# Patient Record
Sex: Female | Born: 1945 | Race: White | Hispanic: No | Smoking: Former smoker
Health system: Southern US, Community
[De-identification: ages and names within clinical notes are randomized; demographics above are authoritative.]

## PROBLEM LIST (undated history)

## (undated) DIAGNOSIS — C50919 Malignant neoplasm of unspecified site of unspecified female breast: Secondary | ICD-10-CM

## (undated) DIAGNOSIS — C3490 Malignant neoplasm of unspecified part of unspecified bronchus or lung: Secondary | ICD-10-CM

## (undated) DIAGNOSIS — C719 Malignant neoplasm of brain, unspecified: Secondary | ICD-10-CM

## (undated) DIAGNOSIS — F419 Anxiety disorder, unspecified: Secondary | ICD-10-CM

## (undated) HISTORY — PX: TONSILLECTOMY: SUR1361

## (undated) HISTORY — PX: EYE SURGERY: SHX253

## (undated) HISTORY — PX: COSMETIC SURGERY: SHX468

## (undated) HISTORY — PX: BREAST SURGERY: SHX581

## (undated) HISTORY — DX: Malignant neoplasm of unspecified site of unspecified female breast: C50.919

---

## 1978-04-25 HISTORY — PX: OTHER SURGICAL HISTORY: SHX169

## 1996-04-25 HISTORY — PX: DILATION AND CURETTAGE OF UTERUS: SHX78

## 1997-04-25 HISTORY — PX: FACELIFT W/BLEPHAROPLASTY: SHX1568

## 1999-12-13 ENCOUNTER — Other Ambulatory Visit: Admission: RE | Admit: 1999-12-13 | Discharge: 1999-12-13 | Payer: Self-pay | Admitting: Obstetrics and Gynecology

## 2000-11-16 ENCOUNTER — Encounter: Payer: Self-pay | Admitting: Obstetrics and Gynecology

## 2000-11-16 ENCOUNTER — Encounter: Admission: RE | Admit: 2000-11-16 | Discharge: 2000-11-16 | Payer: Self-pay | Admitting: Obstetrics and Gynecology

## 2000-12-12 ENCOUNTER — Other Ambulatory Visit: Admission: RE | Admit: 2000-12-12 | Discharge: 2000-12-12 | Payer: Self-pay | Admitting: Obstetrics and Gynecology

## 2001-12-25 ENCOUNTER — Other Ambulatory Visit: Admission: RE | Admit: 2001-12-25 | Discharge: 2001-12-25 | Payer: Self-pay | Admitting: Obstetrics and Gynecology

## 2002-04-09 ENCOUNTER — Encounter: Payer: Self-pay | Admitting: Obstetrics and Gynecology

## 2002-04-09 ENCOUNTER — Encounter: Admission: RE | Admit: 2002-04-09 | Discharge: 2002-04-09 | Payer: Self-pay | Admitting: Obstetrics and Gynecology

## 2003-01-08 ENCOUNTER — Other Ambulatory Visit: Admission: RE | Admit: 2003-01-08 | Discharge: 2003-01-08 | Payer: Self-pay | Admitting: Obstetrics and Gynecology

## 2004-03-23 ENCOUNTER — Other Ambulatory Visit: Admission: RE | Admit: 2004-03-23 | Discharge: 2004-03-23 | Payer: Self-pay | Admitting: Obstetrics and Gynecology

## 2005-04-25 HISTORY — PX: OTHER SURGICAL HISTORY: SHX169

## 2005-04-28 ENCOUNTER — Other Ambulatory Visit: Admission: RE | Admit: 2005-04-28 | Discharge: 2005-04-28 | Payer: Self-pay | Admitting: *Deleted

## 2005-06-08 ENCOUNTER — Encounter: Admission: RE | Admit: 2005-06-08 | Discharge: 2005-06-08 | Payer: Self-pay | Admitting: Obstetrics and Gynecology

## 2006-06-07 ENCOUNTER — Other Ambulatory Visit: Admission: RE | Admit: 2006-06-07 | Discharge: 2006-06-07 | Payer: Self-pay | Admitting: Obstetrics & Gynecology

## 2007-06-11 ENCOUNTER — Other Ambulatory Visit: Admission: RE | Admit: 2007-06-11 | Discharge: 2007-06-11 | Payer: Self-pay | Admitting: Obstetrics & Gynecology

## 2008-06-09 ENCOUNTER — Other Ambulatory Visit: Admission: RE | Admit: 2008-06-09 | Discharge: 2008-06-09 | Payer: Self-pay | Admitting: Obstetrics & Gynecology

## 2008-06-17 ENCOUNTER — Encounter: Admission: RE | Admit: 2008-06-17 | Discharge: 2008-06-17 | Payer: Self-pay | Admitting: Obstetrics & Gynecology

## 2010-08-26 ENCOUNTER — Other Ambulatory Visit: Payer: Self-pay | Admitting: Obstetrics & Gynecology

## 2010-08-26 DIAGNOSIS — Z780 Asymptomatic menopausal state: Secondary | ICD-10-CM

## 2010-08-26 DIAGNOSIS — Z1231 Encounter for screening mammogram for malignant neoplasm of breast: Secondary | ICD-10-CM

## 2010-09-10 ENCOUNTER — Ambulatory Visit
Admission: RE | Admit: 2010-09-10 | Discharge: 2010-09-10 | Disposition: A | Discharge status: Home / Self Care | Payer: 59 | Source: Ambulatory Visit | Attending: Obstetrics & Gynecology | Admitting: Obstetrics & Gynecology

## 2010-09-10 DIAGNOSIS — Z1231 Encounter for screening mammogram for malignant neoplasm of breast: Secondary | ICD-10-CM

## 2010-09-10 DIAGNOSIS — Z780 Asymptomatic menopausal state: Secondary | ICD-10-CM

## 2010-09-15 ENCOUNTER — Other Ambulatory Visit: Payer: Self-pay | Admitting: Obstetrics & Gynecology

## 2010-09-15 DIAGNOSIS — R928 Other abnormal and inconclusive findings on diagnostic imaging of breast: Secondary | ICD-10-CM

## 2010-09-21 ENCOUNTER — Other Ambulatory Visit: Payer: Self-pay | Admitting: Obstetrics & Gynecology

## 2010-09-21 ENCOUNTER — Ambulatory Visit
Admission: RE | Admit: 2010-09-21 | Discharge: 2010-09-21 | Disposition: A | Discharge status: Home / Self Care | Payer: 59 | Source: Ambulatory Visit | Attending: Obstetrics & Gynecology | Admitting: Obstetrics & Gynecology

## 2010-09-21 DIAGNOSIS — R928 Other abnormal and inconclusive findings on diagnostic imaging of breast: Secondary | ICD-10-CM

## 2010-09-24 ENCOUNTER — Ambulatory Visit
Admission: RE | Admit: 2010-09-24 | Discharge: 2010-09-24 | Disposition: A | Discharge status: Home / Self Care | Payer: 59 | Source: Ambulatory Visit | Attending: Obstetrics & Gynecology | Admitting: Obstetrics & Gynecology

## 2010-09-24 ENCOUNTER — Other Ambulatory Visit: Payer: Self-pay | Admitting: Diagnostic Radiology

## 2010-09-24 ENCOUNTER — Other Ambulatory Visit: Payer: Self-pay | Admitting: Obstetrics & Gynecology

## 2010-09-24 DIAGNOSIS — R928 Other abnormal and inconclusive findings on diagnostic imaging of breast: Secondary | ICD-10-CM

## 2010-10-13 ENCOUNTER — Encounter (INDEPENDENT_AMBULATORY_CARE_PROVIDER_SITE_OTHER): Payer: Self-pay | Admitting: General Surgery

## 2011-03-23 ENCOUNTER — Other Ambulatory Visit: Payer: Self-pay | Admitting: Obstetrics & Gynecology

## 2011-03-23 DIAGNOSIS — N63 Unspecified lump in unspecified breast: Secondary | ICD-10-CM

## 2011-04-06 ENCOUNTER — Other Ambulatory Visit: Payer: Self-pay | Admitting: Obstetrics & Gynecology

## 2011-04-06 ENCOUNTER — Ambulatory Visit
Admission: RE | Admit: 2011-04-06 | Discharge: 2011-04-06 | Disposition: A | Discharge status: Home / Self Care | Payer: 59 | Source: Ambulatory Visit | Attending: Obstetrics & Gynecology | Admitting: Obstetrics & Gynecology

## 2011-04-06 DIAGNOSIS — N63 Unspecified lump in unspecified breast: Secondary | ICD-10-CM

## 2011-05-03 ENCOUNTER — Ambulatory Visit
Admission: RE | Admit: 2011-05-03 | Discharge: 2011-05-03 | Disposition: A | Discharge status: Home / Self Care | Payer: 59 | Source: Ambulatory Visit | Attending: Obstetrics & Gynecology | Admitting: Obstetrics & Gynecology

## 2011-05-03 ENCOUNTER — Other Ambulatory Visit: Payer: Self-pay | Admitting: Obstetrics & Gynecology

## 2011-05-03 DIAGNOSIS — N63 Unspecified lump in unspecified breast: Secondary | ICD-10-CM

## 2011-05-04 ENCOUNTER — Other Ambulatory Visit: Payer: Self-pay | Admitting: Obstetrics & Gynecology

## 2011-05-04 DIAGNOSIS — C50912 Malignant neoplasm of unspecified site of left female breast: Secondary | ICD-10-CM

## 2011-05-04 DIAGNOSIS — N6489 Other specified disorders of breast: Secondary | ICD-10-CM

## 2011-05-05 ENCOUNTER — Other Ambulatory Visit: Payer: Self-pay | Admitting: *Deleted

## 2011-05-05 ENCOUNTER — Telehealth: Payer: Self-pay | Admitting: *Deleted

## 2011-05-05 DIAGNOSIS — C50412 Malignant neoplasm of upper-outer quadrant of left female breast: Secondary | ICD-10-CM | POA: Insufficient documentation

## 2011-05-05 DIAGNOSIS — C50419 Malignant neoplasm of upper-outer quadrant of unspecified female breast: Secondary | ICD-10-CM

## 2011-05-05 NOTE — Telephone Encounter (Signed)
Confirmed [facility] for 05/11/11 at 1215 .  Instructions and contact information given.  

## 2011-05-06 ENCOUNTER — Encounter (INDEPENDENT_AMBULATORY_CARE_PROVIDER_SITE_OTHER): Payer: Self-pay | Admitting: General Surgery

## 2011-05-09 ENCOUNTER — Other Ambulatory Visit: Payer: 59

## 2011-05-11 ENCOUNTER — Encounter: Payer: Self-pay | Admitting: Oncology

## 2011-05-11 ENCOUNTER — Other Ambulatory Visit (HOSPITAL_BASED_OUTPATIENT_CLINIC_OR_DEPARTMENT_OTHER): Payer: 59 | Admitting: Lab

## 2011-05-11 ENCOUNTER — Encounter (INDEPENDENT_AMBULATORY_CARE_PROVIDER_SITE_OTHER): Payer: Self-pay | Admitting: General Surgery

## 2011-05-11 ENCOUNTER — Ambulatory Visit: Payer: 59

## 2011-05-11 ENCOUNTER — Ambulatory Visit (HOSPITAL_BASED_OUTPATIENT_CLINIC_OR_DEPARTMENT_OTHER): Payer: 59 | Admitting: General Surgery

## 2011-05-11 ENCOUNTER — Ambulatory Visit
Admission: RE | Admit: 2011-05-11 | Discharge: 2011-05-11 | Disposition: A | Discharge status: Home / Self Care | Payer: 59 | Source: Ambulatory Visit | Attending: Radiation Oncology | Admitting: Radiation Oncology

## 2011-05-11 ENCOUNTER — Ambulatory Visit (HOSPITAL_BASED_OUTPATIENT_CLINIC_OR_DEPARTMENT_OTHER): Payer: 59 | Admitting: Oncology

## 2011-05-11 ENCOUNTER — Ambulatory Visit: Payer: 59 | Attending: General Surgery | Admitting: Physical Therapy

## 2011-05-11 VITALS — BP 160/93 | HR 76 | Temp 98.2°F | Ht 69.0 in | Wt 176.6 lb

## 2011-05-11 DIAGNOSIS — C50419 Malignant neoplasm of upper-outer quadrant of unspecified female breast: Secondary | ICD-10-CM

## 2011-05-11 DIAGNOSIS — Z170 Estrogen receptor positive status [ER+]: Secondary | ICD-10-CM

## 2011-05-11 DIAGNOSIS — IMO0001 Reserved for inherently not codable concepts without codable children: Secondary | ICD-10-CM | POA: Insufficient documentation

## 2011-05-11 DIAGNOSIS — C50919 Malignant neoplasm of unspecified site of unspecified female breast: Secondary | ICD-10-CM | POA: Insufficient documentation

## 2011-05-11 LAB — COMPREHENSIVE METABOLIC PANEL
Albumin: 3.8 g/dL (ref 3.5–5.2)
BUN: 13 mg/dL (ref 6–23)
Calcium: 9.6 mg/dL (ref 8.4–10.5)
Chloride: 106 mEq/L (ref 96–112)
Glucose, Bld: 104 mg/dL — ABNORMAL HIGH (ref 70–99)
Potassium: 4.2 mEq/L (ref 3.5–5.3)
Sodium: 141 mEq/L (ref 135–145)
Total Protein: 7.5 g/dL (ref 6.0–8.3)

## 2011-05-11 LAB — CBC WITH DIFFERENTIAL/PLATELET
Basophils Absolute: 0 10*3/uL (ref 0.0–0.1)
Eosinophils Absolute: 0 10*3/uL (ref 0.0–0.5)
HGB: 13 g/dL (ref 11.6–15.9)
MONO#: 0.3 10*3/uL (ref 0.1–0.9)
NEUT#: 2.8 10*3/uL (ref 1.5–6.5)
RBC: 3.99 10*6/uL (ref 3.70–5.45)
RDW: 13.5 % (ref 11.2–14.5)
WBC: 4.7 10*3/uL (ref 3.9–10.3)
lymph#: 1.5 10*3/uL (ref 0.9–3.3)

## 2011-05-11 LAB — CANCER ANTIGEN 27.29: CA 27.29: 21 U/mL (ref 0–39)

## 2011-05-11 MED ORDER — ANASTROZOLE 1 MG PO TABS: 1.0000 mg | ORAL_TABLET | Freq: Every day | ORAL | Status: DC

## 2011-05-11 NOTE — Progress Notes (Signed)
Patient ID: KENYON ESHLEMAN, female   DOB: August 08, 1945, 66 y.o.   MRN: [id number]  Chief Complaint  Patient presents with  . Other    Breast Cancer    HPI Nikcole R Dress is a 66 y.o. female.  Referred by Dr. Anselmo Pickler HPI This is a 66 year old female who presented about 6 months ago with an abnormal mammogram. 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 She was referred for surgical excision at that point but she declined. She did however follow-up for her 6 month follow-up mammogram. She still had a upper outer quadrant left breast firm mass. Ultrasound showed a 2.4 x 2.1 x 1.2 cm mass. She underwent an additional biopsy at this time which showed an invasive ductal carcinoma. This is progesterone and estrogen receptor positive at 100%, proliferation marker is 8%, and HER-2/neu is not amplified. She comes in today to multidisciplinary clinic to discuss her options for treatment of her breast cancer. She has no specific complaints referable to her breast today.  History reviewed. No pertinent past medical history.  Past Surgical History  Procedure Date  . Eye surgery 1999    blepheroplasty   . Tonsillectomy   . Maxillofacial surgery   . Facial cosmetic surgery     Family History  Problem Relation Age of Onset  . Cancer Mother   . Colon cancer Mother   . Cancer Father   . Colon cancer Father     Social History History  Substance Use Topics  . Smoking status: Former Smoker -- 2.0 packs/day    Quit date: 04/26/2011  . Smokeless tobacco: Never Used  . Alcohol Use: 1.2 oz/week    2 Cans of beer per week    Allergies  Allergen Reactions  . Codeine Nausea And Vomiting  . Penicillins Rash  . Sulfa Antibiotics Rash    Current Outpatient Prescriptions  Medication Sig Dispense Refill  . anastrozole (ARIMIDEX) 1 MG tablet Take 1 tablet (1 mg total) by mouth daily.  30 tablet  12  . medroxyPROGESTERone (PROVERA) 5 MG tablet Take 5 mg  by mouth. Twice monthly         Review of Systems Review of Systems  Constitutional: Negative for fever, chills and unexpected weight change.  HENT: Negative for hearing loss, congestion, sore throat, trouble swallowing and voice change.   Eyes: Negative for visual disturbance.  Respiratory: Negative for cough and wheezing.   Cardiovascular: Negative for chest pain, palpitations and leg swelling.  Gastrointestinal: Negative for nausea, vomiting, abdominal pain, diarrhea, constipation, blood in stool, abdominal distention and anal bleeding.  Genitourinary: Negative for hematuria, vaginal bleeding and difficulty urinating.  Musculoskeletal: Negative for arthralgias.  Skin: Negative for rash and wound.  Neurological: Negative for seizures, syncope and headaches.  Hematological: Negative for adenopathy. Does not bruise/bleed easily.  Psychiatric/Behavioral: Negative for confusion.    Wt Readings from Last 3 Encounters:  05/11/11 176 lb 9.6 oz (80.105 kg)   Temp Readings from Last 3 Encounters:  05/11/11 98.2 F (36.8 C) Oral   BP Readings from Last 3 Encounters:  05/11/11 160/93   Pulse Readings from Last 3 Encounters:  05/11/11 76    Physical Exam Physical Exam  Constitutional: She appears well-developed and well-nourished.  Neck: Neck supple.  Cardiovascular: Normal rate, regular rhythm and normal heart sounds.   Pulmonary/Chest: Effort normal and breath sounds normal. She has no wheezes. She has no rales. Right breast exhibits no inverted nipple, no mass, no  nipple discharge, no skin change and no tenderness. Left breast exhibits no inverted nipple, no mass, no nipple discharge, no skin change (hematoma from biopsy) and no tenderness. Breasts are symmetrical.    Abdominal: Soft. There is no hepatomegaly.  Lymphadenopathy:    She has no cervical adenopathy.    Data Reviewed DIGITAL DIAGNOSTIC LEFT BREAST MAMMOGRAM WITH CAD AND LEFT BREAST  ULTRASOUND:  Comparison:  09/24/2010, 09/21/2010, 09/10/2010, 06/17/2008, and  06/08/2005.  Findings: There is an area of distortion located within the upper-  outer quadrant of the left breast which appears more prominent than  on the prior studies. There is an adjacent clip from the previous  left breast ultrasound guided core biopsy.  Mammographic images were processed with CAD.  On physical exam, there is a palpable, firm mass located within the  upper-outer quadrant of the left breast 4 cm from the nipple which  by physical examination measures approximately 2.5-3 cm in size.  There is no palpable left axillary adenopathy.  Ultrasound is performed, showing an irregular hypoechoic mass with  shadowing located within the left breast at the 2 o'clock position  4 cm from the nipple which measures 2.4 x 2.1 by 1.2 cm in size.  The findings were discussed with the patient. I do recommend  surgical excision of the area. However, the patient declines  surgical excision but will consent to repeat ultrasound guided core  biopsy of the area. If this is malignant, she will consent to  excision/ treatment.  IMPRESSION:  There is worsening distortion within the upper-outer quadrant of  the left breast worrisome for possible invasive mammary carcinoma  with discordant pathology noted on the previous ultrasound guided  core biopsy. Surgical excision was recommended. However, the  patient has declined surgical excision. As discussed above,  recommend repeat left breast core biopsy.  BI-RADS CATEGORY 4: Suspicious abnormality - biopsy should be  considered.   Assessment   Clinical stage II left breast cancer    Plan     We discussed the staging and pathophysiology of breast cancer. We discussed all of the different options for treatment for breast cancer including surgery, chemotherapy, radiation therapy, Herceptin, and antiestrogen therapy.  I think this area may be a little too large for lumpectomy right now or  right on the borderline.  If we could downsize this with neoadjuvant endocrine therapy then she may be undergo a lumpectomy with better cosmetic result. We did discuss surgical options today as listed below.  We discussed a sentinel lymph node biopsy as she does not appear to having lymph node involvement right now. We discussed the performance of that with injection of radioactive tracer and blue dye. We discussed that she would have an incision underneath her axillary hairline. We discussed that there is a bout a 10-20% chance of having a positive node with a sentinel lymph node biopsy and we will await the permanent pathology to make any other first further decisions in terms of her treatment. One of these options might be to return to the operating room to perform an axillary lymph node dissection. We discussed about a 1-2% risk lifetime of chronic shoulder pain as well as lymphedema associated with a sentinel lymph node biopsy.  We discussed the options for treatment of the breast cancer which included lumpectomy versus a mastectomy. We discussed the performance of the lumpectomy with a wire placement. We discussed a 10-20% chance of a positive margin requiring reexcision in the operating room. We also discussed  that she may need radiation therapy or antiestrogen therapy or both if she undergoes lumpectomy. We discussed the mastectomy and the postoperative care for that as well. We discussed that there is no difference in her survival whether she undergoes lumpectomy with radiation therapy or antiestrogen therapy versus a mastectomy. There is a slight difference in the local recurrence rate being 3-5% with lumpectomy and about 1% with a mastectomy. I will plan on seeing back in about four months.  Will plan on repeating in six months.       Treyvonne Tata 05/11/2011, 4:06 PM

## 2011-05-11 NOTE — Patient Instructions (Signed)

## 2011-05-11 NOTE — Progress Notes (Signed)
HPI:  Kelly Stuart is a very pleasant 66 year old female who is seen out of the courtesy of Dr. Eulis Foster through the [facility].  Last spring, Kelly Stuart was noted have an irregular mass in the 2 o'clock position of the left breast.  It was recommended the patient proceed with a biopsy of this area.  On September 24, 2010, the patient underwent needle core biopsy of the left breast which revealed pseudoangiomatous stromal hyperplasia (PASH).  The patient returned in December 2012 for repeat mammogram which revealed worsening distortion within the upper-outer quadrant of the left breast worrisome for malignancy.  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  There were associated calcifications identified within the biopsy specimen.  Overall findings were compatible with a grade 1 breast cancer.  The tumor was estrogen receptor positive at 100% and progesterone receptor positive at 100%. Proliferation marker was low at 8%.  There was no HER-2/neu amplification noted.  With the above findings, it was recommended that the patient proceed with MRI of the chest/breast area which the patient had initially declined. The patient is now seen as part of the [facility].  PAST MEDICAL HISTORY:  The patient has an allergy to penicillin which causes hives and to sulfa medications which also causes hives.  The patient is also intolerant of codeine with nausea and actinomycin which causes hives.  The patient has been on hormone replacement therapy up until recently, but she has decided to discontinue this medication in light of her diagnosis.  Prior surgeries include a face lift and maxillofacial surgery.  The patient has no chronic medical problems.  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  The patient has a prior history of tobacco use smoking approximately 2 packs per day.  The patient has quit.  The patient drinks 2-3 beers daily.  FAMILY HISTORY:  Significant for significant for mother with history of colon cancer at age 4 and father with history of colon cancer at age 93.  REVIEW OF SYSTEMS:  Prior to biopsy, the patient denied any pain in the breast area, nipple discharge, or bleeding.  The patient denies any new bony pain, headaches, dizziness, or blurred vision.  PHYSICAL EXAMINATION:  General:  A very pleasant 66 year old female in no acute distress.  Vital Signs:  Temperature 98.2, pulse 76, respirations 20, blood pressure is 160/93 and I have recommended the patient follow up with her primary care physician concerning this elevated blood pressure.  Patient's weight is 176 pounds.  Examination of the neck and supraclavicular region reveals no evidence of adenopathy.  The axillary areas are free of adenopathy.  Examination of the lungs reveals them to be clear.  Heart:  Regular rhythm and rate. Abdomen:  Soft and nontender with normal bowel sounds.  Examination of right breast reveals no mass or nipple discharge.  Examination of the left breast reveals a mass in the upper-outer quadrant which is estimated to be approximately 3 x 3.5 cm in size.  This is located approximately 2 cm from the areolar border.  There is no obvious skin involvement or chest wall fixation.  There are no other masses palpable within the left breast, nipple discharge, or bleeding.  IMPRESSION/PLAN:  Invasive ductal carcinoma of the left breast.  Today, I talked with Kelly Stuart concerning options  for treatment including breast conserving therapy and mastectomy.  Given the size of the patient's lesion, it would be recommended the patient proceed with neoadjuvant treatment.  This may potentially improve the chances for a better outcome with partial mastectomy with tumor  shrinkage.  The patient has met with Dr. Welton Flakes who does feel the patient is a candidate for neoadjuvant hormonal therapy.  At this time, Kelly Stuart also does wish to proceed with the MRI as part of her workup.  At this point, the patient does seem to be receptive in proceeding with breast conserving therapy and as above will proceed with neoadjuvant treatment.  Final details concerning the patient's management surgically and in terms of radiation therapy will depend on her response to neoadjuvant treatment.    ______________________________ Billie Lade, Ph.D., M.D. JDK/MEDQ  D:  05/11/2011  T:  05/11/2011  Job:  [id number]

## 2011-05-16 ENCOUNTER — Other Ambulatory Visit: Payer: Self-pay | Admitting: Oncology

## 2011-05-16 DIAGNOSIS — C50919 Malignant neoplasm of unspecified site of unspecified female breast: Secondary | ICD-10-CM

## 2011-05-17 ENCOUNTER — Telehealth: Payer: Self-pay | Admitting: Oncology

## 2011-05-17 ENCOUNTER — Telehealth: Payer: Self-pay | Admitting: *Deleted

## 2011-05-17 NOTE — Telephone Encounter (Signed)
Spoke to pt concerning [facility] appt from 05/11/11.  Confirmed bilateral breast MRI for 05/21/11 and f/u appt with Dr. Welton Flakes for 06/08/11.  Pt relate no questions pertaining to dx or treatment care plan.  Pt did enquire about doing Botox and micro-dermabrasion while taking  Arimidex.  Informed pt that I would speak with Dr. Welton Flakes and inform her of her response.  Encourage pt to call with needs.  Received verbal understanding.  Contact information given.

## 2011-05-17 NOTE — Telephone Encounter (Signed)
S/w the pt and she is aware of her mri breast appt at [facility] on 05/21/2011@3 :00pm

## 2011-05-18 ENCOUNTER — Encounter: Payer: Self-pay | Admitting: *Deleted

## 2011-05-18 NOTE — Progress Notes (Signed)
Mailed after appt letter to pt. 

## 2011-05-18 NOTE — Progress Notes (Signed)
Kelly Stuart [id number] [birth year] 66 y.o. 05/18/2011 11:16 PM  CC Dr. Eulis Foster Dr. Antony Blackbird Dr. Emelia Loron  REASON FOR CONSULTATION:  66 year old female with new diagnosis invasive ductal carcinoma of the left breast. The tumor was grade 1 estrogen receptor +100% progesterone receptor +100% proliferation marker 8% HER-2/neu negative. Patient was seen in the Multidisciplinary Breast Clinic for discussion of her treatment options.   STAGE:  Cancer of upper-outer quadrant of female breast   Primary site: Breast (Left)   Staging method: AJCC 7th Edition   Clinical free text: T2 N0 M0   Clinical: Stage IIA   Summary: Stage IIA  REFERRING PHYSICIAN: Dr. Emelia Loron  HISTORY OF PRESENT ILLNESS:  Kelly Stuart is a 66 y.o. female without significant past medical history who began having screening mammograms in her 107s. Back in June 2012 the patient was found to have an irregular mass in the 2:00 position of the left breast was recommended the patient proceed with a biopsy of this area. Therefore she underwent a needle core biopsy of the left breast that revealed pseudo-angiomata stromal hyperplasia( adenosis PASH). The patient was recommended complete resection but she declined. But as followup in December 2012 patient had a mammogram performed that revealed worsening distortion in the upper outer quadrant of the left breast worrisome for a malignancy. 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 There were associated calcifications in the biopsy specimen. Pathology revealed a grade 1 invasive ductal carcinoma ER positive PR positive HER-2/neu negative with a proliferation marker low at 8%. As part of further workup patient was scheduled to have MRI of the breasts performed on Monday, January 11 but patient declined. She states that she wanted to wait to see what would happen in the  multidisciplinary breast clinic before she made any decisions regarding a breast MRI. Patient is now seen in the multidisciplinary clinic for discussion of her treatment options. She is without any complaints.   Past Medical History: History reviewed. No pertinent past medical history.  Past Surgical History: Past Surgical History  Procedure Date  . Eye surgery 1999    blepheroplasty   . Tonsillectomy   . Maxillofacial surgery   . Facial cosmetic surgery     Family History: Family History  Problem Relation Age of Onset  . Cancer Mother   . Colon cancer Mother   . Cancer Father   . Colon cancer Father     Social History patient is divorced she is a Corporate investment banker and a Teacher, adult education. She has one 16 year old adopted son. History  Substance Use Topics  . Smoking status: Former Smoker -- 2.0 packs/day    Quit date: 04/26/2011  . Smokeless tobacco: Never Used  . Alcohol Use: 1.2 oz/week    2 Cans of beer per week    Allergies: Allergies  Allergen Reactions  . Codeine Nausea And Vomiting  . Penicillins Rash  . Sulfa Antibiotics Rash    Current Medications: Current Outpatient Prescriptions  Medication Sig Dispense Refill  . anastrozole (ARIMIDEX) 1 MG tablet Take 1 tablet (1 mg total) by mouth daily.  30 tablet  12  . medroxyPROGESTERone (PROVERA) 5 MG tablet Take 5 mg by mouth. Twice monthly         OB/GYN History: Menarche at age 30 patient underwent menopause in her mid 16s. She has been on hormone replacement therapy with a combination for at least 20 years. Patient  has never had any children she is G0 P0.  Fertility Discussion: N/A Prior History of Cancer: Patient has no previous history of cancers  Health Maintenance:  Colonoscopy colonoscopy was performed in May 2012 Bone Density bone density 2012 Last PAP smear 2000  ECOG PERFORMANCE STATUS: 0 - Asymptomatic  Genetic Counseling/testing: Patient has no significant family history of breast or ovarian.  Patient's father died at the age of 40 mother died at the age of 22 patient has 3 sisters 58 and 52. Mom had colon cancer at 51 dad colon cancer at 61. Genetic testing and counseling is not recommended at this time, unless her family history changes later  REVIEW OF SYSTEMS:  Constitutional: negative Ears, nose, mouth, throat, and face: negative Respiratory: negative Cardiovascular: negative Gastrointestinal: negative Genitourinary:negative Integument/breast: positive for breast lump, breast tenderness and Area of ecchymosis Hematologic/lymphatic: negative Musculoskeletal:negative Neurological: negative Behavioral/Psych: negative  PHYSICAL EXAMINATION: Blood pressure 160/93, pulse 76, temperature 98.2 F (36.8 C), temperature source Oral, height 5\' 9"  (1.753 m), weight 176 lb 9.6 oz (80.105 kg).  ZOX:WRUEA, healthy, no distress, well nourished, well developed and anxious SKIN: skin color, texture, turgor are normal HEAD: No masses, lesions, tenderness or abnormalities EYES: PERRLA, EOMI, Conjunctiva are pink and non-injected, sclera clear EARS: External ears normal OROPHARYNX:no exudate, no erythema, lips, buccal mucosa, and tongue normal and dentition normal  NECK: supple, no adenopathy, no bruits, no JVD, thyroid normal size, non-tender, without nodularity LYMPH:  no palpable lymphadenopathy, no hepatosplenomegaly BREAST: Bilateral breasts are examined. Right breast reveals no masses or nipple discharge. Left breast does reveal a palpable mass with an areas of ecchymosis there LUNGS: clear to auscultation and percussion HEART: regular rate & rhythm, no murmurs, no gallops, S1 normal and S2 normal ABDOMEN:abdomen soft, non-tender, normal bowel sounds and no masses or organomegaly BACK: Back symmetric, no curvature., No CVA tenderness EXTREMITIES:no edema, no clubbing, no cyanosis  NEURO: alert & oriented x 3 with fluent speech, no focal motor/sensory deficits, gait normal,  reflexes normal and symmetric  STUDIES/RESULTS: [place] Core Biopsy  May 07, 2011  **ADDENDUM** CREATED: 05/07/2011 16:22:31  The pathology demonstrated invasive ductal carcinoma and DCIS felt to be grade 1.  This is felt to be concordant with the imaging findings.  I discussed the findings with the patient by telephone and answered her questions.  The patient states that her biopsy site is clean and dry without hematoma formation.    Although initially  during her pre biopsy consultation,  the patient stated that she would be willing to undergo therapy if a cancer diagnosis was made, she is now reticent to undergo standard treatment.  I initially arranged a consultation with Dr. Derrell Lolling who had seen her previously. She declined this. I also mentioned that we have a multidisciplinary clinic and that she could be seen at that clinic ( stressing that she should make an informed decision prior to declining standard treatment). She agreed to consultation at the multidisciplinary clinic. She has declined preoperative breast MRI. She has been scheduled for the [facility] on 05/11/2011.  Post biopsy wound care instructions were reviewed with the patient.  She was encouraged to call the Breast Center for additional questions or concerns.  Addended by:  Rolla Plate, M.D. on 05-07-11 16:22:31.  **END ADDENDUM** SIGNED BY: Rolla Plate, M.D.    May 07, 2011  *RADIOLOGY REPORT*  Clinical Data:  Repeat biopsy of distortion/mass within the upper- outer quadrant of the left breast located at the 2 o'clock position.  ULTRASOUND GUIDED  CORE BIOPSY OF THE left BREAST  I met with the patient and we discussed the procedure of ultrasound- guided biopsy, including benefits and alternatives.  We discussed the high likelihood of a successful procedure. We discussed the risks of the procedure, including infection, bleeding, tissue injury, clip migration, and inadequate sampling.  Informed written consent was given.  Using sterile technique 2%  lidocaine, ultrasound guidance and a 14 gauge automated biopsy device, biopsy was performed of the mass with distortion located within the left breast at the 2 o'clock position.  At the conclusion of the procedure a ribbon shaped tissue marker clip was deployed into the biopsy cavity.  Follow up 2 view mammogram was performed and dictated separately.  IMPRESSION: Ultrasound guided biopsy of the left breast mass located at the 2 o'clock position as discussed above.  No apparent complications.  Original Report Authenticated By: Rolla Plate, M.D.   Mm Digital Diagnostic Unilat L  05/03/2011  *RADIOLOGY REPORT*  Clinical Data:  Post left breast ultrasound guided core biopsy.  DIGITAL DIAGNOSTIC LEFT BREAST MAMMOGRAM  Comparison:  Prior studies  Findings:  Films are performed following ultrasound guided biopsy of the mass with distortion located at the 2 o'clock position within the left breast.  The ribbon shaped clip is located centrally within the area of the mass / distortion and is located approximately 8 mm posterior and superior to the clip placed on the previous ultrasound guided core biopsy dated 09/24/2010.  IMPRESSION: Appropriate positioning of clip following left breast ultrasound guided core biopsy.  Original Report Authenticated By: Rolla Plate, M.D.     LABS:    Chemistry      Component Value Date/Time   NA 141 05/11/2011 1249   K 4.2 05/11/2011 1249   CL 106 05/11/2011 1249   CO2 27 05/11/2011 1249   BUN 13 05/11/2011 1249   CREATININE 0.78 05/11/2011 1249      Component Value Date/Time   CALCIUM 9.6 05/11/2011 1249   ALKPHOS 48 05/11/2011 1249   AST 17 05/11/2011 1249   ALT 13 05/11/2011 1249   BILITOT 0.5 05/11/2011 1249      Lab Results  Component Value Date   WBC 4.7 05/11/2011   HGB 13.0 05/11/2011   HCT 38.6 05/11/2011   MCV 96.9 05/11/2011   PLT 241 05/11/2011    ASSESSMENT    66 year old female with new diagnosis of low-grade T2 NX MX invasive ductal carcinoma of  the left breast measuring 2.8 cm by ultrasound. The final path pathology revealed ER positive PR positive HER-2/neu negative invasive ductal carcinoma with Ki 67 8%. The patient is seen in oncology for consideration of treatment options. Of note patient is still on hormone replacement therapy on a daily basis.  PLAN:    Patient was seen in the multidisciplinary clinic today with Dr. Emelia Loron and Dr. Phillis Knack. Recommendation was made regarding treatment options. Patient certainly is recommended MRI of the breasts were fat further evaluation and extent of the extent of disease as well as evaluation of the contralateral breast. Patient is now agreeable to this and she will have her MRI done and scheduled patient was recommended neoadjuvant antiestrogen therapy as she is very much interested in breast conservation. I have recommended Arimidex 1 mg. She will receive at least 6 months of Arimidex and that we would repeat an MRI to evaluate for response. Risks and benefits of Arimidex were discussed with the patient which include but are not limited to hot flashes accelerated  osteoporosis, joint aches and pains and liver abnormalities. She was given a prescription for Arimidex and she will have this filled and started as soon as possible. In the meantime I will plan on seeing her back in about 8-12 weeks time.      Thank you so much for allowing me to participate in the care of Korrin R Dittman. I will continue to follow up the patient with you and assist in her care.  All questions were answered. The patient knows to call the clinic with any problems, questions or concerns. We can certainly see the patient much sooner if necessary.  I spent 40 minutes counseling the patient face to face. The total time spent in the appointment was 60 minutes.  Drue Second, MD Medical/Oncology [facility] [phone] (beeper) [phone] (Office)  05/18/2011, 11:16 PM 05/18/2011, 11:16  PM

## 2011-05-21 ENCOUNTER — Inpatient Hospital Stay (HOSPITAL_COMMUNITY): Admission: RE | Admit: 2011-05-21 | Payer: 59 | Source: Ambulatory Visit

## 2011-05-23 ENCOUNTER — Ambulatory Visit (HOSPITAL_COMMUNITY)
Admission: RE | Admit: 2011-05-23 | Discharge: 2011-05-23 | Disposition: A | Discharge status: Home / Self Care | Payer: 59 | Source: Ambulatory Visit | Attending: Oncology | Admitting: Oncology

## 2011-05-23 DIAGNOSIS — C50919 Malignant neoplasm of unspecified site of unspecified female breast: Secondary | ICD-10-CM

## 2011-05-23 DIAGNOSIS — C50419 Malignant neoplasm of upper-outer quadrant of unspecified female breast: Secondary | ICD-10-CM | POA: Insufficient documentation

## 2011-05-23 MED ORDER — GADOBENATE DIMEGLUMINE 529 MG/ML IV SOLN
20.0000 mL | Freq: Once | INTRAVENOUS | Status: AC | PRN
  Administered 2011-05-23: 16 mL via INTRAVENOUS

## 2011-05-24 ENCOUNTER — Ambulatory Visit
Admission: RE | Admit: 2011-05-24 | Discharge: 2011-05-24 | Disposition: A | Discharge status: Home / Self Care | Payer: 59 | Source: Ambulatory Visit | Attending: Obstetrics & Gynecology | Admitting: Obstetrics & Gynecology

## 2011-05-24 DIAGNOSIS — N6489 Other specified disorders of breast: Secondary | ICD-10-CM

## 2011-05-30 ENCOUNTER — Telehealth: Payer: Self-pay | Admitting: *Deleted

## 2011-05-30 NOTE — Telephone Encounter (Signed)
Pt called states " I've been going to [facility] and I have already paid for facial rejuvinations/botox. Is it okay to conitnue to have these things done while taking Arimidex. They told me to check with Dr. Welton Flakes prior to having anything else done."  Pt currently taking Arimidex 1mg  daily.  Next   F/u with Lab/MD 06/08/11 Informed pt I will inform MD regarding her concerns.

## 2011-05-30 NOTE — Telephone Encounter (Signed)
Notified pt, Ok to continue Facials/Botox- per MD, MRI from 01/29 does not show anything more than we already know. She will discuss this more at next F/U visit with Pt. Pt verbalized understanding and thanked me for the call.

## 2011-06-06 ENCOUNTER — Other Ambulatory Visit: Payer: Self-pay | Admitting: *Deleted

## 2011-06-06 DIAGNOSIS — C50419 Malignant neoplasm of upper-outer quadrant of unspecified female breast: Secondary | ICD-10-CM

## 2011-06-06 MED ORDER — ANASTROZOLE 1 MG PO TABS: 1.0000 mg | ORAL_TABLET | Freq: Every day | ORAL | Status: AC

## 2011-06-07 ENCOUNTER — Other Ambulatory Visit (HOSPITAL_BASED_OUTPATIENT_CLINIC_OR_DEPARTMENT_OTHER): Payer: 59 | Admitting: Lab

## 2011-06-07 DIAGNOSIS — C50419 Malignant neoplasm of upper-outer quadrant of unspecified female breast: Secondary | ICD-10-CM

## 2011-06-07 LAB — COMPREHENSIVE METABOLIC PANEL
Albumin: 4.3 g/dL (ref 3.5–5.2)
BUN: 14 mg/dL (ref 6–23)
CO2: 26 mEq/L (ref 19–32)
Calcium: 9.1 mg/dL (ref 8.4–10.5)
Chloride: 103 mEq/L (ref 96–112)
Glucose, Bld: 103 mg/dL — ABNORMAL HIGH (ref 70–99)
Potassium: 4.4 mEq/L (ref 3.5–5.3)
Total Protein: 7.4 g/dL (ref 6.0–8.3)

## 2011-06-07 LAB — CBC WITH DIFFERENTIAL/PLATELET
Basophils Absolute: 0 10*3/uL (ref 0.0–0.1)
Eosinophils Absolute: 0 10*3/uL (ref 0.0–0.5)
HGB: 13.5 g/dL (ref 11.6–15.9)
MCV: 96.7 fL (ref 79.5–101.0)
MONO#: 0.5 10*3/uL (ref 0.1–0.9)
MONO%: 7.6 % (ref 0.0–14.0)
NEUT#: 4.2 10*3/uL (ref 1.5–6.5)
RDW: 13.3 % (ref 11.2–14.5)
WBC: 6.4 10*3/uL (ref 3.9–10.3)
lymph#: 1.6 10*3/uL (ref 0.9–3.3)

## 2011-06-08 ENCOUNTER — Encounter: Payer: Self-pay | Admitting: Oncology

## 2011-06-08 ENCOUNTER — Ambulatory Visit (HOSPITAL_BASED_OUTPATIENT_CLINIC_OR_DEPARTMENT_OTHER): Payer: 59 | Admitting: Oncology

## 2011-06-08 ENCOUNTER — Other Ambulatory Visit: Payer: 59 | Admitting: Lab

## 2011-06-08 VITALS — BP 138/85 | HR 90 | Temp 98.7°F | Ht 69.0 in | Wt 179.3 lb

## 2011-06-08 DIAGNOSIS — C50419 Malignant neoplasm of upper-outer quadrant of unspecified female breast: Secondary | ICD-10-CM

## 2011-06-08 NOTE — Progress Notes (Signed)
OFFICE PROGRESS NOTE  CC Dr. Eulis Foster  Dr. Antony Blackbird  Dr. Emelia Loron  DIAGNOSIS:  66 year old female with new diagnosis invasive ductal carcinoma of the left breast. The tumor was grade 1 estrogen receptor +100% progesterone receptor +100% proliferation marker 8% HER-2/neu negative  PRIOR THERAPY:  1. S/P core needle biopsy with the pathology showing a grade 1 ER+ IDC (T2N0) of the lest breast  2. Begun on neoadjuvant arimidex 1 mg daily since 05/18/11  CURRENT THERAPY: arimidex 1 mg daily since May 18, 2011  INTERVAL HISTORY: Kelly Stuart 66 y.o. female returns for follow up visit. Overall she is doing well. Patient began Arimidex 1 mg daily starting in January 2013. She states that she is tolerating it well except for hot flashes. She is off of all of her hormone replacement therapy including estrogen and Provera. She denies any nausea vomiting fevers chills night sweats headaches no shortness of breath no chest pains or palpitations. She denies having any myalgias or arthralgias. She continues to work as a Teacher, adult education. Patient does tell me that she is planning on going to a complimentary medicine practitioner in the [place] area. She is gong to have acupuncture performed as well as have some herbal medications. Especially for her which will not contain any hormones. Remainder of the 10 point review of systems is negative.  MEDICAL HISTORY:No past medical history on file.  ALLERGIES:  is allergic to codeine; penicillins; and sulfa antibiotics.  MEDICATIONS:  Current Outpatient Prescriptions  Medication Sig Dispense Refill  . anastrozole (ARIMIDEX) 1 MG tablet Take 1 tablet (1 mg total) by mouth daily.  90 tablet  3  . medroxyPROGESTERone (PROVERA) 5 MG tablet Take 5 mg by mouth. Twice monthly         SURGICAL HISTORY:  Past Surgical History  Procedure Date  . Eye surgery 1999    blepheroplasty   . Tonsillectomy   . Maxillofacial surgery   .  Facial cosmetic surgery     REVIEW OF SYSTEMS:  Pertinent items are noted in HPI.   PHYSICAL EXAMINATION: General appearance: alert, cooperative and appears stated age Lymph nodes: Cervical, supraclavicular, and axillary nodes normal. Resp: clear to auscultation bilaterally and normal percussion bilaterally Cardio: regular rate and rhythm, S1, S2 normal, no murmur, click, rub or gallop GI: soft, non-tender; bowel sounds normal; no masses,  no organomegaly Extremities: extremities normal, atraumatic, no cyanosis or edema Neurologic: Alert and oriented X 3, normal strength and tone. Normal symmetric reflexes. Normal coordination and gait Bilateral breast examination is performed: Right breast no masses nipple discharge or palpable masses or skin changes or nipple inversion. Left breast does reveal still a palpable mass but it does seems to be softer. ECOG PERFORMANCE STATUS: 0 - Asymptomatic  Blood pressure 138/85, pulse 90, temperature 98.7 F (37.1 C), temperature source Oral, height 5\' 9"  (1.753 m), weight 179 lb 4.8 oz (81.33 kg).  LABORATORY DATA: Lab Results  Component Value Date   WBC 6.4 06/07/2011   HGB 13.5 06/07/2011   HCT 39.4 06/07/2011   MCV 96.7 06/07/2011   PLT 245 06/07/2011      Chemistry      Component Value Date/Time   NA 140 06/07/2011 1024   K 4.4 06/07/2011 1024   CL 103 06/07/2011 1024   CO2 26 06/07/2011 1024   BUN 14 06/07/2011 1024   CREATININE 0.78 06/07/2011 1024      Component Value Date/Time   CALCIUM 9.1 06/07/2011 1024  ALKPHOS 49 06/07/2011 1024   AST 20 06/07/2011 1024   ALT 17 06/07/2011 1024   BILITOT 0.7 06/07/2011 1024       RADIOGRAPHIC STUDIES:  Mr Breast Bilateral W Wo Contrast  05/24/2011  *RADIOLOGY REPORT*  Clinical Data: Recently diagnosed left breast invasive ductal carcinoma and ductal carcinoma in situ.  The patient has been receiving neoadjuvant anti-estrogen therapy.  BILATERAL BREAST MRI WITH AND WITHOUT CONTRAST  Technique:  Multiplanar, multisequence MR images of both breasts were obtained prior to and following the intravenous administration of 16ml of MultiHance.  Three dimensional images were evaluated at the independent DynaCad workstation.  Comparison:  Recent and previous mammogram, ultrasound and biopsy examinations.  Findings: Minimal background parenchymal enhancement in both breasts.  1.5 x 1.4 x 1.1 cm irregular enhancing mass in the posterior aspect of the upper outer quadrant of the left breast. This has a mixture of enhancement kinetics, including rapid washin/washout.  There are two associated biopsy marker artifacts. No additional masses or areas of enhancement suspicious for malignancy in either breast.  No abnormal appearing lymph nodes.  IMPRESSION: 1.5 x 1.4 x 1.1 cm biopsy-proven invasive ductal carcinoma and ductal carcinoma in situ in the upper outer quadrant of the left breast.  Otherwise, unremarkable examination.  THREE-DIMENSIONAL MR IMAGE RENDERING ON INDEPENDENT WORKSTATION:  Three-dimensional MR images were rendered by post-processing of the original MR data on an independent workstation.  The three- dimensional MR images were interpreted, and findings were reported in the accompanying complete MRI report for this study.  BI-RADS CATEGORY 6:  Known biopsy-proven malignancy - appropriate action should be taken.  Recommendation:  Treatment plan.  Original Report Authenticated By: Darrol Angel, M.D.   Mm Digital Diagnostic Unilat R  05/24/2011  *RADIOLOGY REPORT*  Clinical Data:  Recently diagnosed left breast carcinoma.  Pre MRI diagnostic right breast mammogram.  DIGITAL DIAGNOSTIC RIGHT BREAST MAMMOGRAM WITH CAD  Comparison:  09/10/2010, 06/17/2008, 06/08/2005.  Findings:  There is a heterogeneously dense right breast parenchymal pattern.  There is scattered stable benign-appearing punctate calcifications noted centrally within the right breast. There is no mass, distortion, or worrisome calcification  within the right breast. Mammographic images were processed with CAD.  IMPRESSION: Stable right breast parenchymal pattern.  No findings worrisome for developing malignancy within the right breast.  The patient has known left breast carcinoma.  BI-RADS CATEGORY 1:  Negative.  Original Report Authenticated By: Rolla Plate, M.D.    ASSESSMENT: 66 year old female with  #1 stage II A. Invasive ductal carcinoma that was grade 1 estrogen receptor +100% progesterone receptor +100% proliferation marker 8% and HER-2/neu negative diagnosed in January 2013.  #2 Patient had an MRI of the bilateral breasts performed on general 29 2013 that does revealed the tumor to measure about 1.5 cm.  #3 patient is on neoadjuvant antiestrogen therapy consisting of Arimidex 1 mg daily since 05/18/2011 overall she is doing well and there seems to be some response in the consistency of the tumor.   PLAN:   #1 patient will continue neoadjuvant antiestrogen therapy with Arimidex.  #2 she will be seen back in 3 months time for followup.  #3 we will plan on doing another radiographic study most likely a mammogram with an ultrasound in 6 months time from start of her antiestrogen therapy.   All questions were answered. The patient knows to call the clinic with any problems, questions or concerns. We can certainly see the patient much sooner if necessary.  I spent 25  minutes counseling the patient face to face. The total time spent in the appointment was 30 minutes.    Drue Second, MD Medical/Oncology [facility] [phone] (beeper) [phone] (Office)  06/08/2011, 4:03 PM

## 2011-06-09 ENCOUNTER — Other Ambulatory Visit: Payer: Self-pay | Admitting: Oncology

## 2011-06-09 DIAGNOSIS — C50419 Malignant neoplasm of upper-outer quadrant of unspecified female breast: Secondary | ICD-10-CM

## 2011-06-09 MED ORDER — ANASTROZOLE 1 MG PO TABS: 1.0000 mg | ORAL_TABLET | Freq: Every day | ORAL | Status: AC

## 2011-06-09 NOTE — Progress Notes (Signed)
Addended by: Victorino December on: 06/09/2011 06:45 AM   Modules accepted: Orders

## 2011-09-05 ENCOUNTER — Other Ambulatory Visit (HOSPITAL_BASED_OUTPATIENT_CLINIC_OR_DEPARTMENT_OTHER): Payer: 59 | Admitting: Lab

## 2011-09-05 ENCOUNTER — Telehealth: Payer: Self-pay | Admitting: Oncology

## 2011-09-05 ENCOUNTER — Encounter: Payer: Self-pay | Admitting: Family

## 2011-09-05 ENCOUNTER — Ambulatory Visit (HOSPITAL_BASED_OUTPATIENT_CLINIC_OR_DEPARTMENT_OTHER): Payer: 59 | Admitting: Family

## 2011-09-05 VITALS — BP 131/84 | HR 87 | Temp 98.4°F | Ht 69.0 in | Wt 171.7 lb

## 2011-09-05 DIAGNOSIS — C50919 Malignant neoplasm of unspecified site of unspecified female breast: Secondary | ICD-10-CM

## 2011-09-05 DIAGNOSIS — Z170 Estrogen receptor positive status [ER+]: Secondary | ICD-10-CM

## 2011-09-05 DIAGNOSIS — C50419 Malignant neoplasm of upper-outer quadrant of unspecified female breast: Secondary | ICD-10-CM

## 2011-09-05 LAB — COMPREHENSIVE METABOLIC PANEL
AST: 21 U/L (ref 0–37)
BUN: 17 mg/dL (ref 6–23)
CO2: 27 mEq/L (ref 19–32)
Calcium: 9.9 mg/dL (ref 8.4–10.5)
Creatinine, Ser: 0.83 mg/dL (ref 0.50–1.10)
Glucose, Bld: 118 mg/dL — ABNORMAL HIGH (ref 70–99)
Potassium: 4.8 mEq/L (ref 3.5–5.3)
Sodium: 138 mEq/L (ref 135–145)

## 2011-09-05 LAB — CBC WITH DIFFERENTIAL/PLATELET
Basophils Absolute: 0 10*3/uL (ref 0.0–0.1)
Eosinophils Absolute: 0.2 10*3/uL (ref 0.0–0.5)
HGB: 14.3 g/dL (ref 11.6–15.9)
NEUT#: 2.8 10*3/uL (ref 1.5–6.5)
RBC: 4.26 10*6/uL (ref 3.70–5.45)
RDW: 13.4 % (ref 11.2–14.5)
WBC: 5.6 10*3/uL (ref 3.9–10.3)
lymph#: 2.1 10*3/uL (ref 0.9–3.3)
nRBC: 0 % (ref 0–0)

## 2011-09-05 NOTE — Telephone Encounter (Signed)
gve the pt her June and aug 2013 appt calendar

## 2011-09-05 NOTE — Progress Notes (Signed)
  OFFICE PROGRESS NOTE  CC: Dr. Eulis Foster  Dr. Antony Blackbird  Dr. Emelia Loron  DIAGNOSIS:  Invasive ductal carcinoma of the left breast, grade 1, ER/PR+, HER-2/neu negative.  PRIOR THERAPY: 1. Core needle biopsy of the left breast., January 2013. 2. MRI of the bilateral breasts performed on general 29 2013 that does revealed the tumor to measure about 1.5 cm. 3. Neoadjuvant arimidex 1 mg daily since 05/18/11  CURRENT THERAPY: Arimidex 1 mg daily.  INTERVAL HISTORY: Overall, doing well, chief complaint since initiation of Arimidex is hot flashes. No longer taking estrogen or progesterone. Is under the care of a complementary care provider who has placed her on several herbs. I ask her to bring a list for our records and caution her against phytoestrogens, or anything which would prolong her bleeding time in surgery. Reports mild arthralgias/myalgias. Has vaginal dryness, uses Replens. No headache or blurred vision. No cough or shortness of breath. No abdominal pain or new bone pain. Bowel and bladder function are normal. Appetite is good, with adequate fluid intake. Remainder of the 10 point  review of systems is negative.She continues to work as a Teacher, adult education and practices "hot yoga".  Has acupuncture performed regularly. Reports decreased energy level. Not sure if it is attributable to anti-estrogen or the emotional turmoil of a breast cancer diagnosis and her son was recently incarcerated.   ALLERGIES:  Codeine; penicillins; and sulfa antibiotics.  MEDICATIONS:  Current Outpatient Prescriptions  Medication Sig Dispense Refill  . anastrozole (ARIMIDEX) 1 MG tablet        REVIEW OF SYSTEMS:  Pertinent items are noted in HPI.   PHYSICAL EXAMINATION:   ECOG PERFORMANCE STATUS: 0 - Asymptomatic  Blood pressure 131/84, pulse 87, temperature 98.4 F (36.9 C), temperature source Oral, height 5\' 9"  (1.753 m), weight 171 lb 11.2 oz (77.883 kg).  LABORATORY DATA: Lab  Results  Component Value Date   WBC 5.6 09/05/2011   HGB 14.3 09/05/2011   HCT 42.1 09/05/2011   MCV 98.8 09/05/2011   PLT 296 09/05/2011   ASSESSMENT: 66 year old female with 1. Stage IA left breast cancer, on neoadjuvant therapy with Arimidex 1 mg daily since 05/18/11 with good tolerance 2. Last bone density June 2012.  3. Hot flashes and vaginal dryness attributed to Arimidex.    PLAN:  1.  Continue neoadjuvant antiestrogen therapy with Arimidex. 2 . Return in 6 weeks to see me, no lab needed. Will need re-imaging of left breast prior to surgery.  3.  Return in 12 weeks to see Dr. Welton Flakes. Originally 6 months of neoadjuvant therapy was planned, completion will coincide with this visit with Dr. Welton Flakes and surgical plans will be made accordingly.  4.  Can use Vit E vaginally for dryness. Can also add Vit E 800 iu daily for hot flashes. She prefers to try natural remedy, does not want a prescription.   All questions were answered. The patient knows to call the clinic with any problems, questions or concerns. We can certainly see the patient much sooner if necessary.

## 2011-09-06 ENCOUNTER — Telehealth: Payer: Self-pay | Admitting: Oncology

## 2011-09-06 NOTE — Telephone Encounter (Signed)
I called the patient to follow up on a phone call she made yesterday to the breast clinic office coordinator.  She called yesterday to inquire why she did not receive a breast exam during her follow up appt.   The patient stated she knows we are busy and there are worse patients than she.   I explained that when she is here- she is who we are focused on and apologized.  I offered to have her come in to see Dr. Welton Flakes but the patient declined because she is seeing her GYN on Thursday-and she was who diagnosed her cancer to begin with .  I faxed a copy of Dr. Milta Deiters consult note as well as the last follow up note.   I asked scheduling to move her appt to Dr. Welton Flakes when she is scheduled to return in June.   Pt is satisfied and has my number should she have any other questions.

## 2011-10-04 ENCOUNTER — Telehealth: Payer: Self-pay | Admitting: Oncology

## 2011-10-04 ENCOUNTER — Encounter: Payer: Self-pay | Admitting: Oncology

## 2011-10-04 ENCOUNTER — Ambulatory Visit (HOSPITAL_BASED_OUTPATIENT_CLINIC_OR_DEPARTMENT_OTHER): Payer: 59 | Admitting: Oncology

## 2011-10-04 VITALS — BP 142/87 | HR 84 | Temp 98.7°F | Ht 69.0 in | Wt 172.9 lb

## 2011-10-04 DIAGNOSIS — C50419 Malignant neoplasm of upper-outer quadrant of unspecified female breast: Secondary | ICD-10-CM

## 2011-10-04 DIAGNOSIS — Z170 Estrogen receptor positive status [ER+]: Secondary | ICD-10-CM

## 2011-10-04 DIAGNOSIS — M79606 Pain in leg, unspecified: Secondary | ICD-10-CM

## 2011-10-04 DIAGNOSIS — M25559 Pain in unspecified hip: Secondary | ICD-10-CM

## 2011-10-04 MED ORDER — TRAMADOL HCL 50 MG PO TABS: 50.0000 mg | ORAL_TABLET | Freq: Four times a day (QID) | ORAL | Status: AC | PRN

## 2011-10-04 NOTE — Telephone Encounter (Signed)
lmonvm adviisng the pt of her md appt in aug along with the appt to see dr wakefield at [facility] and the mri breast appt at [facility]

## 2011-10-04 NOTE — Progress Notes (Signed)
OFFICE PROGRESS NOTE  CC Dr. Eulis Foster  Dr. Antony Blackbird  Dr. Emelia Loron  DIAGNOSIS:  66 year old female with new diagnosis invasive ductal carcinoma of the left breast. The tumor was grade 1 estrogen receptor +100% progesterone receptor +100% proliferation marker 8% HER-2/neu negative  PRIOR THERAPY:  1. S/P core needle biopsy with the pathology showing a grade 1 ER+ IDC (T2N0) of the lest breast  2. Begun on neoadjuvant arimidex 1 mg daily since 05/18/11  CURRENT THERAPY: arimidex 1 mg daily since May 18, 2011  INTERVAL HISTORY: Kelly Stuart 66 y.o. female returns for follow up visit. Overall she is doing well. Patient began Arimidex 1 mg daily starting in January 2013. Patient is now beginning to experience some aches and pains in her bilateral hips. She however has no pain in her shoulders in her small digits or any swelling in her hands or feet. She is denying having any nausea or vomiting no fevers no chills. She does have some hot flashes. She has no vaginal bleeding. There are no other skin changes. Remainder of the 10 point review of systems is negative. MEDICAL HISTORY: Past Medical History  Diagnosis Date  . Breast cancer     ALLERGIES:  is allergic to codeine; penicillins; and sulfa antibiotics.  MEDICATIONS:  Current Outpatient Prescriptions  Medication Sig Dispense Refill  . anastrozole (ARIMIDEX) 1 MG tablet       . traMADol (ULTRAM) 50 MG tablet Take 1 tablet (50 mg total) by mouth every 6 (six) hours as needed for pain.  30 tablet  2    SURGICAL HISTORY:  Past Surgical History  Procedure Date  . Eye surgery 1999    blepheroplasty   . Tonsillectomy   . Maxillofacial surgery   . Facial cosmetic surgery     REVIEW OF SYSTEMS:  Pertinent items are noted in HPI.   PHYSICAL EXAMINATION: General appearance: alert, cooperative and appears stated age Lymph nodes: Cervical, supraclavicular, and axillary nodes normal. Resp: clear to  auscultation bilaterally and normal percussion bilaterally Cardio: regular rate and rhythm, S1, S2 normal, no murmur, click, rub or gallop GI: soft, non-tender; bowel sounds normal; no masses,  no organomegaly Extremities: extremities normal, atraumatic, no cyanosis or edema Neurologic: Alert and oriented X 3, normal strength and tone. Normal symmetric reflexes. Normal coordination and gait Bilateral breast examination is performed: Right breast no masses nipple discharge or palpable masses or skin changes or nipple inversion. Left breast does reveal still a palpable mass but it does seems to be softer. ECOG PERFORMANCE STATUS: 0 - Asymptomatic  Blood pressure 142/87, pulse 84, temperature 98.7 F (37.1 C), temperature source Oral, height 5\' 9"  (1.753 m), weight 172 lb 14.4 oz (78.427 kg).  LABORATORY DATA: Lab Results  Component Value Date   WBC 5.6 09/05/2011   HGB 14.3 09/05/2011   HCT 42.1 09/05/2011   MCV 98.8 09/05/2011   PLT 296 09/05/2011      Chemistry      Component Value Date/Time   NA 138 09/05/2011 0903   K 4.8 09/05/2011 0903   CL 101 09/05/2011 0903   CO2 27 09/05/2011 0903   BUN 17 09/05/2011 0903   CREATININE 0.83 09/05/2011 0903      Component Value Date/Time   CALCIUM 9.9 09/05/2011 0903   ALKPHOS 52 09/05/2011 0903   AST 21 09/05/2011 0903   ALT 14 09/05/2011 0903   BILITOT 0.7 09/05/2011 0903       RADIOGRAPHIC STUDIES:  ASSESSMENT:  66 year old female with  #1 stage II A. Invasive ductal carcinoma that was grade 1 estrogen receptor +100% progesterone receptor +100% proliferation marker 8% and HER-2/neu negative diagnosed in January 2013.  #2 Patient had an MRI of the bilateral breasts performed on general 29 2013 that does revealed the tumor to measure about 1.5 cm.  #3 patient is on neoadjuvant antiestrogen therapy consisting of Arimidex 1 mg daily since 05/18/2011 overall she is doing well and there seems to be some response in the consistency of the  tumor.  #4 patient has been on Arimidex now for 6 months time. She is beginning to experience some side effects from it. On my examination her tumor size looks to be about the same as her last exam.   PLAN:  #1 we will continue adjuvant Arimidex for now. I have recommended that we proceed with some kind of a radiographic study and I do think that MRI of the breast may be most appropriate to assess  response to therapy at this point.  #2 I will also plan on sending her to Dr. Moise Boring in followup after she has had the MRI performed.the patient and I discussed lumpectomy and sentinel node biopsy. And she did indicate to me that she was not going to have a sentinel node performed because she is concerned about development of lymphedema. Certainly this is a concern and I would like to have her a opinion regarding this from Dr. Dwain Sarna as well. They didn't inform her that lumpectomy sentinel node biopsy is the standard of care for her type of malignancy.I also explained to her the rationale for doing sentinel node.  #3 I will plan on seeing the patient back in about 3 months time.   All questions were answered. The patient knows to call the clinic with any problems, questions or concerns. We can certainly see the patient much sooner if necessary.  I spent 25 minutes counseling the patient face to face. The total time spent in the appointment was 30 minutes.    Drue Second, MD Medical/Oncology [facility] [phone] (beeper) [phone] (Office)  10/04/2011, 9:57 AM

## 2011-10-04 NOTE — Patient Instructions (Signed)
1. MRI of breasts ordered.  2. Follow up with Dr. Dwain Sarna  3. Tramadol for pain  4. RTC in 2 months with me

## 2011-10-12 ENCOUNTER — Ambulatory Visit (HOSPITAL_COMMUNITY)
Admission: RE | Admit: 2011-10-12 | Discharge: 2011-10-12 | Disposition: A | Discharge status: Home / Self Care | Payer: 59 | Source: Ambulatory Visit | Attending: Oncology | Admitting: Oncology

## 2011-10-12 DIAGNOSIS — C50419 Malignant neoplasm of upper-outer quadrant of unspecified female breast: Secondary | ICD-10-CM | POA: Insufficient documentation

## 2011-10-12 MED ORDER — GADOBENATE DIMEGLUMINE 529 MG/ML IV SOLN
16.0000 mL | Freq: Once | INTRAVENOUS | Status: AC | PRN
  Administered 2011-10-12: 16 mL via INTRAVENOUS

## 2011-10-13 ENCOUNTER — Encounter: Payer: Self-pay | Admitting: *Deleted

## 2011-10-13 ENCOUNTER — Telehealth: Payer: Self-pay | Admitting: Oncology

## 2011-10-13 NOTE — Telephone Encounter (Signed)
Per pof to cancle the pt's June appt.

## 2011-10-17 ENCOUNTER — Ambulatory Visit: Payer: 59 | Admitting: Family

## 2011-11-01 ENCOUNTER — Ambulatory Visit (INDEPENDENT_AMBULATORY_CARE_PROVIDER_SITE_OTHER): Payer: 59 | Admitting: General Surgery

## 2011-11-01 ENCOUNTER — Encounter (INDEPENDENT_AMBULATORY_CARE_PROVIDER_SITE_OTHER): Payer: Self-pay | Admitting: General Surgery

## 2011-11-01 VITALS — BP 166/94 | HR 95 | Temp 97.5°F | Ht 70.5 in | Wt 171.4 lb

## 2011-11-01 DIAGNOSIS — C50419 Malignant neoplasm of upper-outer quadrant of unspecified female breast: Secondary | ICD-10-CM

## 2011-11-01 NOTE — Progress Notes (Signed)
Patient ID: Kelly Stuart, female   DOB: 10-Sep-1945, 66 y.o.   MRN: [id number]  Chief Complaint  Patient presents with  . Follow-up    reck br ca and discuss sx     HPI Kelly Stuart is a 66 y.o. female.   HPI 22 yof who I met in January with a left upper outer quadrant mass that was clinically about 3 cm and by u/s it was 2.4 cm in size.  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  She was conflicted at time of diagnosis about what to do so we began primary endocrine therapy to try and shrink this.  She has been on arimidex for 6 months.  She cannot feel this area anymore and she is otherwise doing well.  Her repeat mri shows a decrease in size with no other abnormalities.  She comes in today to discuss possible surgery.  Past Medical History  Diagnosis Date  . Breast cancer     Past Surgical History  Procedure Date  . Eye surgery 1999    blepheroplasty   . Tonsillectomy   . Maxillofacial surgery   . Facial cosmetic surgery     Family History  Problem Relation Age of Onset  . Cancer Mother   . Colon cancer Mother   . Cancer Father   . Colon cancer Father     Social History History  Substance Use Topics  . Smoking status: Former Smoker -- 2.0 packs/day    Quit date: 11/01/1991  . Smokeless tobacco: Never Used  . Alcohol Use: 1.2 oz/week    2 Cans of beer per week    Allergies  Allergen Reactions  . Codeine Nausea And Vomiting  . Penicillins Rash  . Sulfa Antibiotics Rash    Current Outpatient Prescriptions  Medication Sig Dispense Refill  . anastrozole (ARIMIDEX) 1 MG tablet         Review of Systems Review of Systems  Constitutional: Negative for fever, chills and unexpected weight change.  HENT: Negative for hearing loss, congestion, sore throat, trouble swallowing and voice change.   Eyes: Negative for visual disturbance.  Respiratory: Negative for cough and wheezing.   Cardiovascular: Negative for chest  pain, palpitations and leg swelling.  Gastrointestinal: Negative for nausea, vomiting, abdominal pain, diarrhea, constipation, blood in stool, abdominal distention and anal bleeding.  Genitourinary: Negative for hematuria, vaginal bleeding and difficulty urinating.  Musculoskeletal: Negative for arthralgias.  Skin: Negative for rash and wound.  Neurological: Negative for seizures, syncope and headaches.  Hematological: Negative for adenopathy. Does not bruise/bleed easily.  Psychiatric/Behavioral: Negative for confusion.    Blood pressure 166/94, pulse 95, temperature 97.5 F (36.4 C), temperature source Temporal, height 5' 10.5" (1.791 m), weight 171 lb 6.4 oz (77.747 kg), SpO2 98.00%.  Physical Exam Physical Exam  Vitals reviewed. Constitutional: She appears well-developed and well-nourished.  Cardiovascular: Normal rate, regular rhythm and normal heart sounds.   Pulmonary/Chest: Effort normal and breath sounds normal. She has no wheezes. She has no rales. Right breast exhibits no inverted nipple, no mass, no nipple discharge, no skin change and no tenderness. Left breast exhibits mass. Left breast exhibits no inverted nipple, no nipple discharge, no skin change and no tenderness. Breasts are symmetrical.    Lymphadenopathy:    She has no cervical adenopathy.    Data Reviewed Repeat mri reviewed with patient  Assessment    Left breast cancer on primary endocrine therapy    Plan  We discussed surgery for the breast.  I think she is a candidate for a lumpectomy now.This has shrunk enough and is in a position in the breast that this is amenable to lumpectomy.  We discussed at length lumpectomy vs mastectomy and risks and benefits.  We discussed wire placement, positive margins in 10-20% requiring further surgery.  She would like to undergo lumpectomy and I told her we could do this soon. She would like to possibly wait until after her sons sentencing hearing on September 5.  I  also told her I think we should do a sentinel node biopsy. This certainly would possibly effect her further treatments and would meet the guidelines for breast cancer care.  She is very hesitant to do this due to small possible risks.  We discussed this at length today and she will consider.  I will wait to hear back from her on what she would like to do.  I think would be ok to do her surgery either a few weeks before or after her sons hearing.         [name] 11/01/2011, 10:02 PM

## 2011-11-28 ENCOUNTER — Telehealth: Payer: Self-pay | Admitting: Oncology

## 2011-11-28 ENCOUNTER — Ambulatory Visit (HOSPITAL_BASED_OUTPATIENT_CLINIC_OR_DEPARTMENT_OTHER): Payer: 59 | Admitting: Oncology

## 2011-11-28 ENCOUNTER — Encounter: Payer: Self-pay | Admitting: Oncology

## 2011-11-28 VITALS — BP 153/80 | HR 80 | Temp 98.3°F | Resp 20 | Ht 70.5 in | Wt 175.4 lb

## 2011-11-28 DIAGNOSIS — C50419 Malignant neoplasm of upper-outer quadrant of unspecified female breast: Secondary | ICD-10-CM

## 2011-11-28 DIAGNOSIS — Z170 Estrogen receptor positive status [ER+]: Secondary | ICD-10-CM

## 2011-11-28 NOTE — Telephone Encounter (Signed)
gve the pt her oct 2013 appt calendar °

## 2011-11-28 NOTE — Progress Notes (Signed)
OFFICE PROGRESS NOTE  CC Dr. Eulis Foster  Dr. Antony Blackbird  Dr. Emelia Loron  DIAGNOSIS:  66 year old female with new diagnosis invasive ductal carcinoma of the left breast. The tumor was grade 1 estrogen receptor +100% progesterone receptor +100% proliferation marker 8% HER-2/neu negative  PRIOR THERAPY:  1. S/P core needle biopsy with the pathology showing a grade 1 ER+ IDC (T2N0) of the lest breast  2. Begun on neoadjuvant arimidex 1 mg daily since 05/18/11  CURRENT THERAPY: arimidex 1 mg daily since May 18, 2011  INTERVAL HISTORY: Kelly Stuart 66 y.o. female returns for follow up visit. Overall she is doing well she is tolerating Arimidex well she has no aches pains no nausea vomiting she does occasionally have hot flashes but they are not tolerable. She has no vaginal bleeding or discharge. Overall she seems to be in good spirits. She is very encouraged after having heard the MRI results. Remainder of the 10 point review of systems is negative.  MEDICAL HISTORY: Past Medical History  Diagnosis Date  . Breast cancer     ALLERGIES:  is allergic to codeine; penicillins; and sulfa antibiotics.  MEDICATIONS:  Current Outpatient Prescriptions  Medication Sig Dispense Refill  . anastrozole (ARIMIDEX) 1 MG tablet         SURGICAL HISTORY:  Past Surgical History  Procedure Date  . Eye surgery 1999    blepheroplasty   . Tonsillectomy   . Maxillofacial surgery   . Facial cosmetic surgery     REVIEW OF SYSTEMS:  Pertinent items are noted in HPI.   PHYSICAL EXAMINATION: General appearance: alert, cooperative and appears stated age Lymph nodes: Cervical, supraclavicular, and axillary nodes normal. Resp: clear to auscultation bilaterally and normal percussion bilaterally Cardio: regular rate and rhythm, S1, S2 normal, no murmur, click, rub or gallop GI: soft, non-tender; bowel sounds normal; no masses,  no organomegaly Extremities: extremities normal,  atraumatic, no cyanosis or edema Neurologic: Alert and oriented X 3, normal strength and tone. Normal symmetric reflexes. Normal coordination and gait Bilateral breast examination is performed: Right breast no masses nipple discharge or palpable masses or skin changes or nipple inversion. Left breast does reveal still a palpable mass but it does seems to be softer. ECOG PERFORMANCE STATUS: 0 - Asymptomatic  Blood pressure 153/80, pulse 80, temperature 98.3 F (36.8 C), resp. rate 20, height 5' 10.5" (1.791 m), weight 175 lb 6.4 oz (79.561 kg).  LABORATORY DATA: Lab Results  Component Value Date   WBC 5.6 09/05/2011   HGB 14.3 09/05/2011   HCT 42.1 09/05/2011   MCV 98.8 09/05/2011   PLT 296 09/05/2011      Chemistry      Component Value Date/Time   NA 138 09/05/2011 0903   K 4.8 09/05/2011 0903   CL 101 09/05/2011 0903   CO2 27 09/05/2011 0903   BUN 17 09/05/2011 0903   CREATININE 0.83 09/05/2011 0903      Component Value Date/Time   CALCIUM 9.9 09/05/2011 0903   ALKPHOS 52 09/05/2011 0903   AST 21 09/05/2011 0903   ALT 14 09/05/2011 0903   BILITOT 0.7 09/05/2011 0903       RADIOGRAPHIC STUDIES:  ASSESSMENT: 66 year old female with  #1 stage II A. Invasive ductal carcinoma that was grade 1 estrogen receptor +100% progesterone receptor +100% proliferation marker 8% and HER-2/neu negative diagnosed in January 2013.  #2 Patient had an MRI of the bilateral breasts performed on general 29 2013 that does revealed the tumor to  measure about 1.5 cm.Patient has been seen by Dr. Dwain Sarna. He has recommended lumpectomy. I discussed the role of sentinel node biopsy. 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 Patient also wants to postpone surgery until sometime in September.   PLAN:  #1My recommendation is to continue the Arimidex 1 mg daily until patient has her  surgery scheduled. One week prior to her surgery she may discontinue the Arimidex.  #2 I will plan on seeing her back after she has had a lumpectomy. She will remain off of Arimidex until she sees me again. She will also be me to be seen by radiation oncology and I will refer her.   All questions were answered. The patient knows to call the clinic with any problems, questions or concerns. We can certainly see the patient much sooner if necessary.  I spent 25 minutes counseling the patient face to face. The total time spent in the appointment was 30 minutes.    Drue Second, MD Medical/Oncology [facility] [phone] (beeper) [phone] (Office)  11/28/2011, 9:26 AM

## 2011-11-28 NOTE — Patient Instructions (Addendum)
Doing well. Continue Arimidex until you have surgery. Prior to surgery please stop.  I will see you in October 2013

## 2012-02-14 ENCOUNTER — Other Ambulatory Visit (INDEPENDENT_AMBULATORY_CARE_PROVIDER_SITE_OTHER): Payer: Self-pay | Admitting: General Surgery

## 2012-02-14 ENCOUNTER — Telehealth (INDEPENDENT_AMBULATORY_CARE_PROVIDER_SITE_OTHER): Payer: Self-pay | Admitting: General Surgery

## 2012-02-14 NOTE — Telephone Encounter (Signed)
LMOM asking pt to return my call. °

## 2012-02-14 NOTE — Telephone Encounter (Signed)
Pt called back and I asked her which procedure she would like to do.  She said she wanted to continue with the lumpectomy but she did NOT want to do the sentinel lymph node bx.  I suggested that she come in and speak with Dr. Dwain Sarna about this because his last office note suggested that she do he SLN bx and I want to make sure they are on the same page.  I set her up to come in on 10/28 at 10:40.

## 2012-02-14 NOTE — Telephone Encounter (Signed)
Message copied by Littie Deeds on Tue Feb 14, 2012 11:25 AM ------      Message from: [place], [place]      Created: Mon Feb 13, 2012  5:32 PM      Regarding: FW: orders       Penni Bombard      I will need to see Ms. Tate. I can schedule her for what I think will be lumpectomy if she wants to start that. Please call and ask.      MW      ----- Message -----         From: Earnestine Mealing         Sent: 02/13/2012   5:27 PM           To: Emelia Loron, MD, Docia Chuck, #      Subject: orders                                                   Ms. Prenatt has called today wanting to go ahead and schedule her br ca surgery. Will need orders  Thanks pat e

## 2012-02-20 ENCOUNTER — Ambulatory Visit (INDEPENDENT_AMBULATORY_CARE_PROVIDER_SITE_OTHER): Payer: 59 | Admitting: General Surgery

## 2012-02-20 ENCOUNTER — Telehealth: Payer: Self-pay | Admitting: *Deleted

## 2012-02-20 ENCOUNTER — Encounter (INDEPENDENT_AMBULATORY_CARE_PROVIDER_SITE_OTHER): Payer: Self-pay | Admitting: General Surgery

## 2012-02-20 VITALS — BP 117/63 | HR 69 | Temp 97.9°F | Resp 12 | Ht 70.0 in | Wt 178.8 lb

## 2012-02-20 DIAGNOSIS — C50419 Malignant neoplasm of upper-outer quadrant of unspecified female breast: Secondary | ICD-10-CM

## 2012-02-20 NOTE — Telephone Encounter (Signed)
Pt called to cancel appt d/t not having surgery at this time.  Pt will call when she decides when she is going to have surgery.

## 2012-02-20 NOTE — Progress Notes (Signed)
Patient ID: Kelly Stuart, female   DOB: 1945/08/26, 66 y.o.   MRN: [id number]  Chief Complaint  Patient presents with  . Other    HPI Kelly Stuart is a 66 y.o. female.   HPI 45 yof who I met in January with a left upper outer quadrant mass that was clinically about 3 cm and by u/s it was 2.4 cm in size. 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 She was conflicted at time of diagnosis about what to do so we began primary endocrine therapy to try and shrink this. She has been on arimidex. She cannot feel this area anymore and she is otherwise doing well. Her repeat mri done previously shows a decrease in size with no other abnormalities. She does not describe any changes since then and no new issues.  She is ready to talk about scheduling surgery.  Past Medical History  Diagnosis Date  . Breast cancer     Past Surgical History  Procedure Date  . Eye surgery 1999    blepheroplasty   . Tonsillectomy   . Maxillofacial surgery   . Facial cosmetic surgery     Family History  Problem Relation Age of Onset  . Cancer Mother   . Colon cancer Mother   . Cancer Father   . Colon cancer Father     Social History History  Substance Use Topics  . Smoking status: Former Smoker -- 2.0 packs/day    Quit date: 11/01/1991  . Smokeless tobacco: Never Used  . Alcohol Use: 1.2 oz/week    2 Cans of beer per week    Allergies  Allergen Reactions  . Codeine Nausea And Vomiting  . Penicillins Rash  . Sulfa Antibiotics Rash    Current Outpatient Prescriptions  Medication Sig Dispense Refill  . anastrozole (ARIMIDEX) 1 MG tablet         Review of Systems Review of Systems  Constitutional: Negative for fever, chills and unexpected weight change.  HENT: Negative for hearing loss, congestion, sore throat, trouble swallowing and voice change.   Eyes: Negative for visual disturbance.  Respiratory: Negative for cough and wheezing.     Cardiovascular: Negative for chest pain, palpitations and leg swelling.  Gastrointestinal: Negative for nausea, vomiting, abdominal pain, diarrhea, constipation, blood in stool, abdominal distention and anal bleeding.  Genitourinary: Negative for hematuria, vaginal bleeding and difficulty urinating.  Musculoskeletal: Negative for arthralgias.  Skin: Negative for rash and wound.  Neurological: Negative for seizures, syncope and headaches.  Hematological: Negative for adenopathy. Does not bruise/bleed easily.  Psychiatric/Behavioral: Negative for confusion.    Blood pressure 117/63, pulse 69, temperature 97.9 F (36.6 C), temperature source Temporal, resp. rate 12, height 5\' 10"  (1.778 m), weight 178 lb 12.8 oz (81.103 kg).  Physical Exam Physical Exam  Vitals reviewed. Constitutional: She appears well-developed and well-nourished.  Cardiovascular: Normal rate, regular rhythm and normal heart sounds.   Pulmonary/Chest: Effort normal and breath sounds normal. She has no wheezes. She has no rales. Right breast exhibits no inverted nipple, no mass, no nipple discharge, no skin change and no tenderness. Left breast exhibits no inverted nipple, no mass, no nipple discharge, no skin change and no tenderness. Breasts are symmetrical.    Lymphadenopathy:    She has no cervical adenopathy.    Data Reviewed BILATERAL BREAST MRI WITH AND WITHOUT CONTRAST  Technique: Multiplanar, multisequence MR images of both breasts  were obtained prior to and following the intravenous  administration  of 16ml of Multihance. Three dimensional images were evaluated at  the independent DynaCad workstation.  Comparison: MRI 05/23/2011, mammogram 05/24/2011 and earlier  Findings: Within the upper outer quadrant of the left breast, there  is an enhancing mass. The enhancement is washout type kinetics.  This currently measures 1.1 x 1.3 x 1.1 cm. Previously, this  measured 1.5 x 1.4 x 1.1 cm on pretreatment MRI  exam. Small signal  void artifact is identified, consistent with clip following  ultrasound-guided core biopsy. Background parenchymal enhancement  is minimal. Images of the right breast are unremarkable. No  suspicious internal mammary or axillary lymph nodes are identified.  IMPRESSION:  Slight decrease in size of known left breast malignancy.    Assessment    Left breast cancer    Plan    I think she is ready for surgery at this point. I think she is a candidate for lumpectomy. We discussed at length again a lumpectomy versus mastectomy with the risks and benefits associated with that. She very much would like to undergo lumpectomy. We discussed wire placement, need for radiation therapy, and positive margins and a 10-20% range that would require further surgery. I again recommended to her that we do a sentinel lymph node biopsy per at the guidelines. She again does not want to pursue a sentinel node biopsy for the concerns of the small risk of lymphedema after sentinel node biopsy due to her job. I discussed this with her at length and discussed with her that it would potentially change her adjuvant therapy recommendations. She understands all this and does not want to undergo a sentinel lymph node biopsy.       Kelly Stuart 02/20/2012, 11:06 AM

## 2012-02-22 ENCOUNTER — Other Ambulatory Visit (INDEPENDENT_AMBULATORY_CARE_PROVIDER_SITE_OTHER): Payer: Self-pay | Admitting: General Surgery

## 2012-02-22 ENCOUNTER — Telehealth (INDEPENDENT_AMBULATORY_CARE_PROVIDER_SITE_OTHER): Payer: Self-pay | Admitting: General Surgery

## 2012-02-22 ENCOUNTER — Ambulatory Visit: Payer: 59 | Admitting: Oncology

## 2012-02-22 ENCOUNTER — Other Ambulatory Visit: Payer: 59 | Admitting: Lab

## 2012-02-22 DIAGNOSIS — C50419 Malignant neoplasm of upper-outer quadrant of unspecified female breast: Secondary | ICD-10-CM

## 2012-02-22 NOTE — Telephone Encounter (Signed)
Returned pt's call and she wanted to ask me some questions about her surgery on 11/6.  She wanted to know if she would be able to return to work on that following Monday.  I explained that it is possible but that she would need to be able to have ROM and off the narcotics for her to drive.  I also warned her that she will be lethargic after the anesthesia and to not push, pull, or lift anything greater than 15 lbs.  I also informed her that I would want her to call me and Dr. Dwain Sarna on the Friday before so she can inform us of how she is feeling.  She said she would do this.  She also wanted clarification on what Dr. Dwain Sarna was talking about when he said she may have to go back to the OR.  I explained that if her pathology comes back showing the margins are positive then we would have to go back and take more of that section out.  She said she understood and was ready to progress with surgery.

## 2012-02-22 NOTE — Telephone Encounter (Signed)
Message copied by Littie Deeds on Wed Feb 22, 2012  4:37 PM ------      Message from: Isaias Sakai K      Created: Wed Feb 22, 2012  2:42 PM      Regarding: Dr Dwain Sarna      Contact: [phone]       Patient has question regarding sx.

## 2012-02-24 ENCOUNTER — Encounter (HOSPITAL_BASED_OUTPATIENT_CLINIC_OR_DEPARTMENT_OTHER): Payer: Self-pay | Admitting: *Deleted

## 2012-02-24 NOTE — Progress Notes (Signed)
To come in for labs-to stop her herbal meds preop

## 2012-02-28 ENCOUNTER — Encounter (HOSPITAL_BASED_OUTPATIENT_CLINIC_OR_DEPARTMENT_OTHER)
Admission: RE | Admit: 2012-02-28 | Discharge: 2012-02-28 | Disposition: A | Discharge status: Home / Self Care | Payer: 59 | Source: Ambulatory Visit | Attending: General Surgery | Admitting: General Surgery

## 2012-02-28 LAB — CBC WITH DIFFERENTIAL/PLATELET
Eosinophils Relative: 1 % (ref 0–5)
HCT: 41.4 % (ref 36.0–46.0)
Lymphocytes Relative: 29 % (ref 12–46)
Lymphs Abs: 2.1 10*3/uL (ref 0.7–4.0)
MCV: 99 fL (ref 78.0–100.0)
Monocytes Absolute: 0.6 10*3/uL (ref 0.1–1.0)
Monocytes Relative: 9 % (ref 3–12)
RBC: 4.18 MIL/uL (ref 3.87–5.11)
WBC: 7.2 10*3/uL (ref 4.0–10.5)

## 2012-02-28 LAB — BASIC METABOLIC PANEL
CO2: 28 mEq/L (ref 19–32)
Chloride: 100 mEq/L (ref 96–112)
Creatinine, Ser: 0.7 mg/dL (ref 0.50–1.10)
Sodium: 137 mEq/L (ref 135–145)

## 2012-02-29 ENCOUNTER — Ambulatory Visit (HOSPITAL_BASED_OUTPATIENT_CLINIC_OR_DEPARTMENT_OTHER)
Admission: RE | Admit: 2012-02-29 | Discharge: 2012-02-29 | Disposition: A | Discharge status: Home / Self Care | Payer: 59 | Source: Ambulatory Visit | Attending: General Surgery | Admitting: General Surgery

## 2012-02-29 ENCOUNTER — Encounter (HOSPITAL_BASED_OUTPATIENT_CLINIC_OR_DEPARTMENT_OTHER)
Admission: RE | Disposition: A | Discharge status: Home / Self Care | Payer: Self-pay | Source: Ambulatory Visit | Attending: General Surgery

## 2012-02-29 ENCOUNTER — Ambulatory Visit
Admission: RE | Admit: 2012-02-29 | Discharge: 2012-02-29 | Disposition: A | Discharge status: Home / Self Care | Payer: 59 | Source: Ambulatory Visit | Attending: General Surgery | Admitting: General Surgery

## 2012-02-29 ENCOUNTER — Encounter (HOSPITAL_BASED_OUTPATIENT_CLINIC_OR_DEPARTMENT_OTHER): Payer: Self-pay | Admitting: *Deleted

## 2012-02-29 ENCOUNTER — Encounter (HOSPITAL_BASED_OUTPATIENT_CLINIC_OR_DEPARTMENT_OTHER): Payer: Self-pay | Admitting: Anesthesiology

## 2012-02-29 ENCOUNTER — Ambulatory Visit (HOSPITAL_BASED_OUTPATIENT_CLINIC_OR_DEPARTMENT_OTHER): Payer: 59 | Admitting: Anesthesiology

## 2012-02-29 DIAGNOSIS — C50419 Malignant neoplasm of upper-outer quadrant of unspecified female breast: Secondary | ICD-10-CM

## 2012-02-29 DIAGNOSIS — D0590 Unspecified type of carcinoma in situ of unspecified breast: Secondary | ICD-10-CM

## 2012-02-29 DIAGNOSIS — Z01812 Encounter for preprocedural laboratory examination: Secondary | ICD-10-CM | POA: Insufficient documentation

## 2012-02-29 HISTORY — PX: BREAST LUMPECTOMY WITH NEEDLE LOCALIZATION: SHX5759

## 2012-02-29 SURGERY — BREAST LUMPECTOMY WITH NEEDLE LOCALIZATION
Anesthesia: General | Site: Breast | Laterality: Left | Wound class: Clean

## 2012-02-29 MED ORDER — MEPERIDINE HCL 25 MG/ML IJ SOLN: 6.2500 mg | INTRAMUSCULAR | Status: DC | PRN

## 2012-02-29 MED ORDER — OXYCODONE HCL 5 MG PO TABS: 5.0000 mg | ORAL_TABLET | Freq: Once | ORAL | Status: DC | PRN

## 2012-02-29 MED ORDER — KETOROLAC TROMETHAMINE 30 MG/ML IJ SOLN
INTRAMUSCULAR | Status: DC | PRN
  Administered 2012-02-29: 30 mg via INTRAVENOUS

## 2012-02-29 MED ORDER — DEXAMETHASONE SODIUM PHOSPHATE 4 MG/ML IJ SOLN
INTRAMUSCULAR | Status: DC | PRN
  Administered 2012-02-29: 10 mg via INTRAVENOUS

## 2012-02-29 MED ORDER — OXYCODONE HCL 5 MG/5ML PO SOLN
5.0000 mg | Freq: Once | ORAL | Status: DC | PRN
Start: 2012-02-29 — End: 2012-02-29

## 2012-02-29 MED ORDER — PROMETHAZINE HCL 25 MG/ML IJ SOLN: 6.2500 mg | INTRAMUSCULAR | Status: DC | PRN

## 2012-02-29 MED ORDER — HYDROMORPHONE HCL PF 1 MG/ML IJ SOLN: 0.2500 mg | INTRAMUSCULAR | Status: DC | PRN

## 2012-02-29 MED ORDER — VANCOMYCIN HCL IN DEXTROSE 1-5 GM/200ML-% IV SOLN: 1000.0000 mg | INTRAVENOUS | Status: DC

## 2012-02-29 MED ORDER — BUPIVACAINE HCL (PF) 0.25 % IJ SOLN
INTRAMUSCULAR | Status: DC | PRN
  Administered 2012-02-29: 20 mL

## 2012-02-29 MED ORDER — OXYCODONE-ACETAMINOPHEN 5-325 MG PO TABS: 1.0000 | ORAL_TABLET | ORAL | Status: DC | PRN

## 2012-02-29 MED ORDER — LACTATED RINGERS IV SOLN
INTRAVENOUS | Status: DC
  Administered 2012-02-29 (×3): via INTRAVENOUS

## 2012-02-29 MED ORDER — ONDANSETRON HCL 4 MG/2ML IJ SOLN
INTRAMUSCULAR | Status: DC | PRN
  Administered 2012-02-29: 4 mg via INTRAVENOUS

## 2012-02-29 MED ORDER — PROPOFOL 10 MG/ML IV BOLUS
INTRAVENOUS | Status: DC | PRN
  Administered 2012-02-29: 30 mg via INTRAVENOUS
  Administered 2012-02-29: 200 mg via INTRAVENOUS
  Administered 2012-02-29: 20 mg via INTRAVENOUS

## 2012-02-29 MED ORDER — LIDOCAINE HCL (CARDIAC) 20 MG/ML IV SOLN
INTRAVENOUS | Status: DC | PRN
  Administered 2012-02-29: 80 mg via INTRAVENOUS

## 2012-02-29 MED ORDER — KETOROLAC TROMETHAMINE 30 MG/ML IJ SOLN: 15.0000 mg | Freq: Once | INTRAMUSCULAR | Status: DC | PRN

## 2012-02-29 MED ORDER — ACETAMINOPHEN 10 MG/ML IV SOLN
1000.0000 mg | Freq: Once | INTRAVENOUS | Status: AC
  Administered 2012-02-29: 1000 mg via INTRAVENOUS

## 2012-02-29 SURGICAL SUPPLY — 53 items
APPLIER CLIP 9.375 MED OPEN (MISCELLANEOUS) ×2
BENZOIN TINCTURE PRP APPL 2/3 (GAUZE/BANDAGES/DRESSINGS) IMPLANT
BINDER BREAST LRG (GAUZE/BANDAGES/DRESSINGS) ×2 IMPLANT
BINDER BREAST MEDIUM (GAUZE/BANDAGES/DRESSINGS) IMPLANT
BINDER BREAST XLRG (GAUZE/BANDAGES/DRESSINGS) IMPLANT
BINDER BREAST XXLRG (GAUZE/BANDAGES/DRESSINGS) IMPLANT
BLADE SURG 15 STRL LF DISP TIS (BLADE) ×1 IMPLANT
BLADE SURG 15 STRL SS (BLADE) ×1
CANISTER SUCTION 1200CC (MISCELLANEOUS) IMPLANT
CHLORAPREP W/TINT 26ML (MISCELLANEOUS) ×2 IMPLANT
CLIP APPLIE 9.375 MED OPEN (MISCELLANEOUS) ×1 IMPLANT
CLOTH BEACON ORANGE TIMEOUT ST (SAFETY) ×2 IMPLANT
COVER MAYO STAND STRL (DRAPES) ×2 IMPLANT
COVER TABLE BACK 60X90 (DRAPES) ×2 IMPLANT
DECANTER SPIKE VIAL GLASS SM (MISCELLANEOUS) IMPLANT
DERMABOND ADVANCED (GAUZE/BANDAGES/DRESSINGS) ×1
DERMABOND ADVANCED .7 DNX12 (GAUZE/BANDAGES/DRESSINGS) ×1 IMPLANT
DEVICE DUBIN W/COMP PLATE 8390 (MISCELLANEOUS) ×2 IMPLANT
DRAPE PED LAPAROTOMY (DRAPES) ×2 IMPLANT
DRSG TEGADERM 4X4.75 (GAUZE/BANDAGES/DRESSINGS) IMPLANT
ELECT COATED BLADE 2.86 ST (ELECTRODE) ×2 IMPLANT
ELECT REM PT RETURN 9FT ADLT (ELECTROSURGICAL) ×2
ELECTRODE REM PT RTRN 9FT ADLT (ELECTROSURGICAL) ×1 IMPLANT
GAUZE SPONGE 4X4 12PLY STRL LF (GAUZE/BANDAGES/DRESSINGS) IMPLANT
GLOVE BIO SURGEON STRL SZ7 (GLOVE) ×6 IMPLANT
GLOVE BIOGEL PI IND STRL 7.0 (GLOVE) ×1 IMPLANT
GLOVE BIOGEL PI IND STRL 7.5 (GLOVE) ×1 IMPLANT
GLOVE BIOGEL PI INDICATOR 7.0 (GLOVE) ×1
GLOVE BIOGEL PI INDICATOR 7.5 (GLOVE) ×1
GOWN PREVENTION PLUS XLARGE (GOWN DISPOSABLE) ×4 IMPLANT
KIT MARKER MARGIN INK (KITS) ×2 IMPLANT
NEEDLE HYPO 25X1 1.5 SAFETY (NEEDLE) ×2 IMPLANT
NS IRRIG 1000ML POUR BTL (IV SOLUTION) IMPLANT
PACK BASIN DAY SURGERY FS (CUSTOM PROCEDURE TRAY) ×2 IMPLANT
PENCIL BUTTON HOLSTER BLD 10FT (ELECTRODE) ×2 IMPLANT
SLEEVE SCD COMPRESS KNEE MED (MISCELLANEOUS) ×2 IMPLANT
SPONGE LAP 4X18 X RAY DECT (DISPOSABLE) ×2 IMPLANT
STRIP CLOSURE SKIN 1/2X4 (GAUZE/BANDAGES/DRESSINGS) ×2 IMPLANT
SUT MNCRL AB 4-0 PS2 18 (SUTURE) ×2 IMPLANT
SUT MON AB 5-0 PS2 18 (SUTURE) IMPLANT
SUT SILK 2 0 SH (SUTURE) IMPLANT
SUT VIC AB 2-0 SH 27 (SUTURE) ×1
SUT VIC AB 2-0 SH 27XBRD (SUTURE) ×1 IMPLANT
SUT VIC AB 3-0 SH 27 (SUTURE) ×1
SUT VIC AB 3-0 SH 27X BRD (SUTURE) ×1 IMPLANT
SUT VIC AB 5-0 PS2 18 (SUTURE) IMPLANT
SUT VICRYL AB 3 0 TIES (SUTURE) IMPLANT
SYR CONTROL 10ML LL (SYRINGE) ×2 IMPLANT
TOWEL OR 17X24 6PK STRL BLUE (TOWEL DISPOSABLE) ×2 IMPLANT
TOWEL OR NON WOVEN STRL DISP B (DISPOSABLE) ×2 IMPLANT
TUBE CONNECTING 20X1/4 (TUBING) IMPLANT
WATER STERILE IRR 1000ML POUR (IV SOLUTION) IMPLANT
YANKAUER SUCT BULB TIP NO VENT (SUCTIONS) IMPLANT

## 2012-02-29 NOTE — H&P (View-Only) (Signed)
Patient ID: Kelly Stuart, female   DOB: 03/20/1946, 66 y.o.   MRN: [id number]  Chief Complaint  Patient presents with  . Other    HPI Kelly Stuart is a 66 y.o. female.   HPI 66 yof who I met in January with a left upper outer quadrant mass that was clinically about 3 cm and by u/s it was 2.4 cm in size. 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 She was conflicted at time of diagnosis about what to do so we began primary endocrine therapy to try and shrink this. She has been on arimidex. She cannot feel this area anymore and she is otherwise doing well. Her repeat mri done previously shows a decrease in size with no other abnormalities. She does not describe any changes since then and no new issues.  She is ready to talk about scheduling surgery.  Past Medical History  Diagnosis Date  . Breast cancer     Past Surgical History  Procedure Date  . Eye surgery 1999    blepheroplasty   . Tonsillectomy   . Maxillofacial surgery   . Facial cosmetic surgery     Family History  Problem Relation Age of Onset  . Cancer Mother   . Colon cancer Mother   . Cancer Father   . Colon cancer Father     Social History History  Substance Use Topics  . Smoking status: Former Smoker -- 2.0 packs/day    Quit date: 11/01/1991  . Smokeless tobacco: Never Used  . Alcohol Use: 1.2 oz/week    2 Cans of beer per week    Allergies  Allergen Reactions  . Codeine Nausea And Vomiting  . Penicillins Rash  . Sulfa Antibiotics Rash    Current Outpatient Prescriptions  Medication Sig Dispense Refill  . anastrozole (ARIMIDEX) 1 MG tablet         Review of Systems Review of Systems  Constitutional: Negative for fever, chills and unexpected weight change.  HENT: Negative for hearing loss, congestion, sore throat, trouble swallowing and voice change.   Eyes: Negative for visual disturbance.  Respiratory: Negative for cough and wheezing.     Cardiovascular: Negative for chest pain, palpitations and leg swelling.  Gastrointestinal: Negative for nausea, vomiting, abdominal pain, diarrhea, constipation, blood in stool, abdominal distention and anal bleeding.  Genitourinary: Negative for hematuria, vaginal bleeding and difficulty urinating.  Musculoskeletal: Negative for arthralgias.  Skin: Negative for rash and wound.  Neurological: Negative for seizures, syncope and headaches.  Hematological: Negative for adenopathy. Does not bruise/bleed easily.  Psychiatric/Behavioral: Negative for confusion.    Blood pressure 117/63, pulse 69, temperature 97.9 F (36.6 C), temperature source Temporal, resp. rate 12, height 5' 10" (1.778 m), weight 178 lb 12.8 oz (81.103 kg).  Physical Exam Physical Exam  Vitals reviewed. Constitutional: She appears well-developed and well-nourished.  Cardiovascular: Normal rate, regular rhythm and normal heart sounds.   Pulmonary/Chest: Effort normal and breath sounds normal. She has no wheezes. She has no rales. Right breast exhibits no inverted nipple, no mass, no nipple discharge, no skin change and no tenderness. Left breast exhibits no inverted nipple, no mass, no nipple discharge, no skin change and no tenderness. Breasts are symmetrical.    Lymphadenopathy:    She has no cervical adenopathy.    Data Reviewed BILATERAL BREAST MRI WITH AND WITHOUT CONTRAST  Technique: Multiplanar, multisequence MR images of both breasts  were obtained prior to and following the intravenous   administration  of 16ml of Multihance. Three dimensional images were evaluated at  the independent DynaCad workstation.  Comparison: MRI 05/23/2011, mammogram 05/24/2011 and earlier  Findings: Within the upper outer quadrant of the left breast, there  is an enhancing mass. The enhancement is washout type kinetics.  This currently measures 1.1 x 1.3 x 1.1 cm. Previously, this  measured 1.5 x 1.4 x 1.1 cm on pretreatment MRI  exam. Small signal  void artifact is identified, consistent with clip following  ultrasound-guided core biopsy. Background parenchymal enhancement  is minimal. Images of the right breast are unremarkable. No  suspicious internal mammary or axillary lymph nodes are identified.  IMPRESSION:  Slight decrease in size of known left breast malignancy.    Assessment    Left breast cancer    Plan    I think she is ready for surgery at this point. I think she is a candidate for lumpectomy. We discussed at length again a lumpectomy versus mastectomy with the risks and benefits associated with that. She very much would like to undergo lumpectomy. We discussed wire placement, need for radiation therapy, and positive margins and a 10-20% range that would require further surgery. I again recommended to her that we do a sentinel lymph node biopsy per at the guidelines. She again does not want to pursue a sentinel node biopsy for the concerns of the small risk of lymphedema after sentinel node biopsy due to her job. I discussed this with her at length and discussed with her that it would potentially change her adjuvant therapy recommendations. She understands all this and does not want to undergo a sentinel lymph node biopsy.       Jalee Saine 02/20/2012, 11:06 AM    

## 2012-02-29 NOTE — Interval H&P Note (Signed)
History and Physical Interval Note:  02/29/2012 2:44 PM  Kelly Stuart  has presented today for surgery, with the diagnosis of left breast cancer  The various methods of treatment have been discussed with the patient and family. After consideration of risks, benefits and other options for treatment, the patient has consented to  Left breast wire guided lumpectomy as a surgical intervention .  The patient's history has been reviewed, patient examined, no change in status, stable for surgery.  I have reviewed the patient's chart and labs.  Questions were answered to the patient's satisfaction.     Sherion Dooly

## 2012-02-29 NOTE — Op Note (Signed)
Preoperative diagnosis: Left breast cancer s/p primary endocrine therapy Postoperative diagnosis: same as above Procedure: Left breast wire guided lumpectomy Surgeon: Dr. Harden Mo  Anesthesia: general with LMA Specimen: left breast tissue marked with paint EBL: minimal Complications: none Disposition: recovery stable Sponge and needle count correct at completion  Indications:  This is a 66 year old female who presents after undergoing primary endocrine therapy for a left breast cancer. Initially she was seen in the multidisciplinary clinic and decided that she was not ready for surgery at that time and began on antiestrogen therapy. The mass has gotten somewhat smaller at this point but this was amenable to a lumpectomy from the beginning. This was patient choice for initial therapy. She is otherwise doing well. We discussed proceeding with a lumpectomy. I also discussed with her my recommendation for a sentinel lymph node biopsy but she has chosen not to undergo this is part of her therapy. We discussed the risks and benefits prior to beginning.  Procedure: After informed consent was obtained the patient was first taken of the breast center where she had a wire placed to the lesion. It was an 8 centimeter wire with the lesion right about 6 cm from the skin. These mammograms were available in the operating room. She had sequential compression devices placed. She was then placed under general anesthesia with an LMA. Her left breast was prepped and draped in the standard sterile surgical fashion. A surgical timeout was then performed.  I made a curvilinear incision over where I thought that the mass and the clip would be. I brought the wire infirm remotely which was about 5 cm from I incision. I then proceeded to excise the wire the surrounding breast tissue with cautery. This was then marked with paint and a specimen mammogram was taken.  This confirmed removal of clips and wire.  I did not take  the margin down to pectoralis muscle as I thought I would not need to.  This was confirmed by Dr. Manson Passey.  I then placed two clips deep and one clip in each cardinal position around the cavity.  I closed the deep breast tissue with 2-0 vicry.  I closed the dermis with 3-0 vicryl and the skin with 4-0 monocryl.  I then used dermabond and steri-strips.  A breast binder was placed.  She tolerated this well, was extubated in OR and transferred to recovery stable.

## 2012-02-29 NOTE — Anesthesia Procedure Notes (Signed)
Procedure Name: LMA Insertion Date/Time: 02/29/2012 3:25 PM Performed by: Gar Gibbon Pre-anesthesia Checklist: Patient identified, Emergency Drugs available, Suction available and Patient being monitored Patient Re-evaluated:Patient Re-evaluated prior to inductionOxygen Delivery Method: Circle System Utilized Preoxygenation: Pre-oxygenation with 100% oxygen Intubation Type: IV induction Ventilation: Mask ventilation without difficulty LMA: LMA inserted LMA Size: 3.0 Number of attempts: 1 Airway Equipment and Method: bite block Placement Confirmation: positive ETCO2 Tube secured with: Tape Dental Injury: Teeth and Oropharynx as per pre-operative assessment

## 2012-02-29 NOTE — Anesthesia Postprocedure Evaluation (Signed)
  Anesthesia Post-op Note  Patient: Kelly Stuart  Procedure(s) Performed: Procedure(s) (LRB) with comments: BREAST LUMPECTOMY WITH NEEDLE LOCALIZATION (Left)  Patient Location: PACU  Anesthesia Type:General  Level of Consciousness: awake  Airway and Oxygen Therapy: Patient Spontanous Breathing  Post-op Pain: mild  Post-op Assessment: Post-op Vital signs reviewed  Post-op Vital Signs: stable  Complications: No apparent anesthesia complications

## 2012-02-29 NOTE — Transfer of Care (Signed)
Immediate Anesthesia Transfer of Care Note  Patient: Kelly Stuart  Procedure(s) Performed: Procedure(s) (LRB) with comments: BREAST LUMPECTOMY WITH NEEDLE LOCALIZATION (Left)  Patient Location: PACU  Anesthesia Type:General  Level of Consciousness: awake, oriented and sedated  Airway & Oxygen Therapy: Patient Spontanous Breathing and Patient connected to face mask oxygen  Post-op Assessment: Report given to PACU RN and Post -op Vital signs reviewed and stable  Post vital signs: Reviewed and stable  Complications: No apparent anesthesia complications

## 2012-02-29 NOTE — Anesthesia Preprocedure Evaluation (Signed)
Anesthesia Evaluation  Patient identified by MRN, date of birth, ID band Patient awake    Reviewed: Allergy & Precautions, H&P , NPO status , Patient's Chart, lab work & pertinent test results  History of Anesthesia Complications Negative for: history of anesthetic complications  Airway Mallampati: I  Neck ROM: full    Dental No notable dental hx. (+) Teeth Intact, Caps and Dental Advidsory Given   Pulmonary neg pulmonary ROS,  breath sounds clear to auscultation  Pulmonary exam normal       Cardiovascular negative cardio ROS  IRhythm:regular Rate:Normal     Neuro/Psych negative neurological ROS  negative psych ROS   GI/Hepatic negative GI ROS, Neg liver ROS,   Endo/Other  negative endocrine ROS  Renal/GU negative Renal ROS  negative genitourinary   Musculoskeletal   Abdominal   Peds  Hematology negative hematology ROS (+)   Anesthesia Other Findings   Reproductive/Obstetrics negative OB ROS                           Anesthesia Physical Anesthesia Plan  ASA: I  Anesthesia Plan: General and General LMA   Post-op Pain Management:    Induction:   Airway Management Planned:   Additional Equipment:   Intra-op Plan:   Post-operative Plan:   Informed Consent: I have reviewed the patients History and Physical, chart, labs and discussed the procedure including the risks, benefits and alternatives for the proposed anesthesia with the patient or authorized representative who has indicated his/her understanding and acceptance.     Plan Discussed with: CRNA and Surgeon  Anesthesia Plan Comments:         Anesthesia Quick Evaluation

## 2012-03-01 ENCOUNTER — Telehealth (INDEPENDENT_AMBULATORY_CARE_PROVIDER_SITE_OTHER): Payer: Self-pay | Admitting: General Surgery

## 2012-03-01 ENCOUNTER — Encounter (HOSPITAL_BASED_OUTPATIENT_CLINIC_OR_DEPARTMENT_OTHER): Payer: Self-pay | Admitting: General Surgery

## 2012-03-01 NOTE — Telephone Encounter (Signed)
LMOM letting pt know that her first PO appt will be on 11/22 at 8:45.

## 2012-03-14 ENCOUNTER — Ambulatory Visit: Payer: 59

## 2012-03-14 ENCOUNTER — Ambulatory Visit: Payer: 59 | Admitting: Radiation Oncology

## 2012-03-15 ENCOUNTER — Ambulatory Visit
Admission: RE | Admit: 2012-03-15 | Discharge: 2012-03-15 | Disposition: A | Discharge status: Home / Self Care | Payer: 59 | Source: Ambulatory Visit | Attending: Radiation Oncology | Admitting: Radiation Oncology

## 2012-03-15 ENCOUNTER — Encounter: Payer: Self-pay | Admitting: Radiation Oncology

## 2012-03-15 VITALS — BP 152/78 | HR 87 | Temp 97.9°F | Wt 178.8 lb

## 2012-03-15 DIAGNOSIS — Z880 Allergy status to penicillin: Secondary | ICD-10-CM | POA: Insufficient documentation

## 2012-03-15 DIAGNOSIS — Z87891 Personal history of nicotine dependence: Secondary | ICD-10-CM | POA: Insufficient documentation

## 2012-03-15 DIAGNOSIS — C50419 Malignant neoplasm of upper-outer quadrant of unspecified female breast: Secondary | ICD-10-CM | POA: Insufficient documentation

## 2012-03-15 DIAGNOSIS — Z886 Allergy status to analgesic agent status: Secondary | ICD-10-CM | POA: Insufficient documentation

## 2012-03-15 DIAGNOSIS — Z882 Allergy status to sulfonamides status: Secondary | ICD-10-CM | POA: Insufficient documentation

## 2012-03-15 NOTE — Progress Notes (Signed)
Radiation Oncology         [phone] ________________________________  Reevaluation note  Name: Kelly Stuart MRN: [id number]  Date: 03/15/2012  DOB: [birth year]  WU:[id number], Marda Stalker, MD  Emelia Loron, MD , Drue Second, MD  REFERRING PHYSICIAN: Emelia Loron, MD  DIAGNOSIS: The encounter diagnosis was Cancer of upper-outer quadrant of female breast. Left Breast  HISTORY OF PRESENT ILLNESS::Kelly Stuart is a 66 y.o. female who is  seen out of the courtesy of Dr. Emelia Loron for further evaluation as it relates to her left breast cancer.  The patient was initially seen in the multidisciplinary breast clinic. It was recommended at that time the patient receive  neoadjuvant hormonal therapy. The patient is been on Arimidex.   on clinical exam she had a good response to her neoadjuvant treatment.  The patient was taken to the operating room on 02/29/2012 at which time she underwent a left breast wire guided lumpectomy.   She did not wish to have a sentinel node procedure as recommended.  The patient was found to have a residual 1.1 cm well-differentiated invasive ductal carcinoma remaining within the left breast. The surgical margins were clear with the closest margin being superior at 2 mm.  The tumor showed no HER-2/neu amplification. Patient is now seen today to coordinate her breast conservation therapy.  PREVIOUS RADIATION THERAPY: No  PAST MEDICAL HISTORY:  has a past medical history of Breast cancer.    PAST SURGICAL HISTORY: Past Surgical History  Procedure Date  . Tonsillectomy   . Maxillofacial surgery 1978    mouth-ealign teeth-  . Facial cosmetic surgery 03    facial  . Eye surgery 1999    blepheroplasty   . Colonoscopy   . Breast lumpectomy with needle localization 02/29/2012    Procedure: BREAST LUMPECTOMY WITH NEEDLE LOCALIZATION;  Surgeon: Emelia Loron, MD;  Location: [facility];  Service: General;  Laterality: Left;    FAMILY  HISTORY: family history includes Cancer in her father and mother and Colon cancer in her father and mother.  SOCIAL HISTORY:  reports that she quit smoking about 20 years ago. Her smoking use included Cigarettes. She smoked 2 packs per day. She has never used smokeless tobacco. She reports that she drinks about 1.2 ounces of alcohol per week. She reports that she does not use illicit drugs.  ALLERGIES: Codeine; Penicillins; and Sulfa antibiotics  MEDICATIONS:  No current outpatient prescriptions on file.    REVIEW OF SYSTEMS:  A 15 point review of systems is documented in the electronic medical record. This was obtained by the nursing staff. However, I reviewed this with the patient to discuss relevant findings and make appropriate changes.  She denies any discomfort within the breast area or drainage from her lumpectomy scar. she denies any swelling in her left arm or hand.   PHYSICAL EXAM:  weight is 178 lb 12.8 oz (81.103 kg). Her temperature is 97.9 F (36.6 C). Her blood pressure is 152/78 and her pulse is 87.  no palpable supraclavicular or axillary adenopathy. The lungs are clear to auscultation. The heart has a regular rhythm and rate.  Examination of the right breast reveals no mass or nipple discharge. Examination of the left breast reveals a small lumpectomy scar in the upper outer quadrant. There is no dominant mass appreciated  In the breast,  nipple discharge or bleeding.   LABORATORY DATA:  Lab Results  Component Value Date   WBC 7.2 02/28/2012   HGB 13.9  02/28/2012   HCT 41.4 02/28/2012   MCV 99.0 02/28/2012   PLT 299 02/28/2012   Lab Results  Component Value Date   NA 137 02/28/2012   K 4.3 02/28/2012   CL 100 02/28/2012   CO2 28 02/28/2012   Lab Results  Component Value Date   ALT 14 09/05/2011   AST 21 09/05/2011   ALKPHOS 52 09/05/2011   BILITOT 0.7 09/05/2011     RADIOGRAPHY: Mm Breast Surgical Specimen  02/29/2012  *RADIOLOGY REPORT*  Clinical Data:  Known left  breast cancer after for surgery after neoadjuvant therapy.  NEEDLE LOCALIZATION WITH MAMMOGRAPHIC GUIDANCE AND SPECIMEN RADIOGRAPH  Comparison:  Previous exams.  Patient presents for needle localization prior to breast surgery. I met with the patient and we discussed the procedure of needle localization including benefits and alternatives. We discussed the high likelihood of a successful procedure. We discussed the risks of the procedure, including infection, bleeding, tissue injury, and further surgery. Informed, written consent was given.  Using mammographic guidance, sterile technique, 2% lidocaine and a 7 cm modified Kopans needle, ribbon biopsy clip was localized using a cranial approach.  The films are marked for Dr. Dwain Sarna.  Specimen radiograph was performed at day surgery, and confirms wire, two biopsy clips including the ribbon clip present in the tissue sample.  The specimen is marked for pathology.  IMPRESSION: Needle localization left breast.  No apparent complications.   Original Report Authenticated By: Sherian Rein, M.D.    Mm Breast Wire Localization Left  02/29/2012  *RADIOLOGY REPORT*  Clinical Data:  Known left breast cancer after for surgery after neoadjuvant therapy.  NEEDLE LOCALIZATION WITH MAMMOGRAPHIC GUIDANCE AND SPECIMEN RADIOGRAPH  Comparison:  Previous exams.  Patient presents for needle localization prior to breast surgery. I met with the patient and we discussed the procedure of needle localization including benefits and alternatives. We discussed the high likelihood of a successful procedure. We discussed the risks of the procedure, including infection, bleeding, tissue injury, and further surgery. Informed, written consent was given.  Using mammographic guidance, sterile technique, 2% lidocaine and a 7 cm modified Kopans needle, ribbon biopsy clip was localized using a cranial approach.  The films are marked for Dr. Dwain Sarna.  Specimen radiograph was performed at day surgery,  and confirms wire, two biopsy clips including the ribbon clip present in the tissue sample.  The specimen is marked for pathology.  IMPRESSION: Needle localization left breast.  No apparent complications.   Original Report Authenticated By: Sherian Rein, M.D.       IMPRESSION: Clinical stage IIA invasive ductal carcinoma of the left breast. As above the patient underwent neoadjuvant treatment with Arimidex.  She did have a response to her hormonal therapy with tumor shrinkage. She was able to undergo left lumpectomy with clear margins. As above the patient did not wish to have evaluation of her lymph nodes with the sentinel node procedure. She would be a good candidate for breast conservation therapy with radiation therapy directed at the left breast.  I discussed consideration for formal axillary irradiation since she did not have assessment of the axilla surgically.  She does not wish to proceed with axillary irradiation as part of her overall management. In fact the patient is unsure at this point whether she will proceed with radiation therapy.   I did strongly encourage her to consider radiation therapy to reduce the chances for local recurrence. The patient will only consider 4 weeks of radiation therapy ( hypo-fractionated accelerated radiation therapy).  Given her breast contour and anticipated set up as well as age,  I do believe she could proceed with this treatment.  PLAN: The patient will be meeting with Dr. Welton Flakes and Dr. Dwain Sarna later this week. After meeting with her medical oncologist and surgeon she will make a determination as to whether she will proceed with radiation therapy as part of her overall management.     ------------------------------------------------   Billie Lade, PhD, MD

## 2012-03-15 NOTE — Progress Notes (Signed)
Please see the Nurse Progress Note in the MD Initial Consult Encounter for this patient. 

## 2012-03-15 NOTE — Progress Notes (Signed)
Patient here formal radiation consultation for left  breast cancer after initial assessment in the breast clinic.Doing very well, positive and up beat.No current distress verbalized.Divorced, has one adopted son, no pregnancies.Continues to have some hot flashes.

## 2012-03-16 ENCOUNTER — Encounter: Payer: Self-pay | Admitting: Oncology

## 2012-03-16 ENCOUNTER — Ambulatory Visit (INDEPENDENT_AMBULATORY_CARE_PROVIDER_SITE_OTHER): Payer: 59 | Admitting: General Surgery

## 2012-03-16 ENCOUNTER — Ambulatory Visit (HOSPITAL_BASED_OUTPATIENT_CLINIC_OR_DEPARTMENT_OTHER): Payer: 59 | Admitting: Oncology

## 2012-03-16 ENCOUNTER — Telehealth: Payer: Self-pay | Admitting: *Deleted

## 2012-03-16 ENCOUNTER — Encounter (INDEPENDENT_AMBULATORY_CARE_PROVIDER_SITE_OTHER): Payer: Self-pay | Admitting: General Surgery

## 2012-03-16 VITALS — BP 149/86 | HR 80 | Temp 98.2°F | Resp 20 | Ht 70.0 in | Wt 178.6 lb

## 2012-03-16 VITALS — BP 136/96 | HR 76 | Temp 97.5°F | Ht 70.0 in | Wt 177.0 lb

## 2012-03-16 DIAGNOSIS — Z09 Encounter for follow-up examination after completed treatment for conditions other than malignant neoplasm: Secondary | ICD-10-CM

## 2012-03-16 DIAGNOSIS — C50419 Malignant neoplasm of upper-outer quadrant of unspecified female breast: Secondary | ICD-10-CM

## 2012-03-16 DIAGNOSIS — Z170 Estrogen receptor positive status [ER+]: Secondary | ICD-10-CM

## 2012-03-16 NOTE — Progress Notes (Signed)
Subjective:     Patient ID: Kelly Stuart, female   DOB: Jun 02, 1945, 66 y.o.   MRN: [id number]  HPI This is a 66 year old female who has a left breast cancer. We began her on antiestrogen therapy initially she had a good response to this. I then took her to the operating room and a left breast wire-guided lumpectomy. She did not want to have a node biopsy at the same time. She did great for surgery returns today doing very well. Her pathology shows a 1.1 cm invasive ductal carcinoma with DCIS present in her margins are clear. This is HER-2/neu negative and hormone receptor positive. She has been seen by radiation oncology postoperatively and is now considering that and will see medical oncology later today.  Review of Systems     Objective:   Physical Exam Left breast incision healing well without infection    Assessment:     Status post left breast lumpectomy    Plan:     We discussed for a long time the need for adjuvant therapy. I told her that I recommended proceeding with radiation therapy to her breast as well as anti-estrogen therapy. She is considering these right now and will followup with both medical and radiation oncology. I told her both her local and systemic recurrence rates are much increased if she decides not to go undergo adjuvant therapy. I released to full activity today and I will plan on seeing her in 4 months unless she needs to be seen sooner.

## 2012-03-16 NOTE — Patient Instructions (Addendum)
Proceed with radiation therapy  Declined any further anti-estrogen therapy

## 2012-03-16 NOTE — Progress Notes (Signed)
OFFICE PROGRESS NOTE  CC Dr. Eulis Foster  Dr. Antony Blackbird  Dr. Emelia Loron  DIAGNOSIS:  66 year old female with new diagnosis invasive ductal carcinoma of the left breast. The tumor was grade 1 estrogen receptor +100% progesterone receptor +100% proliferation marker 8% HER-2/neu negative  PRIOR THERAPY:  1. S/P core needle biopsy with the pathology showing a grade 1 ER+ IDC (T2N0) of the lest breast  2. Begun on neoadjuvant arimidex 1 mg daily since 05/18/11 - 02/2012  3. S/p Lumpectomy on 02/29/12 without a sentinel node procedure (patient declined). The final pathology showed a 1.1 cm well differentiated invasive ductal carcinoma ER+ Her2 negative  4. She has seen Dr. Roselind Messier for consideration of post lumpectomy radiation  CURRENT THERAPY: observation  INTERVAL HISTORY: Kelly Stuart 66 y.o. female returns for follow up visit. Patient is doing well post. She has no real complaints, no fevers or chills, no pain, no swelling in her arms or breast. Remainder of the 10 point review of systems is negative.  MEDICAL HISTORY: Past Medical History  Diagnosis Date  . Breast cancer     ER+PR+ HER-2NEU negative    ALLERGIES:  is allergic to codeine; penicillins; and sulfa antibiotics.  MEDICATIONS:  No current outpatient prescriptions on file.    SURGICAL HISTORY:  Past Surgical History  Procedure Date  . Tonsillectomy   . Maxillofacial surgery 1978    mouth-ealign teeth-  . Facial cosmetic surgery 03    facial  . Eye surgery 1999    blepheroplasty   . Colonoscopy   . Breast lumpectomy with needle localization 02/29/2012    Procedure: BREAST LUMPECTOMY WITH NEEDLE LOCALIZATION;  Surgeon: Emelia Loron, MD;  Location: [facility];  Service: General;  Laterality: Left;  . Breast surgery     lumpectomy    REVIEW OF SYSTEMS:  Pertinent items are noted in HPI.   PHYSICAL EXAMINATION: General appearance: alert, cooperative and appears stated  age Lymph nodes: Cervical, supraclavicular, and axillary nodes normal. Resp: clear to auscultation bilaterally and normal percussion bilaterally Cardio: regular rate and rhythm, S1, S2 normal, no murmur, click, rub or gallop GI: soft, non-tender; bowel sounds normal; no masses,  no organomegaly Extremities: extremities normal, atraumatic, no cyanosis or edema Neurologic: Alert and oriented X 3, normal strength and tone. Normal symmetric reflexes. Normal coordination and gait Bilateral breast examination is performed: Right breast no masses nipple discharge or palpable masses or skin changes or nipple inversion.Left Breast: healed incisional scar no masses or tenderness.  ECOG PERFORMANCE STATUS: 0 - Asymptomatic  Blood pressure 149/86, pulse 80, temperature 98.2 F (36.8 C), temperature source Oral, resp. rate 20, height 5\' 10"  (1.778 m), weight 178 lb 9.6 oz (81.012 kg).  LABORATORY DATA: Lab Results  Component Value Date   WBC 7.2 02/28/2012   HGB 13.9 02/28/2012   HCT 41.4 02/28/2012   MCV 99.0 02/28/2012   PLT 299 02/28/2012      Chemistry      Component Value Date/Time   NA 137 02/28/2012 1030   K 4.3 02/28/2012 1030   CL 100 02/28/2012 1030   CO2 28 02/28/2012 1030   BUN 12 02/28/2012 1030   CREATININE 0.70 02/28/2012 1030      Component Value Date/Time   CALCIUM 9.7 02/28/2012 1030   ALKPHOS 52 09/05/2011 0903   AST 21 09/05/2011 0903   ALT 14 09/05/2011 0903   BILITOT 0.7 09/05/2011 0903    ADDITIONAL INFORMATION: CHROMOGENIC IN-SITU HYBRIDIZATION Interpretation HER-2/NEU BY CISH -  NO AMPLIFICATION OF HER-2 DETECTED. THE RATIO OF HER-2: CEP 17 SIGNALS WAS 1.21. Reference range: Ratio: HER2:CEP17 < 1.8 - gene amplification not observed Ratio: HER2:CEP 17 1.8-2.2 - equivocal result Ratio: HER2:CEP17 > 2.2 - gene amplification observed Pecola Leisure MD Pathologist, Electronic Signature ( Signed 03/06/2012) FINAL DIAGNOSIS Diagnosis Breast, lumpectomy, Left - INVASIVE DUCTAL  CARCINOMA, 1.1 CM. - DUCTAL CARCINOMA IN SITU. - MARGINS NOT INVOLVED. - CLOSEST MARGIN SUPERIOR AT 0.2 CM. Microscopic Comment BREAST, INVASIVE TUMOR, WITH LYMPH NODE SAMPLING Specimen, including laterality: Left breast. Procedure: Needle localized lumpectomy. Grade: I. 1 of 3 FINAL for Wheller, Nadene ([id number]) Microscopic Comment(continued) Tubule formation: 1. Nuclear pleomorphism: 2. Mitotic: 1. Tumor size (gross measurement): 1.1 cm. Margins: Free of tumor. Invasive, distance to closest margin: 0.2 cm from superior margin. In-situ, distance to closest margin: 0.8 cm from superior margin. If margin positive, focally or broadly: N/A. Lymphovascular invasion: No. Ductal carcinoma in situ: Present. Grade: Low grade. Extensive intraductal component: No. Lobular neoplasia: No. Tumor focality: Unifocal. Treatment effect: No. If present, treatment effect in breast tissue, lymph nodes or both: N/A. Extent of tumor: Skin: N/A. Nipple: N/A. Skeletal muscle: N/A. Lymph nodes: # examined: 0. Lymph nodes with metastasis: N/A. Isolated tumor cells (< 0.2 mm): N/A. Micrometastasis: (> 0.2 mm and < 2.0 mm): N/A. Macrometastasis: (> 2.0 mm): N/A. Extracapsular extension: N/A. Breast prognostic profile: Case #[id number]: Estrogen receptor: 100%, positive. Progesterone receptor: 100%, positive. HER-2/neu by CISH: No amplification, ratio 1.19. Will be repeated on current specimen. Ki-67: 8%. Non-neoplastic breast: Fibrocystic changes with calcifications. TNM: pT1c, pNX, pMX. (JDP:eps 03/02/12) Jimmy Picket MD Pathologist, Electronic Signature (Case signed 03/02/2012) Specimen Gross and Clinical Information Specimen(s) Obtained: Breast, lumpectomy, Left Specimen Clinical   RADIOGRAPHIC STUDIES:  ASSESSMENT: 66 year old female with  #1 stage II A. Invasive ductal carcinoma that was grade 1 estrogen receptor +100% progesterone receptor +100% proliferation marker 8% and  HER-2/neu negative diagnosed in January 2013.She is status post neoadjuvant arimidex for about 9 months.   #2 She has undergone lumpectomy without sentinel lymph node biopsy. Final pathology showed a 1.1 cm residual disease. She is doing well post op  3. Patient at this time is declining any further adjuvant anti=estrogen therapy. She feels she has done all she can and does not wish to go back on anything. She is also thinking about radiation therapy but has not definitely said wether or not she will take it.   PLAN:  #1 I recommended post RT followed by anti-estrogen therapy adjuvantly either an AI or even tamoxifen to minimize the side effects but patient will think about it.  All questions were answered. The patient knows to call the clinic with any problems, questions or concerns. We can certainly see the patient much sooner if necessary.  I spent 25 minutes counseling the patient face to face. The total time spent in the appointment was 30 minutes.    Drue Second, MD Medical/Oncology [facility] [phone] (beeper) [phone] (Office)  03/16/2012, 1:58 PM

## 2012-03-16 NOTE — Telephone Encounter (Signed)
Gave patient appointment for 08-2012 

## 2012-03-19 NOTE — Addendum Note (Signed)
Encounter addended by: Lowella Petties, RN on: 03/19/2012  8:47 AM<BR>     Documentation filed: Charges VN

## 2012-05-14 ENCOUNTER — Telehealth: Payer: Self-pay | Admitting: Radiation Oncology

## 2012-05-14 NOTE — Telephone Encounter (Signed)
Phoned patient as requested by Dr. Roselind Messier to inquire if she would still be interested in radiation treatment now that she has insurance coverage. No answer obtained. Left message requesting return call.

## 2012-05-16 ENCOUNTER — Telehealth: Payer: Self-pay | Admitting: Radiation Oncology

## 2012-05-16 NOTE — Telephone Encounter (Signed)
Patient returned this writer's call. Patient states,"I have thought long and hard about radiation and decided I do not want to have radiation therapy." Expressed understanding to the patient and encouraged her to contact our staff with future needs. Patient verbalized understanding. Routed this message to Dr. Roselind Messier.

## 2012-05-28 ENCOUNTER — Telehealth: Payer: Self-pay | Admitting: Radiation Oncology

## 2012-05-28 NOTE — Telephone Encounter (Signed)
Phoned patient at home number as listed in demographics. Left message encouraging patient to continue to follow up with her surgeon and medical oncologist per Dr. Trina Ao order. Encouraged patient to contact our staff with future needs.

## 2012-06-12 ENCOUNTER — Other Ambulatory Visit: Payer: Self-pay | Admitting: *Deleted

## 2012-06-12 ENCOUNTER — Ambulatory Visit (HOSPITAL_BASED_OUTPATIENT_CLINIC_OR_DEPARTMENT_OTHER): Payer: Medicare Other | Admitting: Oncology

## 2012-06-12 ENCOUNTER — Other Ambulatory Visit (HOSPITAL_BASED_OUTPATIENT_CLINIC_OR_DEPARTMENT_OTHER): Payer: Medicare Other | Admitting: Lab

## 2012-06-12 VITALS — BP 151/85 | HR 93 | Temp 98.1°F | Resp 20 | Ht 70.0 in | Wt 181.1 lb

## 2012-06-12 DIAGNOSIS — C50419 Malignant neoplasm of upper-outer quadrant of unspecified female breast: Secondary | ICD-10-CM

## 2012-06-12 DIAGNOSIS — Z170 Estrogen receptor positive status [ER+]: Secondary | ICD-10-CM | POA: Diagnosis not present

## 2012-06-12 DIAGNOSIS — C50412 Malignant neoplasm of upper-outer quadrant of left female breast: Secondary | ICD-10-CM

## 2012-06-12 LAB — CBC WITH DIFFERENTIAL/PLATELET
BASO%: 0.2 % (ref 0.0–2.0)
MCHC: 33.3 g/dL (ref 31.5–36.0)
MONO#: 0.4 10*3/uL (ref 0.1–0.9)
RBC: 3.99 10*6/uL (ref 3.70–5.45)
RDW: 13.3 % (ref 11.2–14.5)
WBC: 6.9 10*3/uL (ref 3.9–10.3)
lymph#: 2.8 10*3/uL (ref 0.9–3.3)

## 2012-06-12 LAB — COMPREHENSIVE METABOLIC PANEL (CC13)
ALT: 15 U/L (ref 0–55)
AST: 17 U/L (ref 5–34)
CO2: 26 mEq/L (ref 22–29)
Calcium: 9.8 mg/dL (ref 8.4–10.4)
Chloride: 106 mEq/L (ref 98–107)
Sodium: 142 mEq/L (ref 136–145)
Total Protein: 7.4 g/dL (ref 6.4–8.3)

## 2012-06-12 NOTE — Telephone Encounter (Signed)
Per MD, notified patient that prescription for Vagifem to be inserted vaginally once/month called into her pharmacy. Patient expressed thanks and verbalized understanding. No further questions at this time. Patient knows to call for any questions or concerns.

## 2012-06-12 NOTE — Patient Instructions (Addendum)
vagifem cream  I will see you back in 6 months

## 2012-06-12 NOTE — Telephone Encounter (Signed)
gv pt appt schedule for August.  °

## 2012-06-15 ENCOUNTER — Other Ambulatory Visit: Payer: 59 | Admitting: Lab

## 2012-06-15 ENCOUNTER — Ambulatory Visit: Payer: 59 | Admitting: Oncology

## 2012-06-24 NOTE — Progress Notes (Signed)
OFFICE PROGRESS NOTE  CC Dr. Eulis Stuart  Dr. Antony Stuart  Dr. Emelia Stuart  DIAGNOSIS:  67 year old female with new diagnosis invasive ductal carcinoma of the left breast. The tumor was grade 1 estrogen receptor +100% progesterone receptor +100% proliferation marker 8% HER-2/neu negative  PRIOR THERAPY:  1. S/P core needle biopsy with the pathology showing a grade 1 ER+ IDC (T2N0) of the lest breast  2. Begun on neoadjuvant arimidex 1 mg daily since 05/18/11 - 02/2012  3. S/p Lumpectomy on 02/29/12 without a sentinel node procedure (patient declined). The final pathology showed a 1.1 cm well differentiated invasive ductal carcinoma ER+ Her2 negative  4. She has seen Dr. Roselind Stuart for consideration of post lumpectomy radiation but patient declined.she has also declined any kind of antiestrogen therapy.  CURRENT THERAPY: observation  INTERVAL HISTORY: Kelly Stuart 67 y.o. female returns for follow up visit. Patient is doing well post. She has no real complaints, no fevers or chills, no pain, no swelling in her arms or breast. Remainder of the 10 point review of systems is negative.  MEDICAL HISTORY: Past Medical History  Diagnosis Date  . Breast cancer     ER+PR+ HER-2NEU negative    ALLERGIES:  is allergic to codeine; penicillins; and sulfa antibiotics.  MEDICATIONS:  No current outpatient prescriptions on file.   No current facility-administered medications for this visit.    SURGICAL HISTORY:  Past Surgical History  Procedure Laterality Date  . Tonsillectomy    . Maxillofacial surgery  1978    mouth-ealign teeth-  . Facial cosmetic surgery  03    facial  . Eye surgery  1999    blepheroplasty   . Colonoscopy    . Breast lumpectomy with needle localization  02/29/2012    Procedure: BREAST LUMPECTOMY WITH NEEDLE LOCALIZATION;  Surgeon: Kelly Loron, MD;  Location: [facility];  Service: General;  Laterality: Left;  . Breast surgery     lumpectomy    REVIEW OF SYSTEMS:  Pertinent items are noted in HPI.   PHYSICAL EXAMINATION: General appearance: alert, cooperative and appears stated age Lymph nodes: Cervical, supraclavicular, and axillary nodes normal. Resp: clear to auscultation bilaterally and normal percussion bilaterally Cardio: regular rate and rhythm, S1, S2 normal, no murmur, click, rub or gallop GI: soft, non-tender; bowel sounds normal; no masses,  no organomegaly Extremities: extremities normal, atraumatic, no cyanosis or edema Neurologic: Alert and oriented X 3, normal strength and tone. Normal symmetric reflexes. Normal coordination and gait Bilateral breast examination is performed: Right breast no masses nipple discharge or palpable masses or skin changes or nipple inversion.Left Breast: healed incisional scar no masses or tenderness.  ECOG PERFORMANCE STATUS: 0 - Asymptomatic  Blood pressure 151/85, pulse 93, temperature 98.1 F (36.7 C), temperature source Oral, resp. rate 20, height 5\' 10"  (1.778 m), weight 181 lb 1.6 oz (82.146 kg).  LABORATORY DATA: Lab Results  Component Value Date   WBC 6.9 06/12/2012   HGB 13.0 06/12/2012   HCT 39.1 06/12/2012   MCV 98.1 06/12/2012   PLT 246 06/12/2012      Chemistry      Component Value Date/Time   NA 142 06/12/2012 1411   NA 137 02/28/2012 1030   K 3.9 06/12/2012 1411   K 4.3 02/28/2012 1030   CL 106 06/12/2012 1411   CL 100 02/28/2012 1030   CO2 26 06/12/2012 1411   CO2 28 02/28/2012 1030   BUN 15.8 06/12/2012 1411   BUN 12 02/28/2012 1030  CREATININE 0.8 06/12/2012 1411   CREATININE 0.70 02/28/2012 1030      Component Value Date/Time   CALCIUM 9.8 06/12/2012 1411   CALCIUM 9.7 02/28/2012 1030   ALKPHOS 69 06/12/2012 1411   ALKPHOS 52 09/05/2011 0903   AST 17 06/12/2012 1411   AST 21 09/05/2011 0903   ALT 15 06/12/2012 1411   ALT 14 09/05/2011 0903   BILITOT 0.65 06/12/2012 1411   BILITOT 0.7 09/05/2011 0903    ADDITIONAL INFORMATION: CHROMOGENIC IN-SITU  HYBRIDIZATION Interpretation HER-2/NEU BY CISH - NO AMPLIFICATION OF HER-2 DETECTED. THE RATIO OF HER-2: CEP 17 SIGNALS WAS 1.21. Reference range: Ratio: HER2:CEP17 < 1.8 - gene amplification not observed Ratio: HER2:CEP 17 1.8-2.2 - equivocal result Ratio: HER2:CEP17 > 2.2 - gene amplification observed Kelly Leisure MD Pathologist, Electronic Signature ( Signed 03/06/2012) FINAL DIAGNOSIS Diagnosis Breast, lumpectomy, Left - INVASIVE DUCTAL CARCINOMA, 1.1 CM. - DUCTAL CARCINOMA IN SITU. - MARGINS NOT INVOLVED. - CLOSEST MARGIN SUPERIOR AT 0.2 CM. Microscopic Comment BREAST, INVASIVE TUMOR, WITH LYMPH NODE SAMPLING Specimen, including laterality: Left breast. Procedure: Needle localized lumpectomy. Grade: I. 1 of 3 FINAL for Kelly Stuart ([id number]) Microscopic Comment(continued) Tubule formation: 1. Nuclear pleomorphism: 2. Mitotic: 1. Tumor size (gross measurement): 1.1 cm. Margins: Free of tumor. Invasive, distance to closest margin: 0.2 cm from superior margin. In-situ, distance to closest margin: 0.8 cm from superior margin. If margin positive, focally or broadly: N/A. Lymphovascular invasion: No. Ductal carcinoma in situ: Present. Grade: Low grade. Extensive intraductal component: No. Lobular neoplasia: No. Tumor focality: Unifocal. Treatment effect: No. If present, treatment effect in breast tissue, lymph nodes or both: N/A. Extent of tumor: Skin: N/A. Nipple: N/A. Skeletal muscle: N/A. Lymph nodes: # examined: 0. Lymph nodes with metastasis: N/A. Isolated tumor cells (< 0.2 mm): N/A. Micrometastasis: (> 0.2 mm and < 2.0 mm): N/A. Macrometastasis: (> 2.0 mm): N/A. Extracapsular extension: N/A. Breast prognostic profile: Case #[id number]: Estrogen receptor: 100%, positive. Progesterone receptor: 100%, positive. HER-2/neu by CISH: No amplification, ratio 1.19. Will be repeated on current specimen. Ki-67: 8%. Non-neoplastic breast: Fibrocystic changes with  calcifications. TNM: pT1c, pNX, pMX. (Kelly Stuart:eps 03/02/12) Kelly Picket MD Pathologist, Electronic Signature (Case signed 03/02/2012) Specimen Gross and Clinical Information Specimen(s) Obtained: Breast, lumpectomy, Left Specimen Clinical   RADIOGRAPHIC STUDIES:  ASSESSMENT: 67 year old female with  #1 stage II A. Invasive ductal carcinoma that was grade 1 estrogen receptor +100% progesterone receptor +100% proliferation marker 8% and HER-2/neu negative diagnosed in January 2013.She is status post neoadjuvant arimidex for about 9 months.   #2 She has undergone lumpectomy without sentinel lymph node biopsy. Final pathology showed a 1.1 cm residual disease. She is doing well post op  3. Patient at this time is declining any further adjuvant anti=estrogen therapy. She feels she has done all she can and does not wish to go back on anything. She is also thinking about radiation therapy but has not definitely said wether or not she will take it.patient decided not to receive radiation therapy. So at this point she is not on anything.  #4 patient is very concerned about her quality of life in sexual health. She is asking me if she can be on some sort of a vaginal estrogen preparation. We discussed extensively the situation with her. She understands that if she uses the vaginal estrogen preparations she certainly would be taking the chance for absorption and possibly recurrence of disease. She understands her decision she is taking full responsibility of her actions.   PLAN:  #1  with great deal of her reluctance I recommended patient try Vagifem sparingly.  #2 I will see her back in 6 months time. She will need to continue doing self breast examinations as well as continuing mammograms.  All questions were answered. The patient knows to call the clinic with any problems, questions or concerns. We can certainly see the patient much sooner if necessary.  I spent 25 minutes counseling the patient  face to face. The total time spent in the appointment was 30 minutes.    Drue Second, MD Medical/Oncology [facility] [phone] (beeper) [phone] (Office)  06/24/2012, 5:20 PM

## 2012-07-19 ENCOUNTER — Ambulatory Visit (INDEPENDENT_AMBULATORY_CARE_PROVIDER_SITE_OTHER): Payer: PRIVATE HEALTH INSURANCE | Admitting: General Surgery

## 2012-07-19 ENCOUNTER — Encounter (INDEPENDENT_AMBULATORY_CARE_PROVIDER_SITE_OTHER): Payer: Self-pay | Admitting: General Surgery

## 2012-07-19 VITALS — BP 112/70 | HR 60 | Temp 97.6°F | Resp 16 | Ht 70.0 in | Wt 177.0 lb

## 2012-07-19 DIAGNOSIS — C50419 Malignant neoplasm of upper-outer quadrant of unspecified female breast: Secondary | ICD-10-CM

## 2012-07-19 DIAGNOSIS — C50412 Malignant neoplasm of upper-outer quadrant of left female breast: Secondary | ICD-10-CM

## 2012-07-19 NOTE — Patient Instructions (Signed)
The medication we discussed is tamoxifen.  Breast Self-Exam A self breast exam may help you find changes or problems while they are still small. Do a breast self-exam:  Every month.  One week after your period (menstrual period).  On the first day of each month if you do not have periods anymore. Look for any:  Change in breast color, size, or shape.  Dimples in your breast.  Changes in your nipples or skin.  Dry skin on your breasts or nipples.  Watery or bloody discharge from your nipples.  Feel for:  Lumps.  Thick, hard places.  Any other changes. HOME CARE There are 3 ways to do the breast self-exam: In front of a mirror.  Lift your arms over your head and turn side to side.  Put your hands on your hips and lean down, then turn from side to side.  Bend forward and turn from side to side. In the shower.  With soapy hands, check both breasts. Then check above and below your collarbone and your armpits.  Feel above and below your collarbone down to under your breast, and from the center of your chest to the outer edge of the armpit. Check for any lumps or hard spots.  Using the tips of your middle three fingers check your whole breast by pressing your hand over your breast in a circle or in an up and down motion. Lying down.  Lie flat on your bed.  Put a small pillow under the breast you are going to check. On that same side, put your hand behind your head.  With your other hand, use the 3 middle fingers to feel the breast.  Move your fingers in a circle around the breast. Press firmly over all parts of the breast to feel for any lumps. GET HELP RIGHT AWAY IF: You find any changes in your breasts so they can be checked. Document Released: 09/28/2007 Document Revised: 07/04/2011 Document Reviewed: 07/30/2008 [facility] Patient Information 2013 [place], [place].

## 2012-07-19 NOTE — Progress Notes (Signed)
Subjective:     Patient ID: Kelly Stuart, female   DOB: [birth year], 67 y.o.   MRN: [id number]  HPI This is a 67 year old female who was treated for a left breast cancer. Initially she wanted to consider options so we placed her on arimidex.  Eventually she has undergone a left sided lumpectomy (she declined a sentinel node biopsy) that showed a grade 1 1.1 cm IDC that is 100% er/pr positive, her 2 not amplified with Ki of 8%.  She has since declined radiation therapy and has declined to go back on antiestrogen therapy due to some bone/joint pain.  She has recently been seen by med onc and she has reaffirmed those decisions.  She is otherwise doing very well and has no complaints.    Review of Systems     Objective:   Physical Exam  Vitals reviewed. Constitutional: She appears well-developed and well-nourished.  Pulmonary/Chest: Right breast exhibits no inverted nipple, no mass, no nipple discharge, no skin change and no tenderness. Left breast exhibits no inverted nipple, no mass, no nipple discharge, no skin change and no tenderness.    Lymphadenopathy:    She has no cervical adenopathy.    She has no axillary adenopathy.       Right: No supraclavicular adenopathy present.       Left: No supraclavicular adenopathy present.       Assessment:     Left breast cancer s/p lumpectomy    Plan:     She has no clinical evidence of recurrence. I did send her for bilateral mm soon as she is due. I had long discussion about adjuvant therapy and why these are recommended.  She understands this well and does not want to do this now.  She is willing to consider at some point attempting antiestrogen therapy again but not now.  She will see me annually and I again encouraged her if she wanted to discuss adjuvant therapy again to please call me or Dr. Welton Flakes. She understands that her recurrence risk locally and systemically is significant.

## 2012-08-07 ENCOUNTER — Ambulatory Visit
Admission: RE | Admit: 2012-08-07 | Discharge: 2012-08-07 | Disposition: A | Discharge status: Home / Self Care | Payer: PRIVATE HEALTH INSURANCE | Source: Ambulatory Visit | Attending: General Surgery | Admitting: General Surgery

## 2012-08-07 DIAGNOSIS — H25049 Posterior subcapsular polar age-related cataract, unspecified eye: Secondary | ICD-10-CM | POA: Diagnosis not present

## 2012-08-07 DIAGNOSIS — H25019 Cortical age-related cataract, unspecified eye: Secondary | ICD-10-CM | POA: Diagnosis not present

## 2012-08-07 DIAGNOSIS — H2510 Age-related nuclear cataract, unspecified eye: Secondary | ICD-10-CM | POA: Diagnosis not present

## 2012-08-07 DIAGNOSIS — H18419 Arcus senilis, unspecified eye: Secondary | ICD-10-CM | POA: Diagnosis not present

## 2012-08-07 DIAGNOSIS — C50412 Malignant neoplasm of upper-outer quadrant of left female breast: Secondary | ICD-10-CM

## 2012-08-07 DIAGNOSIS — Z853 Personal history of malignant neoplasm of breast: Secondary | ICD-10-CM | POA: Diagnosis not present

## 2012-08-31 ENCOUNTER — Ambulatory Visit: Payer: Medicare Other

## 2012-08-31 ENCOUNTER — Ambulatory Visit (INDEPENDENT_AMBULATORY_CARE_PROVIDER_SITE_OTHER): Payer: Medicare Other | Admitting: Family Medicine

## 2012-08-31 VITALS — BP 122/72 | HR 90 | Temp 98.0°F | Resp 16 | Ht 70.0 in | Wt 174.0 lb

## 2012-08-31 DIAGNOSIS — M25529 Pain in unspecified elbow: Secondary | ICD-10-CM

## 2012-08-31 DIAGNOSIS — M25471 Effusion, right ankle: Secondary | ICD-10-CM

## 2012-08-31 DIAGNOSIS — M25571 Pain in right ankle and joints of right foot: Secondary | ICD-10-CM

## 2012-08-31 DIAGNOSIS — M25476 Effusion, unspecified foot: Secondary | ICD-10-CM

## 2012-08-31 DIAGNOSIS — M25473 Effusion, unspecified ankle: Secondary | ICD-10-CM | POA: Diagnosis not present

## 2012-08-31 NOTE — Patient Instructions (Signed)
RICE: Routine Care for Injuries The routine care of many injuries includes Rest, Ice, Compression, and Elevation (RICE). HOME CARE INSTRUCTIONS  Rest is needed to allow your body to heal. Routine activities can usually be resumed when comfortable. Injured tendons and bones can take up to 6 weeks to heal. Tendons are the cord-like structures that attach muscle to bone.  Ice following an injury helps keep the swelling down and reduces pain.  Put ice in a plastic bag.  Place a towel between your skin and the bag.  Leave the ice on for 15 to 20 minutes, 3 to 4 times a day. Do this while awake, for the first 24 to 48 hours. After that, continue as directed by your caregiver.  Compression helps keep swelling down. It also gives support and helps with discomfort. If an elastic bandage has been applied, it should be removed and reapplied every 3 to 4 hours. It should not be applied tightly, but firmly enough to keep swelling down. Watch fingers or toes for swelling, bluish discoloration, coldness, numbness, or excessive pain. If any of these problems occur, remove the bandage and reapply loosely. Contact your caregiver if these problems continue.  Elevation helps reduce swelling and decreases pain. With extremities, such as the arms, hands, legs, and feet, the injured area should be placed near or above the level of the heart, if possible. SEEK IMMEDIATE MEDICAL CARE IF:  You have persistent pain and swelling.  You develop redness, numbness, or unexpected weakness.  Your symptoms are getting worse rather than improving after several days. These symptoms may indicate that further evaluation or further X-rays are needed. Sometimes, X-rays may not show a small broken bone (fracture) until 1 week or 10 days later. Make a follow-up appointment with your caregiver. Ask when your X-ray results will be ready. Make sure you get your X-ray results. Document Released: 07/24/2000 Document Revised: 07/04/2011  Document Reviewed: 09/10/2010 [facility] Patient Information 2013 [place], [place].  See the handout on ankle sprain.  Ibuprofen over the counter as needed.  Recheck if not improving in next 2 weeks.

## 2012-08-31 NOTE — Progress Notes (Signed)
  Subjective:    Patient ID: Kelly Stuart, female    DOB: April 11, 1946, 67 y.o.   MRN: [id number]  HPI Kelly Stuart is a 67 y.o. female  During hot Yoga session yesterday at about 5 pm - during maneuver, R foot underneath. Lost balance, body rolled, and felt R ankle pop.  Able to wb, walk stairs, but swollen and sore. Feels same today.  No prior ankle injury. No hx of osteoporosis - bone density ok 2 years ago.   Tx: ice, elev'n.    Review of Systems  Constitutional: Negative for fever and chills.  Musculoskeletal: Positive for joint swelling and arthralgias. Negative for myalgias.  Skin: Negative for rash and wound.       Objective:   Physical Exam  Vitals reviewed. Constitutional: She appears well-developed and well-nourished. No distress.  Pulmonary/Chest: Effort normal.  Musculoskeletal:       Right ankle: She exhibits swelling. She exhibits normal range of motion, no ecchymosis, no deformity and normal pulse. Tenderness (mi ttp laterally over atf, with sts. slight laxity bilaterraly with drawer and talar tilt. ). Lateral malleolus and AITFL tenderness found. No CF ligament, no posterior TFL, no head of 5th metatarsal (also no navicular ttp. ) and no proximal fibula tenderness found.  Neurological: She is alert.  nvi distally to toes.   Skin: Skin is warm and dry. No rash noted.  Psychiatric: She has a normal mood and affect. Her behavior is normal.    [facility] reading (PRIMARY) by  Dr. Neva Seat: R ankle - ? Small lateral talar avulsion, sts.      Assessment & Plan:  Kelly Stuart is a 67 y.o. female Right ankle pain - Plan: DG Ankle Complete Right  Right ankle swelling - Plan: DG Ankle Complete Right R lateral ankle sprain with possible small lateral talar avulsion. theraband given. Ice/elevation today and tomorrow, sweedo brace as needed with activity, rtc precautions. HEP per handout on sports medicine database printed as pain subsides, rtc precautions.

## 2012-09-25 ENCOUNTER — Encounter: Payer: Self-pay | Admitting: *Deleted

## 2012-09-27 ENCOUNTER — Encounter: Payer: Self-pay | Admitting: Obstetrics & Gynecology

## 2012-09-27 ENCOUNTER — Ambulatory Visit (INDEPENDENT_AMBULATORY_CARE_PROVIDER_SITE_OTHER): Payer: Medicare Other | Admitting: Obstetrics & Gynecology

## 2012-09-27 ENCOUNTER — Telehealth: Payer: Self-pay | Admitting: Obstetrics & Gynecology

## 2012-09-27 VITALS — BP 146/80 | Ht 69.25 in | Wt 173.2 lb

## 2012-09-27 DIAGNOSIS — Z124 Encounter for screening for malignant neoplasm of cervix: Secondary | ICD-10-CM

## 2012-09-27 DIAGNOSIS — Z01419 Encounter for gynecological examination (general) (routine) without abnormal findings: Secondary | ICD-10-CM

## 2012-09-27 NOTE — Patient Instructions (Signed)

## 2012-09-27 NOTE — Progress Notes (Signed)
67 y.o. No obstetric history on file. DivorcedCaucasianF here for annual exam.  Did have lumpectomy in 11/13.  1.1cm.  Declined sentinel node and radiation.  Took Arimidex about a year.  Has stopped due to side effects.  She does not want any other therapy.  She is having some vaginal dryness.  Dr. Welton Flakes wrote Rx for Vagifem.  She agreed to pt using this twice monthly.  She does occasionally use Replens and vaginal Vitamin D.  May end up using coconut oil.  No vaginal bleeding.  Now doing full time massage therapy.  Took four days off for surgery.  Retired from Midwife.  Really happy with life changes.  Patient's last menstrual period was 08/23/2008.          Sexually active: yes  The current method of family planning is none.    Exercising: yes  gym Smoker:  no  Health Maintenance: Pap:  09/08/11 WNL History of abnormal Pap:  no MMG:  08/06/12 yearly diag Colonoscopy:  2012 repeat in 10 years BMD:   09/10/10 normal TDaP:  2000 Screening Labs: declined, Hb today: declined, Urine today: declined   reports that she quit smoking about 20 years ago. Her smoking use included Cigarettes. She smoked 2.00 packs per day. She has never used smokeless tobacco. She reports that  drinks alcohol. She reports that she does not use illicit drugs.  Past Medical History  Diagnosis Date  . Breast cancer     ER+PR+ HER-2NEU negative    Past Surgical History  Procedure Laterality Date  . Tonsillectomy    . Maxillofacial surgery  1978    mouth-ealign teeth-  . Facial cosmetic surgery  03    facial  . Eye surgery  1999    blepheroplasty   . Colonoscopy    . Breast lumpectomy with needle localization  02/29/2012    Procedure: BREAST LUMPECTOMY WITH NEEDLE LOCALIZATION;  Surgeon: Emelia Loron, MD;  Location: [facility];  Service: General;  Laterality: Left;  . Breast surgery      lumpectomy  . Cosmetic surgery      Current Outpatient Prescriptions  Medication Sig Dispense  Refill  . cholecalciferol (VITAMIN D) 1000 UNITS tablet Take 1,000 Units by mouth daily.      . Multiple Vitamin (MULTIVITAMIN) tablet Take 1 tablet by mouth daily.      Marland Kitchen BESIVANCE 0.6 % SUSP Prior to cataract surgery      . VAGIFEM 10 MCG TABS        No current facility-administered medications for this visit.    Family History  Problem Relation Age of Onset  . Cancer Mother   . Colon cancer Mother   . Cancer Father   . Colon cancer Father   . Rheumatic fever Sister   . Hepatitis Father   . Hiatal hernia Father     ROS:  Pertinent items are noted in HPI.  Otherwise, a comprehensive ROS was negative.  Exam:   BP 146/80  Ht 5' 9.25" (1.759 m)  Wt 173 lb 3.2 oz (78.563 kg)  BMI 25.39 kg/m2  LMP 08/23/2008  Weight change: -1lb   Height: 5' 9.25" (175.9 cm)  Ht Readings from Last 3 Encounters:  09/27/12 5' 9.25" (1.759 m)  08/31/12 5\' 10"  (1.778 m)  07/19/12 5\' 10"  (1.778 m)    General appearance: alert, cooperative and appears stated age Head: Normocephalic, without obvious abnormality, atraumatic Neck: no adenopathy, supple, symmetrical, trachea midline and thyroid normal to  inspection and palpation Lungs: clear to auscultation bilaterally Breasts: normal appearance, no masses or tenderness, well healed scar left upper outer quadrant Heart: regular rate and rhythm Abdomen: soft, non-tender; bowel sounds normal; no masses,  no organomegaly Extremities: extremities normal, atraumatic, no cyanosis or edema Skin: Skin color, texture, turgor normal. No rashes or lesions Lymph nodes: Cervical, supraclavicular, and axillary nodes normal. No abnormal inguinal nodes palpated Neurologic: Grossly normal   Pelvic: External genitalia:  no lesions              Urethra:  normal appearing urethra with no masses, tenderness or lesions              Bartholins and Skenes: normal                 Vagina: normal appearing vagina with normal color and discharge, no lesions               Cervix: no lesions              Pap taken: no Bimanual Exam:  Uterus:  normal size, contour, position, consistency, mobility, non-tender              Adnexa: normal adnexa and no mass, fullness, tenderness               Rectovaginal: Confirms               Anus:  normal sphincter tone, no lesions  A:  Well Woman with normal exam H/O 1.1cm invasive ductal Ca, patient declines additional therapy PMP, no HRT Vaginal atrophic changes  P:   Mammogram yearly.   pap smear today return annually or prn  An After Visit Summary was printed and given to the patient.

## 2012-09-27 NOTE — Telephone Encounter (Signed)
Patient sent over some forms for a prescription. She just talked to them and she needs you to explain why she needs to take this medication instead of what they suggested. She wants to know if you can put 31 tablets. She has to pay $90 every time she picks up this medication. It would be $90/ 31 tablets which will last her a full year instead of $90/8 tablet and then have to pick up refills and pay $90 each time.

## 2012-10-01 LAB — IPS PAP SMEAR ONLY

## 2012-10-01 NOTE — Telephone Encounter (Signed)
Fax  [phone] - vagifem is not preferred drug, estrodial is reccommended.  Patient says she cannot take estrodial due to her history. Patient needs a letter sent to her pharmacy stating she needs vagifem.

## 2012-10-02 ENCOUNTER — Encounter: Payer: Self-pay | Admitting: Oncology

## 2012-10-02 NOTE — Telephone Encounter (Signed)
Forms and chart in your office for [facility] to approve Vagifem medication. sue

## 2012-10-02 NOTE — Telephone Encounter (Signed)
Forms faxed to [employer] for Vagifem to fax # per patient.

## 2012-10-02 NOTE — Progress Notes (Signed)
[employer], [id number], approved vagifem from 08/09/12-08/09/13.

## 2012-10-05 ENCOUNTER — Telehealth: Payer: Self-pay | Admitting: *Deleted

## 2012-10-05 NOTE — Telephone Encounter (Signed)
[employer] prior approval for vagifem approved.

## 2012-10-05 NOTE — Telephone Encounter (Signed)
Please let pt know

## 2012-10-09 NOTE — Telephone Encounter (Signed)
Left a voice message telling pt. Prior Approval was approved to call her Pharmacy.

## 2012-10-12 ENCOUNTER — Other Ambulatory Visit: Payer: Self-pay | Admitting: *Deleted

## 2012-10-15 ENCOUNTER — Other Ambulatory Visit: Payer: Self-pay | Admitting: Medical Oncology

## 2012-10-15 DIAGNOSIS — IMO0002 Reserved for concepts with insufficient information to code with codable children: Secondary | ICD-10-CM | POA: Diagnosis not present

## 2012-10-15 DIAGNOSIS — H52209 Unspecified astigmatism, unspecified eye: Secondary | ICD-10-CM | POA: Diagnosis not present

## 2012-10-15 DIAGNOSIS — H2510 Age-related nuclear cataract, unspecified eye: Secondary | ICD-10-CM | POA: Diagnosis not present

## 2012-10-15 DIAGNOSIS — H269 Unspecified cataract: Secondary | ICD-10-CM | POA: Diagnosis not present

## 2012-10-15 NOTE — Telephone Encounter (Signed)
F/U call to [facility] fax for prescription refill request for pt's Rx of Vagifem, per pharm rep patient has refills currently on prescription and patient p.u. 8 in February. No action taken at this time.

## 2012-10-16 DIAGNOSIS — H2510 Age-related nuclear cataract, unspecified eye: Secondary | ICD-10-CM | POA: Diagnosis not present

## 2012-10-18 ENCOUNTER — Telehealth: Payer: Self-pay

## 2012-10-18 NOTE — Telephone Encounter (Signed)
Patient notified, will call back PRN.

## 2012-10-18 NOTE — Telephone Encounter (Signed)
Patient informed her Vagifem has been approved x1 year. She is c/o medication being $97.00 for 3 tablets and asking if we can write a new RX for using 2-3 x monthly instead of every other month? Also, asking for a PCP name that Dr Hyacinth Meeker recommends that would take medicare. Please advise.

## 2012-10-18 NOTE — Telephone Encounter (Signed)
I can write the RX as would typically be used, one vaginally twice weekly and see if they would fill a 3 month supply which would be #24.  I will order at [facility].  She needs to know it will be in the electronic record as that way but she needs to make sure when she goes for her oncology appt that they are aware she is only doing it as advised.  Sharlet Salina, MD.  I am not %100 sure she take Medicare but we can refer her.

## 2012-10-22 DIAGNOSIS — IMO0002 Reserved for concepts with insufficient information to code with codable children: Secondary | ICD-10-CM | POA: Diagnosis not present

## 2012-10-29 DIAGNOSIS — H269 Unspecified cataract: Secondary | ICD-10-CM | POA: Diagnosis not present

## 2012-10-29 DIAGNOSIS — IMO0002 Reserved for concepts with insufficient information to code with codable children: Secondary | ICD-10-CM | POA: Diagnosis not present

## 2012-10-29 DIAGNOSIS — H2510 Age-related nuclear cataract, unspecified eye: Secondary | ICD-10-CM | POA: Diagnosis not present

## 2012-10-29 DIAGNOSIS — H52209 Unspecified astigmatism, unspecified eye: Secondary | ICD-10-CM | POA: Diagnosis not present

## 2012-11-14 ENCOUNTER — Telehealth: Payer: Self-pay | Admitting: Obstetrics & Gynecology

## 2012-11-14 NOTE — Telephone Encounter (Signed)
Patient informed of PCP that Dr. Hyacinth Meeker had suggested as in phone note to patient on 06/262014. Dr. Eden Emms Baxley. Patient concern of Rx cost for Vagifem and states she is going to call [employer] to see what her co pay will be for 90 day supply as ordered for 2 x week. Patient will call back with information as to what she decides to use as to how much her co pay will be.

## 2012-11-14 NOTE — Telephone Encounter (Signed)
Patient forgot the name of PCP Dr. Hyacinth Meeker had suggested. Patient needs a new prescription for vagifem. [employer] will approve up to 30 days. [facility] at [address]

## 2012-11-16 ENCOUNTER — Other Ambulatory Visit: Payer: Self-pay | Admitting: Orthopedic Surgery

## 2012-11-16 ENCOUNTER — Other Ambulatory Visit: Payer: Self-pay | Admitting: Obstetrics & Gynecology

## 2012-11-16 MED ORDER — VAGIFEM 10 MCG VA TABS: ORAL_TABLET | VAGINAL | Status: DC

## 2012-11-16 MED ORDER — ESTRADIOL 10 MCG VA TABS: 1.0000 | ORAL_TABLET | VAGINAL | Status: DC

## 2012-11-16 NOTE — Telephone Encounter (Signed)
Pt had Aex 09-27-12. Dr. Welton Flakes wrote original Rx for Vagifem. Do you want to refill?

## 2012-11-16 NOTE — Telephone Encounter (Signed)
Pt would like to go ahead with the 3 month supply mail order and would like to pick up the rx on Monday.

## 2012-11-16 NOTE — Telephone Encounter (Signed)
Kelly Stuart will put RX at front for her to pick up.

## 2012-12-06 ENCOUNTER — Telehealth: Payer: Self-pay | Admitting: *Deleted

## 2012-12-06 NOTE — Telephone Encounter (Signed)
Lm informed the pt that kk will be out of the office on 12/13/12. gv appt d/t for 01/15/13@ 11:00am. i made the pt aware that i will mail a letter/avs...td

## 2012-12-13 ENCOUNTER — Ambulatory Visit: Payer: PRIVATE HEALTH INSURANCE | Admitting: Oncology

## 2013-01-15 ENCOUNTER — Encounter: Payer: Self-pay | Admitting: Oncology

## 2013-01-15 ENCOUNTER — Telehealth: Payer: Self-pay | Admitting: Oncology

## 2013-01-15 ENCOUNTER — Ambulatory Visit (HOSPITAL_BASED_OUTPATIENT_CLINIC_OR_DEPARTMENT_OTHER): Payer: Medicare Other | Admitting: Oncology

## 2013-01-15 DIAGNOSIS — Z170 Estrogen receptor positive status [ER+]: Secondary | ICD-10-CM | POA: Diagnosis not present

## 2013-01-15 DIAGNOSIS — C50412 Malignant neoplasm of upper-outer quadrant of left female breast: Secondary | ICD-10-CM

## 2013-01-15 DIAGNOSIS — C50419 Malignant neoplasm of upper-outer quadrant of unspecified female breast: Secondary | ICD-10-CM | POA: Diagnosis not present

## 2013-01-15 NOTE — Patient Instructions (Addendum)
Doing well  We will continue to see you back in 6 months 

## 2013-01-15 NOTE — Progress Notes (Signed)
OFFICE PROGRESS NOTE  CC Dr. Eulis Foster  Dr. Antony Blackbird  Dr. Emelia Loron  DIAGNOSIS:  67 year old female with new diagnosis invasive ductal carcinoma of the left breast. The tumor was grade 1 estrogen receptor +100% progesterone receptor +100% proliferation marker 8% HER-2/neu negative  PRIOR THERAPY:  1. S/P core needle biopsy with the pathology showing a grade 1 ER+ IDC (T2N0) of the lest breast  2. Begun on neoadjuvant arimidex 1 mg daily since 05/18/11 - 02/2012  3. S/p Lumpectomy on 02/29/12 without a sentinel node procedure (patient declined). The final pathology showed a 1.1 cm well differentiated invasive ductal carcinoma ER+ Her2 negative  4. She has seen Dr. Roselind Messier for consideration of post lumpectomy radiation but patient declined.she has also declined any kind of antiestrogen therapy.  CURRENT THERAPY: observation  INTERVAL HISTORY: Kelly Stuart 67 y.o. female returns for follow up visit. Patient is doing well post. She has no real complaints, no fevers or chills, no pain, no swelling in her arms or breast. Remainder of the 10 point review of systems is negative.  MEDICAL HISTORY: Past Medical History  Diagnosis Date  . Breast cancer     ER+PR+ HER-2NEU negative    ALLERGIES:  is allergic to codeine; tetracyclines & related; penicillins; and sulfa antibiotics.  MEDICATIONS:  Current Outpatient Prescriptions  Medication Sig Dispense Refill  . BESIVANCE 0.6 % SUSP Prior to cataract surgery      . cholecalciferol (VITAMIN D) 1000 UNITS tablet Take 1,000 Units by mouth daily.      . Estradiol 10 MCG TABS vaginal tablet Place 1 tablet (10 mcg total) vaginally 2 (two) times a week.  8 tablet  11  . Multiple Vitamin (MULTIVITAMIN) tablet Take 1 tablet by mouth daily.       No current facility-administered medications for this visit.    SURGICAL HISTORY:  Past Surgical History  Procedure Laterality Date  . Tonsillectomy    . Maxillofacial surgery  1980     mouth-ealign teeth-  . Facelift w/blepharoplasty  1999       . Minifacelift  2007  . Breast lumpectomy with needle localization  02/29/2012    Procedure: BREAST LUMPECTOMY WITH NEEDLE LOCALIZATION;  Surgeon: Emelia Loron, MD;  Location: [facility];  Service: General;  Laterality: Left;  . Dilation and curettage of uterus  1998    submucus myoma-resected    REVIEW OF SYSTEMS:  Pertinent items are noted in HPI.   PHYSICAL EXAMINATION: General appearance: alert, cooperative and appears stated age Lymph nodes: Cervical, supraclavicular, and axillary nodes normal. Resp: clear to auscultation bilaterally and normal percussion bilaterally Cardio: regular rate and rhythm, S1, S2 normal, no murmur, click, rub or gallop GI: soft, non-tender; bowel sounds normal; no masses,  no organomegaly Extremities: extremities normal, atraumatic, no cyanosis or edema Neurologic: Alert and oriented X 3, normal strength and tone. Normal symmetric reflexes. Normal coordination and gait Bilateral breast examination is performed: Right breast no masses nipple discharge or palpable masses or skin changes or nipple inversion.Left Breast: healed incisional scar no masses or tenderness.  ECOG PERFORMANCE STATUS: 0 - Asymptomatic  Last menstrual period 08/23/2008.  LABORATORY DATA: Lab Results  Component Value Date   WBC 6.9 06/12/2012   HGB 13.0 06/12/2012   HCT 39.1 06/12/2012   MCV 98.1 06/12/2012   PLT 246 06/12/2012      Chemistry      Component Value Date/Time   NA 142 06/12/2012 1411   NA 137 02/28/2012  1030   K 3.9 06/12/2012 1411   K 4.3 02/28/2012 1030   CL 106 06/12/2012 1411   CL 100 02/28/2012 1030   CO2 26 06/12/2012 1411   CO2 28 02/28/2012 1030   BUN 15.8 06/12/2012 1411   BUN 12 02/28/2012 1030   CREATININE 0.8 06/12/2012 1411   CREATININE 0.70 02/28/2012 1030      Component Value Date/Time   CALCIUM 9.8 06/12/2012 1411   CALCIUM 9.7 02/28/2012 1030   ALKPHOS 69 06/12/2012  1411   ALKPHOS 52 09/05/2011 0903   AST 17 06/12/2012 1411   AST 21 09/05/2011 0903   ALT 15 06/12/2012 1411   ALT 14 09/05/2011 0903   BILITOT 0.65 06/12/2012 1411   BILITOT 0.7 09/05/2011 0903    ADDITIONAL INFORMATION: CHROMOGENIC IN-SITU HYBRIDIZATION Interpretation HER-2/NEU BY CISH - NO AMPLIFICATION OF HER-2 DETECTED. THE RATIO OF HER-2: CEP 17 SIGNALS WAS 1.21. Reference range: Ratio: HER2:CEP17 < 1.8 - gene amplification not observed Ratio: HER2:CEP 17 1.8-2.2 - equivocal result Ratio: HER2:CEP17 > 2.2 - gene amplification observed Pecola Leisure MD Pathologist, Electronic Signature ( Signed 03/06/2012) FINAL DIAGNOSIS Diagnosis Breast, lumpectomy, Left - INVASIVE DUCTAL CARCINOMA, 1.1 CM. - DUCTAL CARCINOMA IN SITU. - MARGINS NOT INVOLVED. - CLOSEST MARGIN SUPERIOR AT 0.2 CM. Microscopic Comment BREAST, INVASIVE TUMOR, WITH LYMPH NODE SAMPLING Specimen, including laterality: Left breast. Procedure: Needle localized lumpectomy. Grade: I. 1 of 3 FINAL for Stuart, Kelly ([id number]) Microscopic Comment(continued) Tubule formation: 1. Nuclear pleomorphism: 2. Mitotic: 1. Tumor size (gross measurement): 1.1 cm. Margins: Free of tumor. Invasive, distance to closest margin: 0.2 cm from superior margin. In-situ, distance to closest margin: 0.8 cm from superior margin. If margin positive, focally or broadly: N/A. Lymphovascular invasion: No. Ductal carcinoma in situ: Present. Grade: Low grade. Extensive intraductal component: No. Lobular neoplasia: No. Tumor focality: Unifocal. Treatment effect: No. If present, treatment effect in breast tissue, lymph nodes or both: N/A. Extent of tumor: Skin: N/A. Nipple: N/A. Skeletal muscle: N/A. Lymph nodes: # examined: 0. Lymph nodes with metastasis: N/A. Isolated tumor cells (< 0.2 mm): N/A. Micrometastasis: (> 0.2 mm and < 2.0 mm): N/A. Macrometastasis: (> 2.0 mm): N/A. Extracapsular extension: N/A. Breast prognostic  profile: Case #[id number]: Estrogen receptor: 100%, positive. Progesterone receptor: 100%, positive. HER-2/neu by CISH: No amplification, ratio 1.19. Will be repeated on current specimen. Ki-67: 8%. Non-neoplastic breast: Fibrocystic changes with calcifications. TNM: pT1c, pNX, pMX. (JDP:eps 03/02/12) Jimmy Picket MD Pathologist, Electronic Signature (Case signed 03/02/2012) Specimen Gross and Clinical Information Specimen(s) Obtained: Breast, lumpectomy, Left Specimen Clinical   RADIOGRAPHIC STUDIES:  ASSESSMENT: 67 year old female with  #1 stage II A. Invasive ductal carcinoma that was grade 1 estrogen receptor +100% progesterone receptor +100% proliferation marker 8% and HER-2/neu negative diagnosed in January 2013.She is status post neoadjuvant arimidex for about 9 months.   #2 She has undergone lumpectomy without sentinel lymph node biopsy. Final pathology showed a 1.1 cm residual disease. She is doing well post op  3. Patient at this time is declining any further adjuvant anti=estrogen therapy. She feels she has done all she can and does not wish to go back on anything. She is also thinking about radiation therapy but has not definitely said wether or not she will take it.patient decided not to receive radiation therapy. So at this point she is not on anything.  #4 patient is very concerned about her quality of life in sexual health. She is asking me if she can be on some sort of a  vaginal estrogen preparation. We discussed extensively the situation with her. She understands that if she uses the vaginal estrogen preparations she certainly would be taking the chance for absorption and possibly recurrence of disease. She understands her decision she is taking full responsibility of her actions.   PLAN:  #1 overall patient is doing well she has no clinical evidence of recurrent disease. She is very comfortable with her decision is regarding her treatments.  #2 I will see her back in  6 months time for followup  All questions were answered. The patient knows to call the clinic with any problems, questions or concerns. We can certainly see the patient much sooner if necessary.  I spent 15 minutes counseling the patient face to face. The total time spent in the appointment was 20 minutes.    Drue Second, MD Medical/Oncology [facility] [phone] (beeper) [phone] (Office)  01/15/2013, 11:58 AM

## 2013-07-15 ENCOUNTER — Telehealth: Payer: Self-pay | Admitting: Obstetrics & Gynecology

## 2013-07-15 ENCOUNTER — Telehealth: Payer: Self-pay | Admitting: Adult Health

## 2013-07-15 NOTE — Telephone Encounter (Signed)
Left patient a message about questions she has about her insurance.

## 2013-07-15 NOTE — Telephone Encounter (Signed)
, °

## 2013-07-16 ENCOUNTER — Other Ambulatory Visit (HOSPITAL_BASED_OUTPATIENT_CLINIC_OR_DEPARTMENT_OTHER): Payer: Medicare Other

## 2013-07-16 ENCOUNTER — Ambulatory Visit: Payer: Medicare Other | Admitting: Oncology

## 2013-07-16 ENCOUNTER — Other Ambulatory Visit: Payer: Medicare Other

## 2013-07-16 ENCOUNTER — Encounter: Payer: Self-pay | Admitting: Adult Health

## 2013-07-16 ENCOUNTER — Ambulatory Visit (HOSPITAL_BASED_OUTPATIENT_CLINIC_OR_DEPARTMENT_OTHER): Payer: Medicare Other | Admitting: Adult Health

## 2013-07-16 VITALS — BP 148/79 | HR 83 | Temp 98.3°F | Resp 18 | Ht 69.25 in | Wt 177.8 lb

## 2013-07-16 DIAGNOSIS — C50419 Malignant neoplasm of upper-outer quadrant of unspecified female breast: Secondary | ICD-10-CM

## 2013-07-16 DIAGNOSIS — Z853 Personal history of malignant neoplasm of breast: Secondary | ICD-10-CM

## 2013-07-16 DIAGNOSIS — C50412 Malignant neoplasm of upper-outer quadrant of left female breast: Secondary | ICD-10-CM

## 2013-07-16 LAB — CBC WITH DIFFERENTIAL/PLATELET
BASO%: 0.6 % (ref 0.0–2.0)
BASOS ABS: 0 10*3/uL (ref 0.0–0.1)
EOS ABS: 0.1 10*3/uL (ref 0.0–0.5)
EOS%: 2.5 % (ref 0.0–7.0)
HCT: 38.6 % (ref 34.8–46.6)
HEMOGLOBIN: 12.8 g/dL (ref 11.6–15.9)
LYMPH%: 39.7 % (ref 14.0–49.7)
MCH: 32.9 pg (ref 25.1–34.0)
MCHC: 33.2 g/dL (ref 31.5–36.0)
MCV: 99.2 fL (ref 79.5–101.0)
MONO#: 0.5 10*3/uL (ref 0.1–0.9)
MONO%: 8.6 % (ref 0.0–14.0)
NEUT%: 48.6 % (ref 38.4–76.8)
NEUTROS ABS: 2.5 10*3/uL (ref 1.5–6.5)
PLATELETS: 233 10*3/uL (ref 145–400)
RBC: 3.89 10*6/uL (ref 3.70–5.45)
RDW: 13.3 % (ref 11.2–14.5)
WBC: 5.2 10*3/uL (ref 3.9–10.3)
lymph#: 2.1 10*3/uL (ref 0.9–3.3)

## 2013-07-16 LAB — COMPREHENSIVE METABOLIC PANEL (CC13)
ALBUMIN: 3.8 g/dL (ref 3.5–5.0)
ALK PHOS: 63 U/L (ref 40–150)
ALT: 17 U/L (ref 0–55)
AST: 16 U/L (ref 5–34)
Anion Gap: 10 mEq/L (ref 3–11)
BUN: 12.8 mg/dL (ref 7.0–26.0)
CO2: 24 mEq/L (ref 22–29)
Calcium: 9.4 mg/dL (ref 8.4–10.4)
Chloride: 107 mEq/L (ref 98–109)
Creatinine: 0.8 mg/dL (ref 0.6–1.1)
Glucose: 120 mg/dl (ref 70–140)
POTASSIUM: 4.5 meq/L (ref 3.5–5.1)
Sodium: 142 mEq/L (ref 136–145)
TOTAL PROTEIN: 7.5 g/dL (ref 6.4–8.3)
Total Bilirubin: 0.6 mg/dL (ref 0.20–1.20)

## 2013-07-16 NOTE — Progress Notes (Addendum)
Hematology and Oncology Follow Up Visit  Hansika Leaming [id number] 05/31/1945 68 y.o. 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     Prior Therapy:    1. S/P core needle biopsy with the pathology showing a grade 1 ER+ IDC (T2N0) of the lest breast   2. Begun on neoadjuvant arimidex 1 mg daily since 05/18/11 - 02/2012   3. S/p Lumpectomy on 02/29/12 without a sentinel node procedure (patient declined). The final pathology showed a 1.1 cm well differentiated invasive ductal carcinoma ER+ Her2 negative   4. She has seen Dr. Sondra Come for consideration of post lumpectomy radiation but patient declined.she has also declined any kind of antiestrogen therapy.   Current therapy: observation  Interim History:  Patient is a 68 year old female with left breast invasive ductal carcinoma who is here for follow up and evaluation.  She is doing well today.  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  She denies fevers, chills, night sweats, unintentional weight loss, or any further concerns.  We reviewed her health maintenance below.  A 10 point ROS is otherwise negative.   Medications:  Current Outpatient Prescriptions  Medication Sig Dispense Refill  . Ascorbic Acid (VITAMIN C) 100 MG tablet Take 200 mg by mouth daily.      . cholecalciferol (VITAMIN D) 1000 UNITS tablet Take 1,000 Units by mouth daily.      . Estradiol 10 MCG TABS vaginal tablet Place 1 tablet (10 mcg total) vaginally 2 (two) times a week.  8 tablet  11  . Multiple Vitamin (MULTIVITAMIN) tablet Take 1 tablet by mouth daily.      Marland Kitchen UNABLE TO FIND Take 1 capsule by mouth 2 (two) times daily. Pueraria Mirfica Supplement       No current facility-administered medications for this visit.     Allergies:  Allergies  Allergen Reactions  . Codeine Nausea And Vomiting  .  Tetracyclines & Related     Patient states Acromycin  . Penicillins Rash  . Sulfa Antibiotics Rash    Past Medical History, Surgical history, Social history, and Family History were reviewed and updated.  Review of Systems: A 10 point review of systems was conducted and is otherwise negative except for what is noted above.    Health Maintenance  Mammogram: 07/28/2012, due to be scheduled Colonoscopy: 2013 with 5 year f/u recommended Bone Density Scan: 09/10/2010, normal Pap Smear: 09/27/2012 Eye Exam: 2014 Vitamin D Level: PCP Dr. Sabra Heck follows Lipid Panel: PCP Dr. Sabra Heck follows, unsure when done last  Physical Exam: Blood pressure 148/79, pulse 83, temperature 98.3 F (36.8 C), temperature source Oral, resp. rate 18, height 5' 9.25" (1.759 m), weight 177 lb 12.8 oz (80.65 kg), last menstrual period 08/23/2008. GENERAL: Patient is a well appearing female in no acute distress HEENT:  Sclerae anicteric.  Oropharynx clear and moist. No ulcerations or evidence of oropharyngeal candidiasis. Neck is supple.  NODES:  No cervical, supraclavicular, or axillary lymphadenopathy palpated.  BREAST EXAM: left breast lumpectomy site without nodularity or mass, right breast no masses, nodules or lesions, no skin changes, benign breast exam.   LUNGS:  Clear to auscultation bilaterally.  No wheezes or rhonchi. HEART:  Regular rate and rhythm. No murmur appreciated. ABDOMEN:  Soft, nontender.  Positive, normoactive bowel sounds. No organomegaly palpated. MSK:  No focal spinal tenderness  to palpation. Full range of motion bilaterally in the upper extremities. EXTREMITIES:  No peripheral edema.   SKIN:  Clear with no obvious rashes or skin changes. No nail dyscrasia. NEURO:  Nonfocal. Well oriented.  Appropriate affect. ECOG: 0      Lab Results: Lab Results  Component Value Date   WBC 5.2 07/16/2013   HGB 12.8 07/16/2013   HCT 38.6 07/16/2013   MCV 99.2 07/16/2013   PLT 233 07/16/2013      Chemistry      Component Value Date/Time   NA 142 07/16/2013 0934   NA 137 02/28/2012 1030   K 4.5 07/16/2013 0934   K 4.3 02/28/2012 1030   CL 106 06/12/2012 1411   CL 100 02/28/2012 1030   CO2 24 07/16/2013 0934   CO2 28 02/28/2012 1030   BUN 12.8 07/16/2013 0934   BUN 12 02/28/2012 1030   CREATININE 0.8 07/16/2013 0934   CREATININE 0.70 02/28/2012 1030      Component Value Date/Time   CALCIUM 9.4 07/16/2013 0934   CALCIUM 9.7 02/28/2012 1030   ALKPHOS 63 07/16/2013 0934   ALKPHOS 52 09/05/2011 0903   AST 16 07/16/2013 0934   AST 21 09/05/2011 0903   ALT 17 07/16/2013 0934   ALT 14 09/05/2011 0903   BILITOT 0.60 07/16/2013 0934   BILITOT 0.7 09/05/2011 0903       Assessment and Plan:   Patient is a 68 year old female with  1.  H/o invasive ductal carcinoma of the left breast.  She has underwent neoadjuvant anti-estrogen therapy, followed by lumpectomy.  She declined adjuvant radiation and adjuvant anti-estrogen therapy.  She continues to decline anti-estrogen therapy today.    2.  I ordered her mammogram today.  She has no sign of recurrence.  Her CBC is stable, I reviewed it with her in detail.  Her CMP is pending.  I counseled her on survivorship including staying up to date with her health maintenance, healthy diet, exercise, and monthly breast exams.  She will return to the office in 6 months for labs and evaluation.  She knows to call us in the interim if she has any questions or concerns and we will see her sooner if needed.    I spent 25 minutes counseling the patient face to face.  The total time spent in the appointment was 30 minutes.  Minette Headland, [place] [phone]  3/25/201512:49 PM   ADDENDUM:    I personally saw this patient and performed a substantive portion of this encounter with the listed APP documented above.   68 year old female with invasive ductal carcinoma of the left breast. Initially patient received  neoadjuvant antiestrogen therapy followed by lumpectomy. Postoperatively patient did find any kind of further treatment including radiation and antiestrogen therapy. Overall she continues to do well. She is doing a lot of complimentary therapies including hot yoga. Patient is also very excited to try to get other people involved with massage therapy. 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  Patient will continue to see [place] every 6 months time. Eventually I would like to see her only once a year.   Marcy Panning, MD

## 2013-07-17 ENCOUNTER — Telehealth: Payer: Self-pay | Admitting: Oncology

## 2013-07-17 NOTE — Telephone Encounter (Signed)
Patient is calling starla back

## 2013-07-22 NOTE — Telephone Encounter (Signed)
LMTCB/[place] °

## 2013-07-23 NOTE — Telephone Encounter (Signed)
LMTCB/[place] °

## 2013-07-24 NOTE — Telephone Encounter (Signed)
Lmtcb/[place] Patient is inquiring about her Medicare coverage for an AEX/[place]

## 2013-07-29 NOTE — Telephone Encounter (Signed)
lmtcb/[place] °

## 2013-07-29 NOTE — Telephone Encounter (Signed)
Spoke with patient. She reports she wants to come and see Dr. Sabra Heck for minor bladder concerns. Patient only wants a consult with Dr. Sabra Heck at this time and states it is not urgent or anything like an infection. Patient scheduled for 08/13/13 with Dr. Sabra Heck. Okay to close encounter if no further action is necessary.

## 2013-08-13 ENCOUNTER — Ambulatory Visit (INDEPENDENT_AMBULATORY_CARE_PROVIDER_SITE_OTHER): Payer: Medicare Other | Admitting: Obstetrics & Gynecology

## 2013-08-13 ENCOUNTER — Encounter: Payer: Self-pay | Admitting: Obstetrics & Gynecology

## 2013-08-13 VITALS — BP 142/82 | HR 72 | Resp 16 | Wt 179.0 lb

## 2013-08-13 DIAGNOSIS — R3915 Urgency of urination: Secondary | ICD-10-CM | POA: Diagnosis not present

## 2013-08-13 LAB — POCT URINALYSIS DIPSTICK
Bilirubin, UA: NEGATIVE
Glucose, UA: NEGATIVE
KETONES UA: NEGATIVE
Leukocytes, UA: NEGATIVE
Nitrite, UA: NEGATIVE
PH UA: 5
PROTEIN UA: NEGATIVE
RBC UA: NEGATIVE
UROBILINOGEN UA: NEGATIVE

## 2013-08-13 MED ORDER — NITROFURANTOIN MONOHYD MACRO 100 MG PO CAPS: 100.0000 mg | ORAL_CAPSULE | Freq: Two times a day (BID) | ORAL | Status: DC

## 2013-08-13 NOTE — Progress Notes (Signed)
Subjective:     Patient ID: Kelly Stuart, female   DOB: [birth year], 68 y.o.   MRN: [id number]  HPI 68 yo adopted 1 DWF here with about a week of urinary pressure and increased urgency.  No blood in urine.  No fever.  No back pain.  Did hot yoga and hydrated really well and the symptoms began to improve.  Pt thought about cancelling appt as she is feeling much better.  No vaginal bleeding.  Pt wonders if she has any prolapse.    Review of Systems  Genitourinary: Positive for urgency (mild).  All other systems reviewed and are negative.      Objective:   Physical Exam  Constitutional: She appears well-developed and well-nourished.  Abdominal: Soft. Bowel sounds are normal.  Genitourinary: Vagina normal and uterus normal.  No prolapse. No suprapubic pain.  No flank pain.       Assessment:     Urinary frequency    Plan:     Macrobid 100mg  bid x 7 days.  #14/0RF Urine culture pending

## 2013-08-14 LAB — URINE CULTURE
Colony Count: NO GROWTH
Organism ID, Bacteria: NO GROWTH

## 2013-08-15 ENCOUNTER — Telehealth: Payer: Self-pay

## 2013-08-15 NOTE — Telephone Encounter (Signed)
Patient is calling kelly back

## 2013-08-15 NOTE — Telephone Encounter (Signed)
Mtcb//kn

## 2013-08-15 NOTE — Telephone Encounter (Signed)
Message copied by Robley Fries on Thu Aug 15, 2013  1:35 PM ------      Message from: Megan Salon      Created: Thu Aug 15, 2013 12:50 PM       Inform culture is negative.  Just wondering about her symptoms.  They were better when she was here but I just wanted to check on her. ------

## 2013-08-22 NOTE — Telephone Encounter (Signed)
Patient aware of results//kn

## 2013-08-26 ENCOUNTER — Encounter (INDEPENDENT_AMBULATORY_CARE_PROVIDER_SITE_OTHER): Payer: Self-pay

## 2013-08-26 ENCOUNTER — Ambulatory Visit
Admission: RE | Admit: 2013-08-26 | Discharge: 2013-08-26 | Disposition: A | Discharge status: Home / Self Care | Payer: Medicare Other | Source: Ambulatory Visit | Attending: Adult Health | Admitting: Adult Health

## 2013-08-26 DIAGNOSIS — R922 Inconclusive mammogram: Secondary | ICD-10-CM | POA: Diagnosis not present

## 2013-08-26 DIAGNOSIS — Z853 Personal history of malignant neoplasm of breast: Secondary | ICD-10-CM

## 2013-09-10 DIAGNOSIS — H43819 Vitreous degeneration, unspecified eye: Secondary | ICD-10-CM | POA: Diagnosis not present

## 2013-10-01 ENCOUNTER — Ambulatory Visit (INDEPENDENT_AMBULATORY_CARE_PROVIDER_SITE_OTHER): Payer: Medicare Other | Admitting: General Surgery

## 2013-10-01 ENCOUNTER — Encounter (INDEPENDENT_AMBULATORY_CARE_PROVIDER_SITE_OTHER): Payer: Self-pay | Admitting: General Surgery

## 2013-10-01 VITALS — BP 122/72 | HR 67 | Temp 98.0°F | Resp 18 | Ht 70.0 in | Wt 178.0 lb

## 2013-10-01 DIAGNOSIS — C50419 Malignant neoplasm of upper-outer quadrant of unspecified female breast: Secondary | ICD-10-CM

## 2013-10-01 NOTE — Progress Notes (Signed)
Subjective:     Patient ID: Kelly Stuart, female   DOB: 03-17-1946, 68 y.o.   MRN: [id number]  HPI This is a 68 year old female treated for left breast cancer. She was placed on aromatase inhibitor initially as she did not want to undergo surgery. Eventually she underwent a left-sided lumpectomy )she declined a sentinel lymph node biopsy) showed a grade 1-1.1 cm invasive ductal carcinoma that was 100% ER and PR positive with a proliferation index of 8%. She declined radiation therapy and it did not want to continue antiestrogen therapy due to some bone or joint pain. She has reaffirmed those decisions numerous times. She returns today doing well and she has absolutely no complaints. She is very active. 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 She had a mammogram in May of this year that was BI-RADS 2 and will followup in one year. She is doing her own exams.  Review of Systems  Constitutional: Negative for fever, chills and unexpected weight change.  HENT: Negative for congestion, hearing loss, sore throat, trouble swallowing and voice change.   Eyes: Negative for visual disturbance.  Respiratory: Negative for cough and wheezing.   Cardiovascular: Negative for chest pain, palpitations and leg swelling.  Gastrointestinal: Negative for nausea, vomiting, abdominal pain, diarrhea, constipation, blood in stool, abdominal distention and anal bleeding.  Genitourinary: Negative for hematuria, vaginal bleeding and difficulty urinating.  Musculoskeletal: Negative for arthralgias.  Skin: Negative for rash and wound.  Neurological: Negative for seizures, syncope and headaches.  Hematological: Negative for adenopathy. Does not bruise/bleed easily.  Psychiatric/Behavioral: Negative for confusion.   EXAM:  DIGITAL DIAGNOSTIC bilateral MAMMOGRAM WITH CAD  COMPARISON: Previous exams.  ACR Breast Density Category c: The breast tissue is heterogeneously  dense, which may obscure small masses.    FINDINGS:  Examination demonstrates stable post lumpectomy changes of the upper  outer quadrant of the left breast. Remainder of the exam is  unchanged.  Mammographic images were processed with CAD.  IMPRESSION:  Stable post lumpectomy changes of the left breast.  RECOMMENDATION:  Recommend continued annual bilateral diagnostic mammographic  evaluation.     Objective:   Physical Exam  Vitals reviewed. Constitutional: She appears well-developed and well-nourished.  Neck: Neck supple.  Pulmonary/Chest: Right breast exhibits no inverted nipple, no mass, no nipple discharge, no skin change and no tenderness. Left breast exhibits no inverted nipple, no mass, no nipple discharge, no skin change and no tenderness.    Lymphadenopathy:    She has no cervical adenopathy.    She has no axillary adenopathy.       Right: No supraclavicular adenopathy present.       Left: No supraclavicular adenopathy present.       Assessment:     Stage I left breast cancer     Plan:     She has no clinical evidence of recurrence. She is very well aware that she is not pursuing all the recommended therapy. She is going to continue her own exams. She will get her next mammogram next May  I will plan on following her up in one year and she will be seen at the cancer center in the interim.

## 2013-10-29 ENCOUNTER — Ambulatory Visit: Payer: PRIVATE HEALTH INSURANCE | Admitting: Obstetrics & Gynecology

## 2013-11-04 DIAGNOSIS — L820 Inflamed seborrheic keratosis: Secondary | ICD-10-CM | POA: Diagnosis not present

## 2013-12-06 ENCOUNTER — Ambulatory Visit (INDEPENDENT_AMBULATORY_CARE_PROVIDER_SITE_OTHER): Payer: Medicare Other | Admitting: Family Medicine

## 2013-12-06 VITALS — BP 128/80 | HR 84 | Temp 98.2°F | Resp 17 | Ht 69.0 in | Wt 181.0 lb

## 2013-12-06 DIAGNOSIS — R21 Rash and other nonspecific skin eruption: Secondary | ICD-10-CM | POA: Diagnosis not present

## 2013-12-06 NOTE — Progress Notes (Signed)
Chief Complaint:  Chief Complaint  Patient presents with  . Rash    on back of leg     HPI: Kelly Stuart is a 68 y.o. female who is here for  Rash behind her  right leg, initially had some blister and then burst and now flat. No bites that she knows. Denies any new, lotions, sopas, new meds, no itching, burning, no warmth, no pain She has put meomycin on it. She was taking a shower when she noticed it, she did not shave. IT is doing ok without anything except neosporin. She ahs a lot of drug allergies and does nto want to be on any meds if unnecessary  Past Medical History  Diagnosis Date  . Breast cancer     ER+PR+ HER-2NEU negative   Past Surgical History  Procedure Laterality Date  . Tonsillectomy    . Maxillofacial surgery  1980    mouth-ealign teeth-  . Facelift w/blepharoplasty  1999       . [place]  2007  . Breast lumpectomy with needle localization  02/29/2012    Procedure: BREAST LUMPECTOMY WITH NEEDLE LOCALIZATION;  Surgeon: Rolm Bookbinder, MD;  Location: [place] [facility];  Service: General;  Laterality: Left;  . Dilation and curettage of uterus  1998    submucus myoma-resected  . Cosmetic surgery    . Eye surgery    . Breast surgery     History   Social History  . Marital Status: Divorced    Spouse Name: N/A    Number of Children: 1  . Years of Education: N/A   Occupational History  .      Engineer, agricultural  .      Massage Therapist [place] History Main Topics  . Smoking status: Former Smoker -- 2.00 packs/day    Types: Cigarettes    Quit date: 11/01/1991  . Smokeless tobacco: Never Used  . Alcohol Use: 0.0 oz/week     Comment: 2-3 glasses wine daily  . Drug Use: No  . Sexual Activity: Yes   Other Topics Concern  . None   Social History Narrative  . None   Family History  Problem Relation Age of Onset  . Cancer Mother   . Colon cancer Mother   . Cancer Father   . Colon cancer Father   .  Rheumatic fever Sister   . Hepatitis Father   . Hiatal hernia Father    Allergies  Allergen Reactions  . Codeine Nausea And Vomiting  . Tetracyclines & Related     Patient states Acromycin  . Penicillins Rash  . Sulfa Antibiotics Rash   Prior to Admission medications   Medication Sig Start Date End Date Taking? Authorizing Provider  Ascorbic Acid (VITAMIN C) 100 MG tablet Take 200 mg by mouth daily.   Yes Historical Provider, MD  BIOTIN PO Take by mouth daily.   Yes Historical Provider, MD  cholecalciferol (VITAMIN D) 1000 UNITS tablet Take 1,000 Units by mouth daily.   Yes Historical Provider, MD  Estradiol 10 MCG TABS vaginal tablet Place 1 tablet vaginally. Using once monthly 11/16/12  Yes Lyman Speller, MD  Multiple Vitamin (MULTIVITAMIN) tablet Take 1 tablet by mouth daily.   Yes Historical Provider, MD  nitrofurantoin, macrocrystal-monohydrate, (MACROBID) 100 MG capsule Take 1 capsule (100 mg total) by mouth 2 (two) times daily. 08/13/13  Yes Lyman Speller, MD  UNABLE TO FIND Take 1 capsule by mouth  2 (two) times daily. Pueraria Mirfica Supplement   Yes Historical Provider, MD     ROS: The patient denies fevers, chills, night sweats, unintentional weight loss, chest pain, palpitations, wheezing, dyspnea on exertion, nausea, vomiting, abdominal pain, dysuria, hematuria, melena, numbness, weakness, or tingling.   All other systems have been reviewed and were otherwise negative with the exception of those mentioned in the HPI and as above.    PHYSICAL EXAM: Filed Vitals:   12/06/13 1739  BP: 128/80  Pulse: 84  Temp: 98.2 F (36.8 C)  Resp: 17   Filed Vitals:   12/06/13 1739  Height: 5\' 9"  (1.753 m)  Weight: 181 lb (82.101 kg)   Body mass index is 26.72 kg/(m^2).  General: Alert, no acute distress HEENT:  Normocephalic, atraumatic, oropharynx patent. EOMI, PERRLA Cardiovascular:  Regular rate and rhythm, no rubs murmurs or gallops.  No Carotid bruits, radial  pulse intact. No pedal edema.  Respiratory: Clear to auscultation bilaterally.  No wheezes, rales, or rhonchi.  No cyanosis, no use of accessory musculature GI: No organomegaly, abdomen is soft and non-tender, positive bowel sounds.  No masses. Skin: + falt bilsters have pooped, noninfectious appearing macular rash, no dc, dry Neurologic: Facial musculature symmetric. Psychiatric: Patient is appropriate throughout our interaction. Lymphatic: No cervical lymphadenopathy Musculoskeletal: Gait intact.   LABS: Results for orders placed in visit on 08/13/13  URINE CULTURE      Result Value Ref Range   Colony Count NO GROWTH     Organism ID, Bacteria NO GROWTH    POCT URINALYSIS DIPSTICK      Result Value Ref Range   Color, UA       Clarity, UA       Glucose, UA n     Bilirubin, UA n     Ketones, UA n     Spec Grav, UA       Blood, UA n     pH, UA 5.0     Protein, UA n     Urobilinogen, UA negative     Nitrite, UA n     Leukocytes, UA Negative       EKG/XRAY:   Primary read interpreted by Dr. Marin Comment at [facility].   ASSESSMENT/PLAN: Encounter Diagnosis  Name Primary?  . Rash and nonspecific skin eruption Yes   ? Contact dermatitis rash with blisters that now have popped, does not appera to be shingles since no pain and doe snot appear to be tick borne in etiology Will contnue with otc neosporin and also good wound care with soap and water and monitor IF she needs orl abx then I will rx it to her, she will just need to call me Wound precautions given  Fu prn  Gross sideeffects, risk and benefits, and alternatives of medications d/w patient. Patient is aware that all medications have potential sideeffects and we are unable to predict every sideeffect or drug-drug interaction that may occur.  Pastor Sgro, [place], DO 12/06/2013 6:15 PM

## 2013-12-10 ENCOUNTER — Ambulatory Visit
Admission: RE | Admit: 2013-12-10 | Discharge: 2013-12-10 | Disposition: A | Discharge status: Home / Self Care | Payer: Medicare Other | Source: Ambulatory Visit | Attending: Chiropractic Medicine | Admitting: Chiropractic Medicine

## 2013-12-10 ENCOUNTER — Other Ambulatory Visit: Payer: Self-pay | Admitting: Unknown Physician Specialty

## 2013-12-10 DIAGNOSIS — M47817 Spondylosis without myelopathy or radiculopathy, lumbosacral region: Secondary | ICD-10-CM | POA: Diagnosis not present

## 2013-12-10 DIAGNOSIS — M5432 Sciatica, left side: Secondary | ICD-10-CM

## 2013-12-10 DIAGNOSIS — M4310 Spondylolisthesis, site unspecified: Secondary | ICD-10-CM | POA: Diagnosis not present

## 2013-12-31 ENCOUNTER — Encounter: Payer: Self-pay | Admitting: Gynecology

## 2013-12-31 ENCOUNTER — Telehealth: Payer: Self-pay | Admitting: Gynecology

## 2013-12-31 NOTE — Telephone Encounter (Signed)
Called patient and left a message cancelling her appointment for 01/06/14 for AEX with Dr. Charlies Constable. Also sent a letter with same information and for patient to call back to reschedule.

## 2014-01-06 ENCOUNTER — Ambulatory Visit: Payer: PRIVATE HEALTH INSURANCE | Admitting: Gynecology

## 2014-01-16 ENCOUNTER — Telehealth: Payer: Self-pay | Admitting: Hematology and Oncology

## 2014-01-16 NOTE — Telephone Encounter (Signed)
pt called and r/s 9/25 lb/LC to 10/12. pt has new d/t.

## 2014-01-17 ENCOUNTER — Encounter: Payer: Medicare Other | Admitting: Adult Health

## 2014-01-17 ENCOUNTER — Other Ambulatory Visit: Payer: Medicare Other

## 2014-01-20 NOTE — Progress Notes (Signed)
This encounter was created in error - please disregard.

## 2014-02-03 ENCOUNTER — Other Ambulatory Visit (HOSPITAL_BASED_OUTPATIENT_CLINIC_OR_DEPARTMENT_OTHER): Payer: Medicare Other

## 2014-02-03 ENCOUNTER — Telehealth: Payer: Self-pay | Admitting: Adult Health

## 2014-02-03 ENCOUNTER — Ambulatory Visit (HOSPITAL_BASED_OUTPATIENT_CLINIC_OR_DEPARTMENT_OTHER): Payer: Medicare Other | Admitting: Adult Health

## 2014-02-03 ENCOUNTER — Encounter: Payer: Self-pay | Admitting: Adult Health

## 2014-02-03 VITALS — BP 143/74 | HR 75 | Temp 98.3°F | Resp 18 | Ht 69.0 in | Wt 178.9 lb

## 2014-02-03 DIAGNOSIS — Z853 Personal history of malignant neoplasm of breast: Secondary | ICD-10-CM

## 2014-02-03 DIAGNOSIS — C50412 Malignant neoplasm of upper-outer quadrant of left female breast: Secondary | ICD-10-CM

## 2014-02-03 LAB — CBC WITH DIFFERENTIAL/PLATELET
BASO%: 0.3 % (ref 0.0–2.0)
Basophils Absolute: 0 10*3/uL (ref 0.0–0.1)
EOS%: 1.4 % (ref 0.0–7.0)
Eosinophils Absolute: 0.1 10*3/uL (ref 0.0–0.5)
HEMATOCRIT: 38.4 % (ref 34.8–46.6)
HEMOGLOBIN: 12.6 g/dL (ref 11.6–15.9)
LYMPH#: 2.3 10*3/uL (ref 0.9–3.3)
LYMPH%: 38.4 % (ref 14.0–49.7)
MCH: 32.1 pg (ref 25.1–34.0)
MCHC: 32.9 g/dL (ref 31.5–36.0)
MCV: 97.8 fL (ref 79.5–101.0)
MONO#: 0.4 10*3/uL (ref 0.1–0.9)
MONO%: 6.1 % (ref 0.0–14.0)
NEUT#: 3.2 10*3/uL (ref 1.5–6.5)
NEUT%: 53.8 % (ref 38.4–76.8)
Platelets: 265 10*3/uL (ref 145–400)
RBC: 3.92 10*6/uL (ref 3.70–5.45)
RDW: 13 % (ref 11.2–14.5)
WBC: 5.9 10*3/uL (ref 3.9–10.3)

## 2014-02-03 LAB — COMPREHENSIVE METABOLIC PANEL (CC13)
ALK PHOS: 56 U/L (ref 40–150)
ALT: 17 U/L (ref 0–55)
AST: 17 U/L (ref 5–34)
Albumin: 3.8 g/dL (ref 3.5–5.0)
Anion Gap: 9 mEq/L (ref 3–11)
BILIRUBIN TOTAL: 0.59 mg/dL (ref 0.20–1.20)
BUN: 13.1 mg/dL (ref 7.0–26.0)
CO2: 25 mEq/L (ref 22–29)
CREATININE: 0.8 mg/dL (ref 0.6–1.1)
Calcium: 9.6 mg/dL (ref 8.4–10.4)
Chloride: 107 mEq/L (ref 98–109)
Glucose: 121 mg/dl (ref 70–140)
Potassium: 4.2 mEq/L (ref 3.5–5.1)
Sodium: 141 mEq/L (ref 136–145)
Total Protein: 7.4 g/dL (ref 6.4–8.3)

## 2014-02-03 NOTE — Telephone Encounter (Signed)
per pof to sch pt appt-pt has copy of sch

## 2014-02-03 NOTE — Progress Notes (Signed)
Hematology and Oncology Follow Up Visit  Kelly Stuart [id number] 09/22/1945 68 y.o. 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     Prior Therapy:    1. S/P core needle biopsy with the pathology showing a grade 1 ER+ IDC (T2N0) of the lest breast   2. Begun on neoadjuvant arimidex 1 mg daily since 05/18/11 - 02/2012   3. S/p Lumpectomy on 02/29/12 without a sentinel node procedure (patient declined). The final pathology showed a 1.1 cm well differentiated invasive ductal carcinoma ER+ Her2 negative   4. She has seen Dr. Sondra Come for consideration of post lumpectomy radiation but patient declined.  she has also declined any kind of antiestrogen therapy.   Current therapy: observation  Interim History:  Patient is a 68 year old female with left breast invasive ductal carcinoma who is here for follow up and evaluation.  She has declined anti-estrogen therapy.  She is doing well today, and denies fevers, chills, new pain, headaches, weakness, numbness, vision changes, bowel/bladder changes, or any further concerns.   Medications:  Current Outpatient Prescriptions  Medication Sig Dispense Refill  . Ascorbic Acid (VITAMIN C) 100 MG tablet Take 200 mg by mouth daily.      Marland Kitchen BIOTIN PO Take by mouth daily.      . cholecalciferol (VITAMIN D) 1000 UNITS tablet Take 1,000 Units by mouth daily.      . Estradiol 10 MCG TABS vaginal tablet Place 1 tablet vaginally. Using once monthly      . Multiple Vitamin (MULTIVITAMIN) tablet Take 1 tablet by mouth daily.      Marland Kitchen UNABLE TO FIND Take 1 capsule by mouth 2 (two) times daily. Pueraria Mirfica Supplement       No current facility-administered medications for this visit.     Allergies:  Allergies  Allergen Reactions  . Codeine Nausea And Vomiting  . Tetracyclines & Related     Patient states Acromycin   . Penicillins Rash  . Sulfa Antibiotics Rash    Past Medical History, Surgical history, Social history, and Family History were reviewed and updated.  Review of Systems: A 10 point review of systems was conducted and is otherwise negative except for what is noted above.    Health Maintenance  Mammogram: 08/26/2013 Colonoscopy: 2013 with 5 year f/u recommended Bone Density Scan: 09/10/2010, normal Pap Smear: 09/27/2012 Eye Exam: 2014 Vitamin D Level: PCP Dr. Sabra Heck follows Lipid Panel: PCP Dr. Sabra Heck follows, unsure when done last  Physical Exam: Blood pressure 143/74, pulse 75, temperature 98.3 F (36.8 C), temperature source Oral, resp. rate 18, height _0  (1.753 m), weight 178 lb 14.4 oz (81.149 kg), last menstrual period 08/23/2008, SpO2 98.00%. GENERAL: Patient is a well appearing female in no acute distress HEENT:  Sclerae anicteric.  Oropharynx clear and moist. No ulcerations or evidence of oropharyngeal candidiasis. Neck is supple.  NODES:  No cervical, supraclavicular, or axillary lymphadenopathy palpated.  BREAST EXAM: left breast lumpectomy site without nodularity or mass, right breast no masses, nodules or lesions, no skin changes, benign breast exam.   LUNGS:  Clear to auscultation bilaterally.  No wheezes or rhonchi. HEART:  Regular rate and rhythm. No murmur appreciated. ABDOMEN:  Soft, nontender.  Positive, normoactive bowel sounds. No organomegaly palpated. MSK:  No focal spinal tenderness to palpation. Full range of motion bilaterally in the upper extremities.  EXTREMITIES:  No peripheral edema.   SKIN:  Clear with no obvious rashes or skin changes. No nail dyscrasia. NEURO:  Nonfocal. Well oriented.  Appropriate affect. ECOG: 0      Lab Results: Lab Results  Component Value Date   WBC 5.2 07/16/2013   HGB 12.8 07/16/2013   HCT 38.6 07/16/2013   MCV 99.2 07/16/2013   PLT 233 07/16/2013     Chemistry      Component Value Date/Time   NA 142 07/16/2013 0934    NA 137 02/28/2012 1030   K 4.5 07/16/2013 0934   K 4.3 02/28/2012 1030   CL 106 06/12/2012 1411   CL 100 02/28/2012 1030   CO2 24 07/16/2013 0934   CO2 28 02/28/2012 1030   BUN 12.8 07/16/2013 0934   BUN 12 02/28/2012 1030   CREATININE 0.8 07/16/2013 0934   CREATININE 0.70 02/28/2012 1030      Component Value Date/Time   CALCIUM 9.4 07/16/2013 0934   CALCIUM 9.7 02/28/2012 1030   ALKPHOS 63 07/16/2013 0934   ALKPHOS 52 09/05/2011 0903   AST 16 07/16/2013 0934   AST 21 09/05/2011 0903   ALT 17 07/16/2013 0934   ALT 14 09/05/2011 0903   BILITOT 0.60 07/16/2013 0934   BILITOT 0.7 09/05/2011 0903       Assessment and Plan:   Patient is a 68 year old female with  1.  H/o invasive ductal carcinoma of the left breast.  She has underwent neoadjuvant anti-estrogen therapy, followed by lumpectomy.  She declined adjuvant radiation and adjuvant anti-estrogen therapy.  She continues to decline anti-estrogen therapy.  She has no sign of recurrence.  I recommended healthy diet, exercise, and monthly breast exams.  Her labs were pending at the time of completion.  She does take Estradiol vaginally every month.  I did counsel her on the risk regarding this, and the data about it.    She will return to the office in 6 months for follow up.  She knows to call us in the interim if she has any questions or concerns and we will see her sooner if needed.    I spent 15 minutes counseling the patient face to face.  The total time spent in the appointment was 30 minutes.  Minette Headland, [place] [phone]  10/12/20151:07 PM

## 2014-02-03 NOTE — Patient Instructions (Signed)
You are doing well.  You have no sign of recurrence.  I recommend healthy diet, exercise, and monthly breast exams.    Breast Self-Awareness Practicing breast self-awareness may pick up problems early, prevent significant medical complications, and possibly save your life. By practicing breast self-awareness, you can become familiar with how your breasts look and feel and if your breasts are changing. This allows you to notice changes early. It can also offer you some reassurance that your breast health is good. One way to learn what is normal for your breasts and whether your breasts are changing is to do a breast self-exam. If you find a lump or something that was not present in the past, it is best to contact your caregiver right away. Other findings that should be evaluated by your caregiver include nipple discharge, especially if it is bloody; skin changes or reddening; areas where the skin seems to be pulled in (retracted); or new lumps and bumps. Breast pain is seldom associated with cancer (malignancy), but should also be evaluated by a caregiver. HOW TO PERFORM A BREAST SELF-EXAM The best time to examine your breasts is 5-7 days after your menstrual period is over. During menstruation, the breasts are lumpier, and it may be more difficult to pick up changes. If you do not menstruate, have reached menopause, or had your uterus removed (hysterectomy), you should examine your breasts at regular intervals, such as monthly. If you are breastfeeding, examine your breasts after a feeding or after using a breast pump. Breast implants do not decrease the risk for lumps or tumors, so continue to perform breast self-exams as recommended. Talk to your caregiver about how to determine the difference between the implant and breast tissue. Also, talk about the amount of pressure you should use during the exam. Over time, you will become more familiar with the variations of your breasts and more comfortable with the  exam. A breast self-exam requires you to remove all your clothes above the waist. 1. Look at your breasts and nipples. Stand in front of a mirror in a room with good lighting. With your hands on your hips, push your hands firmly downward. Look for a difference in shape, contour, and size from one breast to the other (asymmetry). Asymmetry includes puckers, dips, or bumps. Also, look for skin changes, such as reddened or scaly areas on the breasts. Look for nipple changes, such as discharge, dimpling, repositioning, or redness. 2. Carefully feel your breasts. This is best done either in the shower or tub while using soapy water or when flat on your back. Place the arm (on the side of the breast you are examining) above your head. Use the pads (not the fingertips) of your three middle fingers on your opposite hand to feel your breasts. Start in the underarm area and use  inch (2 cm) overlapping circles to feel your breast. Use 3 different levels of pressure (light, medium, and firm pressure) at each circle before moving to the next circle. The light pressure is needed to feel the tissue closest to the skin. The medium pressure will help to feel breast tissue a little deeper, while the firm pressure is needed to feel the tissue close to the ribs. Continue the overlapping circles, moving downward over the breast until you feel your ribs below your breast. Then, move one finger-width towards the center of the body. Continue to use the  inch (2 cm) overlapping circles to feel your breast as you move slowly up toward  the collar bone (clavicle) near the base of the neck. Continue the up and down exam using all 3 pressures until you reach the middle of the chest. Do this with each breast, carefully feeling for lumps or changes. 3.  Keep a written record with breast changes or normal findings for each breast. By writing this information down, you do not need to depend only on memory for size, tenderness, or location.  Write down where you are in your menstrual cycle, if you are still menstruating. Breast tissue can have some lumps or thick tissue. However, see your caregiver if you find anything that concerns you.  SEEK MEDICAL CARE IF:  You see a change in shape, contour, or size of your breasts or nipples.   You see skin changes, such as reddened or scaly areas on the breasts or nipples.   You have an unusual discharge from your nipples.   You feel a new lump or unusually thick areas.  Document Released: 04/11/2005 Document Revised: 03/28/2012 Document Reviewed: 07/27/2011 [facility] Patient Information 2015 [place], [place]. This information is not intended to replace advice given to you by your health care provider. Make sure you discuss any questions you have with your health care provider.

## 2014-02-24 ENCOUNTER — Encounter: Payer: Self-pay | Admitting: Adult Health

## 2014-02-25 DIAGNOSIS — L259 Unspecified contact dermatitis, unspecified cause: Secondary | ICD-10-CM | POA: Diagnosis not present

## 2014-02-25 DIAGNOSIS — B029 Zoster without complications: Secondary | ICD-10-CM | POA: Diagnosis not present

## 2014-02-27 DIAGNOSIS — Z049 Encounter for examination and observation for unspecified reason: Secondary | ICD-10-CM | POA: Diagnosis not present

## 2014-02-27 DIAGNOSIS — H00019 Hordeolum externum unspecified eye, unspecified eyelid: Secondary | ICD-10-CM | POA: Diagnosis not present

## 2014-04-21 DIAGNOSIS — L820 Inflamed seborrheic keratosis: Secondary | ICD-10-CM | POA: Diagnosis not present

## 2014-06-02 DIAGNOSIS — M545 Low back pain: Secondary | ICD-10-CM | POA: Diagnosis not present

## 2014-06-10 DIAGNOSIS — M6281 Muscle weakness (generalized): Secondary | ICD-10-CM | POA: Diagnosis not present

## 2014-06-10 DIAGNOSIS — M545 Low back pain: Secondary | ICD-10-CM | POA: Diagnosis not present

## 2014-06-10 DIAGNOSIS — M79605 Pain in left leg: Secondary | ICD-10-CM | POA: Diagnosis not present

## 2014-06-10 DIAGNOSIS — M25552 Pain in left hip: Secondary | ICD-10-CM | POA: Diagnosis not present

## 2014-06-12 DIAGNOSIS — M25552 Pain in left hip: Secondary | ICD-10-CM | POA: Diagnosis not present

## 2014-06-12 DIAGNOSIS — M545 Low back pain: Secondary | ICD-10-CM | POA: Diagnosis not present

## 2014-06-12 DIAGNOSIS — M6281 Muscle weakness (generalized): Secondary | ICD-10-CM | POA: Diagnosis not present

## 2014-06-12 DIAGNOSIS — M79605 Pain in left leg: Secondary | ICD-10-CM | POA: Diagnosis not present

## 2014-06-17 DIAGNOSIS — M25552 Pain in left hip: Secondary | ICD-10-CM | POA: Diagnosis not present

## 2014-06-17 DIAGNOSIS — M6281 Muscle weakness (generalized): Secondary | ICD-10-CM | POA: Diagnosis not present

## 2014-06-17 DIAGNOSIS — M79605 Pain in left leg: Secondary | ICD-10-CM | POA: Diagnosis not present

## 2014-06-17 DIAGNOSIS — M545 Low back pain: Secondary | ICD-10-CM | POA: Diagnosis not present

## 2014-06-19 DIAGNOSIS — M545 Low back pain: Secondary | ICD-10-CM | POA: Diagnosis not present

## 2014-06-19 DIAGNOSIS — M6281 Muscle weakness (generalized): Secondary | ICD-10-CM | POA: Diagnosis not present

## 2014-06-19 DIAGNOSIS — M25552 Pain in left hip: Secondary | ICD-10-CM | POA: Diagnosis not present

## 2014-06-19 DIAGNOSIS — M79605 Pain in left leg: Secondary | ICD-10-CM | POA: Diagnosis not present

## 2014-06-24 DIAGNOSIS — M79605 Pain in left leg: Secondary | ICD-10-CM | POA: Diagnosis not present

## 2014-06-24 DIAGNOSIS — M6281 Muscle weakness (generalized): Secondary | ICD-10-CM | POA: Diagnosis not present

## 2014-06-24 DIAGNOSIS — M545 Low back pain: Secondary | ICD-10-CM | POA: Diagnosis not present

## 2014-06-24 DIAGNOSIS — M25552 Pain in left hip: Secondary | ICD-10-CM | POA: Diagnosis not present

## 2014-06-26 DIAGNOSIS — M79605 Pain in left leg: Secondary | ICD-10-CM | POA: Diagnosis not present

## 2014-06-26 DIAGNOSIS — M6281 Muscle weakness (generalized): Secondary | ICD-10-CM | POA: Diagnosis not present

## 2014-06-26 DIAGNOSIS — M545 Low back pain: Secondary | ICD-10-CM | POA: Diagnosis not present

## 2014-06-26 DIAGNOSIS — M25552 Pain in left hip: Secondary | ICD-10-CM | POA: Diagnosis not present

## 2014-06-30 DIAGNOSIS — M545 Low back pain: Secondary | ICD-10-CM | POA: Diagnosis not present

## 2014-07-07 DIAGNOSIS — M545 Low back pain: Secondary | ICD-10-CM | POA: Diagnosis not present

## 2014-07-07 DIAGNOSIS — M79605 Pain in left leg: Secondary | ICD-10-CM | POA: Diagnosis not present

## 2014-07-07 DIAGNOSIS — M6281 Muscle weakness (generalized): Secondary | ICD-10-CM | POA: Diagnosis not present

## 2014-07-07 DIAGNOSIS — M25552 Pain in left hip: Secondary | ICD-10-CM | POA: Diagnosis not present

## 2014-07-07 DIAGNOSIS — M542 Cervicalgia: Secondary | ICD-10-CM | POA: Diagnosis not present

## 2014-07-08 DIAGNOSIS — M79605 Pain in left leg: Secondary | ICD-10-CM | POA: Diagnosis not present

## 2014-07-08 DIAGNOSIS — M25552 Pain in left hip: Secondary | ICD-10-CM | POA: Diagnosis not present

## 2014-07-08 DIAGNOSIS — M6281 Muscle weakness (generalized): Secondary | ICD-10-CM | POA: Diagnosis not present

## 2014-07-08 DIAGNOSIS — M545 Low back pain: Secondary | ICD-10-CM | POA: Diagnosis not present

## 2014-07-08 DIAGNOSIS — M542 Cervicalgia: Secondary | ICD-10-CM | POA: Diagnosis not present

## 2014-08-11 ENCOUNTER — Telehealth: Payer: Self-pay | Admitting: Obstetrics & Gynecology

## 2014-08-11 ENCOUNTER — Ambulatory Visit (HOSPITAL_BASED_OUTPATIENT_CLINIC_OR_DEPARTMENT_OTHER): Payer: Medicare Other | Admitting: Hematology and Oncology

## 2014-08-11 ENCOUNTER — Ambulatory Visit: Payer: Medicare Other | Admitting: Adult Health

## 2014-08-11 ENCOUNTER — Telehealth: Payer: Self-pay | Admitting: Hematology and Oncology

## 2014-08-11 VITALS — BP 138/76 | HR 68 | Temp 97.9°F | Resp 18 | Ht 69.0 in | Wt 186.7 lb

## 2014-08-11 DIAGNOSIS — Z853 Personal history of malignant neoplasm of breast: Secondary | ICD-10-CM

## 2014-08-11 DIAGNOSIS — C50412 Malignant neoplasm of upper-outer quadrant of left female breast: Secondary | ICD-10-CM

## 2014-08-11 MED ORDER — ESTRADIOL 10 MCG VA TABS: 1.0000 | ORAL_TABLET | VAGINAL | Status: DC

## 2014-08-11 NOTE — Telephone Encounter (Deleted)
Yvone Neu from [facility] called stating patient's Estradiol and Vagifem require prior authorization. He is faxing the forms over.

## 2014-08-11 NOTE — Telephone Encounter (Signed)
Spoke with patient and she is aware of her follow up appointment

## 2014-08-11 NOTE — Assessment & Plan Note (Signed)
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 She declined radiation and adjuvant antiestrogen therapies.  Breast cancer surveillance: 1. Breast exam 08/11/2014 is normal except for postsurgical changes 2. Mammogram 08/26/2013 was normal  Patient uses vaginal estradiol.  Survivorship:Discussed the importance of physical exercise in decreasing the likelihood of breast cancer recurrence. Recommended 30 mins daily 6 days a week of either brisk walking or cycling or swimming. Encouraged patient to eat more fruits and vegetables and decrease red meat.   Return to clinic in 1 year for follow-up

## 2014-08-11 NOTE — Progress Notes (Signed)
Patient Care Team: Megan Salon, MD as PCP - General (Obstetrics and Gynecology)  DIAGNOSIS: Primary cancer of upper outer quadrant of left female breast   Staging form: Breast, AJCC 7th Edition     Clinical: Stage IIA (Free text: T2 N0 M0) - Unsigned       Staging comments: Clinically Staged in Breast Conference 05/11/11      Pathologic: No stage assigned - Unsigned   SUMMARY OF ONCOLOGIC HISTORY:   Primary cancer of upper outer quadrant of left female breast   05/03/2011 Initial Diagnosis Left breast invasive ductal carcinoma T2 N0 M0 stage II a clinical stage, grade 1 ER/PR positive HER-2 negative   05/18/2011 - 03/12/2012 Anti-estrogen oral therapy Neoadjuvant Arimidex 1 mg daily   02/29/2012 Surgery Left breast lumpectomy without sentinel lymph node biopsy (patient declined), 1.1 cm well-differentiated IDC ER/PR positive HER-2 negative T1 cN0 M0 stage IA    Radiation Therapy Declined radiation therapy, also declined antiestrogen therapy    CHIEF COMPLIANT: Follow-up of breast cancer  INTERVAL HISTORY: Kelly Stuart is a 69 year old lady with above-mentioned history left breast cancer with surgery and declined radiation or antiestrogen therapy. She did take neoadjuvant antiestrogen therapy for 9 months. She did not want to take antiestrogen therapy because of musculoskeletal aches and pains. She is doing very well without any problems or concerns. She does participate in yoga and exercises. She continues to have some arthritic symptoms in her hips and knees.  REVIEW OF SYSTEMS:   Constitutional: Denies fevers, chills or abnormal weight loss Eyes: Denies blurriness of vision Ears, nose, mouth, throat, and face: Denies mucositis or sore throat Respiratory: Denies cough, dyspnea or wheezes Cardiovascular: Denies palpitation, chest discomfort or lower extremity swelling Gastrointestinal:  Denies nausea, heartburn or change in bowel habits Skin: Denies abnormal skin rashes Lymphatics:  Denies new lymphadenopathy or easy bruising Neurological:Denies numbness, tingling or new weaknesses Behavioral/Psych: Mood is stable, no new changes  Breast:  denies any pain or lumps or nodules in either breasts All other systems were reviewed with the patient and are negative.  I have reviewed the past medical history, past surgical history, social history and family history with the patient and they are unchanged from previous note.  ALLERGIES:  is allergic to codeine; tetracyclines & related; penicillins; and sulfa antibiotics.  MEDICATIONS:  Current Outpatient Prescriptions  Medication Sig Dispense Refill  . Ascorbic Acid (VITAMIN C) 100 MG tablet Take 200 mg by mouth daily.    Marland Kitchen BIOTIN PO Take by mouth daily.    . cholecalciferol (VITAMIN D) 1000 UNITS tablet Take 1,000 Units by mouth daily.    . Estradiol 10 MCG TABS vaginal tablet Place 1 tablet (10 mcg total) vaginally as directed. Twice a month 6 tablet 1  . Multiple Vitamin (MULTIVITAMIN) tablet Take 1 tablet by mouth daily.    Marland Kitchen UNABLE TO FIND Take 1 capsule by mouth 2 (two) times daily. Pueraria Mirfica Supplement     No current facility-administered medications for this visit.    PHYSICAL EXAMINATION: ECOG PERFORMANCE STATUS: 1 - Symptomatic but completely ambulatory  Filed Vitals:   08/11/14 1016  BP: 138/76  Pulse: 68  Temp: 97.9 F (36.6 C)  Resp: 18   Filed Weights   08/11/14 1016  Weight: 186 lb 11.2 oz (84.687 kg)    GENERAL:alert, no distress and comfortable SKIN: skin color, texture, turgor are normal, no rashes or significant lesions EYES: normal, Conjunctiva are pink and non-injected, sclera clear OROPHARYNX:no exudate, no  erythema and lips, buccal mucosa, and tongue normal  NECK: supple, thyroid normal size, non-tender, without nodularity LYMPH:  no palpable lymphadenopathy in the cervical, axillary or inguinal LUNGS: clear to auscultation and percussion with normal breathing effort HEART:  regular rate & rhythm and no murmurs and no lower extremity edema ABDOMEN:abdomen soft, non-tender and normal bowel sounds Musculoskeletal:no cyanosis of digits and no clubbing  NEURO: alert & oriented x 3 with fluent speech, no focal motor/sensory deficits BREAST: No palpable masses or nodules in either right or left breasts. No palpable axillary supraclavicular or infraclavicular adenopathy no breast tenderness or nipple discharge. (exam performed in the presence of a chaperone)  LABORATORY DATA:  I have reviewed the data as listed   Chemistry      Component Value Date/Time   NA 141 02/03/2014 1259   NA 137 02/28/2012 1030   K 4.2 02/03/2014 1259   K 4.3 02/28/2012 1030   CL 106 06/12/2012 1411   CL 100 02/28/2012 1030   CO2 25 02/03/2014 1259   CO2 28 02/28/2012 1030   BUN 13.1 02/03/2014 1259   BUN 12 02/28/2012 1030   CREATININE 0.8 02/03/2014 1259   CREATININE 0.70 02/28/2012 1030      Component Value Date/Time   CALCIUM 9.6 02/03/2014 1259   CALCIUM 9.7 02/28/2012 1030   ALKPHOS 56 02/03/2014 1259   ALKPHOS 52 09/05/2011 0903   AST 17 02/03/2014 1259   AST 21 09/05/2011 0903   ALT 17 02/03/2014 1259   ALT 14 09/05/2011 0903   BILITOT 0.59 02/03/2014 1259   BILITOT 0.7 09/05/2011 0903       Lab Results  Component Value Date   WBC 5.9 02/03/2014   HGB 12.6 02/03/2014   HCT 38.4 02/03/2014   MCV 97.8 02/03/2014   PLT 265 02/03/2014   NEUTROABS 3.2 02/03/2014    ASSESSMENT & PLAN:  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 She declined radiation and adjuvant antiestrogen therapies.  Breast cancer surveillance: 1. Breast exam 08/11/2014 is normal except for postsurgical changes 2. Mammogram 08/26/2013 was normal  Patient uses vaginal estradiol.  Survivorship:Discussed the importance of physical  exercise in decreasing the likelihood of breast cancer recurrence. Recommended 30 mins daily 6 days a week of either brisk walking or cycling or swimming. Encouraged patient to eat more fruits and vegetables and decrease red meat.  Patient participates in [place] hot yoga  She also gets some herbal medication from Dr.Moo and she does not know what is contained in these.   Return to clinic in 1 year for follow-up   No orders of the defined types were placed in this encounter.   The patient has a good understanding of the overall plan. she agrees with it. She will call with any problems that may develop before her next visit here.   Rulon Eisenmenger, MD

## 2014-08-15 ENCOUNTER — Telehealth: Payer: Self-pay

## 2014-08-15 NOTE — Telephone Encounter (Signed)
Left message to call Kelly Stuart at [phone].  Need to discuss requests for PA of Estradiol po tablets and Vagifem tablets. No record per electronic record she is on Estradiol po nor would Dr.Miller recommend. Is she currently paying cash pay for Vagifem? Vagifem is being prescribed by Dr.Gudena.

## 2014-08-19 NOTE — Telephone Encounter (Signed)
Spoke with patient. Patient states that she is not on any oral Estradiol and have not been prescribed this by any other provider. States that Dr.Gudena did write rx for Estradiol vaginal tablets and she will have him fill out PA. Patient would also like to schedule aex at this time with Dr.Miller. Appointment scheduled for 5/19 at 8:30am. Patient is agreeable to date and time.  Routing to provider for final review. Patient agreeable to disposition. Will close encounter

## 2014-08-22 ENCOUNTER — Telehealth: Payer: Self-pay | Admitting: *Deleted

## 2014-08-22 NOTE — Telephone Encounter (Signed)
TC from patient regarding need for prior authorization for Estradiol 10 mcg vaginal tablets. Transferred call to Raquel in [place]

## 2014-08-25 ENCOUNTER — Encounter: Payer: Self-pay | Admitting: Hematology and Oncology

## 2014-08-25 NOTE — Progress Notes (Signed)
I spoke with patient and said prior auth form as faxed to terri [phone]. I called and left a message for terri to get that form to me to get prior auth for estradiol. She said she has [employer].

## 2014-08-27 ENCOUNTER — Telehealth: Payer: Self-pay

## 2014-08-27 NOTE — Telephone Encounter (Signed)
Please PA form in Raquel's inbox 08/27/14.

## 2014-08-27 NOTE — Telephone Encounter (Signed)
-----   Message from Mariam Dollar sent at 08/25/2014  4:28 PM EDT ----- Regarding: Prior Auth form Elvia Collum, Did you get prior auth form for this patient for Estradiol from [facility]? She said they faxed to [phone]. I left a you a voicemail also. Just get to me and I will get prior auth. Thanks, Raquel

## 2014-08-29 ENCOUNTER — Encounter: Payer: Self-pay | Admitting: Hematology and Oncology

## 2014-08-29 NOTE — Progress Notes (Signed)
I placed forms for [employer](estradiol)on desk of nurse for dr Lindi Adie.

## 2014-09-02 ENCOUNTER — Other Ambulatory Visit: Payer: Self-pay | Admitting: Hematology and Oncology

## 2014-09-02 ENCOUNTER — Encounter: Payer: Self-pay | Admitting: Hematology and Oncology

## 2014-09-02 DIAGNOSIS — Z853 Personal history of malignant neoplasm of breast: Secondary | ICD-10-CM

## 2014-09-02 NOTE — Progress Notes (Signed)
I faxed [employer] [phone] drug coverage form

## 2014-09-04 ENCOUNTER — Encounter: Payer: Self-pay | Admitting: Hematology and Oncology

## 2014-09-04 NOTE — Progress Notes (Signed)
I refaxed to [employer]. The pharmacy said a Caryl Pina here to them to withdraw? I sent back to them- see prev notes.

## 2014-09-05 ENCOUNTER — Encounter: Payer: Self-pay | Admitting: Hematology and Oncology

## 2014-09-05 NOTE — Progress Notes (Signed)
I placed form on desk of nurse for dr Lindi Adie. They need names of previous tried and failed prior to vagifem vaginal tab request.

## 2014-09-08 ENCOUNTER — Ambulatory Visit
Admission: RE | Admit: 2014-09-08 | Discharge: 2014-09-08 | Disposition: A | Discharge status: Home / Self Care | Payer: Medicare Other | Source: Ambulatory Visit | Attending: Hematology and Oncology | Admitting: Hematology and Oncology

## 2014-09-08 DIAGNOSIS — R922 Inconclusive mammogram: Secondary | ICD-10-CM | POA: Diagnosis not present

## 2014-09-08 DIAGNOSIS — Z853 Personal history of malignant neoplasm of breast: Secondary | ICD-10-CM

## 2014-09-08 NOTE — Progress Notes (Signed)
Formulary exception LMN Form completed and returned to managed care.

## 2014-09-09 ENCOUNTER — Encounter: Payer: Self-pay | Admitting: Hematology and Oncology

## 2014-09-09 NOTE — Progress Notes (Signed)
I sent staff message to dr. g and nurse to see what meds were tried and failed prior to scrip for vaginal tabs. Her insurance needs for approval

## 2014-09-10 ENCOUNTER — Telehealth: Payer: Self-pay

## 2014-09-10 NOTE — Telephone Encounter (Signed)
Returned [employer] for Info to [employer].

## 2014-09-11 ENCOUNTER — Telehealth: Payer: Self-pay | Admitting: *Deleted

## 2014-09-11 ENCOUNTER — Encounter: Payer: Self-pay | Admitting: Hematology and Oncology

## 2014-09-11 ENCOUNTER — Ambulatory Visit (INDEPENDENT_AMBULATORY_CARE_PROVIDER_SITE_OTHER): Payer: Medicare Other | Admitting: Obstetrics & Gynecology

## 2014-09-11 ENCOUNTER — Encounter: Payer: Self-pay | Admitting: Obstetrics & Gynecology

## 2014-09-11 VITALS — BP 134/82 | HR 60 | Resp 16 | Ht 69.25 in | Wt 186.8 lb

## 2014-09-11 DIAGNOSIS — R5381 Other malaise: Secondary | ICD-10-CM | POA: Diagnosis not present

## 2014-09-11 DIAGNOSIS — E559 Vitamin D deficiency, unspecified: Secondary | ICD-10-CM | POA: Diagnosis not present

## 2014-09-11 DIAGNOSIS — Z124 Encounter for screening for malignant neoplasm of cervix: Secondary | ICD-10-CM

## 2014-09-11 DIAGNOSIS — C50412 Malignant neoplasm of upper-outer quadrant of left female breast: Secondary | ICD-10-CM | POA: Diagnosis not present

## 2014-09-11 DIAGNOSIS — Z01419 Encounter for gynecological examination (general) (routine) without abnormal findings: Secondary | ICD-10-CM

## 2014-09-11 DIAGNOSIS — Z0000 Encounter for general adult medical examination without abnormal findings: Secondary | ICD-10-CM

## 2014-09-11 DIAGNOSIS — R5383 Other fatigue: Secondary | ICD-10-CM

## 2014-09-11 DIAGNOSIS — E785 Hyperlipidemia, unspecified: Secondary | ICD-10-CM

## 2014-09-11 LAB — POCT URINALYSIS DIPSTICK
Bilirubin, UA: NEGATIVE
Blood, UA: NEGATIVE
Glucose, UA: NEGATIVE
Ketones, UA: NEGATIVE
Leukocytes, UA: NEGATIVE
Nitrite, UA: NEGATIVE
Protein, UA: NEGATIVE
UROBILINOGEN UA: NEGATIVE
pH, UA: 5

## 2014-09-11 LAB — LIPID PANEL
CHOLESTEROL: 224 mg/dL — AB (ref 0–200)
HDL: 97 mg/dL (ref >=46)
LDL Cholesterol: 112 mg/dL — ABNORMAL HIGH (ref 0–99)
Total CHOL/HDL Ratio: 2.3 Ratio
Triglycerides: 75 mg/dL (ref <150)
VLDL: 15 mg/dL (ref 0–40)

## 2014-09-11 LAB — TSH: TSH: 3.054 u[IU]/mL (ref 0.350–4.500)

## 2014-09-11 MED ORDER — ESTRADIOL 10 MCG VA TABS: 1.0000 | ORAL_TABLET | VAGINAL | Status: DC

## 2014-09-11 NOTE — Progress Notes (Unsigned)
[employer] approved vagifem '10mg'$  tablets from 08/29/14-indefinitely

## 2014-09-11 NOTE — Progress Notes (Signed)
69 y.o. G0P0000 DivorcedCaucasianF here for annual exam.  Very busy.  Still doing massage therapy and doing hot yoga.  Denies VB.  Reports biggest issues for her is fatigue and just how tired she gets at times.  Doesn't feel any better on days she doesn't work.  Is also frustrated with weight she can't seem to lose.  Has at least 10 pounds she would like to get off.    Patient's last menstrual period was 08/23/2008.          Sexually active: Yes.    The current method of family planning is post menopausal status.    Exercising: Yes.    hot yoga Smoker:  Former smoker  Health Maintenance: Pap:  09/27/12 WNL History of abnormal Pap:  no MMG:  09/09/14-diag in one year-h/o breast cancer Colonoscopy:  2012--Repeat in 10 years Dr Wallis Mart, mother, father, and paternal aunt with colon cancer BMD:   09/10/10-normal TDaP:  Years ago Screening Labs: Oct 2015, Hb today: 12.8, Urine today: negative   reports that she quit smoking about 22 years ago. Her smoking use included Cigarettes. She smoked 2.00 packs per day. She has never used smokeless tobacco. She reports that she drinks about 3.0 oz of alcohol per week. She reports that she does not use illicit drugs.  Past Medical History  Diagnosis Date  . Breast cancer     ER+PR+ HER-2NEU negative    Past Surgical History  Procedure Laterality Date  . Tonsillectomy    . Maxillofacial surgery  1980    mouth-ealign teeth-  . Facelift w/blepharoplasty  1999       . [place]  2007  . Breast lumpectomy with needle localization  02/29/2012    Procedure: BREAST LUMPECTOMY WITH NEEDLE LOCALIZATION;  Surgeon: Rolm Bookbinder, MD;  Location: [place];  Service: General;  Laterality: Left;  . Dilation and curettage of uterus  1998    submucus myoma-resected  . Cosmetic surgery    . Eye surgery    . Breast surgery      Current Outpatient Prescriptions  Medication Sig Dispense Refill  . Ascorbic Acid (VITAMIN C) 100 MG tablet Take  200 mg by mouth daily.    Marland Kitchen BIOTIN PO Take by mouth daily.    . cholecalciferol (VITAMIN D) 1000 UNITS tablet Take 1,000 Units by mouth daily.    . Estradiol 10 MCG TABS vaginal tablet Place 1 tablet (10 mcg total) vaginally as directed. Twice a month 6 tablet 1  . Multiple Vitamin (MULTIVITAMIN) tablet Take 1 tablet by mouth daily.    Marland Kitchen UNABLE TO FIND Take 1 capsule by mouth 2 (two) times daily. Pueraria Mirfica Supplement     No current facility-administered medications for this visit.    Family History  Problem Relation Age of Onset  . Cancer Mother   . Colon cancer Mother   . Cancer Father   . Colon cancer Father   . Rheumatic fever Sister   . Hepatitis Father   . Hiatal hernia Father   . Colon cancer Paternal Aunt     ROS:  Pertinent items are noted in HPI.  Otherwise, a comprehensive ROS was negative.  Exam:   General appearance: alert, cooperative and appears stated age Head: Normocephalic, without obvious abnormality, atraumatic Neck: no adenopathy, supple, symmetrical, trachea midline and thyroid normal to inspection and palpation Lungs: clear to auscultation bilaterally Breasts: normal appearance, no masses or tenderness Heart: regular rate and rhythm Abdomen: soft, non-tender; bowel sounds  normal; no masses,  no organomegaly Extremities: extremities normal, atraumatic, no cyanosis or edema Skin: Skin color, texture, turgor normal. No rashes or lesions Lymph nodes: Cervical, supraclavicular, and axillary nodes normal. No abnormal inguinal nodes palpated Neurologic: Grossly normal   Pelvic: External genitalia:  no lesions              Urethra:  normal appearing urethra with no masses, tenderness or lesions              Bartholins and Skenes: normal                 Vagina: normal appearing vagina with normal color and discharge, no lesions              Cervix: no lesions              Pap taken: No. Bimanual Exam:  Uterus:  normal size, contour, position,  consistency, mobility, non-tender              Adnexa: normal adnexa and no mass, fullness, tenderness               Rectovaginal: Confirms               Anus:  normal sphincter tone, no lesions  Chaperone was present for exam.  A:  Well Woman with normal exam H/O 1.1cm invasive ductal Ca, patient declines additional therapy, diagnosed 3 1/2 years ago PMP, no HRT Vaginal atrophic changes on low dosage vaginal estrogen (which was okayed by Dr. Humphrey Rolls) H/o low VitD Malaise and fatigue  P: Mammogram yearly.  Pt doing 3D MMGs now. Pt will return for Pap since she is not quite at two years from last one TSH, Lipids, and Vit D today return annually or prn

## 2014-09-11 NOTE — Telephone Encounter (Signed)
TC from patient inquiring about update on prior authorization for Estradiol tabs.She is having a difficult time dealing with [facility]

## 2014-09-12 LAB — VITAMIN D 25 HYDROXY (VIT D DEFICIENCY, FRACTURES): Vit D, 25-Hydroxy: 59 ng/mL (ref 30–100)

## 2014-09-15 LAB — HEMOGLOBIN, FINGERSTICK: HEMOGLOBIN, FINGERSTICK: 12.8 g/dL (ref 12.0–16.0)

## 2014-10-01 ENCOUNTER — Telehealth: Payer: Self-pay | Admitting: *Deleted

## 2014-10-01 NOTE — Telephone Encounter (Signed)
"  Jeannie called from [facility] on behalf of patient to obtain approval for Estradiol Implant pellet Prescribed by Dr. Lindi Adie.  Estradiol 10 mcg place 1 tablet vaginally as directed was ordered by Dr. Hale Bogus.  Jeannie asked for Dr. Ammie Ferrier contact information.  Phone number provided.

## 2014-10-02 ENCOUNTER — Telehealth: Payer: Self-pay | Admitting: Obstetrics & Gynecology

## 2014-10-02 NOTE — Telephone Encounter (Signed)
Called patient and Message left to return call to [place] at [phone].   Called Beverlee Nims at [facility] and left message that our office can complete prior authorization as Dr. Sabra Heck is the physician who is writing the prescription.   There is a form form [employer] that is scanned stating that the Vagifem 10 mcg have been approved. Will wait for return call from patient to discuss. Rx was printed so unsure what pharmacy it is at, called [facility] on [address] and they do not have this prescription for patient.

## 2014-10-02 NOTE — Telephone Encounter (Signed)
Spoke to Kelly Stuart at the [facility] who stated she wanted to know if Dr. Sabra Heck will be handling the appeal for the pt's medication (Estradiol). She states the pt can not do the necessary trial and error of medication due to her cancer diagnose. She is agreeable to a callback from triage.

## 2014-10-03 ENCOUNTER — Telehealth: Payer: Self-pay

## 2014-10-03 NOTE — Telephone Encounter (Signed)
Returning call.

## 2014-10-03 NOTE — Telephone Encounter (Signed)
Spoke with patient. She states that Dr. Lindi Adie wrote the prescription for her and she is unsure why the cancer center called our office.  Advised patient that it appears Vagifem has been approved by her insurance. Patient has prescription for generic vagifem.  Advised patient if she wants to try to fill it, which will be first step, then to have pharmacy fax over any denials and we can help if necessary. Pt thankful for phone call and has appointment for pap with Dr. Sabra Heck on Monday. Routing to provider for final review. Patient agreeable to disposition. Will close encounter.

## 2014-10-03 NOTE — Telephone Encounter (Signed)
Msg rcvd from Soquel at [facility], [place] [phone].  Per Olivia Mackie, they will take care of pre-auth needed for estring.

## 2014-10-06 ENCOUNTER — Ambulatory Visit (INDEPENDENT_AMBULATORY_CARE_PROVIDER_SITE_OTHER): Payer: Medicare Other | Admitting: Obstetrics & Gynecology

## 2014-10-06 ENCOUNTER — Encounter: Payer: Self-pay | Admitting: Obstetrics & Gynecology

## 2014-10-06 VITALS — BP 132/82 | HR 64 | Resp 16 | Wt 186.8 lb

## 2014-10-06 DIAGNOSIS — Z124 Encounter for screening for malignant neoplasm of cervix: Secondary | ICD-10-CM | POA: Diagnosis not present

## 2014-10-06 NOTE — Progress Notes (Signed)
Patient ID: Kelly Stuart, female   DOB: [birth year], 69 y.o.   MRN: [id number]   Pt here for pap only.  Had AEX a little before Pap smear would be covered.  We did check with insurance regarding this.  Pt desired to return for pap.  No new issues.  Exam:  NAEFG, vaginal with atrophic tissue, normal appearing cervix.  Pap obtained.  Assessment:  Here only for Pap Plan:  Pap results will be sent to pt if normal and called if abnormal.  Pt aware and grateful we checked insurance and had her return.

## 2014-10-08 LAB — IPS PAP SMEAR ONLY

## 2014-11-06 DIAGNOSIS — H43813 Vitreous degeneration, bilateral: Secondary | ICD-10-CM | POA: Diagnosis not present

## 2014-11-07 DIAGNOSIS — C50919 Malignant neoplasm of unspecified site of unspecified female breast: Secondary | ICD-10-CM | POA: Diagnosis not present

## 2014-12-16 ENCOUNTER — Ambulatory Visit (INDEPENDENT_AMBULATORY_CARE_PROVIDER_SITE_OTHER): Payer: Medicare Other | Admitting: Physician Assistant

## 2014-12-16 VITALS — BP 126/84 | HR 83 | Temp 98.0°F | Resp 18 | Ht 69.25 in | Wt 184.0 lb

## 2014-12-16 DIAGNOSIS — R21 Rash and other nonspecific skin eruption: Secondary | ICD-10-CM | POA: Diagnosis not present

## 2014-12-16 MED ORDER — VALACYCLOVIR HCL 1 G PO TABS: 1000.0000 mg | ORAL_TABLET | Freq: Three times a day (TID) | ORAL | Status: DC

## 2014-12-16 NOTE — Progress Notes (Signed)
12/17/2014 at 8:32 AM  Kelly Stuart / DOB: [birth year] / MRN: [id number]  The patient has Primary cancer of upper outer quadrant of left female breast on her problem list.  SUBJECTIVE  Kelly Stuart is a 69 y.o. well appearing female presenting for the chief complaint of facial rash that started two days ago.  She first noticed tingling and hyperesthesia along the left T2 nerve distribution, and last night she began to notice a tender red spot on her left cheek.  She has had shingles before and is concerned that this may be a recurrence. She denies fever, chills and nausea.  She does not complain of any eye symtpoms.   She  has a past medical history of Breast cancer.    Medications reviewed and updated by myself where necessary, and exist elsewhere in the encounter.   Ms. Mathisen is allergic to codeine; tetracyclines & related; penicillins; and sulfa antibiotics. She  reports that she quit smoking about 23 years ago. Her smoking use included Cigarettes. She smoked 2.00 packs per day. She has never used smokeless tobacco. She reports that she drinks about 3.0 oz of alcohol per week. She reports that she does not use illicit drugs. She  reports that she currently engages in sexual activity and has had female partners. The patient  has past surgical history that includes Tonsillectomy; maxillofacial surgery (1980); Facelift w/blepharoplasty (1999); minifacelift (2007); Breast lumpectomy with needle localization (02/29/2012); Dilation and curettage of uterus (1998); Cosmetic surgery; Eye surgery; and Breast surgery.  Her family history includes Cancer in her father and mother; Colon cancer in her father, mother, and paternal aunt; Hepatitis in her father; Hiatal hernia in her father; Rheumatic fever in her sister.  Review of Systems  Constitutional: Negative for fever and chills.  Respiratory: Negative for shortness of breath.   Cardiovascular: Negative for chest pain.  Gastrointestinal: Negative for  nausea and abdominal pain.  Genitourinary: Negative.   Skin: Positive for itching and rash.  Neurological: Negative for dizziness and headaches.    OBJECTIVE  Her  height is 5' 9.25" (1.759 m) and weight is 184 lb (83.462 kg). Her oral temperature is 98 F (36.7 C). Her blood pressure is 126/84 and her pulse is 83. Her respiration is 18 and oxygen saturation is 96%.  The patient's body mass index is 26.97 kg/(m^2).  Physical Exam  Constitutional: She is oriented to person, place, and time. She appears well-developed and well-nourished. No distress.  HENT:  Head:    Right Ear: Tympanic membrane normal.  Left Ear: Tympanic membrane normal.  Nose: Nose normal.  Mouth/Throat: Uvula is midline, oropharynx is clear and moist and mucous membranes are normal.  Eyes: Pupils are equal, round, and reactive to light. Left eye exhibits no chemosis, no discharge, no exudate and no hordeolum. No foreign body present in the left eye. Left conjunctiva is not injected. Left conjunctiva has no hemorrhage. Left eye exhibits normal extraocular motion.  Cardiovascular: Normal rate and regular rhythm.   Respiratory: Effort normal and breath sounds normal. No respiratory distress.  GI: She exhibits no distension.  Musculoskeletal: Normal range of motion.  Neurological: She is alert and oriented to person, place, and time.  Skin: Skin is warm and dry. She is not diaphoretic.  Psychiatric: She has a normal mood and affect. Her behavior is normal. Judgment and thought content normal.    No results found for this or any previous visit (from the past 24 hour(s)).  ASSESSMENT & PLAN  Reginae was seen today for rash.  Diagnoses and all orders for this visit:  Rash and nonspecific skin eruption: The rash would be an atypical presentation of Zoster, however given the T2 distribution will cover.  She is to RTC in 48 hours if the rash worsens.   -     valACYclovir (VALTREX) 1000 MG tablet; Take 1 tablet (1,000  mg total) by mouth 3 (three) times daily.   The patient was advised to call or come back to clinic if she does not see an improvement in symptoms, or worsens with the above plan.   Philis Fendt, MHS, PA-C [facility] 12/17/2014 8:32 AM

## 2015-06-08 ENCOUNTER — Ambulatory Visit: Payer: Medicare Other | Admitting: Podiatry

## 2015-06-08 ENCOUNTER — Ambulatory Visit (INDEPENDENT_AMBULATORY_CARE_PROVIDER_SITE_OTHER): Payer: Medicare Other | Admitting: Podiatry

## 2015-06-08 ENCOUNTER — Ambulatory Visit (INDEPENDENT_AMBULATORY_CARE_PROVIDER_SITE_OTHER): Payer: Medicare Other

## 2015-06-08 ENCOUNTER — Encounter: Payer: Self-pay | Admitting: Podiatry

## 2015-06-08 VITALS — BP 146/87 | HR 80 | Resp 16 | Ht 70.0 in | Wt 177.0 lb

## 2015-06-08 DIAGNOSIS — M79671 Pain in right foot: Secondary | ICD-10-CM

## 2015-06-08 DIAGNOSIS — M79672 Pain in left foot: Secondary | ICD-10-CM

## 2015-06-08 DIAGNOSIS — M779 Enthesopathy, unspecified: Secondary | ICD-10-CM | POA: Diagnosis not present

## 2015-06-08 NOTE — Progress Notes (Signed)
   Subjective:    Patient ID: Kelly Stuart, female    DOB: Apr 03, 1946, 70 y.o.   MRN: [id number]  HPI Patient presents with bilateral foot pain. Pt stated, "has a bad sciatic nerve, causes foot to cramp around toes".  Patient also wants to get orthotics made today.   Review of Systems  Musculoskeletal: Positive for myalgias.  All other systems reviewed and are negative.      Objective:   Physical Exam        Assessment & Plan:

## 2015-06-09 NOTE — Progress Notes (Signed)
Subjective:     Patient ID: Kelly Stuart, female   DOB: 1945-08-25, 70 y.o.   MRN: [id number]  HPI patient states her feet can bother her quite a bit and she has a history of a bad sciatic nerve that she received treatment for   Review of Systems  All other systems reviewed and are negative.      Objective:   Physical Exam  Constitutional: She is oriented to person, place, and time.  Cardiovascular: Intact distal pulses.   Musculoskeletal: Normal range of motion.  Neurological: She is oriented to person, place, and time.  Skin: Skin is warm and dry.  Nursing note and vitals reviewed.  neurovascular status intact with patient found to have mild reduction muscle strength left and reduction of neurological function DTR reflexes. Patient is noted to have moderate depression of the arch but no muscle loss currently and is noted to have plantar foot pain bilateral and also history of leg pain and pain secondary to nerve entrapment     Assessment:      inflammatory changes with sciatica noted and pain in the plantar feet bilateral    Plan:      H&P and x-rays reviewed. At this time we discussed the relationship to the leg and we went ahead and scanned her for custom orthotics to provide for plantar support. I'm get a make repair for lace up shoes we may consider a second pair long-term for other types of shoes   X-ray report indicates that there is some spur formation moderate depression of the arch but no indications of stress fracture fracture

## 2015-06-30 ENCOUNTER — Ambulatory Visit: Payer: Medicare Other | Admitting: *Deleted

## 2015-06-30 DIAGNOSIS — M779 Enthesopathy, unspecified: Secondary | ICD-10-CM

## 2015-06-30 NOTE — Progress Notes (Signed)
Patient ID: Kelly Stuart, female   DOB: [birth year], 70 y.o.   MRN: [id number] Patient presents for orthotic pick up.  Verbal and written break in and wear instructions given.  Patient will follow up in 4 weeks if symptoms worsen or fail to improve.

## 2015-06-30 NOTE — Patient Instructions (Signed)

## 2015-07-30 ENCOUNTER — Telehealth: Payer: Self-pay

## 2015-07-30 NOTE — Telephone Encounter (Signed)
LMOVM - overbooked on 4/18. Pt to return call if we can reschedule her

## 2015-08-11 ENCOUNTER — Ambulatory Visit: Payer: Medicare Other | Admitting: Hematology and Oncology

## 2015-08-18 ENCOUNTER — Ambulatory Visit (HOSPITAL_BASED_OUTPATIENT_CLINIC_OR_DEPARTMENT_OTHER): Payer: Medicare Other | Admitting: Hematology and Oncology

## 2015-08-18 ENCOUNTER — Telehealth: Payer: Self-pay | Admitting: Hematology and Oncology

## 2015-08-18 ENCOUNTER — Encounter: Payer: Self-pay | Admitting: Hematology and Oncology

## 2015-08-18 VITALS — BP 147/76 | HR 64 | Temp 98.2°F | Resp 18 | Wt 185.9 lb

## 2015-08-18 DIAGNOSIS — C50412 Malignant neoplasm of upper-outer quadrant of left female breast: Secondary | ICD-10-CM | POA: Diagnosis not present

## 2015-08-18 MED ORDER — ESTRADIOL 10 MCG VA TABS: 1.0000 | ORAL_TABLET | VAGINAL | Status: DC

## 2015-08-18 NOTE — Telephone Encounter (Signed)
appt made and avs printed °

## 2015-08-18 NOTE — Assessment & Plan Note (Signed)
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 She declined radiation and adjuvant antiestrogen therapies.  Breast cancer surveillance: 1. Breast exam 08/18/2015 is normal except for postsurgical changes 2. Mammogram 09/08/2014 was normal. Breast density category B. Patient uses vaginal estradiol.  Survivorship:Discussed the importance of physical exercise in decreasing the likelihood of breast cancer recurrence. Recommended 30 mins daily 6 days a week of either brisk walking or cycling or swimming. Encouraged patient to eat more fruits and vegetables and decrease red meat.   Patient participates in [place] hot yoga  She also gets some herbal medication from Dr.Moo and she does not know what is contained in these.   Return to clinic in 1 year for follow-up

## 2015-08-18 NOTE — Progress Notes (Signed)
Patient Care Team: No Pcp Per Patient as PCP - General (General Practice)  DIAGNOSIS: Primary cancer of upper outer quadrant of left female breast ([place])   Staging form: Breast, AJCC 7th Edition     Clinical: Stage IIA (Free text: T2 N0 M0) - Unsigned       Staging comments: Clinically Staged in Breast Conference 05/11/11      Pathologic: No stage assigned - Unsigned   SUMMARY OF ONCOLOGIC HISTORY:   Primary cancer of upper outer quadrant of left female breast ([place])   05/03/2011 Initial Diagnosis Left breast invasive ductal carcinoma T2 N0 M0 stage II a clinical stage, grade 1 ER/PR positive HER-2 negative   05/18/2011 - 03/12/2012 Anti-estrogen oral therapy Neoadjuvant Arimidex 1 mg daily   02/29/2012 Surgery Left breast lumpectomy without sentinel lymph node biopsy (patient declined), 1.1 cm well-differentiated IDC ER/PR positive HER-2 negative T1 cN0 M0 stage IA    Radiation Therapy Declined radiation therapy, also declined antiestrogen therapy    CHIEF COMPLIANT: surveillance of left breast cancer  INTERVAL HISTORY: Kelly Stuart is a 69 year old with above-mentioned history left breast cancer treated with lumpectomy and declined adjuvant radiation or antiestrogen therapy. She is here for annual follow-up and reports no new problems or concerns. 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 Denies any lumps or nodules in the breasts.she continues just participate in yoga 3-4 times a week.  REVIEW OF SYSTEMS:   Constitutional: Denies fevers, chills or abnormal weight loss Eyes: Denies blurriness of vision Ears, nose, mouth, throat, and face: Denies mucositis or sore throat Respiratory: Denies cough, dyspnea or wheezes Cardiovascular: Denies palpitation, chest discomfort Gastrointestinal:  Denies nausea, heartburn or change in bowel habits Skin: Denies abnormal skin rashes Lymphatics: Denies new lymphadenopathy or easy bruising Neurological:Denies numbness,  tingling or new weaknesses Behavioral/Psych: Mood is stable, no new changes  Extremities: No lower extremity edema Breast:  denies any pain or lumps or nodules in either breasts All other systems were reviewed with the patient and are negative.  I have reviewed the past medical history, past surgical history, social history and family history with the patient and they are unchanged from previous note.  ALLERGIES:  is allergic to codeine; tetracyclines & related; penicillins; and sulfa antibiotics.  MEDICATIONS:  Current Outpatient Prescriptions  Medication Sig Dispense Refill  . Ascorbic Acid (VITAMIN C) 100 MG tablet Take 200 mg by mouth daily.    Marland Kitchen BIOTIN PO Take by mouth daily.    . cholecalciferol (VITAMIN D) 1000 UNITS tablet Take 1,000 Units by mouth daily.    . Estradiol 10 MCG TABS vaginal tablet Place 1 tablet (10 mcg total) vaginally as directed. Twice a month 6 tablet 4  . Multiple Vitamin (MULTIVITAMIN) tablet Take 1 tablet by mouth daily.    Marland Kitchen UNABLE TO FIND Take 1 capsule by mouth 2 (two) times daily. [place]uer[place] Mirf[place] Supplement    . valACYclovir (VALTREX) 1000 MG tablet Take 1 tablet (1,000 mg total) by mouth 3 (three) times daily. 21 tablet 0   No current facility-administered medications for this visit.    PHYSICAL EXAMINATION: ECOG PERFORMANCE STATUS: 0 - Asymptomatic  Filed Vitals:   08/18/15 0924  BP: 147/76  Pulse: 64  Temp: 98.2 F (36.8 C)  Resp: 18   Filed Weights   08/18/15 0924  Weight: 185 lb 14.4 oz (84.324 kg)    GENERAL:alert, no distress and comfortable SKIN: skin color, texture, turgor are normal, no rashes or  significant lesions EYES: normal, Conjunctiva are pink and non-injected, sclera clear OROPHARYNX:no exudate, no erythema and lips, buccal mucosa, and tongue normal  NECK: supple, thyroid normal size, non-tender, without nodularity LYMPH:  no palpable lymphadenopathy in the cervical, axillary or inguinal LUNGS: clear to auscultation  and percussion with normal breathing effort HEART: regular rate & rhythm and no murmurs and no lower extremity edema ABDOMEN:abdomen soft, non-tender and normal bowel sounds MUSCULOSKELETAL:no cyanosis of digits and no clubbing  NEURO: alert & oriented x 3 with fluent speech, no focal motor/sensory deficits EXTREMITIES: No lower extremity edema BREAST: No palpable masses or nodules in either right or left breasts. No palpable axillary supraclavicular or infraclavicular adenopathy no breast tenderness or nipple discharge. (exam performed in the presence of a chaperone)  LABORATORY DATA:  I have reviewed the data as listed   Chemistry      Component Value Date/Time   NA 141 02/03/2014 1259   NA 137 02/28/2012 1030   K 4.2 02/03/2014 1259   K 4.3 02/28/2012 1030   CL 106 06/12/2012 1411   CL 100 02/28/2012 1030   CO2 25 02/03/2014 1259   CO2 28 02/28/2012 1030   BUN 13.1 02/03/2014 1259   BUN 12 02/28/2012 1030   CREATININE 0.8 02/03/2014 1259   CREATININE 0.70 02/28/2012 1030      Component Value Date/Time   CALCIUM 9.6 02/03/2014 1259   CALCIUM 9.7 02/28/2012 1030   ALKPHOS 56 02/03/2014 1259   ALKPHOS 52 09/05/2011 0903   AST 17 02/03/2014 1259   AST 21 09/05/2011 0903   ALT 17 02/03/2014 1259   ALT 14 09/05/2011 0903   BILITOT 0.59 02/03/2014 1259   BILITOT 0.7 09/05/2011 0903       Lab Results  Component Value Date   WBC 5.9 02/03/2014   HGB 12.8 09/11/2014   HCT 38.4 02/03/2014   MCV 97.8 02/03/2014   PLT 265 02/03/2014   NEUTROABS 3.2 02/03/2014   ASSESSMENT & PLAN:  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 She declined radiation and adjuvant antiestrogen therapies.  Breast cancer surveillance: 1. Breast exam 08/18/2015 is normal except for postsurgical changes 2. Mammogram 09/08/2014 was normal.  Breast density category B. Patient uses vaginal estradiol.  Survivorship:Discussed the importance of physical exercise in decreasing the likelihood of breast cancer recurrence. Recommended 30 mins daily 6 days a week of either brisk walking or cycling or swimming. Encouraged patient to eat more fruits and vegetables and decrease red meat.   Patient participates in [place] hot yoga   Return to clinic in 1 year for follow-up   No orders of the defined types were placed in this encounter.   The patient has a good understanding of the overall plan. she agrees with it. she will call with any problems that may develop before the next visit here.   Rulon Eisenmenger, MD 08/18/2015

## 2015-09-24 ENCOUNTER — Ambulatory Visit
Admission: RE | Admit: 2015-09-24 | Discharge: 2015-09-24 | Disposition: A | Discharge status: Home / Self Care | Payer: Medicare Other | Source: Ambulatory Visit | Attending: Family Medicine | Admitting: Family Medicine

## 2015-09-24 ENCOUNTER — Other Ambulatory Visit: Payer: Self-pay | Admitting: Family Medicine

## 2015-09-24 DIAGNOSIS — E7800 Pure hypercholesterolemia, unspecified: Secondary | ICD-10-CM | POA: Diagnosis not present

## 2015-09-24 DIAGNOSIS — M25551 Pain in right hip: Secondary | ICD-10-CM | POA: Diagnosis not present

## 2015-09-24 DIAGNOSIS — M549 Dorsalgia, unspecified: Secondary | ICD-10-CM

## 2015-09-24 DIAGNOSIS — E559 Vitamin D deficiency, unspecified: Secondary | ICD-10-CM | POA: Diagnosis not present

## 2015-09-24 DIAGNOSIS — M25559 Pain in unspecified hip: Secondary | ICD-10-CM | POA: Diagnosis not present

## 2015-09-24 DIAGNOSIS — M25552 Pain in left hip: Secondary | ICD-10-CM | POA: Diagnosis not present

## 2015-09-24 DIAGNOSIS — C50919 Malignant neoplasm of unspecified site of unspecified female breast: Secondary | ICD-10-CM | POA: Diagnosis not present

## 2015-09-24 DIAGNOSIS — M47816 Spondylosis without myelopathy or radiculopathy, lumbar region: Secondary | ICD-10-CM | POA: Diagnosis not present

## 2015-09-24 DIAGNOSIS — K635 Polyp of colon: Secondary | ICD-10-CM | POA: Diagnosis not present

## 2015-09-24 DIAGNOSIS — R5383 Other fatigue: Secondary | ICD-10-CM | POA: Diagnosis not present

## 2015-09-24 DIAGNOSIS — I10 Essential (primary) hypertension: Secondary | ICD-10-CM | POA: Diagnosis not present

## 2015-09-24 DIAGNOSIS — Z0000 Encounter for general adult medical examination without abnormal findings: Secondary | ICD-10-CM | POA: Diagnosis not present

## 2015-12-04 DIAGNOSIS — C50919 Malignant neoplasm of unspecified site of unspecified female breast: Secondary | ICD-10-CM | POA: Diagnosis not present

## 2015-12-07 ENCOUNTER — Other Ambulatory Visit: Payer: Self-pay | Admitting: Hematology and Oncology

## 2015-12-07 DIAGNOSIS — E2839 Other primary ovarian failure: Secondary | ICD-10-CM | POA: Diagnosis not present

## 2015-12-07 DIAGNOSIS — Z853 Personal history of malignant neoplasm of breast: Secondary | ICD-10-CM

## 2015-12-07 DIAGNOSIS — R921 Mammographic calcification found on diagnostic imaging of breast: Secondary | ICD-10-CM | POA: Diagnosis not present

## 2015-12-08 DIAGNOSIS — H43813 Vitreous degeneration, bilateral: Secondary | ICD-10-CM | POA: Diagnosis not present

## 2015-12-15 ENCOUNTER — Ambulatory Visit
Admission: RE | Admit: 2015-12-15 | Discharge: 2015-12-15 | Disposition: A | Discharge status: Home / Self Care | Payer: Medicare Other | Source: Ambulatory Visit | Attending: Hematology and Oncology | Admitting: Hematology and Oncology

## 2015-12-15 DIAGNOSIS — Z853 Personal history of malignant neoplasm of breast: Secondary | ICD-10-CM

## 2015-12-22 ENCOUNTER — Other Ambulatory Visit: Payer: Self-pay | Admitting: Hematology and Oncology

## 2015-12-22 DIAGNOSIS — R921 Mammographic calcification found on diagnostic imaging of breast: Secondary | ICD-10-CM

## 2015-12-30 ENCOUNTER — Ambulatory Visit
Admission: RE | Admit: 2015-12-30 | Discharge: 2015-12-30 | Disposition: A | Discharge status: Home / Self Care | Payer: Medicare Other | Source: Ambulatory Visit | Attending: Hematology and Oncology | Admitting: Hematology and Oncology

## 2015-12-30 DIAGNOSIS — R921 Mammographic calcification found on diagnostic imaging of breast: Secondary | ICD-10-CM

## 2016-01-05 DIAGNOSIS — M5442 Lumbago with sciatica, left side: Secondary | ICD-10-CM | POA: Diagnosis not present

## 2016-01-05 DIAGNOSIS — M4316 Spondylolisthesis, lumbar region: Secondary | ICD-10-CM | POA: Diagnosis not present

## 2016-01-06 ENCOUNTER — Other Ambulatory Visit: Payer: Self-pay | Admitting: Orthopaedic Surgery

## 2016-01-06 DIAGNOSIS — M545 Low back pain: Secondary | ICD-10-CM

## 2016-01-07 ENCOUNTER — Other Ambulatory Visit: Payer: Self-pay | Admitting: General Surgery

## 2016-01-07 DIAGNOSIS — D0502 Lobular carcinoma in situ of left breast: Secondary | ICD-10-CM

## 2016-01-14 ENCOUNTER — Other Ambulatory Visit: Payer: Self-pay | Admitting: General Surgery

## 2016-01-14 DIAGNOSIS — D0502 Lobular carcinoma in situ of left breast: Secondary | ICD-10-CM

## 2016-01-19 ENCOUNTER — Ambulatory Visit
Admission: RE | Admit: 2016-01-19 | Discharge: 2016-01-19 | Disposition: A | Discharge status: Home / Self Care | Payer: Medicare Other | Source: Ambulatory Visit | Attending: Orthopaedic Surgery | Admitting: Orthopaedic Surgery

## 2016-01-19 DIAGNOSIS — M4806 Spinal stenosis, lumbar region: Secondary | ICD-10-CM | POA: Diagnosis not present

## 2016-01-19 DIAGNOSIS — M545 Low back pain: Secondary | ICD-10-CM

## 2016-01-25 ENCOUNTER — Ambulatory Visit (INDEPENDENT_AMBULATORY_CARE_PROVIDER_SITE_OTHER): Payer: Medicare Other | Admitting: Orthopaedic Surgery

## 2016-01-25 DIAGNOSIS — M5442 Lumbago with sciatica, left side: Secondary | ICD-10-CM

## 2016-01-25 DIAGNOSIS — M4316 Spondylolisthesis, lumbar region: Secondary | ICD-10-CM

## 2016-02-08 ENCOUNTER — Encounter (HOSPITAL_BASED_OUTPATIENT_CLINIC_OR_DEPARTMENT_OTHER)
Admission: RE | Admit: 2016-02-08 | Discharge: 2016-02-08 | Disposition: A | Discharge status: Home / Self Care | Payer: Medicare Other | Source: Ambulatory Visit | Attending: General Surgery | Admitting: General Surgery

## 2016-02-08 ENCOUNTER — Encounter (HOSPITAL_BASED_OUTPATIENT_CLINIC_OR_DEPARTMENT_OTHER): Payer: Self-pay | Admitting: *Deleted

## 2016-02-08 DIAGNOSIS — N6022 Fibroadenosis of left breast: Secondary | ICD-10-CM | POA: Diagnosis not present

## 2016-02-08 DIAGNOSIS — N6321 Unspecified lump in the left breast, upper outer quadrant: Secondary | ICD-10-CM | POA: Diagnosis present

## 2016-02-08 DIAGNOSIS — D0502 Lobular carcinoma in situ of left breast: Secondary | ICD-10-CM | POA: Diagnosis not present

## 2016-02-08 DIAGNOSIS — Z87891 Personal history of nicotine dependence: Secondary | ICD-10-CM | POA: Diagnosis not present

## 2016-02-08 DIAGNOSIS — Z853 Personal history of malignant neoplasm of breast: Secondary | ICD-10-CM | POA: Diagnosis not present

## 2016-02-08 LAB — CBC
HCT: 39.8 % (ref 36.0–46.0)
HEMOGLOBIN: 12.8 g/dL (ref 12.0–15.0)
MCH: 31 pg (ref 26.0–34.0)
MCHC: 32.2 g/dL (ref 30.0–36.0)
MCV: 96.4 fL (ref 78.0–100.0)
Platelets: 296 10*3/uL (ref 150–400)
RBC: 4.13 MIL/uL (ref 3.87–5.11)
RDW: 13.1 % (ref 11.5–15.5)
WBC: 7 10*3/uL (ref 4.0–10.5)

## 2016-02-08 LAB — BASIC METABOLIC PANEL
ANION GAP: 7 (ref 5–15)
BUN: 12 mg/dL (ref 6–20)
CALCIUM: 9.5 mg/dL (ref 8.9–10.3)
CO2: 28 mmol/L (ref 22–32)
Chloride: 106 mmol/L (ref 101–111)
Creatinine, Ser: 0.77 mg/dL (ref 0.44–1.00)
Glucose, Bld: 105 mg/dL — ABNORMAL HIGH (ref 65–99)
Potassium: 3.8 mmol/L (ref 3.5–5.1)
SODIUM: 141 mmol/L (ref 135–145)

## 2016-02-08 LAB — DIFFERENTIAL
BASOS ABS: 0 10*3/uL (ref 0.0–0.1)
Basophils Relative: 0 %
Eosinophils Absolute: 0.1 10*3/uL (ref 0.0–0.7)
Eosinophils Relative: 2 %
LYMPHS ABS: 2.4 10*3/uL (ref 0.7–4.0)
LYMPHS PCT: 34 %
Monocytes Absolute: 0.5 10*3/uL (ref 0.1–1.0)
Monocytes Relative: 7 %
NEUTROS ABS: 3.9 10*3/uL (ref 1.7–7.7)
NEUTROS PCT: 57 %

## 2016-02-09 ENCOUNTER — Ambulatory Visit
Admission: RE | Admit: 2016-02-09 | Discharge: 2016-02-09 | Disposition: A | Discharge status: Home / Self Care | Payer: Medicare Other | Source: Ambulatory Visit | Attending: General Surgery | Admitting: General Surgery

## 2016-02-09 DIAGNOSIS — D0502 Lobular carcinoma in situ of left breast: Secondary | ICD-10-CM

## 2016-02-11 ENCOUNTER — Ambulatory Visit (HOSPITAL_BASED_OUTPATIENT_CLINIC_OR_DEPARTMENT_OTHER): Payer: Medicare Other | Admitting: Certified Registered"

## 2016-02-11 ENCOUNTER — Encounter (HOSPITAL_BASED_OUTPATIENT_CLINIC_OR_DEPARTMENT_OTHER): Payer: Self-pay | Admitting: Certified Registered"

## 2016-02-11 ENCOUNTER — Encounter (HOSPITAL_BASED_OUTPATIENT_CLINIC_OR_DEPARTMENT_OTHER)
Admission: RE | Disposition: A | Discharge status: Home / Self Care | Payer: Self-pay | Source: Ambulatory Visit | Attending: General Surgery

## 2016-02-11 ENCOUNTER — Ambulatory Visit (HOSPITAL_BASED_OUTPATIENT_CLINIC_OR_DEPARTMENT_OTHER)
Admission: RE | Admit: 2016-02-11 | Discharge: 2016-02-11 | Disposition: A | Discharge status: Home / Self Care | Payer: Medicare Other | Source: Ambulatory Visit | Attending: General Surgery | Admitting: General Surgery

## 2016-02-11 ENCOUNTER — Ambulatory Visit
Admission: RE | Admit: 2016-02-11 | Discharge: 2016-02-11 | Disposition: A | Discharge status: Home / Self Care | Payer: Medicare Other | Source: Ambulatory Visit | Attending: General Surgery | Admitting: General Surgery

## 2016-02-11 DIAGNOSIS — N6022 Fibroadenosis of left breast: Secondary | ICD-10-CM | POA: Insufficient documentation

## 2016-02-11 DIAGNOSIS — Z853 Personal history of malignant neoplasm of breast: Secondary | ICD-10-CM | POA: Insufficient documentation

## 2016-02-11 DIAGNOSIS — Z87891 Personal history of nicotine dependence: Secondary | ICD-10-CM | POA: Insufficient documentation

## 2016-02-11 DIAGNOSIS — D0502 Lobular carcinoma in situ of left breast: Secondary | ICD-10-CM | POA: Insufficient documentation

## 2016-02-11 HISTORY — PX: RADIOACTIVE SEED GUIDED EXCISIONAL BREAST BIOPSY: SHX6490

## 2016-02-11 HISTORY — DX: Anxiety disorder, unspecified: F41.9

## 2016-02-11 SURGERY — RADIOACTIVE SEED GUIDED BREAST BIOPSY
Anesthesia: General | Site: Breast

## 2016-02-11 MED ORDER — ONDANSETRON HCL 4 MG/2ML IJ SOLN
INTRAMUSCULAR | Status: AC
  Filled 2016-02-11: qty 2

## 2016-02-11 MED ORDER — ONDANSETRON HCL 4 MG/2ML IJ SOLN
INTRAMUSCULAR | Status: DC | PRN
  Administered 2016-02-11: 4 mg via INTRAVENOUS

## 2016-02-11 MED ORDER — BUPIVACAINE HCL (PF) 0.25 % IJ SOLN
INTRAMUSCULAR | Status: AC
  Filled 2016-02-11: qty 30

## 2016-02-11 MED ORDER — LIDOCAINE 2% (20 MG/ML) 5 ML SYRINGE
INTRAMUSCULAR | Status: DC | PRN
  Administered 2016-02-11: 100 mg via INTRAVENOUS

## 2016-02-11 MED ORDER — KETOROLAC TROMETHAMINE 30 MG/ML IJ SOLN
INTRAMUSCULAR | Status: AC
  Filled 2016-02-11: qty 1

## 2016-02-11 MED ORDER — MIDAZOLAM HCL 2 MG/2ML IJ SOLN: 1.0000 mg | INTRAMUSCULAR | Status: DC | PRN

## 2016-02-11 MED ORDER — SCOPOLAMINE 1 MG/3DAYS TD PT72
1.0000 | MEDICATED_PATCH | Freq: Once | TRANSDERMAL | Status: DC | PRN
  Administered 2016-02-11: 1.5 mg via TRANSDERMAL

## 2016-02-11 MED ORDER — LIDOCAINE HCL (PF) 1 % IJ SOLN
INTRAMUSCULAR | Status: AC
  Filled 2016-02-11: qty 30

## 2016-02-11 MED ORDER — PROMETHAZINE HCL 25 MG/ML IJ SOLN: 6.2500 mg | INTRAMUSCULAR | Status: DC | PRN

## 2016-02-11 MED ORDER — LIDOCAINE 2% (20 MG/ML) 5 ML SYRINGE
INTRAMUSCULAR | Status: AC
  Filled 2016-02-11: qty 5

## 2016-02-11 MED ORDER — DEXAMETHASONE SODIUM PHOSPHATE 4 MG/ML IJ SOLN
INTRAMUSCULAR | Status: DC | PRN
  Administered 2016-02-11: 10 mg via INTRAVENOUS

## 2016-02-11 MED ORDER — ACETAMINOPHEN 500 MG PO TABS
1000.0000 mg | ORAL_TABLET | ORAL | Status: AC
  Administered 2016-02-11: 1000 mg via ORAL

## 2016-02-11 MED ORDER — GLYCOPYRROLATE 0.2 MG/ML IJ SOLN: 0.2000 mg | Freq: Once | INTRAMUSCULAR | Status: DC | PRN

## 2016-02-11 MED ORDER — SCOPOLAMINE 1 MG/3DAYS TD PT72
MEDICATED_PATCH | TRANSDERMAL | Status: AC
  Filled 2016-02-11: qty 1

## 2016-02-11 MED ORDER — SCOPOLAMINE 1 MG/3DAYS TD PT72: 1.0000 | MEDICATED_PATCH | TRANSDERMAL | Status: DC

## 2016-02-11 MED ORDER — GABAPENTIN 300 MG PO CAPS
ORAL_CAPSULE | ORAL | Status: AC
  Filled 2016-02-11: qty 1

## 2016-02-11 MED ORDER — LACTATED RINGERS IV SOLN
INTRAVENOUS | Status: DC
  Administered 2016-02-11: 08:00:00 via INTRAVENOUS

## 2016-02-11 MED ORDER — KETOROLAC TROMETHAMINE 30 MG/ML IJ SOLN
INTRAMUSCULAR | Status: DC | PRN
  Administered 2016-02-11: 30 mg via INTRAVENOUS

## 2016-02-11 MED ORDER — PROPOFOL 500 MG/50ML IV EMUL
INTRAVENOUS | Status: AC
  Filled 2016-02-11: qty 50

## 2016-02-11 MED ORDER — BUPIVACAINE HCL (PF) 0.25 % IJ SOLN
INTRAMUSCULAR | Status: DC | PRN
  Administered 2016-02-11: 19 mL

## 2016-02-11 MED ORDER — ATROPINE SULFATE 0.4 MG/ML IJ SOLN
INTRAMUSCULAR | Status: AC
  Filled 2016-02-11: qty 1

## 2016-02-11 MED ORDER — DEXAMETHASONE SODIUM PHOSPHATE 10 MG/ML IJ SOLN
INTRAMUSCULAR | Status: AC
  Filled 2016-02-11: qty 1

## 2016-02-11 MED ORDER — PROPOFOL 10 MG/ML IV BOLUS
INTRAVENOUS | Status: AC
  Filled 2016-02-11: qty 20

## 2016-02-11 MED ORDER — PROPOFOL 10 MG/ML IV BOLUS
INTRAVENOUS | Status: DC | PRN
  Administered 2016-02-11: 30 mg via INTRAVENOUS
  Administered 2016-02-11: 20 mg via INTRAVENOUS
  Administered 2016-02-11: 150 mg via INTRAVENOUS

## 2016-02-11 MED ORDER — FENTANYL CITRATE (PF) 100 MCG/2ML IJ SOLN: 50.0000 ug | INTRAMUSCULAR | Status: DC | PRN

## 2016-02-11 MED ORDER — GABAPENTIN 300 MG PO CAPS
300.0000 mg | ORAL_CAPSULE | ORAL | Status: AC
  Administered 2016-02-11: 300 mg via ORAL

## 2016-02-11 MED ORDER — ACETAMINOPHEN 500 MG PO TABS
ORAL_TABLET | ORAL | Status: AC
  Filled 2016-02-11: qty 2

## 2016-02-11 SURGICAL SUPPLY — 58 items
BINDER BREAST LRG (GAUZE/BANDAGES/DRESSINGS) ×3 IMPLANT
BINDER BREAST MEDIUM (GAUZE/BANDAGES/DRESSINGS) IMPLANT
BINDER BREAST XLRG (GAUZE/BANDAGES/DRESSINGS) IMPLANT
BINDER BREAST XXLRG (GAUZE/BANDAGES/DRESSINGS) IMPLANT
BLADE SURG 15 STRL LF DISP TIS (BLADE) ×1 IMPLANT
BLADE SURG 15 STRL SS (BLADE) ×2
CANISTER SUC SOCK COL 7IN (MISCELLANEOUS) IMPLANT
CANISTER SUCT 1200ML W/VALVE (MISCELLANEOUS) IMPLANT
CHLORAPREP W/TINT 26ML (MISCELLANEOUS) ×3 IMPLANT
CLIP TI WIDE RED SMALL 6 (CLIP) ×3 IMPLANT
CLOSURE WOUND 1/2 X4 (GAUZE/BANDAGES/DRESSINGS) ×1
COVER BACK TABLE 60X90IN (DRAPES) ×3 IMPLANT
COVER MAYO STAND STRL (DRAPES) ×3 IMPLANT
COVER PROBE W GEL 5X96 (DRAPES) ×3 IMPLANT
DECANTER SPIKE VIAL GLASS SM (MISCELLANEOUS) IMPLANT
DERMABOND ADVANCED (GAUZE/BANDAGES/DRESSINGS) ×2
DERMABOND ADVANCED .7 DNX12 (GAUZE/BANDAGES/DRESSINGS) ×1 IMPLANT
DEVICE DUBIN W/COMP PLATE 8390 (MISCELLANEOUS) ×3 IMPLANT
DRAPE LAPAROSCOPIC ABDOMINAL (DRAPES) ×3 IMPLANT
DRAPE UTILITY XL STRL (DRAPES) ×3 IMPLANT
DRSG TEGADERM 4X4.75 (GAUZE/BANDAGES/DRESSINGS) IMPLANT
ELECT COATED BLADE 2.86 ST (ELECTRODE) ×3 IMPLANT
ELECT REM PT RETURN 9FT ADLT (ELECTROSURGICAL) ×3
ELECTRODE REM PT RTRN 9FT ADLT (ELECTROSURGICAL) ×1 IMPLANT
GLOVE BIO SURGEON STRL SZ7 (GLOVE) ×6 IMPLANT
GLOVE BIOGEL PI IND STRL 7.0 (GLOVE) ×2 IMPLANT
GLOVE BIOGEL PI IND STRL 7.5 (GLOVE) ×1 IMPLANT
GLOVE BIOGEL PI INDICATOR 7.0 (GLOVE) ×4
GLOVE BIOGEL PI INDICATOR 7.5 (GLOVE) ×2
GLOVE ECLIPSE 7.0 STRL STRAW (GLOVE) ×3 IMPLANT
GOWN STRL REUS W/ TWL LRG LVL3 (GOWN DISPOSABLE) ×2 IMPLANT
GOWN STRL REUS W/TWL LRG LVL3 (GOWN DISPOSABLE) ×4
ILLUMINATOR WAVEGUIDE N/F (MISCELLANEOUS) IMPLANT
KIT MARKER MARGIN INK (KITS) ×3 IMPLANT
LIGHT WAVEGUIDE WIDE FLAT (MISCELLANEOUS) IMPLANT
NEEDLE HYPO 25X1 1.5 SAFETY (NEEDLE) ×3 IMPLANT
NS IRRIG 1000ML POUR BTL (IV SOLUTION) IMPLANT
PACK BASIN DAY SURGERY FS (CUSTOM PROCEDURE TRAY) ×3 IMPLANT
PENCIL BUTTON HOLSTER BLD 10FT (ELECTRODE) ×3 IMPLANT
SHEET MEDIUM DRAPE 40X70 STRL (DRAPES) IMPLANT
SLEEVE SCD COMPRESS KNEE MED (MISCELLANEOUS) ×3 IMPLANT
SPONGE GAUZE 4X4 12PLY STER LF (GAUZE/BANDAGES/DRESSINGS) IMPLANT
SPONGE LAP 4X18 X RAY DECT (DISPOSABLE) ×3 IMPLANT
STRIP CLOSURE SKIN 1/2X4 (GAUZE/BANDAGES/DRESSINGS) ×2 IMPLANT
SUT MNCRL AB 4-0 PS2 18 (SUTURE) IMPLANT
SUT MON AB 5-0 PS2 18 (SUTURE) ×3 IMPLANT
SUT SILK 2 0 SH (SUTURE) IMPLANT
SUT VIC AB 2-0 SH 27 (SUTURE) ×2
SUT VIC AB 2-0 SH 27XBRD (SUTURE) ×1 IMPLANT
SUT VIC AB 3-0 SH 27 (SUTURE) ×2
SUT VIC AB 3-0 SH 27X BRD (SUTURE) ×1 IMPLANT
SUT VIC AB 5-0 PS2 18 (SUTURE) IMPLANT
SYR CONTROL 10ML LL (SYRINGE) ×3 IMPLANT
TOWEL OR 17X24 6PK STRL BLUE (TOWEL DISPOSABLE) ×3 IMPLANT
TOWEL OR NON WOVEN STRL DISP B (DISPOSABLE) ×3 IMPLANT
TUBE CONNECTING 20'X1/4 (TUBING)
TUBE CONNECTING 20X1/4 (TUBING) IMPLANT
YANKAUER SUCT BULB TIP NO VENT (SUCTIONS) IMPLANT

## 2016-02-11 NOTE — Interval H&P Note (Signed)
History and Physical Interval Note:  02/11/2016 8:13 AM  Kelly Stuart  has presented today for surgery, with the diagnosis of LEFT BREAST LOBULAR CARCINOMA IN SITU  The various methods of treatment have been discussed with the patient and family. After consideration of risks, benefits and other options for treatment, the patient has consented to  Procedure(s): LEFT BREAST RADIOACTIVE SEED GUIDED EXCISIONAL BIOPSY (N/A) as a surgical intervention .  The patient's history has been reviewed, patient examined, no change in status, stable for surgery.  I have reviewed the patient's chart and labs.  Questions were answered to the patient's satisfaction.     Ilka Lovick

## 2016-02-11 NOTE — Anesthesia Procedure Notes (Signed)
Procedure Name: LMA Insertion Date/Time: 02/11/2016 8:28 AM Performed by: Baxter Flattery Pre-anesthesia Checklist: Patient identified, Emergency Drugs available, Suction available and Patient being monitored Patient Re-evaluated:Patient Re-evaluated prior to inductionOxygen Delivery Method: Circle system utilized Preoxygenation: Pre-oxygenation with 100% oxygen Intubation Type: IV induction Ventilation: Mask ventilation without difficulty LMA: LMA inserted LMA Size: 3.0 Number of attempts: 1 Airway Equipment and Method: Bite block Placement Confirmation: positive ETCO2 and breath sounds checked- equal and bilateral Tube secured with: Tape Dental Injury: Teeth and Oropharynx as per pre-operative assessment

## 2016-02-11 NOTE — Anesthesia Postprocedure Evaluation (Signed)
Anesthesia Post Note  Patient: Kelly Stuart  Procedure(s) Performed: Procedure(s) (LRB): LEFT BREAST RADIOACTIVE SEED GUIDED EXCISIONAL BIOPSY (N/A)  Patient location during evaluation: PACU Anesthesia Type: General Level of consciousness: sedated Pain management: pain level controlled Vital Signs Assessment: post-procedure vital signs reviewed and stable Respiratory status: spontaneous breathing and respiratory function stable Cardiovascular status: stable Anesthetic complications: no    Last Vitals:  Vitals:   02/11/16 0915 02/11/16 0930  BP: 132/73 139/83  Pulse: 65 65  Resp: 17 14  Temp:      Last Pain:  Vitals:   02/11/16 0930  TempSrc:   PainSc: [place]

## 2016-02-11 NOTE — H&P (Signed)
Kelly Stuart is an 70 y.o. female.   Chief Complaint: history breast cancer, left breast lesion HPI: 34 yof who i recently saw in follow up for her left breast cancer. she was treated for a T2Nx er/pr pos, her2 negative tumor in 2013. she underwent neoadjuvant endocrine therapy from 1/13 to 11/13 by her choice. she then underwent left breast lumpectomy for what ended up being a 1.1cm tumor. she declined antiestrogen and radiation therapy. she was doing well and was due for her mm soon after I saw her. she has density c breasts. there are postop changes in the left breast with a new group of calcifications noted near the lumpectomy site. this is 4 mm in length. the right breast is negative and the other calcs are benign and stable. biopsy was done with pathology showing lcis. she is here to discuss this. she has no complaints again today   Past Medical History:  Diagnosis Date  . Anxiety   . Breast cancer ([place])    ER+PR+ HER-2NEU negative    Past Surgical History:  Procedure Laterality Date  . BREAST LUMPECTOMY WITH NEEDLE LOCALIZATION  02/29/2012   Procedure: BREAST LUMPECTOMY WITH NEEDLE LOCALIZATION;  Surgeon: Rolm Bookbinder, MD;  Location: [facility];  Service: General;  Laterality: Left;  . BREAST SURGERY    . COSMETIC SURGERY    . DILATION AND CURETTAGE OF UTERUS  1998   submucus myoma-resected  . EYE SURGERY    . FACELIFT W/BLEPHAROPLASTY  1999      . maxillofacial surgery  1980   mouth-ealign teeth-  . minifacelift  2007  . TONSILLECTOMY      Family History  Problem Relation Age of Onset  . Cancer Mother   . Colon cancer Mother   . Cancer Father   . Colon cancer Father   . Hepatitis Father   . Hiatal hernia Father   . Rheumatic fever Sister   . Colon cancer Paternal Aunt    Social History:  reports that she quit smoking about 24 years ago. Her smoking use included Cigarettes. She smoked 2.00 packs per day. She has never used smokeless  tobacco. She reports that she drinks about 3.0 oz of alcohol per week . She reports that she does not use drugs.  Allergies:  Allergies  Allergen Reactions  . Codeine Nausea And Vomiting  . Tetracyclines & Related     Patient states Acromycin  . Penicillins Rash  . Sulfa Antibiotics Rash    Medications Prior to Admission  Medication Sig Dispense Refill  . Ascorbic Acid (VITAMIN C) 100 MG tablet Take 200 mg by mouth daily.    Marland Kitchen BIOTIN PO Take by mouth daily.    . cholecalciferol (VITAMIN D) 1000 UNITS tablet Take 1,000 Units by mouth daily.    . Multiple Vitamin (MULTIVITAMIN) tablet Take 1 tablet by mouth daily.    . vitamin B-12 (CYANOCOBALAMIN) 500 MCG tablet Take 500 mcg by mouth daily.      No results found for this or any previous visit (from the past 48 hour(s)). Mm Lt Radioactive Seed Loc Mammo Guide  Result Date: 02/09/2016 CLINICAL DATA:  70 year old with a prior malignant lumpectomy of the upper outer left breast, recent biopsy-proven lobular carcinoma in-situ in the outer breast adjacent to the lumpectomy site. Patient is scheduled for surgical excision on 02/11/2016. Radioactive seed localization prior to lumpectomy. EXAM: MAMMOGRAPHIC GUIDED RADIOACTIVE SEED LOCALIZATION OF THE LEFT BREAST COMPARISON:  Previous exam(s). FINDINGS: Patient presents  for radioactive seed localization prior to excisional biopsy of the LCIS in the left breast. I met with the patient and we discussed the procedure of seed localization including benefits and alternatives. We discussed the high likelihood of a successful procedure. We discussed the risks of the procedure including infection, bleeding, tissue injury and further surgery. We discussed the low dose of radioactivity involved in the procedure. Informed, written consent was given. The usual time-out protocol was performed immediately prior to the procedure. Using mammographic guidance, sterile technique with chlorhexidine as skin antisepsis, 1%  lidocaine as local anesthesia and an I-125 radioactive seed, the X shaped tissue marker clip and the calcifications in the outer left breast were localized using a superior approach. The follow-up mammogram images confirm the seed in the expected location and were marked for Dr. Donne Hazel. Follow-up survey of the patient confirms the presence of the radioactive seed. Order number of I-125 seed:  [id number]. Total activity: 0.245 mCi  Reference Date: 01/13/2016 The patient tolerated the procedure well and was released from the [facility]. She was given instructions regarding seed removal. IMPRESSION: Radioactive seed localization of the left breast. No apparent complications. 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 Electronically Signed   By: Evangeline Dakin M.D.   On: 02/09/2016 15:30    ROS Negative  Blood pressure (!) 153/88, pulse 75, temperature 97.8 F (36.6 C), temperature source Oral, resp. rate 18, height '5\' 10"'  (1.778 m), weight 83 kg (183 lb), last menstrual period 08/23/2008, SpO2 99 %. Physical Exam  cv rrr pulm clear bilaterally No breast masses or adenopathy  Assessment/Plan  Assessment & Plan Rolm Bookbinder MD; 01/07/2016 9:09 PM) BREAST NEOPLASM, TIS (LCIS), LEFT (D05.02) Story: Left breast seed guided excisional biopsy we discussed the biopsy results. I do have some concern there may be more than lcis at that site and am not surprised due to lack of radiotherapy and antiestrogens. will plan for seed guided excisional biopsy. we discussed procedure, risks, recovery. we discussed at minimum would discuss antiestrogen postop and if any cancer present will likely need more surgery   Elainna Eshleman, MD 02/11/2016, 8:12 AM

## 2016-02-11 NOTE — Discharge Instructions (Signed)
[facility] °Office Phone Number [phone] ° °POST OP INSTRUCTIONS ° °Always review your discharge instruction sheet given to you by the facility where your surgery was performed. ° °IF YOU HAVE DISABILITY OR FAMILY LEAVE FORMS, YOU MUST BRING THEM TO THE OFFICE FOR PROCESSING.  DO NOT GIVE THEM TO YOUR DOCTOR. ° °1. A prescription for pain medication may be given to you upon discharge.  Take your pain medication as prescribed, if needed.  If narcotic pain medicine is not needed, then you may take acetaminophen (Tylenol), naprosyn (Alleve) or ibuprofen (Advil) as needed. °2. Take your usually prescribed medications unless otherwise directed °3. If you need a refill on your pain medication, please contact your pharmacy.  They will contact our office to request authorization.  Prescriptions will not be filled after 5pm or on week-ends. °4. You should eat very light the first 24 hours after surgery, such as soup, crackers, pudding, etc.  Resume your normal diet the day after surgery. °5. Most patients will experience some swelling and bruising in the breast.  Ice packs and a good support bra will help.  Wear the breast binder provided or a sports bra for 72 hours day and night.  After that wear a sports bra during the day until you return to the office. Swelling and bruising can take several days to resolve.  °6. It is common to experience some constipation if taking pain medication after surgery.  Increasing fluid intake and taking a stool softener will usually help or prevent this problem from occurring.  A mild laxative (Milk of Magnesia or Miralax) should be taken according to package directions if there are no bowel movements after 48 hours. °7. Unless discharge instructions indicate otherwise, you may remove your bandages 48 hours after surgery and you may shower at that time.  You may have steri-strips (small skin tapes) in place directly over the incision.  These strips should be left on the  skin for 7-10 days and will come off on their own.  If your surgeon used skin glue on the incision, you may shower in 24 hours.  The glue will flake off over the next 2-3 weeks.  Any sutures or staples will be removed at the office during your follow-up visit. °8. ACTIVITIES:  You may resume regular daily activities (gradually increasing) beginning the next day.  Wearing a good support bra or sports bra minimizes pain and swelling.  You may have sexual intercourse when it is comfortable. °a. You may drive when you no longer are taking prescription pain medication, you can comfortably wear a seatbelt, and you can safely maneuver your car and apply brakes. °b. RETURN TO WORK:  ______________________________________________________________________________________ °9. You should see your doctor in the office for a follow-up appointment approximately two weeks after your surgery.  Your doctor’s nurse will typically make your follow-up appointment when she calls you with your pathology report.  Expect your pathology report 3-4 business days after your surgery.  You may call to check if you do not hear from us after three days. °10. OTHER INSTRUCTIONS: _______________________________________________________________________________________________ _____________________________________________________________________________________________________________________________________ °_____________________________________________________________________________________________________________________________________ °_____________________________________________________________________________________________________________________________________ ° °WHEN TO CALL DR WAKEFIELD: °1. Fever over 101.0 °2. Nausea and/or vomiting. °3. Extreme swelling or bruising. °4. Continued bleeding from incision. °5. Increased pain, redness, or drainage from the incision. ° °The clinic staff is available to answer your questions during regular  business hours.  Please don’t hesitate to call and ask to speak to one of the nurses for clinical concerns.  If   you have a medical emergency, go to the nearest emergency room or call 911.  A surgeon from [facility] is always on call at the hospital. ° °For further questions, please visit [email or web address] ° ° ° °Post Anesthesia Home Care Instructions ° °Activity: °Get plenty of rest for the remainder of the day. A responsible adult should stay with you for 24 hours following the procedure.  °For the next 24 hours, DO NOT: °-Drive a car °-Operate machinery °-Drink alcoholic beverages °-Take any medication unless instructed by your physician °-Make any legal decisions or sign important papers. ° °Meals: °Start with liquid foods such as gelatin or soup. Progress to regular foods as tolerated. Avoid greasy, spicy, heavy foods. If nausea and/or vomiting occur, drink only clear liquids until the nausea and/or vomiting subsides. Call your physician if vomiting continues. ° °Special Instructions/Symptoms: °Your throat may feel dry or sore from the anesthesia or the breathing tube placed in your throat during surgery. If this causes discomfort, gargle with warm salt water. The discomfort should disappear within 24 hours. ° °If you had a scopolamine patch placed behind your ear for the management of post- operative nausea and/or vomiting: ° °1. The medication in the patch is effective for 72 hours, after which it should be removed.  Wrap patch in a tissue and discard in the trash. Wash hands thoroughly with soap and water. °2. You may remove the patch earlier than 72 hours if you experience unpleasant side effects which may include dry mouth, dizziness or visual disturbances. °3. Avoid touching the patch. Wash your hands with soap and water after contact with the patch. °  ° °

## 2016-02-11 NOTE — Transfer of Care (Signed)
Immediate Anesthesia Transfer of Care Note  Patient: Kelly Stuart  Procedure(s) Performed: Procedure(s): LEFT BREAST RADIOACTIVE SEED GUIDED EXCISIONAL BIOPSY (N/A)  Patient Location: PACU  Anesthesia Type:General  Level of Consciousness: awake, alert , oriented and patient cooperative  Airway & Oxygen Therapy: Patient Spontanous Breathing and Patient connected to face mask oxygen  Post-op Assessment: Report given to RN, Post -op Vital signs reviewed and stable and Patient moving all extremities  Post vital signs: Reviewed and stable  Last Vitals:  Vitals:   02/11/16 0732  BP: (!) 153/88  Pulse: 75  Resp: 18  Temp: 36.6 C    Last Pain:  Vitals:   02/11/16 0732  TempSrc: Oral      Patients Stated Pain Goal: 0 (24/81/85 [id number])  Complications: No apparent anesthesia complications

## 2016-02-11 NOTE — Op Note (Signed)
Preoperative diagnoses: leftbreast mass with core biopsy showing lcis, prior left breast cancer treated with lumpectomy declined radiotherapy Postoperative diagnosis: Same as above Procedure:Leftbreast seed guided excisional biopsy Surgeon: Dr. Serita Grammes Anesthesia: Gen. Estimated blood loss: Minimal Complications: None Drains: None Specimens:Leftbreast tissue marked with paint Sponge and needle count correct at completion Disposition to recovery stable  Indications: This is a Kelly Stuart who has prior left breast cancer treated by lumpectomy alone per her choice. She has new lesions near lumpectomy site that underwent core biopsy and is lcis. We discussed excision with seed guidance.  Procedure: After informed consent was obtained she was then taken to the operating room. She was given cefazolin. Sequential compression devices were on her legs. She was placed under general anesthesia without complication. Her leftbreast was then prepped and draped in the standard sterile surgical fashion. A surgical timeout was then performed.  I located the radioactive seed with the neoprobe.I infiltrated marcaine in the area of the seed.  I made a periareolar incision.I then used the neoprobe to guide the excision of the seed and surrounding tissue. This was confirmed by the neoprobe. This was painted. This was then taken for mammogram which confirmed removal of the seed and the clip.  This was confirmed by radiology. This was then sent to pathology. Hemostasis was observed.I closed the breast tissue with a 2-0 Vicryl. The dermis was closed with 3-0 Vicryl and the skin with 5-0 Monocryl.Dermabond and steristrips were placed on the incision. She was transferred to recovery stable

## 2016-02-11 NOTE — Anesthesia Preprocedure Evaluation (Addendum)
Anesthesia Evaluation  Patient identified by MRN, date of birth, ID band Patient awake    Reviewed: Allergy & Precautions, H&P , NPO status , Patient's Chart, lab work & pertinent test results  History of Anesthesia Complications Negative for: history of anesthetic complications  Airway Mallampati: I   Neck ROM: full    Dental no notable dental hx. (+) Teeth Intact, Caps, Dental Advidsory Given   Pulmonary former smoker,    Pulmonary exam normal breath sounds clear to auscultation       Cardiovascular negative cardio ROS  I Rhythm:regular Rate:Normal     Neuro/Psych PSYCHIATRIC DISORDERS Anxiety negative neurological ROS     GI/Hepatic negative GI ROS, Neg liver ROS,   Endo/Other  negative endocrine ROS  Renal/GU negative Renal ROS  negative genitourinary   Musculoskeletal   Abdominal   Peds  Hematology negative hematology ROS (+)   Anesthesia Other Findings   Reproductive/Obstetrics negative OB ROS                             Anesthesia Physical  Anesthesia Plan  ASA: II  Anesthesia Plan: General and General LMA   Post-op Pain Management:    Induction: Intravenous  Airway Management Planned: LMA  Additional Equipment:   Intra-op Plan:   Post-operative Plan: Extubation in OR  Informed Consent: I have reviewed the patients History and Physical, chart, labs and discussed the procedure including the risks, benefits and alternatives for the proposed anesthesia with the patient or authorized representative who has indicated his/her understanding and acceptance.   Dental advisory given  Plan Discussed with: CRNA and Surgeon  Anesthesia Plan Comments: (Pt requests no narcotics during this procedure or in PACU.  Will use ketorolac for analgesic intraop.)      Anesthesia Quick Evaluation

## 2016-02-12 ENCOUNTER — Encounter (HOSPITAL_BASED_OUTPATIENT_CLINIC_OR_DEPARTMENT_OTHER): Payer: Self-pay | Admitting: General Surgery

## 2016-02-29 ENCOUNTER — Encounter (INDEPENDENT_AMBULATORY_CARE_PROVIDER_SITE_OTHER): Payer: Self-pay | Admitting: Physical Medicine and Rehabilitation

## 2016-02-29 ENCOUNTER — Ambulatory Visit (INDEPENDENT_AMBULATORY_CARE_PROVIDER_SITE_OTHER): Payer: Medicare Other | Admitting: Physical Medicine and Rehabilitation

## 2016-02-29 VITALS — BP 143/78 | HR 99 | Temp 97.7°F

## 2016-02-29 DIAGNOSIS — M5416 Radiculopathy, lumbar region: Secondary | ICD-10-CM

## 2016-02-29 DIAGNOSIS — M4316 Spondylolisthesis, lumbar region: Secondary | ICD-10-CM

## 2016-02-29 MED ORDER — LIDOCAINE HCL (PF) 1 % IJ SOLN
0.3300 mL | Freq: Once | INTRAMUSCULAR | Status: AC
  Administered 2016-02-29: 0.3 mL

## 2016-02-29 MED ORDER — METHYLPREDNISOLONE ACETATE 80 MG/ML IJ SUSP
80.0000 mg | Freq: Once | INTRAMUSCULAR | Status: AC
  Administered 2016-02-29: 80 mg

## 2016-02-29 NOTE — Patient Instructions (Signed)

## 2016-02-29 NOTE — Procedures (Signed)
Lumbosacral Transforaminal Epidural Steroid Injection - Infraneural Approach with Fluoroscopic Guidance  Patient: Kelly Stuart      Date of Birth: Mar 18, 1946 MRN: [id number] PCP: No PCP Per Patient      Visit Date: 02/29/2016   Universal Protocol:    Date/Time: 11/06/171:42 PM  Consent Given By: the patient  Position: PRONE   Additional Comments: Vital signs were monitored before and after the procedure. Patient was prepped and draped in the usual sterile fashion. The correct patient, procedure, and site was verified.   Injection Procedure Details:  Procedure Site One Meds Administered:  Meds ordered this encounter  Medications  . lidocaine (PF) (XYLOCAINE) 1 % injection 0.3 mL  . methylPREDNISolone acetate (DEPO-MEDROL) injection 80 mg      Laterality: Left  Location/Site:  L5-S1  Needle size: 22 G  Needle type: Spinal  Needle Placement: Transforaminal  Findings:  -Contrast Used: 1 mL iohexol 180 mg iodine/mL   -Comments: Excellent flow of contrast along the nerve and into the epidural space.  Procedure Details: After squaring off the end-plates of the desired vertebral level to get a true AP view, the C-arm was obliqued to the painful side so that the superior articulating process is positioned about 1/3 the length of the inferior endplate.  The needle was aimed toward the junction of the superior articular process and the transverse process of the inferior vertebrae. The needle's initial entry is in the lower third of the foramen through Kambin's triangle. The soft tissues overlying this target were infiltrated with 2-3 ml. of 1% Lidocaine without Epinephrine.  The spinal needle was then inserted and advanced toward the target using a "trajectory" view along the fluoroscope beam.  Under AP and lateral visualization, the needle was advanced so it did not puncture dura and did not traverse medially beyond the 6 o'clock position of the pedicle. Bi-planar projections  were used to confirm position. Aspiration was confirmed to be negative for CSF and/or blood. A 1-2 ml. volume of Isovue-250 was injected and flow of contrast was noted at each level. Radiographs were obtained for documentation purposes.   After attaining the desired flow of contrast documented above, a 0.5 to 1.0 ml test dose of 0.25% Marcaine was injected into each respective transforaminal space.  The patient was observed for 90 seconds post injection.  After no sensory deficits were reported, and normal lower extremity motor function was noted,   the above injectate was administered so that equal amounts of the injectate were placed at each foramen (level) into the transforaminal epidural space.   Additional Comments:  The patient tolerated the procedure well Dressing: Band-Aid    Post-procedure details: Patient was observed during the procedure. Post-procedure instructions were reviewed.  Patient left the clinic in stable condition.

## 2016-02-29 NOTE — Progress Notes (Signed)
Kelly Stuart - 70 y.o. female MRN [id number]  Date of birth: 02/06/1946  Office Visit Note: Visit Date: 02/29/2016 PCP: No PCP Per Patient Referred by: No ref. provider found  Subjective: Chief Complaint  Patient presents with  . Lower Back - Pain   HPI: HPI Back pain x 4 to 5 years. Gotten worse over time. Radiates down left leg to foot. Numbness in foot. Feels like left foot is not stable.  Takes no blood thinners and no dye allergy  Has driver with her today-Judy Byrd    ROS Otherwise per HPI.  Assessment & Plan: Visit Diagnoses:  1. Lumbar radiculopathy   2. Spondylolisthesis of lumbar region     Plan: Findings:  Chronic worsening low back and left radicular pain. Patient has listhesis of L4 on L5 radicular complaints. Depending on the relief she gets with L5 transforaminal injection I would consider an L4-5 facet joint block.    Meds & Orders:  Meds ordered this encounter  Medications  . lidocaine (PF) (XYLOCAINE) 1 % injection 0.3 mL  . methylPREDNISolone acetate (DEPO-MEDROL) injection 80 mg    Orders Placed This Encounter  Procedures  . Epidural Steroid injection    Follow-up: Return for Dr. Ninfa Linden in 2 to 3 weeks.   Procedures: No procedures performed  Lumbosacral Transforaminal Epidural Steroid Injection - Infraneural Approach with Fluoroscopic Guidance  Patient: SHARIKA MOSQUERA      Date of Birth: 07-02-1945 MRN: [id number] PCP: No PCP Per Patient      Visit Date: 02/29/2016   Universal Protocol:    Date/Time: 11/06/171:42 PM  Consent Given By: the patient  Position: PRONE   Additional Comments: Vital signs were monitored before and after the procedure. Patient was prepped and draped in the usual sterile fashion. The correct patient, procedure, and site was verified.   Injection Procedure Details:  Procedure Site One Meds Administered:  Meds ordered this encounter  Medications  . lidocaine (PF) (XYLOCAINE) 1 % injection 0.3 mL  .  methylPREDNISolone acetate (DEPO-MEDROL) injection 80 mg      Laterality: Left  Location/Site:  L5-S1  Needle size: 22 G  Needle type: Spinal  Needle Placement: Transforaminal  Findings:  -Contrast Used: 1 mL iohexol 180 mg iodine/mL   -Comments: Excellent flow of contrast along the nerve and into the epidural space.  Procedure Details: After squaring off the end-plates of the desired vertebral level to get a true AP view, the C-arm was obliqued to the painful side so that the superior articulating process is positioned about 1/3 the length of the inferior endplate.  The needle was aimed toward the junction of the superior articular process and the transverse process of the inferior vertebrae. The needle's initial entry is in the lower third of the foramen through Kambin's triangle. The soft tissues overlying this target were infiltrated with 2-3 ml. of 1% Lidocaine without Epinephrine.  The spinal needle was then inserted and advanced toward the target using a "trajectory" view along the fluoroscope beam.  Under AP and lateral visualization, the needle was advanced so it did not puncture dura and did not traverse medially beyond the 6 o'clock position of the pedicle. Bi-planar projections were used to confirm position. Aspiration was confirmed to be negative for CSF and/or blood. A 1-2 ml. volume of Isovue-250 was injected and flow of contrast was noted at each level. Radiographs were obtained for documentation purposes.   After attaining the desired flow of contrast documented above, a 0.5 to  1.0 ml test dose of 0.25% Marcaine was injected into each respective transforaminal space.  The patient was observed for 90 seconds post injection.  After no sensory deficits were reported, and normal lower extremity motor function was noted,   the above injectate was administered so that equal amounts of the injectate were placed at each foramen (level) into the transforaminal epidural  space.   Additional Comments:  The patient tolerated the procedure well Dressing: Band-Aid    Post-procedure details: Patient was observed during the procedure. Post-procedure instructions were reviewed.  Patient left the clinic in stable condition.   Clinical History: Lspine MRI 01/19/2016 IMPRESSION: 1. L4-5 advanced facet arthropathy with anterolisthesis. Moderate to advanced canal stenosis with bilateral L5 compression in the subarticular recesses. 2. Noncompressive degenerative changes described above.  She reports that she quit smoking about 24 years ago. Her smoking use included Cigarettes. She smoked 2.00 packs per day. She has never used smokeless tobacco. No results for input(s): HGBA1C, LABURIC in the last 8760 hours.  Objective:  VS:  HT:    WT:   BMI:     BP:(!) 143/78  HR:99bpm  TEMP:97.7 F (36.5 C)(Oral)  RESP:100 % Physical Exam  Ortho Exam Imaging: No results found.  Past Medical/Family/Surgical/Social History: Medications & Allergies reviewed per EMR Patient Active Problem List   Diagnosis Date Noted  . Primary cancer of upper outer quadrant of left female breast ([place]) 05/05/2011   Past Medical History:  Diagnosis Date  . Anxiety   . Breast cancer ([place])    ER+PR+ HER-2NEU negative   Family History  Problem Relation Age of Onset  . Cancer Mother   . Colon cancer Mother   . Cancer Father   . Colon cancer Father   . Hepatitis Father   . Hiatal hernia Father   . Rheumatic fever Sister   . Colon cancer Paternal Aunt    Past Surgical History:  Procedure Laterality Date  . BREAST LUMPECTOMY WITH NEEDLE LOCALIZATION  02/29/2012   Procedure: BREAST LUMPECTOMY WITH NEEDLE LOCALIZATION;  Surgeon: Rolm Bookbinder, MD;  Location: [facility];  Service: General;  Laterality: Left;  . BREAST SURGERY    . COSMETIC SURGERY    . DILATION AND CURETTAGE OF UTERUS  1998   submucus myoma-resected  . EYE SURGERY    . FACELIFT  W/BLEPHAROPLASTY  1999      . maxillofacial surgery  1980   mouth-ealign teeth-  . minifacelift  2007  . RADIOACTIVE SEED GUIDED EXCISIONAL BREAST BIOPSY N/A 02/11/2016   Procedure: LEFT BREAST RADIOACTIVE SEED GUIDED EXCISIONAL BIOPSY;  Surgeon: Rolm Bookbinder, MD;  Location: [facility];  Service: General;  Laterality: N/A;  . 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 Smoking status: Former Smoker    Packs/day: 2.00    Types: Cigarettes    Quit date: 11/01/1991  . Smokeless tobacco: Never Used  . Alcohol use 3.0 oz/week    5 Standard drinks or equivalent per week     Comment: drinks beer daily  . Drug use: No  . Sexual activity: Yes    Partners: Male

## 2016-02-29 NOTE — Progress Notes (Deleted)
   Kelly Stuart - 70 y.o. female MRN [id number]  Date of birth: 21-Feb-1946  Office Visit Note: Visit Date: 02/29/2016 PCP: No PCP Per Patient Referred by: No ref. provider found  Subjective: No chief complaint on file.  HPI: HPI  ROS Otherwise per HPI.  Assessment & Plan: Visit Diagnoses: No diagnosis found.  Plan: No additional findings.   Meds & Orders: No orders of the defined types were placed in this encounter.  No orders of the defined types were placed in this encounter.   Follow-up: No Follow-up on file.   Procedures: No procedures performed  No notes on file   Clinical History: No specialty comments available.  She reports that she quit smoking about 24 years ago. Her smoking use included Cigarettes. She smoked 2.00 packs per day. She has never used smokeless tobacco. No results for input(s): HGBA1C, LABURIC in the last 8760 hours.  Objective:  VS:  HT:    WT:   BMI:     BP:   HR: bpm  TEMP: ( )  RESP:  Physical Exam  Ortho Exam Imaging: No results found.  Past Medical/Family/Surgical/Social History: Medications & Allergies reviewed per EMR Patient Active Problem List   Diagnosis Date Noted  . Primary cancer of upper outer quadrant of left female breast ([place]) 05/05/2011   Past Medical History:  Diagnosis Date  . Anxiety   . Breast cancer ([place])    ER+PR+ HER-2NEU negative   Family History  Problem Relation Age of Onset  . Cancer Mother   . Colon cancer Mother   . Cancer Father   . Colon cancer Father   . Hepatitis Father   . Hiatal hernia Father   . Rheumatic fever Sister   . Colon cancer Paternal Aunt    Past Surgical History:  Procedure Laterality Date  . BREAST LUMPECTOMY WITH NEEDLE LOCALIZATION  02/29/2012   Procedure: BREAST LUMPECTOMY WITH NEEDLE LOCALIZATION;  Surgeon: Rolm Bookbinder, MD;  Location: [facility];  Service: General;  Laterality: Left;  . BREAST SURGERY    . COSMETIC SURGERY    . DILATION AND  CURETTAGE OF UTERUS  1998   submucus myoma-resected  . EYE SURGERY    . FACELIFT W/BLEPHAROPLASTY  1999      . maxillofacial surgery  1980   mouth-ealign teeth-  . minifacelift  2007  . RADIOACTIVE SEED GUIDED EXCISIONAL BREAST BIOPSY N/A 02/11/2016   Procedure: LEFT BREAST RADIOACTIVE SEED GUIDED EXCISIONAL BIOPSY;  Surgeon: Rolm Bookbinder, MD;  Location: [facility];  Service: General;  Laterality: N/A;  . 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 Smoking status: Former Smoker    Packs/day: 2.00    Types: Cigarettes    Quit date: 11/01/1991  . Smokeless tobacco: Never Used  . Alcohol use 3.0 oz/week    5 Standard drinks or equivalent per week     Comment: drinks beer daily  . Drug use: No  . Sexual activity: Yes    Partners: Male

## 2016-03-02 ENCOUNTER — Telehealth: Payer: Self-pay | Admitting: Hematology and Oncology

## 2016-03-02 NOTE — Telephone Encounter (Signed)
lvm to inform pt of 11/27 appt date/time per LOs

## 2016-03-21 ENCOUNTER — Ambulatory Visit: Payer: Medicare Other | Admitting: Hematology and Oncology

## 2016-03-27 NOTE — Assessment & Plan Note (Signed)
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 She declined radiation and adjuvant antiestrogen therapies.  Breast cancer surveillance: 1. Breast exam 12/4//2017 is normal except for postsurgical changes 2. Mammogram 12/15/15 revealed indeterminate calcs, excision was LCIS Breast density category B. Patient uses vaginal estradiol.   Patient participates in [place] [employer]   Return to clinic in 1 year for follow-up

## 2016-03-28 ENCOUNTER — Encounter: Payer: Self-pay | Admitting: Pharmacist

## 2016-03-28 ENCOUNTER — Encounter: Payer: Self-pay | Admitting: Hematology and Oncology

## 2016-03-28 ENCOUNTER — Ambulatory Visit (HOSPITAL_BASED_OUTPATIENT_CLINIC_OR_DEPARTMENT_OTHER): Payer: Medicare Other | Admitting: Hematology and Oncology

## 2016-03-28 DIAGNOSIS — C50412 Malignant neoplasm of upper-outer quadrant of left female breast: Secondary | ICD-10-CM | POA: Diagnosis not present

## 2016-03-28 MED ORDER — LETROZOLE 2.5 MG PO TABS: 2.5000 mg | ORAL_TABLET | Freq: Every day | ORAL | 0 refills | Status: DC

## 2016-03-28 NOTE — Progress Notes (Signed)
Patient Care Team: No Pcp Per Patient as PCP - General (General Practice)  DIAGNOSIS:  Encounter Diagnosis  Name Primary?  . 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      05/18/2011 - 03/12/2012 Anti-estrogen oral therapy    Neoadjuvant Arimidex 1 mg daily      02/29/2012 Surgery    Left breast lumpectomy without sentinel lymph node biopsy (patient declined), 1.1 cm well-differentiated IDC ER/PR positive HER-2 negative T1 cN0 M0 stage IA       Radiation Therapy    Declined radiation therapy, also declined antiestrogen therapy      02/11/2016 Surgery    Left Lumpectomy: LCIS in radial scar       CHIEF COMPLIANT: Follow-up after recent lumpectomy for LCIS in the left breast  INTERVAL HISTORY: Kelly Stuart is a 70 year old with above-mentioned history of left breast cancer treated with lumpectomy followed by observation. She declined further adjuvant radiation on antiestrogen therapy. Recent mammograms revealed abnormalities in the left breast and dissection revealed LCIS. She is here today to discuss the pros and cons of using further antiestrogen therapy.  REVIEW OF SYSTEMS:   Constitutional: Denies fevers, chills or abnormal weight loss Eyes: Denies blurriness of vision Ears, nose, mouth, throat, and face: Denies mucositis or sore throat Respiratory: Denies cough, dyspnea or wheezes Cardiovascular: Denies palpitation, chest discomfort Gastrointestinal:  Denies nausea, heartburn or change in bowel habits Skin: Denies abnormal skin rashes Lymphatics: Denies new lymphadenopathy or easy bruising Neurological:Denies numbness, tingling or new weaknesses Behavioral/Psych: Mood is stable, no new changes    Extremities: No lower extremity edema Breast: Recent left lumpectomy All other systems were reviewed with the patient and are negative.  I have reviewed the past medical history, past surgical history, social history and family history with the patient and they are unchanged from previous note.  ALLERGIES:  is allergic to codeine; tetracyclines & related; penicillins; and sulfa antibiotics.  MEDICATIONS:  Current Outpatient Prescriptions  Medication Sig Dispense Refill  . Ascorbic Acid (VITAMIN C) 100 MG tablet Take 200 mg by mouth daily.    Marland Kitchen BIOTIN PO Take by mouth daily.    . cholecalciferol (VITAMIN D) 1000 UNITS tablet Take 1,000 Units by mouth daily.    . Multiple Vitamin (MULTIVITAMIN) tablet Take 1 tablet by mouth daily.    . vitamin B-12 (CYANOCOBALAMIN) 500 MCG tablet Take 500 mcg by mouth daily.     No current facility-administered medications for this visit.     PHYSICAL EXAMINATION: ECOG PERFORMANCE STATUS: 1 - Symptomatic but completely ambulatory  Vitals:   03/28/16 1518  BP: (!) 141/70  Pulse: 76  Resp: 18  Temp: 98 F (36.7 C)   Filed Weights   03/28/16 1518  Weight: 184 lb 3.2 oz (83.6 kg)    GENERAL:alert, no distress and comfortable SKIN: skin color, texture, turgor are normal, no rashes or significant lesions EYES: normal, Conjunctiva are pink and non-injected, sclera clear OROPHARYNX:no exudate, no erythema and lips, buccal mucosa, and tongue normal  NECK: supple, thyroid normal size, non-tender, without nodularity LYMPH:  no palpable lymphadenopathy in the cervical, axillary or inguinal LUNGS: clear to auscultation and percussion with normal breathing effort HEART:  regular rate & rhythm and no murmurs and no lower extremity edema ABDOMEN:abdomen soft, non-tender and normal bowel sounds MUSCULOSKELETAL:no cyanosis of digits and no clubbing  NEURO: alert & oriented x 3 with fluent speech, no focal motor/sensory deficits EXTREMITIES: No lower  extremity edema  LABORATORY DATA:  I have reviewed the data as listed   Chemistry      Component Value Date/Time   NA 141 02/08/2016 1511   NA 141 02/03/2014 1259   K 3.8 02/08/2016 1511   K 4.2 02/03/2014 1259   CL 106 02/08/2016 1511   CL 106 06/12/2012 1411   CO2 28 02/08/2016 1511   CO2 25 02/03/2014 1259   BUN 12 02/08/2016 1511   BUN 13.1 02/03/2014 1259   CREATININE 0.77 02/08/2016 1511   CREATININE 0.8 02/03/2014 1259      Component Value Date/Time   CALCIUM 9.5 02/08/2016 1511   CALCIUM 9.6 02/03/2014 1259   ALKPHOS 56 02/03/2014 1259   AST 17 02/03/2014 1259   ALT 17 02/03/2014 1259   BILITOT 0.59 02/03/2014 1259       Lab Results  Component Value Date   WBC 7.0 02/08/2016   HGB 12.8 02/08/2016   HCT 39.8 02/08/2016   MCV 96.4 02/08/2016   PLT 296 02/08/2016   NEUTROABS 3.9 02/08/2016    ASSESSMENT & PLAN:  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 She declined radiation and adjuvant antiestrogen therapies. 02/11/2016: Left lumpectomy: LCIS  I discussed with her that LCIS is a risk factor for breast cancer. I recommended that she take antiestrogen therapy to decrease the risk of recurrence. Patient has been reluctant to take any antiestrogen treatments previously. I recommended that the patient take letrozole 2.5 mg daily. I gave her 30 days supply of samples. If she tolerates it well then she will call us back so we can give her 90 day supply.  Breast cancer surveillance: 1. Breast exam 12/4//2017 is normal except for postsurgical changes 2. Mammogram 12/15/15 revealed indeterminate calcs, excision was LCIS Breast density category B. Patient uses vaginal estradiol.  Patient participates in [place] hot yoga   Return to clinic in April 2018 for follow-up   No orders of the defined types  were placed in this encounter.  The patient has a good understanding of the overall plan. she agrees with it. she will call with any problems that may develop before the next visit here.   Rulon Eisenmenger, MD 03/28/16

## 2016-03-28 NOTE — Progress Notes (Signed)
Oral Chemotherapy Pharmacist Encounter  Provided Femara samples to patient. Medication dispensed: Femara (letrozole) 2.'5mg'$  tablets Quantity dispensed: 30 tablets Days supply: 30 Directions for use: Take one tablet (2.'5mg'$  total) by mouth daily Lot: [id number] Exp: 12/2017 Manufacturer: Novartis  Medication will be added to patient's medication list.  Johny Drilling, PharmD, BCPS, BCOP 03/28/2016  4:55 PM [facility] [phone]

## 2016-04-06 ENCOUNTER — Ambulatory Visit (INDEPENDENT_AMBULATORY_CARE_PROVIDER_SITE_OTHER): Payer: Medicare Other | Admitting: Orthopaedic Surgery

## 2016-04-14 ENCOUNTER — Ambulatory Visit (INDEPENDENT_AMBULATORY_CARE_PROVIDER_SITE_OTHER): Payer: Medicare Other | Admitting: Orthopaedic Surgery

## 2016-05-09 ENCOUNTER — Telehealth (INDEPENDENT_AMBULATORY_CARE_PROVIDER_SITE_OTHER): Payer: Self-pay

## 2016-05-09 ENCOUNTER — Other Ambulatory Visit (INDEPENDENT_AMBULATORY_CARE_PROVIDER_SITE_OTHER): Payer: Self-pay

## 2016-05-09 ENCOUNTER — Ambulatory Visit (INDEPENDENT_AMBULATORY_CARE_PROVIDER_SITE_OTHER): Payer: Medicare Other | Admitting: Orthopaedic Surgery

## 2016-05-09 ENCOUNTER — Encounter (INDEPENDENT_AMBULATORY_CARE_PROVIDER_SITE_OTHER): Payer: Self-pay

## 2016-05-09 DIAGNOSIS — G8929 Other chronic pain: Secondary | ICD-10-CM | POA: Diagnosis not present

## 2016-05-09 DIAGNOSIS — M5442 Lumbago with sciatica, left side: Secondary | ICD-10-CM | POA: Diagnosis not present

## 2016-05-09 MED ORDER — LIDOCAINE 5 % EX PTCH: 1.0000 | MEDICATED_PATCH | CUTANEOUS | 0 refills | Status: DC

## 2016-05-09 MED ORDER — TRAMADOL HCL 50 MG PO TABS: 50.0000 mg | ORAL_TABLET | Freq: Two times a day (BID) | ORAL | 0 refills | Status: DC | PRN

## 2016-05-09 NOTE — Progress Notes (Signed)
Office Visit Note   Patient: Kelly Stuart           Date of Birth: 04/13/1946           MRN: [id number] Visit Date: 05/09/2016              Requested by: No referring provider defined for this encounter. PCP: No PCP Per Patient   Assessment & Plan: Visit Diagnoses:  1. Chronic left-sided low back pain with left-sided sciatica     Plan: Knee medications were entered in her for this visit. Also she did not get a trochanteric injection. This was entered for wrong patient. She is somewhat were treating for low back pain with the known L4-L5 spondylolisthesis. We will set her up for a repeat injection by Dr. Ernestina Patches. He can then get her back to me after his injection.  Follow-Up Instructions: Return in about 4 weeks (around 06/06/2016).   Orders:  Orders Placed This Encounter  Procedures  . Large Joint Injection/Arthrocentesis   Meds ordered this encounter  Medications  . DISCONTD: lidocaine (LIDODERM) 5 %    Sig: Place 1 patch onto the skin daily. Remove & Discard patch within 12 hours or as directed by MD    Dispense:  30 patch    Refill:  0  . DISCONTD: traMADol (ULTRAM) 50 MG tablet    Sig: Take 1-2 tablets (50-100 mg total) by mouth every 12 (twelve) hours as needed.    Dispense:  60 tablet    Refill:  0      Procedures: No procedures performed   Clinical Data: No additional findings.   Subjective: No chief complaint on file. She is someone who I sent to Dr. Ernestina Patches for a transforaminal injection of the left side at L5-S1. She tolerated this injection well and says she is about 40% better. This injection was done on 02/29/2016. She would like to actually have another injection by Dr. Ernestina Patches. She reports some leg weakness on the left side and still some sciatic symptoms but overall she feels like she is doing better. She feels like if she can just work on her yoga and core strengthening should also continue to improve but she would like to have another  injection.  HPI  Review of Systems She denies any bowel or bladder function.  Objective: Vital Signs: LMP 08/23/2008   Physical Exam He is alert and oriented 3. Ortho Exam He has excellent range of motion of her lumbar spine flexion and extension. She has excellent strength in her bilateral lower extremities normal sensation today. Specialty Comments:  No specialty comments available.  Imaging: No results found.   PMFS History: Patient Active Problem List   Diagnosis Date Noted  . Primary cancer of upper outer quadrant of left female breast ([place]) 05/05/2011   Past Medical History:  Diagnosis Date  . Anxiety   . Breast cancer ([place])    ER+PR+ HER-2NEU negative    Family History  Problem Relation Age of Onset  . Cancer Mother   . Colon cancer Mother   . Cancer Father   . Colon cancer Father   . Hepatitis Father   . Hiatal hernia Father   . Rheumatic fever Sister   . Colon cancer Paternal Aunt     Past Surgical History:  Procedure Laterality Date  . BREAST LUMPECTOMY WITH NEEDLE LOCALIZATION  02/29/2012   Procedure: BREAST LUMPECTOMY WITH NEEDLE LOCALIZATION;  Surgeon: Rolm Bookbinder, MD;  Location: [facility];  Service: General;  Laterality: Left;  . BREAST SURGERY    . COSMETIC SURGERY    . DILATION AND CURETTAGE OF UTERUS  1998   submucus myoma-resected  . EYE SURGERY    . FACELIFT W/BLEPHAROPLASTY  1999      . maxillofacial surgery  1980   mouth-ealign teeth-  . minifacelift  2007  . RADIOACTIVE SEED GUIDED EXCISIONAL BREAST BIOPSY N/A 02/11/2016   Procedure: LEFT BREAST RADIOACTIVE SEED GUIDED EXCISIONAL BIOPSY;  Surgeon: Rolm Bookbinder, MD;  Location: [facility];  Service: General;  Laterality: N/A;  . 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 Smoking status: Former Smoker     Packs/day: 2.00    Types: Cigarettes    Quit date: 11/01/1991  . Smokeless tobacco: Never Used  . Alcohol use 3.0 oz/week    5 Standard drinks or equivalent per week     Comment: drinks beer daily  . Drug use: No  . Sexual activity: Yes    Partners: Male

## 2016-05-09 NOTE — Telephone Encounter (Signed)
ERROR: Tramadol today

## 2016-05-17 DIAGNOSIS — H6123 Impacted cerumen, bilateral: Secondary | ICD-10-CM | POA: Diagnosis not present

## 2016-05-17 DIAGNOSIS — F322 Major depressive disorder, single episode, severe without psychotic features: Secondary | ICD-10-CM | POA: Diagnosis not present

## 2016-05-18 ENCOUNTER — Telehealth (INDEPENDENT_AMBULATORY_CARE_PROVIDER_SITE_OTHER): Payer: Self-pay | Admitting: Orthopaedic Surgery

## 2016-05-18 NOTE — Telephone Encounter (Signed)
Kelly Stuart with [facility] called advised the Rx for Lidocaine patches need prior auth. The number to contact Kelly Stuart is [phone]   The fax# is [phone]

## 2016-05-19 NOTE — Telephone Encounter (Signed)
Faxed for PA

## 2016-06-23 ENCOUNTER — Encounter (INDEPENDENT_AMBULATORY_CARE_PROVIDER_SITE_OTHER): Payer: Medicare Other | Admitting: Physical Medicine and Rehabilitation

## 2016-07-04 ENCOUNTER — Ambulatory Visit (INDEPENDENT_AMBULATORY_CARE_PROVIDER_SITE_OTHER): Payer: Medicare Other | Admitting: Physical Medicine and Rehabilitation

## 2016-07-04 ENCOUNTER — Ambulatory Visit (INDEPENDENT_AMBULATORY_CARE_PROVIDER_SITE_OTHER): Payer: Self-pay

## 2016-07-04 ENCOUNTER — Encounter (INDEPENDENT_AMBULATORY_CARE_PROVIDER_SITE_OTHER): Payer: Self-pay | Admitting: Physical Medicine and Rehabilitation

## 2016-07-04 VITALS — BP 155/95 | HR 68

## 2016-07-04 DIAGNOSIS — M5416 Radiculopathy, lumbar region: Secondary | ICD-10-CM | POA: Diagnosis not present

## 2016-07-04 MED ORDER — LIDOCAINE HCL (PF) 1 % IJ SOLN
0.3300 mL | Freq: Once | INTRAMUSCULAR | Status: AC
  Administered 2016-07-04: 0.3 mL

## 2016-07-04 MED ORDER — METHYLPREDNISOLONE ACETATE 80 MG/ML IJ SUSP
80.0000 mg | Freq: Once | INTRAMUSCULAR | Status: AC
  Administered 2016-07-04: 80 mg

## 2016-07-04 NOTE — Patient Instructions (Signed)

## 2016-07-04 NOTE — Progress Notes (Signed)
Kelly Stuart - 71 y.o. female MRN [id number]  Date of birth: 07-19-1945  Office Visit Note: Visit Date: 07/04/2016 PCP: No PCP Per Patient Referred by: No ref. provider found  Subjective: Chief Complaint  Patient presents with  . 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 She reports that the last injection which was a left L5 transforaminal epidural steroid injection gave her complete relief for several weeks and ongoing relief. Still 75%  Better than before injection. Radiates down leg to ankle. Numbness and tingling in foot.     ROS Otherwise per HPI.  Assessment & Plan: Visit Diagnoses:  1. Lumbar radiculopathy     Plan: Findings:  Left L5 transforaminal epidural steroid injection. Patient has pretty significant facet arthropathy at L4-5 with some canal stenosis and particularly lateral recess stenosis.    Meds & Orders:  Meds ordered this encounter  Medications  . lidocaine (PF) (XYLOCAINE) 1 % injection 0.3 mL  . methylPREDNISolone acetate (DEPO-MEDROL) injection 80 mg    Orders Placed This Encounter  Procedures  . XR C-ARM NO REPORT  . Epidural Steroid injection    Follow-up: Return if symptoms worsen or fail to improve, 2 weeks.   Procedures: No procedures performed  Lumbosacral Transforaminal Epidural Steroid Injection - Infraneural Approach with Fluoroscopic Guidance  Patient: Kelly Stuart      Date of Birth: 04/20/1946 MRN: [id number] PCP: No PCP Per Patient      Visit Date: 07/04/2016   Universal Protocol:    Date/Time: 07/04/1808:37 AM  Consent Given By: the patient  Position: PRONE   Additional Comments: Vital signs were monitored before and after the procedure. Patient was prepped and draped in the usual sterile fashion. The correct patient, procedure, and site was verified.   Injection Procedure Details:  Procedure Site  One Meds Administered:  Meds ordered this encounter  Medications  . lidocaine (PF) (XYLOCAINE) 1 % injection 0.3 mL  . methylPREDNISolone acetate (DEPO-MEDROL) injection 80 mg      Laterality: Left  Location/Site:  L5-S1  Needle size: 22 G  Needle type: Spinal  Needle Placement: Transforaminal  Findings:  -Contrast Used: 1 mL iohexol 180 mg iodine/mL   -Comments: Excellent flow of contrast along the nerve and into the epidural space.  Procedure Details: After squaring off the end-plates of the desired vertebral level to get a true AP view, the C-arm was obliqued to the painful side so that the superior articulating process is positioned about 1/3 the length of the inferior endplate.  The needle was aimed toward the junction of the superior articular process and the transverse process of the inferior vertebrae. The needle's initial entry is in the lower third of the foramen through Kambin's triangle. The soft tissues overlying this target were infiltrated with 2-3 ml. of 1% Lidocaine without Epinephrine.  The spinal needle was then inserted and advanced toward the target using a "trajectory" view along the fluoroscope beam.  Under AP and lateral visualization, the needle was advanced so it did not puncture dura and did not traverse medially beyond the 6 o'clock position of the pedicle. Bi-planar projections were used to confirm position. Aspiration was confirmed to be negative for CSF and/or blood. A 1-2 ml. volume of Isovue-250 was injected and flow of contrast was noted at each level. Radiographs were obtained for documentation purposes.   After attaining  the desired flow of contrast documented above, a 0.5 to 1.0 ml test dose of 0.25% Marcaine was injected into each respective transforaminal space.  The patient was observed for 90 seconds post injection.  After no sensory deficits were reported, and normal lower extremity motor function was noted,   the above injectate was administered  so that equal amounts of the injectate were placed at each foramen (level) into the transforaminal epidural space.   Additional Comments:  The patient tolerated the procedure well No complications occurred Dressing: Band-Aid    Post-procedure details: Patient was observed during the procedure. Post-procedure instructions were reviewed.  Patient left the clinic in stable condition.   Clinical History: Lspine MRI 01/19/2016 IMPRESSION: 1. L4-5 advanced facet arthropathy with anterolisthesis. Moderate to advanced canal stenosis with bilateral L5 compression in the subarticular recesses. 2. Noncompressive degenerative changes described above.  She reports that she quit smoking about 24 years ago. Her smoking use included Cigarettes. She smoked 2.00 packs per day. She has never used smokeless tobacco. No results for input(s): HGBA1C, LABURIC in the last 8760 hours.  Objective:  VS:  HT:    WT:   BMI:     BP:(!) 155/95  HR:68bpm  TEMP: ( )  RESP:97 % Physical Exam  Ortho Exam Imaging: No results found.  Past Medical/Family/Surgical/Social History: Medications & Allergies reviewed per EMR Patient Active Problem List   Diagnosis Date Noted  . Primary cancer of upper outer quadrant of left female breast ([place]) 05/05/2011   Past Medical History:  Diagnosis Date  . Anxiety   . Breast cancer ([place])    ER+PR+ HER-2NEU negative   Family History  Problem Relation Age of Onset  . Cancer Mother   . Colon cancer Mother   . Cancer Father   . Colon cancer Father   . Hepatitis Father   . Hiatal hernia Father   . Rheumatic fever Sister   . Colon cancer Paternal Aunt    Past Surgical History:  Procedure Laterality Date  . BREAST LUMPECTOMY WITH NEEDLE LOCALIZATION  02/29/2012   Procedure: BREAST LUMPECTOMY WITH NEEDLE LOCALIZATION;  Surgeon: Rolm Bookbinder, MD;  Location: [facility];  Service: General;  Laterality: Left;  . BREAST SURGERY    . COSMETIC SURGERY     . DILATION AND CURETTAGE OF UTERUS  1998   submucus myoma-resected  . EYE SURGERY    . FACELIFT W/BLEPHAROPLASTY  1999      . maxillofacial surgery  1980   mouth-ealign teeth-  . minifacelift  2007  . RADIOACTIVE SEED GUIDED EXCISIONAL BREAST BIOPSY N/A 02/11/2016   Procedure: LEFT BREAST RADIOACTIVE SEED GUIDED EXCISIONAL BIOPSY;  Surgeon: Rolm Bookbinder, MD;  Location: [facility];  Service: General;  Laterality: N/A;  . 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 Smoking status: Former Smoker    Packs/day: 2.00    Types: Cigarettes    Quit date: 11/01/1991  . Smokeless tobacco: Never Used  . Alcohol use 3.0 oz/week    5 Standard drinks or equivalent per week     Comment: drinks beer daily  . Drug use: No  . Sexual activity: Yes    Partners: Male

## 2016-07-04 NOTE — Procedures (Signed)
Lumbosacral Transforaminal Epidural Steroid Injection - Infraneural Approach with Fluoroscopic Guidance  Patient: Kelly Stuart      Date of Birth: November 02, 1945 MRN: [id number] PCP: No PCP Per Patient      Visit Date: 07/04/2016   Universal Protocol:    Date/Time: 07/04/1808:37 AM  Consent Given By: the patient  Position: PRONE   Additional Comments: Vital signs were monitored before and after the procedure. Patient was prepped and draped in the usual sterile fashion. The correct patient, procedure, and site was verified.   Injection Procedure Details:  Procedure Site One Meds Administered:  Meds ordered this encounter  Medications  . lidocaine (PF) (XYLOCAINE) 1 % injection 0.3 mL  . methylPREDNISolone acetate (DEPO-MEDROL) injection 80 mg      Laterality: Left  Location/Site:  L5-S1  Needle size: 22 G  Needle type: Spinal  Needle Placement: Transforaminal  Findings:  -Contrast Used: 1 mL iohexol 180 mg iodine/mL   -Comments: Excellent flow of contrast along the nerve and into the epidural space.  Procedure Details: After squaring off the end-plates of the desired vertebral level to get a true AP view, the C-arm was obliqued to the painful side so that the superior articulating process is positioned about 1/3 the length of the inferior endplate.  The needle was aimed toward the junction of the superior articular process and the transverse process of the inferior vertebrae. The needle's initial entry is in the lower third of the foramen through Kambin's triangle. The soft tissues overlying this target were infiltrated with 2-3 ml. of 1% Lidocaine without Epinephrine.  The spinal needle was then inserted and advanced toward the target using a "trajectory" view along the fluoroscope beam.  Under AP and lateral visualization, the needle was advanced so it did not puncture dura and did not traverse medially beyond the 6 o'clock position of the pedicle. Bi-planar projections  were used to confirm position. Aspiration was confirmed to be negative for CSF and/or blood. A 1-2 ml. volume of Isovue-250 was injected and flow of contrast was noted at each level. Radiographs were obtained for documentation purposes.   After attaining the desired flow of contrast documented above, a 0.5 to 1.0 ml test dose of 0.25% Marcaine was injected into each respective transforaminal space.  The patient was observed for 90 seconds post injection.  After no sensory deficits were reported, and normal lower extremity motor function was noted,   the above injectate was administered so that equal amounts of the injectate were placed at each foramen (level) into the transforaminal epidural space.   Additional Comments:  The patient tolerated the procedure well No complications occurred Dressing: Band-Aid    Post-procedure details: Patient was observed during the procedure. Post-procedure instructions were reviewed.  Patient left the clinic in stable condition.

## 2016-08-16 NOTE — Assessment & Plan Note (Signed)
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 She declined radiation and adjuvant antiestrogen therapies. 02/11/2016: Left lumpectomy: LCIS  I discussed with her that LCIS is a risk factor for breast cancer. I recommended that she take antiestrogen therapy to decrease the risk of recurrence. Patient has been reluctant to take any antiestrogen treatments previously. I recommended that the patient take letrozole 2.5 mg daily. I gave her 30 days supply of samples. If she tolerates it well then she will call us back so we can give her 90 day supply.  Breast cancer surveillance: 1. Breast exam 12/4//2017 is normal except for postsurgical changes 2. Mammogram 12/15/15 revealed indeterminate calcs, excision was LCIS Breast density category B. Patient uses vaginal estradiol.  Patient participates in [place] hot yoga   Return to clinic in 1 year for follow-up

## 2016-08-17 ENCOUNTER — Ambulatory Visit (HOSPITAL_BASED_OUTPATIENT_CLINIC_OR_DEPARTMENT_OTHER): Payer: Medicare Other | Admitting: Hematology and Oncology

## 2016-08-17 ENCOUNTER — Encounter: Payer: Self-pay | Admitting: Hematology and Oncology

## 2016-08-17 DIAGNOSIS — C50412 Malignant neoplasm of upper-outer quadrant of left female breast: Secondary | ICD-10-CM

## 2016-08-17 NOTE — Progress Notes (Signed)
Patient Care Team: No Pcp Per Patient as PCP - General (General Practice)  DIAGNOSIS:  Encounter Diagnosis  Name Primary?  . 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      05/18/2011 - 03/12/2012 Anti-estrogen oral therapy    Neoadjuvant Arimidex 1 mg daily      02/29/2012 Surgery    Left breast lumpectomy without sentinel lymph node biopsy (patient declined), 1.1 cm well-differentiated IDC ER/PR positive HER-2 negative T1 cN0 M0 stage IA       Radiation Therapy    Declined radiation therapy, also declined antiestrogen therapy      02/11/2016 Surgery    Left Lumpectomy: LCIS in radial scar       CHIEF COMPLIANT: Patient has not started letrozole  INTERVAL HISTORY: Kelly Stuart is a 71 year old with above-mentioned history left breast cancer treated with lumpectomy in 2013 and declined antiestrogen therapy at that time. 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 She is waiting for her leg pains improved. She is getting cortisol injections which have helped her. She has joined the gym and has been exercising regularly.  REVIEW OF SYSTEMS:   Constitutional: Denies fevers, chills or abnormal weight loss Eyes: Denies blurriness of vision Ears, nose, mouth, throat, and face: Denies mucositis or sore throat Respiratory: Denies cough, dyspnea or wheezes Cardiovascular: Denies palpitation, chest discomfort Gastrointestinal:  Denies nausea, heartburn or change in bowel habits Skin: Denies abnormal skin rashes Lymphatics: Denies new lymphadenopathy or easy bruising Neurological:Denies numbness, tingling or new weaknesses Behavioral/Psych:  Mood is stable, no new changes  Extremities: No lower extremity edema Breast:  denies any pain or lumps or nodules in either breasts All other systems were reviewed with the patient and are negative.  I have reviewed the past medical history, past surgical history, social history and family history with the patient and they are unchanged from previous note.  ALLERGIES:  is allergic to codeine; tetracyclines & related; penicillins; and sulfa antibiotics.  MEDICATIONS:  Current Outpatient Prescriptions  Medication Sig Dispense Refill  . Ascorbic Acid (VITAMIN C) 100 MG tablet Take 200 mg by mouth daily.    Marland Kitchen BIOTIN PO Take by mouth daily.    . cholecalciferol (VITAMIN D) 1000 UNITS tablet Take 1,000 Units by mouth daily.    Marland Kitchen escitalopram (LEXAPRO) 10 MG tablet     . letrozole (FEMARA) 2.5 MG tablet Take 1 tablet (2.5 mg total) by mouth daily. 30 tablet 0  . Multiple Vitamin (MULTIVITAMIN) tablet Take 1 tablet by mouth daily.    . vitamin B-12 (CYANOCOBALAMIN) 500 MCG tablet Take 500 mcg by mouth daily.     No current facility-administered medications for this visit.     PHYSICAL EXAMINATION: ECOG PERFORMANCE STATUS: 1 - Symptomatic but completely ambulatory  Vitals:   08/17/16 0933  BP: (!) 147/78  Pulse: 66  Resp: 17  Temp: 98.2 F (36.8 C)   Filed Weights   08/17/16 0933  Weight: 185 lb 4.8 oz (84.1 kg)    GENERAL:alert, no distress and comfortable SKIN: skin color, texture, turgor are normal, no rashes or significant lesions EYES:  normal, Conjunctiva are pink and non-injected, sclera clear OROPHARYNX:no exudate, no erythema and lips, buccal mucosa, and tongue normal  NECK: supple, thyroid normal size, non-tender, without nodularity LYMPH:  no palpable lymphadenopathy in the cervical, axillary or inguinal LUNGS: clear to auscultation and percussion with normal breathing effort HEART: regular rate & rhythm and no murmurs and no lower extremity edema ABDOMEN:abdomen  soft, non-tender and normal bowel sounds MUSCULOSKELETAL:no cyanosis of digits and no clubbing  NEURO: alert & oriented x 3 with fluent speech, no focal motor/sensory deficits EXTREMITIES: No lower extremity edema   LABORATORY DATA:  I have reviewed the data as listed   Chemistry      Component Value Date/Time   NA 141 02/08/2016 1511   NA 141 02/03/2014 1259   K 3.8 02/08/2016 1511   K 4.2 02/03/2014 1259   CL 106 02/08/2016 1511   CL 106 06/12/2012 1411   CO2 28 02/08/2016 1511   CO2 25 02/03/2014 1259   BUN 12 02/08/2016 1511   BUN 13.1 02/03/2014 1259   CREATININE 0.77 02/08/2016 1511   CREATININE 0.8 02/03/2014 1259      Component Value Date/Time   CALCIUM 9.5 02/08/2016 1511   CALCIUM 9.6 02/03/2014 1259   ALKPHOS 56 02/03/2014 1259   AST 17 02/03/2014 1259   ALT 17 02/03/2014 1259   BILITOT 0.59 02/03/2014 1259       Lab Results  Component Value Date   WBC 7.0 02/08/2016   HGB 12.8 02/08/2016   HCT 39.8 02/08/2016   MCV 96.4 02/08/2016   PLT 296 02/08/2016   NEUTROABS 3.9 02/08/2016    ASSESSMENT & PLAN:  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 She declined radiation and adjuvant antiestrogen therapies. 02/11/2016: Left lumpectomy: LCIS  I discussed with her that LCIS is a risk factor for breast cancer. I recommended that she take antiestrogen therapy to decrease the risk of recurrence. Patient has been reluctant to take any antiestrogen treatments previously. I recommended that the patient take letrozole 2.5 mg daily. I gave her 30 days supply of samples. If she tolerates it well then she will call us back so we can give her 90 day supply. She hasn't started it yet and will think about it in the next 3 months or so.  Breast cancer surveillance: 1. Breast exam 12/4//2017 is normal except for  postsurgical changes 2. Mammogram 12/15/15 revealed indeterminate calcs, excision was LCIS Breast density category B. Patient uses vaginal estradiol.  Patient participates in [employer]   Return to clinic in 6 months  for follow-up   I spent 25 minutes talking to the patient of which more than half was spent in counseling and coordination of care.  No orders of the defined types were placed in this encounter.  The patient has a good understanding of the overall plan. she agrees with it. she will call with any problems that may develop before the next visit here.   Rulon Eisenmenger, MD 08/17/16

## 2016-09-21 DIAGNOSIS — J329 Chronic sinusitis, unspecified: Secondary | ICD-10-CM | POA: Diagnosis not present

## 2016-10-21 DIAGNOSIS — I10 Essential (primary) hypertension: Secondary | ICD-10-CM | POA: Diagnosis not present

## 2016-10-21 DIAGNOSIS — R05 Cough: Secondary | ICD-10-CM | POA: Diagnosis not present

## 2016-10-24 ENCOUNTER — Ambulatory Visit
Admission: RE | Admit: 2016-10-24 | Discharge: 2016-10-24 | Disposition: A | Discharge status: Home / Self Care | Payer: Medicare Other | Source: Ambulatory Visit | Attending: Family Medicine | Admitting: Family Medicine

## 2016-10-24 ENCOUNTER — Other Ambulatory Visit: Payer: Self-pay | Admitting: Family Medicine

## 2016-10-24 DIAGNOSIS — R059 Cough, unspecified: Secondary | ICD-10-CM

## 2016-10-24 DIAGNOSIS — R05 Cough: Secondary | ICD-10-CM

## 2016-11-02 ENCOUNTER — Ambulatory Visit
Admission: RE | Admit: 2016-11-02 | Discharge: 2016-11-02 | Disposition: A | Discharge status: Home / Self Care | Payer: Medicare Other | Source: Ambulatory Visit | Attending: Family Medicine | Admitting: Family Medicine

## 2016-11-02 ENCOUNTER — Other Ambulatory Visit: Payer: Self-pay | Admitting: Family Medicine

## 2016-11-02 ENCOUNTER — Other Ambulatory Visit: Payer: Medicare Other

## 2016-11-02 DIAGNOSIS — R911 Solitary pulmonary nodule: Secondary | ICD-10-CM

## 2016-11-02 MED ORDER — IOPAMIDOL (ISOVUE-300) INJECTION 61%
75.0000 mL | Freq: Once | INTRAVENOUS | Status: AC | PRN
  Administered 2016-11-02: 75 mL via INTRAVENOUS

## 2016-11-03 ENCOUNTER — Other Ambulatory Visit: Payer: Medicare Other

## 2016-11-14 ENCOUNTER — Ambulatory Visit (HOSPITAL_BASED_OUTPATIENT_CLINIC_OR_DEPARTMENT_OTHER): Payer: Medicare Other | Admitting: Hematology and Oncology

## 2016-11-14 ENCOUNTER — Telehealth: Payer: Self-pay

## 2016-11-14 ENCOUNTER — Encounter: Payer: Self-pay | Admitting: Hematology and Oncology

## 2016-11-14 DIAGNOSIS — R918 Other nonspecific abnormal finding of lung field: Secondary | ICD-10-CM

## 2016-11-14 DIAGNOSIS — C50412 Malignant neoplasm of upper-outer quadrant of left female breast: Secondary | ICD-10-CM | POA: Diagnosis not present

## 2016-11-14 NOTE — Telephone Encounter (Signed)
Called pt to confirm her pet scan schedule next wed. 11/23/16. Call back number provided to call and confirm appt.LVM

## 2016-11-14 NOTE — Assessment & Plan Note (Signed)
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 She declined radiation and adjuvant antiestrogen therapies. 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  Radiology review: I reviewed the CT scan with the patient detail and provided her with a copy of this report.  Plan: 1. Referral to pulmonology for bronchoscopy and biopsy 2. PET/CT scan 3. Systemic therapy based upon pathology findings  I discussed the patient that presence of breast cancer cells in the lung would suggest stage IV/metastatic disease. The goals of treatment for metastatic disease are considered palliative in nature. We cannot cure metastatic disease but become prolonged life. There are number of advances metastatic breast cancer however I would like to confirm the diagnosis with the tissue biopsy.  Alternately this could also be a new primary like lung cancer, although less likely since BRCA mutations not have increased risk for lung cancers.  Return to clinic after biopsy to discuss the treatment plan.

## 2016-11-14 NOTE — Progress Notes (Signed)
Patient Care Team: Jonathon Jordan, MD as PCP - General (Family Medicine)  DIAGNOSIS:  Encounter Diagnoses  Name Primary?  . Primary cancer of upper outer quadrant of left female breast ([place])   . 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      05/18/2011 - 03/12/2012 Anti-estrogen oral therapy    Neoadjuvant Arimidex 1 mg daily      02/29/2012 Surgery    Left breast lumpectomy without sentinel lymph node biopsy (patient declined), 1.1 cm well-differentiated IDC ER/PR positive HER-2 negative T1 cN0 M0 stage IA       Radiation Therapy    Declined radiation therapy, also declined antiestrogen therapy      02/11/2016 Surgery    Left Lumpectomy: 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       CHIEF COMPLIANT: Follow-up to discuss recently performed CT chest  INTERVAL HISTORY: Kelly Stuart is a 71 year old with above-mentioned history of left breast cancer diagnosed in 2013 who underwent lumpectomy followed by observation. She did not take antiestrogen therapy. She presented with a 4 month episode of cough. She had multiple antibiotics and recent chest x-ray showed nodules in the lungs which led to a CT of the chest. The CT scan showed multiple abnormalities in the lungs especially in the right lung and the right hilum as well as mediastinum. She also had bilateral adrenal nodules  and few bone lesions. She was referred back was for an urgent evaluation. She does have mild discomfort in the posterior left eighth rib area but otherwise does not have any new symptoms. She is been feeling quite well from this cough and recent episodes of infection and pneumonia.  REVIEW OF SYSTEMS:   Constitutional: Denies fevers, chills or abnormal weight loss Eyes: Denies blurriness of vision Ears, nose, mouth, throat, and face: Denies mucositis or sore throat Respiratory: Cough Cardiovascular: Denies palpitation, chest discomfort Gastrointestinal:  Denies nausea, heartburn or change in bowel habits Skin: Denies abnormal skin rashes Lymphatics: Denies new lymphadenopathy or easy bruising Neurological:Denies numbness, tingling or new weaknesses Behavioral/Psych: Mood is stable, no new changes  Extremities: No lower extremity edema Breast:  denies any pain or lumps or nodules in either breasts All other systems were reviewed with the patient and are negative.  I have reviewed the past medical history, past surgical history, social history and family history with the patient and they are unchanged from previous note.  ALLERGIES:  is allergic to codeine; tetracyclines & related; penicillins; and sulfa antibiotics.  MEDICATIONS:  Current Outpatient Prescriptions  Medication Sig Dispense Refill  . Ascorbic Acid (VITAMIN C) 100 MG tablet Take 200 mg by mouth daily.    Marland Kitchen BIOTIN PO Take by mouth daily.    . cholecalciferol (VITAMIN D) 1000 UNITS tablet Take 1,000 Units by mouth daily.    Marland Kitchen escitalopram (LEXAPRO) 10 MG tablet     . letrozole (  FEMARA) 2.5 MG tablet Take 1 tablet (2.5 mg total) by mouth daily. 30 tablet 0  . Multiple Vitamin (MULTIVITAMIN) tablet Take 1 tablet by mouth daily.    . vitamin B-12 (CYANOCOBALAMIN) 500 MCG tablet Take 500 mcg by mouth daily.     No current facility-administered medications for this visit.     PHYSICAL EXAMINATION: ECOG PERFORMANCE STATUS: 1 -  Symptomatic but completely ambulatory  Vitals:   11/14/16 0841  BP: (!) 164/78  Pulse: 83  Resp: 17  Temp: 98.2 F (36.8 C)   Filed Weights   11/14/16 0841  Weight: 179 lb 4.8 oz (81.3 kg)    GENERAL:alert, no distress and comfortable SKIN: skin color, texture, turgor are normal, no rashes or significant lesions EYES: normal, Conjunctiva are pink and non-injected, sclera clear OROPHARYNX:no exudate, no erythema and lips, buccal mucosa, and tongue normal  NECK: supple, thyroid normal size, non-tender, without nodularity LYMPH:  no palpable lymphadenopathy in the cervical, axillary or inguinal LUNGS: clear to auscultation and percussion with normal breathing effort HEART: regular rate & rhythm and no murmurs and no lower extremity edema ABDOMEN:abdomen soft, non-tender and normal bowel sounds MUSCULOSKELETAL:no cyanosis of digits and no clubbing  NEURO: alert & oriented x 3 with fluent speech, no focal motor/sensory deficits EXTREMITIES: No lower extremity edema   LABORATORY DATA:  I have reviewed the data as listed   Chemistry      Component Value Date/Time   NA 141 02/08/2016 1511   NA 141 02/03/2014 1259   K 3.8 02/08/2016 1511   K 4.2 02/03/2014 1259   CL 106 02/08/2016 1511   CL 106 06/12/2012 1411   CO2 28 02/08/2016 1511   CO2 25 02/03/2014 1259   BUN 12 02/08/2016 1511   BUN 13.1 02/03/2014 1259   CREATININE 0.77 02/08/2016 1511   CREATININE 0.8 02/03/2014 1259      Component Value Date/Time   CALCIUM 9.5 02/08/2016 1511   CALCIUM 9.6 02/03/2014 1259   ALKPHOS 56 02/03/2014 1259   AST 17 02/03/2014 1259   ALT 17 02/03/2014 1259   BILITOT 0.59 02/03/2014 1259       Lab Results  Component Value Date   WBC 7.0 02/08/2016   HGB 12.8 02/08/2016   HCT 39.8 02/08/2016   MCV 96.4 02/08/2016   PLT 296 02/08/2016   NEUTROABS 3.9 02/08/2016    ASSESSMENT & PLAN:  Primary cancer of upper outer quadrant of left female breast Left breast invasive ductal  carcinoma treated with neoadjuvant antiestrogen therapy from January 2013 to November 2013 followed by lumpectomy. She declined radiation and adjuvant antiestrogen therapies. 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  Radiology review: I reviewed the CT scan with the patient detail and provided her with a copy of this report.  Plan: 1. Referral to Interventional radiology for a adrenal biopsy  (based on my discussion with Dr. Merilynn Finland ) 2. PET/CT scan 3. Systemic therapy based upon pathology findings  I called and discussed the case with Dr. Earleen Newport with interventional radiology who is willing to perform this CT-guided biopsy of the adrenal gland. If it is  positive for lung cancer then we will need molecular testing but if it is positive for breast cancer then we need breast prognostic profile.  I discussed the patient that presence of breast cancer cells in the lung would suggest stage IV/metastatic disease. The goals of treatment for metastatic disease are considered palliative in nature. We cannot cure metastatic disease but become prolonged life. There are number of advances metastatic breast cancer however I would like to confirm the diagnosis with the tissue biopsy.  Return to clinic after biopsy to discuss the treatment plan.  I spent 25 minutes talking to the patient of which more than half was spent in counseling and coordination of care.  Orders Placed This Encounter  Procedures  . NM PET Image Initial (PI) Skull Base To Thigh    Standing Status:   Future      Standing Expiration Date:   11/14/2017    Order Specific Question:   Reason for Exam (SYMPTOM  OR DIAGNOSIS REQUIRED)    Answer:   Lung nodules with breast cancer history    Order Specific Question:   If indicated for the ordered procedure, I authorize the administration of a radiopharmaceutical per Radiology protocol    Answer:   Yes    Order Specific Question:   Preferred imaging location?    Answer:   [facility]    Order Specific Question:   Radiology Contrast Protocol - do NOT remove file path    Answer:   [email or web address]   The patient has a good understanding of the overall plan. she agrees with it. she will call with any problems that may develop before the next visit here.   Rulon Eisenmenger, MD 11/14/16

## 2016-11-18 ENCOUNTER — Other Ambulatory Visit: Payer: Self-pay | Admitting: Radiology

## 2016-11-19 DIAGNOSIS — C7801 Secondary malignant neoplasm of right lung: Secondary | ICD-10-CM | POA: Diagnosis not present

## 2016-11-19 DIAGNOSIS — R1031 Right lower quadrant pain: Secondary | ICD-10-CM | POA: Diagnosis not present

## 2016-11-19 DIAGNOSIS — R109 Unspecified abdominal pain: Secondary | ICD-10-CM | POA: Diagnosis not present

## 2016-11-19 DIAGNOSIS — C779 Secondary and unspecified malignant neoplasm of lymph node, unspecified: Secondary | ICD-10-CM | POA: Diagnosis not present

## 2016-11-19 DIAGNOSIS — C413 Malignant neoplasm of ribs, sternum and clavicle: Secondary | ICD-10-CM | POA: Diagnosis not present

## 2016-11-19 DIAGNOSIS — Z79899 Other long term (current) drug therapy: Secondary | ICD-10-CM | POA: Diagnosis not present

## 2016-11-19 DIAGNOSIS — R102 Pelvic and perineal pain: Secondary | ICD-10-CM | POA: Diagnosis not present

## 2016-11-19 DIAGNOSIS — Z885 Allergy status to narcotic agent status: Secondary | ICD-10-CM | POA: Diagnosis not present

## 2016-11-19 DIAGNOSIS — C7970 Secondary malignant neoplasm of unspecified adrenal gland: Secondary | ICD-10-CM | POA: Diagnosis not present

## 2016-11-19 DIAGNOSIS — Z880 Allergy status to penicillin: Secondary | ICD-10-CM | POA: Diagnosis not present

## 2016-11-19 DIAGNOSIS — C419 Malignant neoplasm of bone and articular cartilage, unspecified: Secondary | ICD-10-CM | POA: Diagnosis not present

## 2016-11-19 DIAGNOSIS — R0602 Shortness of breath: Secondary | ICD-10-CM | POA: Diagnosis not present

## 2016-11-19 DIAGNOSIS — Z888 Allergy status to other drugs, medicaments and biological substances status: Secondary | ICD-10-CM | POA: Diagnosis not present

## 2016-11-19 DIAGNOSIS — Z882 Allergy status to sulfonamides status: Secondary | ICD-10-CM | POA: Diagnosis not present

## 2016-11-19 DIAGNOSIS — Z853 Personal history of malignant neoplasm of breast: Secondary | ICD-10-CM | POA: Diagnosis not present

## 2016-11-19 DIAGNOSIS — R1013 Epigastric pain: Secondary | ICD-10-CM | POA: Diagnosis not present

## 2016-11-19 DIAGNOSIS — C3490 Malignant neoplasm of unspecified part of unspecified bronchus or lung: Secondary | ICD-10-CM | POA: Diagnosis not present

## 2016-11-19 DIAGNOSIS — R079 Chest pain, unspecified: Secondary | ICD-10-CM | POA: Diagnosis not present

## 2016-11-19 DIAGNOSIS — M549 Dorsalgia, unspecified: Secondary | ICD-10-CM | POA: Diagnosis not present

## 2016-11-19 DIAGNOSIS — R1011 Right upper quadrant pain: Secondary | ICD-10-CM | POA: Diagnosis not present

## 2016-11-21 ENCOUNTER — Ambulatory Visit (HOSPITAL_COMMUNITY)
Admission: RE | Admit: 2016-11-21 | Discharge: 2016-11-21 | Disposition: A | Discharge status: Home / Self Care | Payer: Medicare Other | Source: Ambulatory Visit | Attending: Hematology and Oncology | Admitting: Hematology and Oncology

## 2016-11-21 DIAGNOSIS — Z79899 Other long term (current) drug therapy: Secondary | ICD-10-CM | POA: Diagnosis not present

## 2016-11-21 DIAGNOSIS — C7972 Secondary malignant neoplasm of left adrenal gland: Secondary | ICD-10-CM | POA: Insufficient documentation

## 2016-11-21 DIAGNOSIS — R918 Other nonspecific abnormal finding of lung field: Secondary | ICD-10-CM

## 2016-11-21 DIAGNOSIS — Z853 Personal history of malignant neoplasm of breast: Secondary | ICD-10-CM | POA: Insufficient documentation

## 2016-11-21 DIAGNOSIS — Z880 Allergy status to penicillin: Secondary | ICD-10-CM | POA: Diagnosis not present

## 2016-11-21 DIAGNOSIS — C50412 Malignant neoplasm of upper-outer quadrant of left female breast: Secondary | ICD-10-CM

## 2016-11-21 DIAGNOSIS — Z87891 Personal history of nicotine dependence: Secondary | ICD-10-CM | POA: Diagnosis not present

## 2016-11-21 DIAGNOSIS — E279 Disorder of adrenal gland, unspecified: Secondary | ICD-10-CM | POA: Diagnosis not present

## 2016-11-21 DIAGNOSIS — C7492 Malignant neoplasm of unspecified part of left adrenal gland: Secondary | ICD-10-CM | POA: Diagnosis not present

## 2016-11-21 DIAGNOSIS — F419 Anxiety disorder, unspecified: Secondary | ICD-10-CM | POA: Diagnosis not present

## 2016-11-21 LAB — COMPREHENSIVE METABOLIC PANEL
ALK PHOS: 87 U/L (ref 38–126)
ALT: 13 U/L — ABNORMAL LOW (ref 14–54)
ANION GAP: 7 (ref 5–15)
AST: 24 U/L (ref 15–41)
Albumin: 3.4 g/dL — ABNORMAL LOW (ref 3.5–5.0)
BUN: 8 mg/dL (ref 6–20)
CHLORIDE: 107 mmol/L (ref 101–111)
CO2: 25 mmol/L (ref 22–32)
Calcium: 8.8 mg/dL — ABNORMAL LOW (ref 8.9–10.3)
Creatinine, Ser: 0.63 mg/dL (ref 0.44–1.00)
GFR calc non Af Amer: >60 mL/min (ref >60)
Glucose, Bld: 114 mg/dL — ABNORMAL HIGH (ref 65–99)
POTASSIUM: 4.1 mmol/L (ref 3.5–5.1)
SODIUM: 139 mmol/L (ref 135–145)
Total Bilirubin: 1.1 mg/dL (ref 0.3–1.2)
Total Protein: 7 g/dL (ref 6.5–8.1)

## 2016-11-21 LAB — CBC WITH DIFFERENTIAL/PLATELET
Basophils Absolute: 0 10*3/uL (ref 0.0–0.1)
Basophils Relative: 0 %
EOS ABS: 0.1 10*3/uL (ref 0.0–0.7)
EOS PCT: 2 %
HCT: 38.1 % (ref 36.0–46.0)
Hemoglobin: 12.1 g/dL (ref 12.0–15.0)
LYMPHS ABS: 1.4 10*3/uL (ref 0.7–4.0)
Lymphocytes Relative: 20 %
MCH: 29.5 pg (ref 26.0–34.0)
MCHC: 31.8 g/dL (ref 30.0–36.0)
MCV: 92.9 fL (ref 78.0–100.0)
MONOS PCT: 10 %
Monocytes Absolute: 0.7 10*3/uL (ref 0.1–1.0)
Neutro Abs: 4.6 10*3/uL (ref 1.7–7.7)
Neutrophils Relative %: 68 %
PLATELETS: 319 10*3/uL (ref 150–400)
RBC: 4.1 MIL/uL (ref 3.87–5.11)
RDW: 13.5 % (ref 11.5–15.5)
WBC: 6.7 10*3/uL (ref 4.0–10.5)

## 2016-11-21 LAB — PROTIME-INR
INR: 1.15
PROTHROMBIN TIME: 14.8 s (ref 11.4–15.2)

## 2016-11-21 MED ORDER — FENTANYL CITRATE (PF) 100 MCG/2ML IJ SOLN
INTRAMUSCULAR | Status: AC
  Filled 2016-11-21: qty 4

## 2016-11-21 MED ORDER — LIDOCAINE HCL (PF) 1 % IJ SOLN
INTRAMUSCULAR | Status: AC
  Filled 2016-11-21: qty 30

## 2016-11-21 MED ORDER — MIDAZOLAM HCL 2 MG/2ML IJ SOLN
INTRAMUSCULAR | Status: AC | PRN
  Administered 2016-11-21 (×4): 1 mg via INTRAVENOUS

## 2016-11-21 MED ORDER — MIDAZOLAM HCL 2 MG/2ML IJ SOLN
INTRAMUSCULAR | Status: AC
  Filled 2016-11-21: qty 6

## 2016-11-21 MED ORDER — FENTANYL CITRATE (PF) 100 MCG/2ML IJ SOLN
INTRAMUSCULAR | Status: AC | PRN
  Administered 2016-11-21 (×2): 25 ug via INTRAVENOUS
  Administered 2016-11-21: 50 ug via INTRAVENOUS

## 2016-11-21 MED ORDER — SODIUM CHLORIDE 0.9 % IV SOLN
INTRAVENOUS | Status: DC
  Administered 2016-11-21: 11:00:00 via INTRAVENOUS

## 2016-11-21 NOTE — Sedation Documentation (Signed)
Patient denies pain and is resting comfortably.  

## 2016-11-21 NOTE — H&P (Signed)
Chief Complaint: Patient was seen in consultation today for   Referring Physician(s): Gudena,Vinay  Supervising Physician: Marybelle Killings  Patient Status: [facility] - Out-pt  History of Present Illness: Kelly Stuart is a 71 y.o. female with past medical history of anxiety and breast cancer treated with lumpectomy in 2013 with signs of disease in 2017.  She now presents with 4 months of right lower lobe lung mass and associated hilar and mediastinal adenopathy and bilateral adrenal masses.   CT Chest 11/02/16: 1. Spiculated 1.8 cm nodule in the superior segment of the right lower lobe. Right hilar and mediastinal adenopathy. Right hilar soft tissue density causes mass effect Theresia Lo the right lower and middle lobe bronchi, with irregular tubular soft tissue density filling the right lower lobe bronchi. Additional faint ground-glass nodularity in punctate nodules in the right upper lobe. Findings are suspicious for primary bronchogenic malignancy versus metastatic disease given history of breast cancer. 2. 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 3. Lytic lesions involving spinous process of T12, left post eighth rib at the costovertebral junction and possibly left posterior third rib consistent with metastatic disease.  IR consulted for adrenal nodule biopsy.  Case reviewed by Dr. Earleen Newport who approves patient for left adrenal nodule biopsy.   Patient NPO.  She does not take blood thinners.  She presents today in her usual state of health.   Past Medical History:  Diagnosis Date  . Anxiety   . Breast cancer ([place])    ER+PR+ HER-2NEU negative    Past Surgical History:  Procedure Laterality Date  . BREAST LUMPECTOMY WITH NEEDLE LOCALIZATION  02/29/2012   Procedure: BREAST LUMPECTOMY WITH NEEDLE LOCALIZATION;  Surgeon: Rolm Bookbinder, MD;  Location: [facility];  Service: General;  Laterality: Left;  . BREAST SURGERY    . COSMETIC SURGERY    .  DILATION AND CURETTAGE OF UTERUS  1998   submucus myoma-resected  . EYE SURGERY    . FACELIFT W/BLEPHAROPLASTY  1999      . maxillofacial surgery  1980   mouth-ealign teeth-  . minifacelift  2007  . RADIOACTIVE SEED GUIDED EXCISIONAL BREAST BIOPSY N/A 02/11/2016   Procedure: LEFT BREAST RADIOACTIVE SEED GUIDED EXCISIONAL BIOPSY;  Surgeon: Rolm Bookbinder, MD;  Location: [facility];  Service: General;  Laterality: N/A;  . TONSILLECTOMY      Allergies: Codeine; Penicillins; Sulfa antibiotics; and Tetracyclines & related  Medications: Prior to Admission medications   Medication Sig Start Date End Date Taking? Authorizing Provider  amLODipine (NORVASC) 5 MG tablet Take 5 mg by mouth daily. 11/04/16  Yes [provider]  cimetidine (TAGAMET) 200 MG tablet Take 200 mg by mouth daily as needed (acid reflux).   Yes [provider]  escitalopram (LEXAPRO) 10 MG tablet Take 10 mg by mouth daily.  06/24/16  Yes [provider]  ibuprofen (ADVIL,MOTRIN) 200 MG tablet Take 200 mg by mouth 2 (two) times daily as needed for headache or moderate pain.   Yes [provider]     Family History  Problem Relation Age of Onset  . Cancer Mother   . Colon cancer Mother   . Cancer Father   . Colon cancer Father   . Hepatitis Father   . Hiatal hernia Father   . Rheumatic fever Sister   . Colon cancer Paternal Aunt     Social History   Social History  . Marital status: Divorced    Spouse name: N/A  .  Number of children: 1  . Years of education: N/A   Occupational History  .  [employer]iological scientist  .      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 Smoking status: Former Smoker    Packs/day: 2.00    Types: Cigarettes    Quit date: 11/01/1991  . Smokeless tobacco: Never Used  . Alcohol use 3.0 oz/week    5 Standard drinks or equivalent per week     Comment: drinks beer daily  . Drug use: No  .  Sexual activity: Yes    Partners: Male   Other Topics Concern  . Not on file   Social History Narrative  . No narrative on file    Review of Systems  Constitutional: Negative for fatigue and fever.  Respiratory: Negative for cough and shortness of breath.   Cardiovascular: Negative for chest pain.  Gastrointestinal: Negative for abdominal pain.  Musculoskeletal: Positive for back pain (chronic).  Psychiatric/Behavioral: Negative for behavioral problems and confusion.    Vital Signs: BP (!) 144/75   Pulse 83   Temp 98 F (36.7 C)   Resp 18   Ht 5\' 10"  (1.778 m)   Wt 178 lb (80.7 kg)   LMP 08/23/2008   SpO2 99%   BMI 25.54 kg/m   Physical Exam  Constitutional: She is oriented to person, place, and time. She appears well-developed.  Cardiovascular: Normal rate, regular rhythm and normal heart sounds.   Pulmonary/Chest: Effort normal and breath sounds normal. No respiratory distress.  Neurological: She is alert and oriented to person, place, and time.  Skin: Skin is warm and dry.  Psychiatric: She has a normal mood and affect. Her behavior is normal. Judgment and thought content normal.  Nursing note and vitals reviewed.   Mallampati Score:  MD Evaluation Airway: WNL Heart: WNL Chest/ Lungs: WNL ASA  Classification: 3 Mallampati/Airway Score: Two  Imaging: Dg Chest 2 View  Result Date: 10/24/2016 CLINICAL DATA:  70 year old female with cough. History of breast carcinoma EXAM: CHEST  2 VIEW COMPARISON:  None. FINDINGS: Cardiomediastinal silhouette within normal limits. No evidence of central vascular congestion. Nodular density in the right mid lung, may correspond to density at the posterior aspect of the chest on the lateral view overlying the spine. Coarsened interstitial markings bilaterally. No pneumothorax. No pleural effusion. No confluent airspace disease. Surgical clips on the left chest wall. Scoliotic curvature of the thoracic spine. IMPRESSION: Nodular  density in the right mid chest. Given the patient's prior history of breast carcinoma, further evaluation with contrast-enhanced chest CT recommended to rule out mass. Surgical changes of the left chest wall compatible with given history. Electronically Signed   By: Corrie Mckusick D.O.   On: 10/24/2016 15:23   Ct Chest W Contrast  Result Date: 11/02/2016 CLINICAL DATA:  Pulmonary nodule on radiograph. Cough for 4 months. History of breast cancer. EXAM: CT CHEST WITH CONTRAST TECHNIQUE: Multidetector CT imaging of the chest was performed during intravenous contrast administration. CONTRAST:  90mL ISOVUE-300 IOPAMIDOL (ISOVUE-300) INJECTION 61% Creatinine was obtained on site at [facility] at [address] Results: Creatinine 0.6 mg/dL. COMPARISON:  Radiograph 10/24/2016.  No prior chest CT. FINDINGS: Cardiovascular: Mild ectasia of the ascending aorta, maximal dimension 3.7 cm. There is aortic tortuosity. Heart is normal in size. Mediastinum/Nodes: Nodular soft tissue density at the right hilum most consistent with adenopathy, versus central lung lesion. This measures 4.1 x 2.7 cm inferiorly,  2.4 x 2.2 cm superiorly. There is subcarinal adenopathy with nodes measuring 13 mm and 50mm short axis. Additional smaller nodes at multiple mediastinal stations, for example pretracheal node measures 9 mm, upper paratracheal node measures 7 mm. No left hilar adenopathy. There is no axillary adenopathy. Minimal symmetric soft tissue density about the upper internal mammary vessels likely related to sternoclavicular joint capsule. Surgical clips in the left axilla/lateral left breast. Lungs/Pleura: Right perihilar soft tissue density causes mass effect and partial occlusion of the right lower lobe bronchus with bronchial narrowing. Tubular soft tissue density tracks in the lateral right lower lobe filling the lower lobe bronchi. Nodular/ tubular soft tissue density extends to the pleural surface posteriorly.  There is a separate spiculated nodule in the superior segment of the right lower lobe measuring 18 x 18 mm image 59 series 4. Irregular spiculations extend to the pleura posteriorly and inferiorly to the diaphragmatic surface. Right hilar soft tissue density also has mild mass-effect on the right middle lobe bronchus. Small nodular and ground-glass opacities track along the tubuarl densities in the right lower lobe. Ground-glass nodularity in the right upper lobe anteriorly measuring 9 mm image 35 series 4. Additional tiny nodules in the right upper lobe, for example image 40 and 42. No nodules in the left lung. There is no pleural fluid. Upper Abdomen: Heterogeneous right adrenal mass measures 2.3 x 3.4 cm. Heterogeneous left adrenal mass measures 4.0 x 2.8 cm. Small lymph nodes in the retroperitoneum, for example in 10 mm periaortic node on the left. No focal hepatic lesion is seen. Musculoskeletal: Lytic lesion involving the spinous process of T12, does not extend to the spinal canal. Expansile lytic lesion involving left posterior eighth rib at the costovertebral junction. Suspect lytic lesion in left posterior third rib, not well-defined by CT. No evidence of pathologic fracture. IMPRESSION: 1. Spiculated 1.8 cm nodule in the superior segment of the right lower lobe. Right hilar and mediastinal adenopathy. Right hilar soft tissue density causes mass effect Theresia Lo the right lower and middle lobe bronchi, with irregular tubular soft tissue density filling the right lower lobe bronchi. Additional faint ground-glass nodularity in punctate nodules in the right upper lobe. Findings are suspicious for primary bronchogenic malignancy versus metastatic disease given history of breast cancer. 2. 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 3. Lytic lesions involving spinous process of T12, left post eighth rib at the costovertebral junction and possibly left posterior third rib consistent with  metastatic disease. These results will be called to the ordering clinician or representative by the Radiologist Assistant, and communication documented in the PACS or zVision Dashboard. Electronically Signed   By: Jeb Levering M.D.   On: 11/02/2016 17:06    Labs:  CBC:  Recent Labs  02/08/16 1511 11/21/16 1038  WBC 7.0 6.7  HGB 12.8 12.1  HCT 39.8 38.1  PLT 296 319    COAGS: No results for input(s): INR, APTT in the last 8760 hours.  BMP:  Recent Labs  02/08/16 1511  NA 141  K 3.8  CL 106  CO2 28  GLUCOSE 105*  BUN 12  CALCIUM 9.5  CREATININE 0.77  GFRNONAA >60  GFRAA >60    LIVER FUNCTION TESTS: No results for input(s): BILITOT, AST, ALT, ALKPHOS, PROT, ALBUMIN in the last 8760 hours.  TUMOR MARKERS: No results for input(s): AFPTM, CEA, CA199, CHROMGRNA in the last 8760 hours.  Assessment and Plan: Patient with history of breast cancer and evidence of possible metastatic disease  presents with bilateral adrenal nodules.  IR consulted for possible biopsy.  Case reviewed by Dr. Earleen Newport who approves patient for left adrenal gland biopsy.  Patient has been NPO.  She does not take blood thinners.  She presents today in her usual state of health. Risks and Benefits discussed with the patient including, but not limited to bleeding, infection, damage to adjacent structures or low yield requiring additional tests. All of the patient's questions were answered, patient is agreeable to proceed. Consent signed and in chart.  Thank you for this interesting consult.  I greatly enjoyed meeting Gabriela R Eber and look forward to participating in their care.  A copy of this report was sent to the requesting provider on this date.  Electronically Signed: Docia Barrier, PA 11/21/2016, 10:57 AM   I spent a total of  30 Minutes   in face to face in clinical consultation, greater than 50% of which was counseling/coordinating care for left adrenal  nodule.

## 2016-11-21 NOTE — Procedures (Signed)
L adrenal Bx 18 g core EBL 0 Comp 0

## 2016-11-21 NOTE — Discharge Instructions (Signed)
Needle Biopsy, Care After These instructions give you information about caring for yourself after your procedure. Your doctor may also give you more specific instructions. Call your doctor if you have any problems or questions after your procedure. Follow these instructions at home:  Rest as told by your doctor.  Take medicines only as told by your doctor.  There are many different ways to close and cover the biopsy site, including stitches (sutures), skin glue, and adhesive strips. Follow instructions from your doctor about: ? How to take care of your biopsy site. ? When and how you should change your bandage (dressing). ? When you should remove your dressing. ? Removing whatever was used to close your biopsy site.  Check your biopsy site every day for signs of infection. Watch for: ? Redness, swelling, or pain. ? Fluid, blood, or pus. Contact a doctor if:  You have a fever.  You have redness, swelling, or pain at the biopsy site, and it lasts longer than a few days.  You have fluid, blood, or pus coming from the biopsy site.  You feel sick to your stomach (nauseous).  You throw up (vomit). Get help right away if:  You are short of breath.  You have trouble breathing.  Your chest hurts.  You feel dizzy or you pass out (faint).  You have bleeding that does not stop with pressure or a bandage.  You cough up blood.  Your belly (abdomen) hurts. This information is not intended to replace advice given to you by your health care provider. Make sure you discuss any questions you have with your health care provider. Document Released: 03/24/2008 Document Revised: 09/17/2015 Document Reviewed: 04/07/2014 Elsevier Interactive Patient Education  [employer].

## 2016-11-21 NOTE — Sedation Documentation (Signed)
Patient is resting comfortably. 

## 2016-11-23 ENCOUNTER — Ambulatory Visit (HOSPITAL_COMMUNITY)
Admission: RE | Admit: 2016-11-23 | Discharge: 2016-11-23 | Disposition: A | Discharge status: Home / Self Care | Payer: Medicare Other | Source: Ambulatory Visit | Attending: Hematology and Oncology | Admitting: Hematology and Oncology

## 2016-11-23 DIAGNOSIS — Z79899 Other long term (current) drug therapy: Secondary | ICD-10-CM | POA: Diagnosis not present

## 2016-11-23 DIAGNOSIS — C7972 Secondary malignant neoplasm of left adrenal gland: Secondary | ICD-10-CM | POA: Insufficient documentation

## 2016-11-23 DIAGNOSIS — R918 Other nonspecific abnormal finding of lung field: Secondary | ICD-10-CM | POA: Diagnosis not present

## 2016-11-23 DIAGNOSIS — C778 Secondary and unspecified malignant neoplasm of lymph nodes of multiple regions: Secondary | ICD-10-CM | POA: Insufficient documentation

## 2016-11-23 DIAGNOSIS — M8448XA Pathological fracture, other site, initial encounter for fracture: Secondary | ICD-10-CM | POA: Insufficient documentation

## 2016-11-23 DIAGNOSIS — R911 Solitary pulmonary nodule: Secondary | ICD-10-CM | POA: Diagnosis not present

## 2016-11-23 DIAGNOSIS — C7989 Secondary malignant neoplasm of other specified sites: Secondary | ICD-10-CM | POA: Diagnosis not present

## 2016-11-23 DIAGNOSIS — C7971 Secondary malignant neoplasm of right adrenal gland: Secondary | ICD-10-CM | POA: Diagnosis not present

## 2016-11-23 DIAGNOSIS — C50412 Malignant neoplasm of upper-outer quadrant of left female breast: Secondary | ICD-10-CM | POA: Insufficient documentation

## 2016-11-23 LAB — GLUCOSE, CAPILLARY: GLUCOSE-CAPILLARY: 108 mg/dL — AB (ref 65–99)

## 2016-11-23 MED ORDER — FLUDEOXYGLUCOSE F - 18 (FDG) INJECTION
8.8000 | Freq: Once | INTRAVENOUS | Status: AC | PRN
  Administered 2016-11-23: 8.8 via INTRAVENOUS

## 2016-11-24 ENCOUNTER — Telehealth: Payer: Self-pay

## 2016-11-24 NOTE — Telephone Encounter (Signed)
Called Kelly Stuart to let her know that Dr.Gudena would like to see her in the office to discuss her pathology results. Kelly Stuart scheduled for Monday 11/28/16 at 10am. Kelly Stuart states that she is currently being treated for shingles and will need a few days for her medication to kick in. Notified Dr.Gudena regarding Kelly Stuart shingles and is aware. Kelly Stuart will be here on Monday.

## 2016-11-28 ENCOUNTER — Telehealth: Payer: Self-pay

## 2016-11-28 ENCOUNTER — Encounter: Payer: Self-pay | Admitting: Hematology and Oncology

## 2016-11-28 ENCOUNTER — Ambulatory Visit (HOSPITAL_BASED_OUTPATIENT_CLINIC_OR_DEPARTMENT_OTHER): Payer: Medicare Other | Admitting: Hematology and Oncology

## 2016-11-28 VITALS — BP 161/85 | HR 91 | Temp 98.1°F | Resp 18 | Ht 70.0 in | Wt 172.2 lb

## 2016-11-28 DIAGNOSIS — C7972 Secondary malignant neoplasm of left adrenal gland: Secondary | ICD-10-CM | POA: Diagnosis not present

## 2016-11-28 DIAGNOSIS — C50412 Malignant neoplasm of upper-outer quadrant of left female breast: Secondary | ICD-10-CM | POA: Diagnosis not present

## 2016-11-28 DIAGNOSIS — C3490 Malignant neoplasm of unspecified part of unspecified bronchus or lung: Secondary | ICD-10-CM | POA: Diagnosis not present

## 2016-11-28 NOTE — Progress Notes (Signed)
Patient Care Team: Jonathon Jordan, MD as PCP - General (Family Medicine)  DIAGNOSIS:  Encounter Diagnoses  Name Primary?  . Primary cancer of upper outer quadrant of left female breast ([place]) Yes  . 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      05/18/2011 - 03/12/2012 Anti-estrogen oral therapy    Neoadjuvant Arimidex 1 mg daily      02/29/2012 Surgery    Left breast lumpectomy without sentinel lymph node biopsy (patient declined), 1.1 cm well-differentiated IDC ER/PR positive HER-2 negative T1 cN0 M0 stage IA       Radiation Therapy    Declined radiation therapy, also declined antiestrogen therapy      02/11/2016 Surgery    Left Lumpectomy: 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       CHIEF COMPLIANT: follow-up to discuss the adrenal biopsy and PET CT scan  INTERVAL HISTORY: Kelly Stuart is a 71 year old with above-mentioned history of lung nodules along with the hilar and mediastinal nodes and on and adrenal metastases underwent adrenal gland biopsy and is here today to discuss the pathology report. She also had shingles couple of days after the biopsy. The shingles was quite painful throughout her body. The shingles was primarily on the abdominal wall. She has recovered from all of that. She  is here today accompanied by her family to discuss a PET/CT scan and a pathology report on the adrenal biopsy  REVIEW OF SYSTEMS:   Constitutional: Denies fevers, chills or abnormal weight loss Eyes: Denies blurriness of vision Ears, nose, mouth, throat, and face: Denies mucositis or sore throat Respiratory: Denies cough, dyspnea or wheezes Cardiovascular: Denies palpitation, chest discomfort Gastrointestinal:  Denies nausea, heartburn or change in bowel habits Skin: shingles resolved, small subcutaneous nodules left shoulder as well as the left to infrascapular area Lymphatics: Denies new lymphadenopathy or easy bruising Neurological:Denies numbness, tingling or new weaknesses Behavioral/Psych: Mood is stable, no new changes  Extremities: No lower extremity edema All other systems were reviewed with the patient and are negative.  I have reviewed the past medical history, past surgical history, social history and family history with the patient and they are unchanged from previous note.  ALLERGIES:  is allergic to codeine; penicillins; sulfa antibiotics; and tetracyclines & related.  MEDICATIONS:  Current Outpatient Prescriptions  Medication Sig Dispense Refill  . amLODipine (NORVASC) 5 MG tablet Take 5 mg by mouth daily.    . cimetidine (TAGAMET) 200 MG tablet Take 200 mg by mouth daily as needed (acid reflux).    Marland Kitchen escitalopram (LEXAPRO) 10 MG tablet Take 10 mg by mouth daily.     Marland Kitchen ibuprofen (ADVIL,MOTRIN) 200 MG tablet Take 200 mg by mouth 2 (two) times daily as needed for headache or moderate pain.     No current facility-administered medications for this visit.  PHYSICAL EXAMINATION: ECOG PERFORMANCE STATUS: 1 - Symptomatic but completely ambulatory  Vitals:   11/28/16 1024  BP: (!) 161/85  Pulse: 91  Resp: 18  Temp: 98.1 F (36.7 C)   Filed Weights   11/28/16 1024  Weight: 172 lb 3.2 oz (78.1 kg)    GENERAL:alert, no distress and comfortable SKIN: small  subcutaneous nodules underneath the scapula and behind the shoulder EYES: normal, Conjunctiva are pink and non-injected, sclera clear OROPHARYNX:no exudate, no erythema and lips, buccal mucosa, and tongue normal  NECK: supple, thyroid normal size, non-tender, without nodularity LYMPH:  no palpable lymphadenopathy in the cervical, axillary or inguinal LUNGS: clear to auscultation and percussion with normal breathing effort HEART: regular rate & rhythm and no murmurs and no lower extremity edema ABDOMEN:abdomen soft, non-tender and normal bowel sounds MUSCULOSKELETAL:no cyanosis of digits and no clubbing  NEURO: alert & oriented x 3 with fluent speech, no focal motor/sensory deficits EXTREMITIES: No lower extremity edema  LABORATORY DATA:  I have reviewed the data as listed   Chemistry      Component Value Date/Time   NA 139 11/21/2016 1038   NA 141 02/03/2014 1259   K 4.1 11/21/2016 1038   K 4.2 02/03/2014 1259   CL 107 11/21/2016 1038   CL 106 06/12/2012 1411   CO2 25 11/21/2016 1038   CO2 25 02/03/2014 1259   BUN 8 11/21/2016 1038   BUN 13.1 02/03/2014 1259   CREATININE 0.63 11/21/2016 1038   CREATININE 0.8 02/03/2014 1259      Component Value Date/Time   CALCIUM 8.8 (L) 11/21/2016 1038   CALCIUM 9.6 02/03/2014 1259   ALKPHOS 87 11/21/2016 1038   ALKPHOS 56 02/03/2014 1259   AST 24 11/21/2016 1038   AST 17 02/03/2014 1259   ALT 13 (L) 11/21/2016 1038   ALT 17 02/03/2014 1259   BILITOT 1.1 11/21/2016 1038   BILITOT 0.59 02/03/2014 1259       Lab Results  Component Value Date   WBC 6.7 11/21/2016   HGB 12.1 11/21/2016   HCT 38.1 11/21/2016   MCV 92.9 11/21/2016   PLT 319 11/21/2016   NEUTROABS 4.6 11/21/2016    ASSESSMENT & PLAN:  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 She declined radiation and  adjuvant antiestrogen therapies. 02/11/2016: Left lumpectomy: LCIS  Metastatic lung cancer (metastasis from lung to other site), unspecified laterality ([place]) CT chest 11/02/2016 done for chronic cough for 4 months Right lower lobe lung spiculated mass 1.8 cm with right hilar and mediastinal adenopathy, right hilar soft tissue density causes mass effect, soft tissue density filling the right lower lobe bronchi, groundglass nodularity right upper lobe, 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  Left adrenal gland biopsy 11/21/2016: Consistent with metastatic lung adenocarcinoma TTF-1 positive, negative for GATA-3, CD56, CK5/6, and ER.  Plan: 1. Will obtain brain MRI 2. Get additional tissue for molecular diagnostic tests including EGFR Ross ALK and PD1 Depending on these results patient treatment will be determined. 3. For the pain in the posterior left eighth rib as well as T 12 area, I will consult radiation oncology.  I spent 25 minutes talking to the patient of which more than half was spent in counseling and coordination of care.  Orders Placed This Encounter  Procedures  . MR  Brain W Wo Contrast    Standing Status:   Future    Standing Expiration Date:   11/28/2017    Order Specific Question:   If indicated for the ordered procedure, I authorize the administration of contrast media per Radiology protocol    Answer:   Yes    Order Specific Question:   What is the patient's sedation requirement?    Answer:   No Sedation    Order Specific Question:   Does the patient have a pacemaker or implanted devices?    Answer:   No    Order Specific Question:   Radiology Contrast Protocol - do NOT remove file path    Answer:   _0 [email or web address]    Order Specific Question:   Preferred imaging location?    Answer:   [facility] (table limit-350 lbs)  . [place] Core Biopsy     Please send pathology for molecular diagnostic testing for [facility]    Standing Status:   Future    Standing Expiration Date:   01/28/2018    Order Specific Question:   Reason for Exam (SYMPTOM  OR DIAGNOSIS REQUIRED)    Answer:   Ultrasound-guided biopsy of left lateral body wall subcutaneous nodule    Comments:   For molecular diagnostic test to [facility]; discussed with Dr.Watts    Order Specific Question:   Preferred imaging location?    Answer:   [facility]  . Ambulatory referral to Radiation Oncology    Referral Priority:   Routine    Referral Type:   Consultation    Referral Reason:   Specialty Services Required    Requested Specialty:   Radiation Oncology    Number of Visits Requested:   1   The patient has a good understanding of the overall plan. she agrees with it. she will call with any problems that may develop before the next visit here.   Rulon Eisenmenger, MD 11/28/16

## 2016-11-28 NOTE — Telephone Encounter (Signed)
Called pt to let her know of her MRI Brain appt on Saturday 12/03/16 at 10 am [facility]. Pt confirmed this time/date. Gave pt scheduling number should anything come up. Pt appreciative of call.

## 2016-11-28 NOTE — Assessment & Plan Note (Signed)
CT chest 11/02/2016 done for chronic cough for 4 months Right lower lobe lung spiculated mass 1.8 cm with right hilar and mediastinal adenopathy, right hilar soft tissue density causes mass effect, soft tissue density filling the right lower lobe bronchi, groundglass nodularity right upper lobe, 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  Left adrenal gland biopsy 11/21/2016: Consistent with metastatic lung adenocarcinoma TTF-1 positive, negative for GATA-3, CD56, CK5/6, and ER.  Plan: 1. Will send blood for EGFR and ALK mutations 2. if these tests are negative, we do have to rebiopsy for PD-1 testing or proceed with immunotherapy using Pembrolizumab.

## 2016-11-28 NOTE — Assessment & Plan Note (Signed)
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 She declined radiation and adjuvant antiestrogen therapies. 02/11/2016: Left lumpectomy: LCIS

## 2016-11-30 ENCOUNTER — Encounter: Payer: Self-pay | Admitting: Radiation Oncology

## 2016-12-03 ENCOUNTER — Ambulatory Visit (HOSPITAL_COMMUNITY)
Admission: RE | Admit: 2016-12-03 | Discharge: 2016-12-03 | Disposition: A | Discharge status: Home / Self Care | Payer: Medicare Other | Source: Ambulatory Visit | Attending: Hematology and Oncology | Admitting: Hematology and Oncology

## 2016-12-03 DIAGNOSIS — C3490 Malignant neoplasm of unspecified part of unspecified bronchus or lung: Secondary | ICD-10-CM | POA: Diagnosis not present

## 2016-12-03 DIAGNOSIS — C7931 Secondary malignant neoplasm of brain: Secondary | ICD-10-CM | POA: Insufficient documentation

## 2016-12-03 MED ORDER — GADOBENATE DIMEGLUMINE 529 MG/ML IV SOLN: 15.0000 mL | Freq: Once | INTRAVENOUS | Status: DC | PRN

## 2016-12-06 ENCOUNTER — Other Ambulatory Visit: Payer: Self-pay | Admitting: Radiology

## 2016-12-07 ENCOUNTER — Encounter (HOSPITAL_COMMUNITY): Payer: Self-pay

## 2016-12-07 ENCOUNTER — Ambulatory Visit (HOSPITAL_COMMUNITY)
Admission: RE | Admit: 2016-12-07 | Discharge: 2016-12-07 | Disposition: A | Discharge status: Home / Self Care | Payer: Medicare Other | Source: Ambulatory Visit | Attending: Hematology and Oncology | Admitting: Hematology and Oncology

## 2016-12-07 DIAGNOSIS — Z79899 Other long term (current) drug therapy: Secondary | ICD-10-CM | POA: Diagnosis not present

## 2016-12-07 DIAGNOSIS — Z87891 Personal history of nicotine dependence: Secondary | ICD-10-CM | POA: Insufficient documentation

## 2016-12-07 DIAGNOSIS — C3490 Malignant neoplasm of unspecified part of unspecified bronchus or lung: Secondary | ICD-10-CM | POA: Insufficient documentation

## 2016-12-07 DIAGNOSIS — F419 Anxiety disorder, unspecified: Secondary | ICD-10-CM | POA: Insufficient documentation

## 2016-12-07 DIAGNOSIS — C7989 Secondary malignant neoplasm of other specified sites: Secondary | ICD-10-CM | POA: Diagnosis not present

## 2016-12-07 DIAGNOSIS — R222 Localized swelling, mass and lump, trunk: Secondary | ICD-10-CM | POA: Diagnosis not present

## 2016-12-07 DIAGNOSIS — Z880 Allergy status to penicillin: Secondary | ICD-10-CM | POA: Insufficient documentation

## 2016-12-07 DIAGNOSIS — C493 Malignant neoplasm of connective and soft tissue of thorax: Secondary | ICD-10-CM | POA: Diagnosis not present

## 2016-12-07 LAB — CBC WITH DIFFERENTIAL/PLATELET
BASOS ABS: 0 10*3/uL (ref 0.0–0.1)
BASOS PCT: 0 %
EOS PCT: 3 %
Eosinophils Absolute: 0.2 10*3/uL (ref 0.0–0.7)
HEMATOCRIT: 38.9 % (ref 36.0–46.0)
Hemoglobin: 12.7 g/dL (ref 12.0–15.0)
LYMPHS PCT: 32 %
Lymphs Abs: 2.4 10*3/uL (ref 0.7–4.0)
MCH: 29.5 pg (ref 26.0–34.0)
MCHC: 32.6 g/dL (ref 30.0–36.0)
MCV: 90.3 fL (ref 78.0–100.0)
MONO ABS: 0.5 10*3/uL (ref 0.1–1.0)
Monocytes Relative: 7 %
NEUTROS ABS: 4.2 10*3/uL (ref 1.7–7.7)
Neutrophils Relative %: 58 %
Platelets: 432 10*3/uL — ABNORMAL HIGH (ref 150–400)
RBC: 4.31 MIL/uL (ref 3.87–5.11)
RDW: 13.8 % (ref 11.5–15.5)
WBC: 7.4 10*3/uL (ref 4.0–10.5)

## 2016-12-07 MED ORDER — LIDOCAINE HCL (PF) 1 % IJ SOLN
INTRAMUSCULAR | Status: AC
  Filled 2016-12-07: qty 30

## 2016-12-07 MED ORDER — SODIUM CHLORIDE 0.9 % IV SOLN
INTRAVENOUS | Status: DC
  Administered 2016-12-07: 12:00:00 via INTRAVENOUS

## 2016-12-07 MED ORDER — MIDAZOLAM HCL 2 MG/2ML IJ SOLN
INTRAMUSCULAR | Status: AC
  Filled 2016-12-07: qty 2

## 2016-12-07 NOTE — Sedation Documentation (Signed)
Patient spoke with Doctor at time of time out decided not to have Versed.

## 2016-12-07 NOTE — H&P (Signed)
Chief Complaint: Patient was seen in consultation today for biopsy of chest wall nodule at the request of [place]  Referring Physician(s): [place]  Supervising Physician: Corrie Mckusick  Patient Status: [facility] - Out-pt  History of Present Illness: Kelly Stuart is a 71 y.o. female with biopsy proven metastatic lung cancer after an adrenal mass biopsy a couple weeks ago. She has since had PET scan which shows additional lesions. Her original biopsy specimen did not have enough tissue to do molecular testing, therefore she is scheduled today for biopsy of a hypermetabolic (L)chest wall nodule. PMHx, meds, labs reviewed and updated. She tolerated the previous biopsy/sedation well. Has been NPO today Family at bedside.  Past Medical History:  Diagnosis Date  . Anxiety   . Breast cancer ([place])    ER+PR+ HER-2NEU negative    Past Surgical History:  Procedure Laterality Date  . BREAST LUMPECTOMY WITH NEEDLE LOCALIZATION  02/29/2012   Procedure: BREAST LUMPECTOMY WITH NEEDLE LOCALIZATION;  Surgeon: Rolm Bookbinder, MD;  Location: [place];  Service: General;  Laterality: Left;  . BREAST SURGERY    . COSMETIC SURGERY    . DILATION AND CURETTAGE OF UTERUS  1998   submucus myoma-resected  . EYE SURGERY    . FACELIFT W/BLEPHAROPLASTY  1999      . maxillofacial surgery  1980   mouth-ealign teeth-  . minifacelift  2007  . RADIOACTIVE SEED GUIDED EXCISIONAL BREAST BIOPSY N/A 02/11/2016   Procedure: LEFT BREAST RADIOACTIVE SEED GUIDED EXCISIONAL BIOPSY;  Surgeon: Rolm Bookbinder, MD;  Location: [place];  Service: General;  Laterality: N/A;  . TONSILLECTOMY      Allergies: Codeine; Penicillins; Sulfa antibiotics; and Tetracyclines & related  Medications: Prior to Admission medications   Medication Sig Start Date End Date Taking? Authorizing Provider  amLODipine (NORVASC) 5 MG tablet Take 5 mg by mouth daily. 11/04/16  Yes [provider]  cimetidine (TAGAMET) 200 MG tablet Take 200 mg by mouth daily as needed (acid reflux).   Yes [provider]  escitalopram (LEXAPRO) 10 MG tablet Take 10 mg by mouth daily.  06/24/16  Yes [provider]  ibuprofen (ADVIL,MOTRIN) 200 MG tablet Take 200 mg by mouth 2 (two) times daily as needed for headache or moderate pain.   Yes [provider]     Family History  Problem Relation Age of Onset  . Cancer Mother   . Colon cancer Mother   . Cancer Father   . Colon cancer Father   . Hepatitis Father   . Hiatal hernia Father   . Rheumatic fever Sister   . Colon cancer Paternal Aunt     Social History   Social History  . Marital status: Divorced    Spouse name: N/A  . Number of children: 1  . Years of education: N/A   Occupational History  .  [employer]iological scientist  .      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 Smoking status: Former Smoker    Packs/day: 2.00    Types: Cigarettes    Quit date: 11/01/1991  . Smokeless tobacco: Never Used  . Alcohol use 3.0 oz/week    5 Standard drinks or equivalent per week     Comment: drinks beer daily  . Drug use: No  . Sexual activity: Yes    Partners: Male   Other Topics Concern  . None   Social History  Narrative  . None     Review of Systems: A 12 point ROS discussed and pertinent positives are indicated in the HPI above.  All other systems are negative.  Review of Systems  Vital Signs: BP 133/73 (BP Location: Right Arm)   Pulse 89   Temp 97.8 F (36.6 C) (Oral)   Ht 5\' 10"  (1.778 m)   Wt 172 lb (78 kg)   LMP 08/23/2008   SpO2 98%   BMI 24.68 kg/m   Physical Exam  Constitutional: She is oriented to person, place, and time. She appears well-developed and well-nourished. No distress.  HENT:  Head: Normocephalic.  Mouth/Throat: Oropharynx is clear and moist.  Neck: Normal range of motion. No JVD present. No tracheal deviation  present.  Cardiovascular: Normal rate, regular rhythm and normal heart sounds.   Pulmonary/Chest: Effort normal and breath sounds normal. No respiratory distress.  Neurological: She is alert and oriented to person, place, and time.  Psychiatric: She has a normal mood and affect. Judgment normal.    Mallampati Score:  MD Evaluation Airway: WNL Heart: WNL Abdomen: WNL Chest/ Lungs: WNL ASA  Classification: 2 Mallampati/Airway Score: One  Imaging: Mr Jeri Cos RX Contrast  Result Date: 12/03/2016 CLINICAL DATA:  Metastatic lung cancer. History of breast cancer 2013 EXAM: MRI HEAD WITHOUT AND WITH CONTRAST TECHNIQUE: Multiplanar, multiecho pulse sequences of the brain and surrounding structures were obtained without and with intravenous contrast. CONTRAST:  15 mL MultiHance IV COMPARISON:  None. FINDINGS: Brain: Right frontal parietal enhancing mass lesion over the convexity near the motor strip. Lesion shows internal hemorrhage. Enhancing component measures 24 x 20 mm with mild surrounding edema. 6.5 mm enhancing nodule left medial parietal lobe with mild edema consistent with metastatic disease. No other enhancing lesions. Ventricle size is normal. No shift of the midline structures. Negative for acute infarct. Mild chronic ischemic change in the white matter. Vascular: Normal arterial flow voids Skull and upper cervical spine: Negative Sinuses/Orbits: Negative Other: None IMPRESSION: 24 x 20 mm hemorrhagic metastatic deposit in the right frontal parietal lobe over the convexity near the motor strip 6.5 mm enhancing metastatic deposit left medial parietal lobe. Electronically Signed   By: Franchot Gallo M.D.   On: 12/03/2016 14:44   Nm Pet Image Initial (pi) Skull Base To Thigh  Result Date: 11/24/2016 CLINICAL DATA:  Initial treatment strategy for lung nodule. History of breast carcinoma. EXAM: NUCLEAR MEDICINE PET SKULL BASE TO THIGH TECHNIQUE: 8.8 mCi F-18 FDG was injected intravenously.  Full-ring PET imaging was performed from the skull base to thigh after the radiotracer. CT data was obtained and used for attenuation correction and anatomic localization. FASTING BLOOD GLUCOSE:  Value: 103 mg/dl COMPARISON:  11/02/2016 FINDINGS: NECK Small hypermetabolic RIGHT level IV lymph node with SUV max equal 8.7 superficial to the strap muscles. CHEST Hypermetabolic 18 mm lung nodule in superior segment of the RIGHT lower lobe with SUV max equal 3.7. There is more intense hypermetabolic mass in the inferior RIGHT hilum with SUV max equal 12. Hypermetabolic paratracheal adenopathy and subcarinal adenopathy. Within the soft tissues of the LEFT lateral chest wall, hypermetabolic nodule just superficial to the chest wall musculature measuring 13 mm (image 80, series 4) with SUV max equal 15 ABDOMEN/PELVIS Bilateral enlarged hypermetabolic adrenal glands. The LEFT adrenal gland measures up to 5 cm SUV max equal 13. Hypermetabolic retrocrural lymph node noted. Multiple small hypermetabolic lymph nodes in the retroperitoneum along the aorta. SKELETON There is a fracture of the posterior  LEFT eighth rib with associated soft tissue component extending into the transverse process of the vertebral body at that level and into the pleural space with SUV max equal 13. Soft tissue component measures approximately 2.5 by 2.2 cm (image 71, series 4) Additional muscle metastasis within the LEFT shoulder musculature (image 45 of the fused data set) as well as the anterior LEFT thigh musculature (image 211 of the fused data set). IMPRESSION: 1. Hypermetabolic RIGHT lower lobe nodule consistent with bronchogenic carcinoma versus metastatic lesion. Favor lung carcinoma. 2. Hypermetabolic mass in the RIGHT hilum as well as hypermetabolic mediastinal nodal metastasis. 3. Bilateral enlarged intensely hypermetabolic adrenal metastasis. 4. Pathologic fracture of the LEFT eighth rib adjacent to the vertebral body with soft tissue  expansion. 5. Soft tissue metastasis superficial to the LEFT lateral chest musculature. Additional muscle metastasis in the LEFT shoulder and LEFT thigh. 6. Small neck nodal metastasis to the RIGHT level IV nodal station. Electronically Signed   By: Suzy Bouchard M.D.   On: 11/24/2016 08:30   Ct Biopsy  Result Date: 11/21/2016 INDICATION: Lung mass.  Bilateral adrenal masses. EXAM: CT BIOPSY MEDICATIONS: None. ANESTHESIA/SEDATION: Fentanyl 100 mcg IV; Versed 4 mg IV Moderate Sedation Time:  13 The patient was continuously monitored during the procedure by the interventional radiology nurse under my direct supervision. FLUOROSCOPY TIME:  None COMPLICATIONS: None immediate. PROCEDURE: Informed written consent was obtained from the patient after a thorough discussion of the procedural risks, benefits and alternatives. All questions were addressed. Maximal Sterile Barrier Technique was utilized including caps, mask, sterile gowns, sterile gloves, sterile drape, hand hygiene and skin antiseptic. A timeout was performed prior to the initiation of the procedure. Under CT guidance, a(n) 17 gauge guide needle was advanced into the left adrenal mass. Subsequently 4 18 gauge core biopsies were obtained. The guide needle was removed. Post biopsy images demonstrate no hemorrhage or pneumothorax. Patient tolerated the procedure well without complication. Vital sign monitoring by nursing staff during the procedure will continue as patient is in the special procedures unit for post procedure observation. FINDINGS: The images document guide needle placement within the left adrenal mass. Post biopsy images demonstrate no hemorrhage or pneumothorax. IMPRESSION: Successful CT-guided left adrenal mass core biopsy. Electronically Signed   By: Marybelle Killings M.D.   On: 11/21/2016 13:36    Labs:  CBC:  Recent Labs  02/08/16 1511 11/21/16 1038 12/07/16 1149  WBC 7.0 6.7 7.4  HGB 12.8 12.1 12.7  HCT 39.8 38.1 38.9  PLT  296 319 432*    COAGS:  Recent Labs  11/21/16 1038  INR 1.15    BMP:  Recent Labs  02/08/16 1511 11/21/16 1038  NA 141 139  K 3.8 4.1  CL 106 107  CO2 28 25  GLUCOSE 105* 114*  BUN 12 8  CALCIUM 9.5 8.8*  CREATININE 0.77 0.63  GFRNONAA >60 >60  GFRAA >60 >60    LIVER FUNCTION TESTS:  Recent Labs  11/21/16 1038  BILITOT 1.1  AST 24  ALT 13*  ALKPHOS 87  PROT 7.0  ALBUMIN 3.4*    TUMOR MARKERS: No results for input(s): AFPTM, CEA, CA199, CHROMGRNA in the last 8760 hours.  Assessment and Plan: Metastatic lung cancer Plan for US guided biopsy of left chest wall nodule for molecular studies Labs reviewed. Risks and benefits discussed with the patient including, but not limited to bleeding, infection, damage to adjacent structures or low yield requiring additional tests. All of the patient's questions were answered,  patient is agreeable to proceed. Consent signed and in chart.    Thank you for this interesting consult.  I greatly enjoyed meeting Shayana R [place] and look forward to participating in their care.  A copy of this report was sent to the requesting provider on this date.  Electronically Signed: Ascencion Dike, PA-C 12/07/2016, 12:21 PM   I spent a total of 20 minutes in face to face in clinical consultation, greater than 50% of which was counseling/coordinating care for left chest wall biopsy

## 2016-12-07 NOTE — Sedation Documentation (Signed)
Patient is resting comfortably. 

## 2016-12-07 NOTE — Progress Notes (Signed)
Patient discharged from IR with family member drank coffee and given crackers. Ambulatory steady gait.

## 2016-12-07 NOTE — Sedation Documentation (Signed)
Patient spoke with Doctor requested no narcotics for procedure. Stated have versed for procedure.

## 2016-12-07 NOTE — Sedation Documentation (Signed)
Patient denies pain and is resting comfortably.  

## 2016-12-07 NOTE — Procedures (Signed)
Interventional Radiology Procedure Note  Procedure: US guided left chest wall mass.  .  Complications: None Recommendations:  - Ok to shower tomorrow - Do not submerge for 7 days - Routine wound care   Signed,  Dulcy Fanny. Earleen Newport, DO

## 2016-12-12 ENCOUNTER — Ambulatory Visit
Admission: RE | Admit: 2016-12-12 | Discharge: 2016-12-12 | Disposition: A | Discharge status: Home / Self Care | Payer: Medicare Other | Source: Ambulatory Visit | Attending: Radiation Oncology | Admitting: Radiation Oncology

## 2016-12-12 ENCOUNTER — Encounter: Payer: Self-pay | Admitting: Radiation Oncology

## 2016-12-12 ENCOUNTER — Other Ambulatory Visit: Payer: Self-pay | Admitting: Radiation Therapy

## 2016-12-12 DIAGNOSIS — M546 Pain in thoracic spine: Secondary | ICD-10-CM | POA: Insufficient documentation

## 2016-12-12 DIAGNOSIS — Z87891 Personal history of nicotine dependence: Secondary | ICD-10-CM | POA: Insufficient documentation

## 2016-12-12 DIAGNOSIS — Z510 Encounter for antineoplastic radiation therapy: Secondary | ICD-10-CM | POA: Insufficient documentation

## 2016-12-12 DIAGNOSIS — C7951 Secondary malignant neoplasm of bone: Secondary | ICD-10-CM | POA: Diagnosis not present

## 2016-12-12 DIAGNOSIS — Z853 Personal history of malignant neoplasm of breast: Secondary | ICD-10-CM | POA: Diagnosis not present

## 2016-12-12 DIAGNOSIS — C3431 Malignant neoplasm of lower lobe, right bronchus or lung: Secondary | ICD-10-CM | POA: Diagnosis not present

## 2016-12-12 DIAGNOSIS — Z800 Family history of malignant neoplasm of digestive organs: Secondary | ICD-10-CM | POA: Insufficient documentation

## 2016-12-12 DIAGNOSIS — Z885 Allergy status to narcotic agent status: Secondary | ICD-10-CM | POA: Insufficient documentation

## 2016-12-12 DIAGNOSIS — Z881 Allergy status to other antibiotic agents status: Secondary | ICD-10-CM | POA: Diagnosis not present

## 2016-12-12 DIAGNOSIS — Z882 Allergy status to sulfonamides status: Secondary | ICD-10-CM | POA: Insufficient documentation

## 2016-12-12 DIAGNOSIS — C3490 Malignant neoplasm of unspecified part of unspecified bronchus or lung: Secondary | ICD-10-CM | POA: Insufficient documentation

## 2016-12-12 DIAGNOSIS — C7931 Secondary malignant neoplasm of brain: Secondary | ICD-10-CM

## 2016-12-12 DIAGNOSIS — Z79899 Other long term (current) drug therapy: Secondary | ICD-10-CM | POA: Insufficient documentation

## 2016-12-12 DIAGNOSIS — C7949 Secondary malignant neoplasm of other parts of nervous system: Principal | ICD-10-CM

## 2016-12-12 DIAGNOSIS — Z880 Allergy status to penicillin: Secondary | ICD-10-CM | POA: Insufficient documentation

## 2016-12-12 DIAGNOSIS — F419 Anxiety disorder, unspecified: Secondary | ICD-10-CM | POA: Diagnosis not present

## 2016-12-12 NOTE — Progress Notes (Signed)
Please see the Nurse Progress Note in the MD Initial Consult Encounter for this patient. 

## 2016-12-12 NOTE — Progress Notes (Signed)
Histology and Location of Primary Cancer:  Lung Cancer  Diagnosis 12/07/2016: Soft Tissue Needle Core Biopsy, Chest wall NON SMALL CELL POORLY DIFFERENTIATED ADENOCARCINOMA  This was a biopsy of her left lateral shoulder area.    11/21/16 Diagnosis Adrenal gland, biopsy, Left - CARCINOMA, CONSISTENT WITH METASTATIC LUNG ADENOCARCINOMA  Sites of Visceral and Bony Metastatic Disease:  CT Chest performed 11/02/16 demonstrated:  Right lower lobe lung spiculated mass 1.8 cm with right hilar and mediastinal adenopathy, right hilar soft tissue density causes mass effect, soft tissue density filling the right lower lobe bronchi, groundglass nodularity right upper lobe, bilateral adrenal masses consistent with metastatic disease, lytic lesions T12 left posterior eighth rib and left posterior third rib  Location(s) of Symptomatic Metastases: Posterior left eighth rib and T 12 area   Past/Anticipated chemotherapy by medical oncology, if any:  11/28/16 Dr. Lindi Adie documents: Plan: 1. Will obtain brain MRI (done 12/03/16)   IMPRESSION: 24 x 20 mm hemorrhagic metastatic deposit in the right frontal parietal lobe over the convexity near the motor strip  6.5 mm enhancing metastatic deposit left medial parietal lobe  2. Get additional tissue for molecular diagnostic tests including EGFR Ross ALK and PD1 Depending on these results patient treatment will be determined. 3. For the pain in the posterior left eighth rib as well as T 12 area, I will consult radiation oncology   Pain on a scale of 0-10 is: 1/10 to her Left abdomen from shingles.    If Spine Met(s), symptoms, if any, include:  Bowel/Bladder retention or incontinence (please describe): She denies.   Numbness or weakness in extremities (please describe): She has weakness in her Left arm. She is not sure if it is from shingles or lesion.   Current Decadron regimen, if applicable: N/A  Ambulatory status? Walker? Wheelchair?:  Ambulatory  SAFETY ISSUES:  Prior radiation? No  Pacemaker/ICD? No  Possible current pregnancy? No  Is the patient on methotrexate? No  Current Complaints / other details:   12/03/16 Brain MRI revealed: IMPRESSION: 24 x 20 mm hemorrhagic metastatic deposit in the right frontal parietal lobe over the convexity near the motor strip  6.5 mm enhancing metastatic deposit left medial parietal lobe.   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 She declined radiation and adjuvant antiestrogen therapies. 02/11/2016: Left lumpectomy: LCIS  BP (!) 142/84 Comment: LFA sitting  Pulse 85   Temp 98 F (36.7 C) (Oral)   Resp 20   Ht _0  (1.778 m)   Wt 173 lb 9.6 oz (78.7 kg)   LMP 08/23/2008   SpO2 97% Comment: room air  BMI 24.91 kg/m    Wt Readings from Last 3 Encounters:  12/12/16 173 lb 9.6 oz (78.7 kg)  12/07/16 172 lb (78 kg)  11/28/16 172 lb 3.2 oz (78.1 kg)

## 2016-12-13 DIAGNOSIS — C7931 Secondary malignant neoplasm of brain: Secondary | ICD-10-CM | POA: Insufficient documentation

## 2016-12-14 ENCOUNTER — Ambulatory Visit
Admission: RE | Admit: 2016-12-14 | Discharge: 2016-12-14 | Disposition: A | Discharge status: Home / Self Care | Payer: Medicare Other | Source: Ambulatory Visit | Attending: Radiation Oncology | Admitting: Radiation Oncology

## 2016-12-14 DIAGNOSIS — Z510 Encounter for antineoplastic radiation therapy: Secondary | ICD-10-CM | POA: Diagnosis not present

## 2016-12-14 DIAGNOSIS — C3490 Malignant neoplasm of unspecified part of unspecified bronchus or lung: Secondary | ICD-10-CM | POA: Diagnosis not present

## 2016-12-14 DIAGNOSIS — C7951 Secondary malignant neoplasm of bone: Secondary | ICD-10-CM | POA: Diagnosis not present

## 2016-12-14 DIAGNOSIS — Z853 Personal history of malignant neoplasm of breast: Secondary | ICD-10-CM | POA: Diagnosis not present

## 2016-12-14 DIAGNOSIS — M546 Pain in thoracic spine: Secondary | ICD-10-CM | POA: Diagnosis not present

## 2016-12-14 DIAGNOSIS — C7931 Secondary malignant neoplasm of brain: Secondary | ICD-10-CM | POA: Diagnosis not present

## 2016-12-14 NOTE — Progress Notes (Signed)
  Radiation Oncology         [phone] ________________________________  Name: [name] MRN: [id number]  Date: 12/14/2016  DOB: 03-15-1946  SIMULATION AND TREATMENT PLANNING NOTE    Outpatient  DIAGNOSIS:     ICD-10-CM   1. Bone metastases ([place]) C79.51     NARRATIVE:  The patient was brought to the [facility].  Identity was confirmed.  All relevant records and images related to the planned course of therapy were reviewed.  The patient freely provided informed written consent to proceed with treatment after reviewing the details related to the planned course of therapy. The consent form was witnessed and verified by the simulation staff.    Then, the patient was set-up in a stable reproducible supine position for radiation therapy - supine.  CT images were obtained.  Surface markings were placed.  The CT images were loaded into the planning software.    TREATMENT PLANNING NOTE: Treatment planning then occurred.  The radiation prescription was entered and confirmed.     A total of 2 medically necessary complex treatment devices were fabricated and supervised by me: 2 fields with MLCs for custom blocks to protect heart,and kidneys. MORE COMPLEX DEVICES MAY BE MADE IN DOSIMETRY FOR FIELD IN FIELD BEAMS FOR DOSE HOMOGENEITY.  I have requested : 3D Simulation which is medically necessary to give adequate dose to at risk tissues while sparing kidneys and heart.  I have requested a DVH of the following structures: spinal cord, heart, kidneys, ctv.    The patient will receive 30 Gy in 10 fractions to the T6-L1 spine with 2 fields. AP PA arrangement.   -----------------------------------  Eppie Gibson, MD

## 2016-12-14 NOTE — Progress Notes (Signed)
Radiation Oncology         [phone] ________________________________  Initial Outpatient Consultation  Name: Kelly Stuart MRN: [id number]  Date: 12/12/2016  DOB: [birth year]  CC:Jonathon Jordan, MD  Nicholas Lose, MD   REFERRING PHYSICIAN: Nicholas Lose, MD  DIAGNOSIS:    ICD-10-CM   1. Metastatic lung cancer (metastasis from lung to other site), unspecified laterality (HCC) C34.90   2. Brain metastases ([place]) C79.31     CHIEF COMPLAINT: Here to discuss management of lung cancer with brain and bone metastases  HISTORY OF PRESENT ILLNESS::Kelly Stuart is a 71 y.o. female who presented to her PCP, Dr. Stephanie Acre, on 10/21/2016 with a chronic cough that she had been having for two months prior. Dr. Stephanie Acre started the patient on antibiotics, which did not help. The patient was then instructed to try steroids and Z-Pak, which did help, but she still had symptoms which prompted a chest x-ray. Chest x-ray showed nodular density in the right mid chest which then prompted chest CT. Chest CT on 11/02/2016 showed a spiculated 1.8 cm nodule in the superior segment of the right lower lobe with right hilar and mediastinal adenopathy. Also noted were bilateral adrenal masses and lytic lesions involving the T12 spinous process and two lefts ribs, consistent with metastatic disease. A PET scan was also done on 11/23/2016 which corroborated many of these same findings. Biopsy of the left adrenal gland on 11/21/2016 revealed carcinoma, consistent with the patient's previously diagnosed metastatic lung adenocarcinoma.  The patient was referred to Dr. Lindi Adie who ordered an MRI of the head, done on 12/03/2016. This showed a 24 x 20 mm hemorrhagic metastatic deposit in the right frontal parietal lobe over the convexity near the motor strip, as well as a 6.5 mm enhancing metastatic deposit in the left medial parietal lobe. Dr. Lindi Adie then referred the patient to radiation oncology to discuss potential palliative  radiation treatment options for her brain and painful bony metastases.  On review of systems, the patient is positive for pain 1/10 to her left abdomen due to an episode of shingles. She reports weakness in and difficulty lifting her left arm. She reports continued back pain in the lower thoracic region, extending to the posterior left 8th rib. She still has some residual cough.  Of note, the patient has a past history of left breast invasive ductal carcinoma, treated with neoadjuvant antiestrogen therapy from January 2013 to November 2013, followed by left lumpectomy in October 2017. At that time she declined radiation and adjuvant antiestrogen therapies.   Biopsies:  Diagnosis 12/07/2016: Soft Tissue Needle Core Biopsy, Chest wall NON SMALL CELL POORLY DIFFERENTIATED ADENOCARCINOMA This was a biopsy of her left lateral shoulder area.   11/21/16 Diagnosis Adrenal gland, biopsy, Left - CARCINOMA, CONSISTENT WITH METASTATIC LUNG ADENOCARCINOMA  PREVIOUS RADIATION THERAPY: No  PAST MEDICAL HISTORY:  has a past medical history of Anxiety and Breast cancer ([place]).    PAST SURGICAL HISTORY: Past Surgical History:  Procedure Laterality Date  . BREAST LUMPECTOMY WITH NEEDLE LOCALIZATION  02/29/2012   Procedure: BREAST LUMPECTOMY WITH NEEDLE LOCALIZATION;  Surgeon: Rolm Bookbinder, MD;  Location: [facility];  Service: General;  Laterality: Left;  . BREAST SURGERY    . COSMETIC SURGERY    . DILATION AND CURETTAGE OF UTERUS  1998   submucus myoma-resected  . EYE SURGERY    . FACELIFT W/BLEPHAROPLASTY  1999      . maxillofacial surgery  1980   mouth-ealign teeth-  . minifacelift  2007  . RADIOACTIVE SEED GUIDED EXCISIONAL BREAST BIOPSY N/A 02/11/2016   Procedure: LEFT BREAST RADIOACTIVE SEED GUIDED EXCISIONAL BIOPSY;  Surgeon: Rolm Bookbinder, MD;  Location: [facility];  Service: General;  Laterality: N/A;  . TONSILLECTOMY      FAMILY HISTORY: family  history includes Cancer in her father and mother; Colon cancer in her father, mother, and paternal aunt; Hepatitis in her father; Hiatal hernia in her father; Rheumatic fever in her sister.  SOCIAL HISTORY:  reports that she quit smoking about 25 years ago. Her smoking use included Cigarettes. She smoked 2.00 packs per day. She has never used smokeless tobacco. She reports that she drinks about 1.8 oz of alcohol per week . She reports that she does not use drugs.  ALLERGIES: Codeine; Penicillins; Sulfa antibiotics; and Tetracyclines & related  MEDICATIONS:  Current Outpatient Prescriptions  Medication Sig Dispense Refill  . amLODipine (NORVASC) 5 MG tablet Take 5 mg by mouth daily.    . cimetidine (TAGAMET) 200 MG tablet Take 200 mg by mouth daily as needed (acid reflux).    Marland Kitchen escitalopram (LEXAPRO) 10 MG tablet Take 10 mg by mouth daily.     Marland Kitchen ibuprofen (ADVIL,MOTRIN) 200 MG tablet Take 200 mg by mouth 2 (two) times daily as needed for headache or moderate pain.     No current facility-administered medications for this encounter.     REVIEW OF SYSTEMS:  A 10+ POINT REVIEW OF SYSTEMS WAS OBTAINED including neurology, dermatology, psychiatry, cardiac, respiratory, lymph, extremities,  GI, Musculoskeletal, constitutional, breasts, HEENT.  All pertinent positives are noted in the HPI.  All others are negative.   PHYSICAL EXAM:  height is 5\' 10"  (1.778 m) and weight is 173 lb 9.6 oz (78.7 kg). Her oral temperature is 98 F (36.7 C). Her blood pressure is 142/84 (abnormal) and her pulse is 85. Her respiration is 20 and oxygen saturation is 97%.   General: Alert and oriented, in no acute distress. HEENT: Head is normocephalic. Extraocular movements are intact. Oropharynx is clear. Neck: Neck is supple, no palpable cervical or supraclavicular lymphadenopathy. Heart: Regular in rate and rhythm with no murmurs, rubs, or gallops. Chest: Scattered wheezes bilaterally. Palpable left lateral chest mass,  subcutaneous at the site of her diagnostic biopsy. Abdomen: Soft, nontender, nondistended, with no rigidity or guarding. Extremities: No cyanosis or edema. Lymphatics: see Neck Exam Skin: No concerning lesions. No residual signs of shingles over her torso. Musculoskeletal: Symmetric strength and muscle tone throughout. She did not have any reproducible tenderness to palpation along the spine. Neurologic: Cranial nerves II through XII are grossly intact. No obvious focalities. Speech is fluent. Coordination is intact. She has difficulty abducting her left arm past 90 degrees. Psychiatric: Judgment and insight are intact. Affect is appropriate.   ECOG = 1  0 - Asymptomatic (Fully active, able to carry on all predisease activities without restriction)  1 - Symptomatic but completely ambulatory (Restricted in physically strenuous activity but ambulatory and able to carry out work of a light or sedentary nature. For example, light housework, office work)  2 - Symptomatic, <50% in bed during the day (Ambulatory and capable of all self care but unable to carry out any work activities. Up and about more than 50% of waking hours)  3 - Symptomatic, >50% in bed, but not bedbound (Capable of only limited self-care, confined to bed or chair 50% or more of waking hours)  4 - Bedbound (Completely disabled. Cannot carry on any self-care. Totally  confined to bed or chair)  5 - Death   Eustace Pen MM, Creech RH, Tormey DC, et al. [phone]). "Toxicity and response criteria of the [facility]". [place] Oncol. 5 (6): 649-55   LABORATORY DATA:  Lab Results  Component Value Date   WBC 7.4 12/07/2016   HGB 12.7 12/07/2016   HCT 38.9 12/07/2016   MCV 90.3 12/07/2016   PLT 432 (H) 12/07/2016   CMP     Component Value Date/Time   NA 139 11/21/2016 1038   NA 141 02/03/2014 1259   K 4.1 11/21/2016 1038   K 4.2 02/03/2014 1259   CL 107 11/21/2016 1038   CL 106 06/12/2012 1411   CO2  25 11/21/2016 1038   CO2 25 02/03/2014 1259   GLUCOSE 114 (H) 11/21/2016 1038   GLUCOSE 121 02/03/2014 1259   GLUCOSE 163 (H) 06/12/2012 1411   BUN 8 11/21/2016 1038   BUN 13.1 02/03/2014 1259   CREATININE 0.63 11/21/2016 1038   CREATININE 0.8 02/03/2014 1259   CALCIUM 8.8 (L) 11/21/2016 1038   CALCIUM 9.6 02/03/2014 1259   PROT 7.0 11/21/2016 1038   PROT 7.4 02/03/2014 1259   ALBUMIN 3.4 (L) 11/21/2016 1038   ALBUMIN 3.8 02/03/2014 1259   AST 24 11/21/2016 1038   AST 17 02/03/2014 1259   ALT 13 (L) 11/21/2016 1038   ALT 17 02/03/2014 1259   ALKPHOS 87 11/21/2016 1038   ALKPHOS 56 02/03/2014 1259   BILITOT 1.1 11/21/2016 1038   BILITOT 0.59 02/03/2014 1259   GFRNONAA >60 11/21/2016 1038   GFRAA >60 11/21/2016 1038         RADIOGRAPHY: Mr Brain W Wo Contrast  Result Date: 12/03/2016 CLINICAL DATA:  Metastatic lung cancer. History of breast cancer 2013 EXAM: MRI HEAD WITHOUT AND WITH CONTRAST TECHNIQUE: Multiplanar, multiecho pulse sequences of the brain and surrounding structures were obtained without and with intravenous contrast. CONTRAST:  15 mL MultiHance IV COMPARISON:  None. FINDINGS: Brain: Right frontal parietal enhancing mass lesion over the convexity near the motor strip. Lesion shows internal hemorrhage. Enhancing component measures 24 x 20 mm with mild surrounding edema. 6.5 mm enhancing nodule left medial parietal lobe with mild edema consistent with metastatic disease. No other enhancing lesions. Ventricle size is normal. No shift of the midline structures. Negative for acute infarct. Mild chronic ischemic change in the white matter. Vascular: Normal arterial flow voids Skull and upper cervical spine: Negative Sinuses/Orbits: Negative Other: None IMPRESSION: 24 x 20 mm hemorrhagic metastatic deposit in the right frontal parietal lobe over the convexity near the motor strip 6.5 mm enhancing metastatic deposit left medial parietal lobe. Electronically Signed   By: Franchot Gallo M.D.   On: 12/03/2016 14:44   Nm Pet Image Initial (pi) Skull Base To Thigh  Result Date: 11/24/2016 CLINICAL DATA:  Initial treatment strategy for lung nodule. History of breast carcinoma. EXAM: NUCLEAR MEDICINE PET SKULL BASE TO THIGH TECHNIQUE: 8.8 mCi F-18 FDG was injected intravenously. Full-ring PET imaging was performed from the skull base to thigh after the radiotracer. CT data was obtained and used for attenuation correction and anatomic localization. FASTING BLOOD GLUCOSE:  Value: 103 mg/dl COMPARISON:  11/02/2016 FINDINGS: NECK Small hypermetabolic RIGHT level IV lymph node with SUV max equal 8.7 superficial to the strap muscles. CHEST Hypermetabolic 18 mm lung nodule in superior segment of the RIGHT lower lobe with SUV max equal 3.7. There is more intense hypermetabolic mass in the inferior RIGHT hilum with  SUV max equal 12. Hypermetabolic paratracheal adenopathy and subcarinal adenopathy. Within the soft tissues of the LEFT lateral chest wall, hypermetabolic nodule just superficial to the chest wall musculature measuring 13 mm (image 80, series 4) with SUV max equal 15 ABDOMEN/PELVIS Bilateral enlarged hypermetabolic adrenal glands. The LEFT adrenal gland measures up to 5 cm SUV max equal 13. Hypermetabolic retrocrural lymph node noted. Multiple small hypermetabolic lymph nodes in the retroperitoneum along the aorta. SKELETON There is a fracture of the posterior LEFT eighth rib with associated soft tissue component extending into the transverse process of the vertebral body at that level and into the pleural space with SUV max equal 13. Soft tissue component measures approximately 2.5 by 2.2 cm (image 71, series 4) Additional muscle metastasis within the LEFT shoulder musculature (image 45 of the fused data set) as well as the anterior LEFT thigh musculature (image 211 of the fused data set). IMPRESSION: 1. Hypermetabolic RIGHT lower lobe nodule consistent with bronchogenic carcinoma versus  metastatic lesion. Favor lung carcinoma. 2. Hypermetabolic mass in the RIGHT hilum as well as hypermetabolic mediastinal nodal metastasis. 3. Bilateral enlarged intensely hypermetabolic adrenal metastasis. 4. Pathologic fracture of the LEFT eighth rib adjacent to the vertebral body with soft tissue expansion. 5. Soft tissue metastasis superficial to the LEFT lateral chest musculature. Additional muscle metastasis in the LEFT shoulder and LEFT thigh. 6. Small neck nodal metastasis to the RIGHT level IV nodal station. Electronically Signed   By: Suzy Bouchard M.D.   On: 11/24/2016 08:30   US Biopsy  Result Date: 12/07/2016 INDICATION: 71 year old female with history of breast carcinoma. FDG avid nodule of the left chest wall, with concern for metastases. EXAM: ULTRASOUND-GUIDED BIOPSY LEFT CHEST WALL NODULE MEDICATIONS: None. ANESTHESIA/SEDATION: None FLUOROSCOPY TIME:  None COMPLICATIONS: None PROCEDURE: Informed written consent was obtained from the patient after a thorough discussion of the procedural risks, benefits and alternatives. All questions were addressed. Maximal Sterile Barrier Technique was utilized including caps, mask, sterile gowns, sterile gloves, sterile drape, hand hygiene and skin antiseptic. A timeout was performed prior to the initiation of the procedure. Patient positioned right decubitus on the ultrasound table. Ultrasound images of left chest wall or performed with images stored and sent to PACs. The patient was then prepped and draped in the usual sterile fashion. The skin and subcutaneous tissues were generously infiltrated 1% lidocaine for local anesthesia. Using ultrasound guidance, multiple 18 gauge core biopsy of chest wall lesion or acquired. These are placed into formalin solution. Final images were stored. Patient tolerated the procedure well and remained hemodynamically stable throughout. No complications were encountered and no significant blood loss. IMPRESSION: Status post  ultrasound-guided biopsy of left chest wall lesion. Tissue specimen sent to pathology for complete histopathologic analysis. Signed, Dulcy Fanny. Earleen Newport, DO [facility] Rad[facility] Electronically Signed   By: Corrie Mckusick D.O.   On: 12/07/2016 16:37   Ct Biopsy  Result Date: 11/21/2016 INDICATION: Lung mass.  Bilateral adrenal masses. EXAM: CT BIOPSY MEDICATIONS: None. ANESTHESIA/SEDATION: Fentanyl 100 mcg IV; Versed 4 mg IV Moderate Sedation Time:  13 The patient was continuously monitored during the procedure by the interventional radiology nurse under my direct supervision. FLUOROSCOPY TIME:  None COMPLICATIONS: None immediate. PROCEDURE: Informed written consent was obtained from the patient after a thorough discussion of the procedural risks, benefits and alternatives. All questions were addressed. Maximal Sterile Barrier Technique was utilized including caps, mask, sterile gowns, sterile gloves, sterile drape, hand hygiene and skin antiseptic. A  timeout was performed prior to the initiation of the procedure. Under CT guidance, a(n) 17 gauge guide needle was advanced into the left adrenal mass. Subsequently 4 18 gauge core biopsies were obtained. The guide needle was removed. Post biopsy images demonstrate no hemorrhage or pneumothorax. Patient tolerated the procedure well without complication. Vital sign monitoring by nursing staff during the procedure will continue as patient is in the special procedures unit for post procedure observation. FINDINGS: The images document guide needle placement within the left adrenal mass. Post biopsy images demonstrate no hemorrhage or pneumothorax. IMPRESSION: Successful CT-guided left adrenal mass core biopsy. Electronically Signed   By: Marybelle Killings M.D.   On: 11/21/2016 13:36      IMPRESSION/PLAN: Metastatic Lung Cancer to the brain with symptomatic thoracic bone metastases  Today, I talked to the patient about the  findings and work-up thus far. We discussed the patient's diagnosis of metastatic lung cancer and general treatment for this, highlighting the role of radiotherapy in the management. We discussed the available radiation techniques, and focused on the details of logistics and delivery.   For the patient's painful bone metastases, I recommend treating from approximately T6-L1 with eight rib metastasis with palliative radiation over a two-week course. We discussed the risks, benefits, and side effects of radiotherapy. Side effects may include but not necessarily be limited to: internal organ injury, fatigue, skin irritation, and nausea. No guarantees of treatment were given. A consent form was signed and placed in the patient's medical record. The patient was encouraged to ask questions that I answered to the best of my ability.   For her brain metastases, we discussed the options of whole brain radiotherapy versus stereotactic radiosurgery. We discussed the risks, benefits, and side effects of both options. She is enthusiastic about [facility]. She will be scheduled by Mont Dutton for 3-T MRI as well as an appointment with a neurosurgeon and CT simulation. I anticipate that we will start planning her palliative spine radiation in the near future and her SRS soon after.   Consent form was also signed for radiosurgery to the brain. She will wait for results from medical oncology regarding specific systemic treatment options. I anticipate starting her radiotherapy sometime next week.  __________________________________________   Eppie Gibson, MD  This document serves as a record of services personally performed by Eppie Gibson, MD. It was created on her behalf by Rae Lips, a trained medical scribe. The creation of this record is based on the scribe's personal observations and the provider's statements to them. This document has been checked and approved by the attending provider.

## 2016-12-16 DIAGNOSIS — C3490 Malignant neoplasm of unspecified part of unspecified bronchus or lung: Secondary | ICD-10-CM | POA: Diagnosis not present

## 2016-12-16 DIAGNOSIS — C7951 Secondary malignant neoplasm of bone: Secondary | ICD-10-CM | POA: Diagnosis not present

## 2016-12-16 DIAGNOSIS — M546 Pain in thoracic spine: Secondary | ICD-10-CM | POA: Diagnosis not present

## 2016-12-16 DIAGNOSIS — Z510 Encounter for antineoplastic radiation therapy: Secondary | ICD-10-CM | POA: Diagnosis not present

## 2016-12-16 DIAGNOSIS — C7931 Secondary malignant neoplasm of brain: Secondary | ICD-10-CM | POA: Diagnosis not present

## 2016-12-16 DIAGNOSIS — Z853 Personal history of malignant neoplasm of breast: Secondary | ICD-10-CM | POA: Diagnosis not present

## 2016-12-19 ENCOUNTER — Ambulatory Visit
Admission: RE | Admit: 2016-12-19 | Discharge: 2016-12-19 | Disposition: A | Discharge status: Home / Self Care | Payer: Medicare Other | Source: Ambulatory Visit | Attending: Radiation Oncology | Admitting: Radiation Oncology

## 2016-12-19 DIAGNOSIS — Z853 Personal history of malignant neoplasm of breast: Secondary | ICD-10-CM | POA: Diagnosis not present

## 2016-12-19 DIAGNOSIS — M546 Pain in thoracic spine: Secondary | ICD-10-CM | POA: Diagnosis not present

## 2016-12-19 DIAGNOSIS — C3490 Malignant neoplasm of unspecified part of unspecified bronchus or lung: Secondary | ICD-10-CM | POA: Diagnosis not present

## 2016-12-19 DIAGNOSIS — C7951 Secondary malignant neoplasm of bone: Secondary | ICD-10-CM | POA: Diagnosis not present

## 2016-12-19 DIAGNOSIS — Z510 Encounter for antineoplastic radiation therapy: Secondary | ICD-10-CM | POA: Diagnosis not present

## 2016-12-19 DIAGNOSIS — C7931 Secondary malignant neoplasm of brain: Secondary | ICD-10-CM | POA: Diagnosis not present

## 2016-12-19 NOTE — Progress Notes (Signed)
error 

## 2016-12-20 ENCOUNTER — Ambulatory Visit
Admission: RE | Admit: 2016-12-20 | Discharge: 2016-12-20 | Disposition: A | Discharge status: Home / Self Care | Payer: Medicare Other | Source: Ambulatory Visit | Attending: Radiation Oncology | Admitting: Radiation Oncology

## 2016-12-20 DIAGNOSIS — Z853 Personal history of malignant neoplasm of breast: Secondary | ICD-10-CM | POA: Diagnosis not present

## 2016-12-20 DIAGNOSIS — C7949 Secondary malignant neoplasm of other parts of nervous system: Principal | ICD-10-CM

## 2016-12-20 DIAGNOSIS — C7931 Secondary malignant neoplasm of brain: Secondary | ICD-10-CM

## 2016-12-20 DIAGNOSIS — Z510 Encounter for antineoplastic radiation therapy: Secondary | ICD-10-CM | POA: Diagnosis not present

## 2016-12-20 DIAGNOSIS — M546 Pain in thoracic spine: Secondary | ICD-10-CM | POA: Diagnosis not present

## 2016-12-20 DIAGNOSIS — C7951 Secondary malignant neoplasm of bone: Secondary | ICD-10-CM | POA: Diagnosis not present

## 2016-12-20 DIAGNOSIS — C3490 Malignant neoplasm of unspecified part of unspecified bronchus or lung: Secondary | ICD-10-CM | POA: Diagnosis not present

## 2016-12-20 MED ORDER — GADOBENATE DIMEGLUMINE 529 MG/ML IV SOLN
15.0000 mL | Freq: Once | INTRAVENOUS | Status: AC | PRN
  Administered 2016-12-03: 15 mL via INTRAVENOUS

## 2016-12-20 MED ORDER — GADOBENATE DIMEGLUMINE 529 MG/ML IV SOLN
15.0000 mL | Freq: Once | INTRAVENOUS | Status: AC | PRN
  Administered 2016-12-20: 15 mL via INTRAVENOUS

## 2016-12-20 NOTE — Addendum Note (Signed)
Encounter addended by: Stevenson Clinch on: 12/20/2016 12:48 PM<BR>    Actions taken: Imaging Exam ended, Order list changed, MAR administration accepted

## 2016-12-21 ENCOUNTER — Ambulatory Visit
Admission: RE | Admit: 2016-12-21 | Discharge: 2016-12-21 | Disposition: A | Discharge status: Home / Self Care | Payer: Medicare Other | Source: Ambulatory Visit | Attending: Radiation Oncology | Admitting: Radiation Oncology

## 2016-12-21 ENCOUNTER — Ambulatory Visit: Admission: RE | Admit: 2016-12-21 | Payer: Medicare Other | Source: Ambulatory Visit | Admitting: Radiation Oncology

## 2016-12-21 ENCOUNTER — Ambulatory Visit: Payer: Medicare Other | Admitting: Radiation Oncology

## 2016-12-21 DIAGNOSIS — C3490 Malignant neoplasm of unspecified part of unspecified bronchus or lung: Secondary | ICD-10-CM | POA: Diagnosis not present

## 2016-12-21 DIAGNOSIS — M546 Pain in thoracic spine: Secondary | ICD-10-CM | POA: Diagnosis not present

## 2016-12-21 DIAGNOSIS — Z853 Personal history of malignant neoplasm of breast: Secondary | ICD-10-CM | POA: Diagnosis not present

## 2016-12-21 DIAGNOSIS — Z510 Encounter for antineoplastic radiation therapy: Secondary | ICD-10-CM | POA: Diagnosis not present

## 2016-12-21 DIAGNOSIS — C7951 Secondary malignant neoplasm of bone: Secondary | ICD-10-CM | POA: Diagnosis not present

## 2016-12-21 DIAGNOSIS — C7931 Secondary malignant neoplasm of brain: Secondary | ICD-10-CM | POA: Diagnosis not present

## 2016-12-21 NOTE — Progress Notes (Signed)
Has armband been applied?  Yes  Does patient have an allergy to IV contrast dye?: No   Has patient ever received premedication for IV contrast dye?: N/A  Does patient take metformin?: No  If patient does take metformin when was the last dose: N/A  Date of lab work: 11/21/16 BUN: N/A CR: 0.63 EGFR: > 60  IV site: Left Antecubital  Has IV site been added to flowsheet?  Yes  BP 122/70   Pulse 95   Temp 98.4 F (36.9 C)   LMP 08/23/2008   SpO2 95% Comment: room air

## 2016-12-21 NOTE — Progress Notes (Signed)
error 

## 2016-12-22 ENCOUNTER — Ambulatory Visit
Admission: RE | Admit: 2016-12-22 | Discharge: 2016-12-22 | Disposition: A | Discharge status: Home / Self Care | Payer: Medicare Other | Source: Ambulatory Visit | Attending: Radiation Oncology | Admitting: Radiation Oncology

## 2016-12-22 DIAGNOSIS — C7931 Secondary malignant neoplasm of brain: Secondary | ICD-10-CM | POA: Diagnosis not present

## 2016-12-22 DIAGNOSIS — Z853 Personal history of malignant neoplasm of breast: Secondary | ICD-10-CM | POA: Diagnosis not present

## 2016-12-22 DIAGNOSIS — Z510 Encounter for antineoplastic radiation therapy: Secondary | ICD-10-CM | POA: Diagnosis not present

## 2016-12-22 DIAGNOSIS — C3490 Malignant neoplasm of unspecified part of unspecified bronchus or lung: Secondary | ICD-10-CM | POA: Diagnosis not present

## 2016-12-22 DIAGNOSIS — C7951 Secondary malignant neoplasm of bone: Secondary | ICD-10-CM | POA: Diagnosis not present

## 2016-12-22 DIAGNOSIS — M546 Pain in thoracic spine: Secondary | ICD-10-CM | POA: Diagnosis not present

## 2016-12-23 ENCOUNTER — Other Ambulatory Visit: Payer: Self-pay | Admitting: Radiation Oncology

## 2016-12-23 ENCOUNTER — Ambulatory Visit
Admission: RE | Admit: 2016-12-23 | Discharge: 2016-12-23 | Disposition: A | Discharge status: Home / Self Care | Payer: Medicare Other | Source: Ambulatory Visit | Attending: Radiation Oncology | Admitting: Radiation Oncology

## 2016-12-23 ENCOUNTER — Encounter: Payer: Self-pay | Admitting: Radiation Therapy

## 2016-12-23 ENCOUNTER — Other Ambulatory Visit: Payer: Self-pay

## 2016-12-23 ENCOUNTER — Telehealth: Payer: Self-pay

## 2016-12-23 VITALS — BP 122/70 | HR 95 | Temp 98.4°F

## 2016-12-23 DIAGNOSIS — Z510 Encounter for antineoplastic radiation therapy: Secondary | ICD-10-CM | POA: Diagnosis not present

## 2016-12-23 DIAGNOSIS — C7931 Secondary malignant neoplasm of brain: Secondary | ICD-10-CM | POA: Diagnosis not present

## 2016-12-23 DIAGNOSIS — Z853 Personal history of malignant neoplasm of breast: Secondary | ICD-10-CM | POA: Diagnosis not present

## 2016-12-23 DIAGNOSIS — C3490 Malignant neoplasm of unspecified part of unspecified bronchus or lung: Secondary | ICD-10-CM | POA: Diagnosis not present

## 2016-12-23 DIAGNOSIS — C7951 Secondary malignant neoplasm of bone: Secondary | ICD-10-CM | POA: Diagnosis not present

## 2016-12-23 DIAGNOSIS — M546 Pain in thoracic spine: Secondary | ICD-10-CM | POA: Diagnosis not present

## 2016-12-23 MED ORDER — LORAZEPAM 1 MG PO TABS: ORAL_TABLET | ORAL | 0 refills | Status: DC

## 2016-12-23 MED ORDER — LORAZEPAM 1 MG PO TABS
1.0000 mg | ORAL_TABLET | Freq: Once | ORAL | Status: DC
  Filled 2016-12-23: qty 1

## 2016-12-23 MED ORDER — SODIUM CHLORIDE 0.9% FLUSH
10.0000 mL | Freq: Once | INTRAVENOUS | Status: AC
  Administered 2016-12-23: 10 mL via INTRAVENOUS

## 2016-12-23 MED ORDER — LORAZEPAM 1 MG PO TABS
1.0000 mg | ORAL_TABLET | Freq: Once | ORAL | Status: AC
  Administered 2016-12-23: 1 mg via ORAL
  Filled 2016-12-23: qty 1

## 2016-12-23 NOTE — Progress Notes (Signed)
    The patient was offered enrollment on our single institutional trial investigating open-faced vs closed-face head masks used to immobilize patients during stereotactic brain radiosurgery. The patient has elected to enroll on this trial.  In regards to the trial, the patient has voluntarily signed copies of the consent forms and all trial related questions were answered.   Kelly Stuart is study # 29 and randomized to have the brain Lab Mask. All paperwork was filled out and given to Claudean Severance for documentation.   Mont Dutton R.T.(R)(T) Special Procedures Navigator

## 2016-12-23 NOTE — Progress Notes (Signed)
  Name: Kelly Stuart MRN: [id number]  Date: 12/23/2016  DOB: 23-Dec-1945  SIMULATION AND TREATMENT PLANNING NOTE    ICD-10-CM   1. Brain metastases [facility]) C79.31       Radiation Oncology         [phone] ________________________________  Name: Kelly Stuart MRN: [id number]  Date: 12/23/2016  DOB: 1945-09-13  SIMULATION AND TREATMENT PLANNING NOTE    ICD-10-CM   1. Brain metastases ([place]) C79.31     DIAGNOSIS:  As above  NARRATIVE:  The patient was brought to the [facility].  Identity was confirmed.  All relevant records and images related to the planned course of therapy were reviewed.  The patient freely provided informed written consent to proceed with treatment after reviewing the details related to the planned course of therapy. The consent form was witnessed and verified by the simulation staff. Intravenous access was established for contrast administration. Then, the patient was set-up in a stable reproducible supine position for radiation therapy.  A relocatable thermoplastic stereotactic head frame was fabricated for precise immobilization.  CT images were obtained.  Surface markings were placed.  The CT images were loaded into the planning software and fused with the patient's targeting MRI scan.  Then the target and avoidance structures were contoured.  Treatment planning then occurred.  The radiation prescription was entered and confirmed.  I have requested 3D planning  I have requested a DVH of the following structures: Brain stem, brain, left eye, right eye, lenses, optic chiasm, target volumes, uninvolved brain, and normal tissue.    SPECIAL TREATMENT PROCEDURE:  The planned course of therapy using radiation constitutes a special treatment procedure. Special care is required in the management of this patient for the following reasons:  High dose per fraction requiring special monitoring for increased toxicities of treatment including daily imaging.  The  special nature of the planned course of radiotherapy will require increased physician supervision and oversight to ensure patient's safety with optimal treatment outcomes.  PLAN:  The patient will receive 27 Gy in 3 fractions to the 50mm posterior right frontal lobe mass and 20 Gy in 1 fraction to the 75mm Left parietal and 91mm Left parietal masses. ________________________________    Eppie Gibson, MD

## 2016-12-23 NOTE — Telephone Encounter (Signed)
Called pt to let her know of her follow up appt with Dr.Gudena after radiation treatment. Confirmed time/date 01/06/17 at 1045am.

## 2016-12-27 ENCOUNTER — Ambulatory Visit
Admission: RE | Admit: 2016-12-27 | Discharge: 2016-12-27 | Disposition: A | Discharge status: Home / Self Care | Payer: Medicare Other | Source: Ambulatory Visit | Attending: Radiation Oncology | Admitting: Radiation Oncology

## 2016-12-27 DIAGNOSIS — M546 Pain in thoracic spine: Secondary | ICD-10-CM | POA: Diagnosis not present

## 2016-12-27 DIAGNOSIS — Z853 Personal history of malignant neoplasm of breast: Secondary | ICD-10-CM | POA: Diagnosis not present

## 2016-12-27 DIAGNOSIS — C3490 Malignant neoplasm of unspecified part of unspecified bronchus or lung: Secondary | ICD-10-CM | POA: Diagnosis not present

## 2016-12-27 DIAGNOSIS — C7951 Secondary malignant neoplasm of bone: Secondary | ICD-10-CM | POA: Diagnosis not present

## 2016-12-27 DIAGNOSIS — Z510 Encounter for antineoplastic radiation therapy: Secondary | ICD-10-CM | POA: Diagnosis not present

## 2016-12-27 DIAGNOSIS — C7931 Secondary malignant neoplasm of brain: Secondary | ICD-10-CM | POA: Diagnosis not present

## 2016-12-28 ENCOUNTER — Ambulatory Visit: Payer: Medicare Other | Admitting: Radiation Oncology

## 2016-12-28 ENCOUNTER — Ambulatory Visit
Admission: RE | Admit: 2016-12-28 | Discharge: 2016-12-28 | Disposition: A | Discharge status: Home / Self Care | Payer: Medicare Other | Source: Ambulatory Visit | Attending: Radiation Oncology | Admitting: Radiation Oncology

## 2016-12-28 DIAGNOSIS — I10 Essential (primary) hypertension: Secondary | ICD-10-CM | POA: Diagnosis not present

## 2016-12-28 DIAGNOSIS — C7951 Secondary malignant neoplasm of bone: Secondary | ICD-10-CM | POA: Diagnosis not present

## 2016-12-28 DIAGNOSIS — Z853 Personal history of malignant neoplasm of breast: Secondary | ICD-10-CM | POA: Diagnosis not present

## 2016-12-28 DIAGNOSIS — C7931 Secondary malignant neoplasm of brain: Secondary | ICD-10-CM | POA: Diagnosis not present

## 2016-12-28 DIAGNOSIS — Z510 Encounter for antineoplastic radiation therapy: Secondary | ICD-10-CM | POA: Diagnosis not present

## 2016-12-28 DIAGNOSIS — C3490 Malignant neoplasm of unspecified part of unspecified bronchus or lung: Secondary | ICD-10-CM | POA: Diagnosis not present

## 2016-12-28 DIAGNOSIS — M546 Pain in thoracic spine: Secondary | ICD-10-CM | POA: Diagnosis not present

## 2016-12-29 ENCOUNTER — Telehealth: Payer: Self-pay | Admitting: Internal Medicine

## 2016-12-29 ENCOUNTER — Ambulatory Visit
Admission: RE | Admit: 2016-12-29 | Discharge: 2016-12-29 | Disposition: A | Discharge status: Home / Self Care | Payer: Medicare Other | Source: Ambulatory Visit | Attending: Radiation Oncology | Admitting: Radiation Oncology

## 2016-12-29 DIAGNOSIS — C7931 Secondary malignant neoplasm of brain: Secondary | ICD-10-CM | POA: Diagnosis not present

## 2016-12-29 DIAGNOSIS — Z853 Personal history of malignant neoplasm of breast: Secondary | ICD-10-CM | POA: Diagnosis not present

## 2016-12-29 DIAGNOSIS — C7951 Secondary malignant neoplasm of bone: Secondary | ICD-10-CM | POA: Diagnosis not present

## 2016-12-29 DIAGNOSIS — C3490 Malignant neoplasm of unspecified part of unspecified bronchus or lung: Secondary | ICD-10-CM | POA: Diagnosis not present

## 2016-12-29 DIAGNOSIS — Z510 Encounter for antineoplastic radiation therapy: Secondary | ICD-10-CM | POA: Diagnosis not present

## 2016-12-29 DIAGNOSIS — M546 Pain in thoracic spine: Secondary | ICD-10-CM | POA: Diagnosis not present

## 2016-12-29 NOTE — Telephone Encounter (Signed)
sw pt confirm 9/7 appt at 10 am per sch msg

## 2016-12-30 ENCOUNTER — Telehealth: Payer: Self-pay | Admitting: Internal Medicine

## 2016-12-30 ENCOUNTER — Emergency Department (HOSPITAL_COMMUNITY): Payer: Medicare Other

## 2016-12-30 ENCOUNTER — Encounter (HOSPITAL_COMMUNITY): Payer: Self-pay

## 2016-12-30 ENCOUNTER — Encounter: Payer: Self-pay | Admitting: Internal Medicine

## 2016-12-30 ENCOUNTER — Ambulatory Visit
Admission: RE | Admit: 2016-12-30 | Discharge: 2016-12-30 | Disposition: A | Discharge status: Home / Self Care | Payer: Medicare Other | Source: Ambulatory Visit | Attending: Radiation Oncology | Admitting: Radiation Oncology

## 2016-12-30 ENCOUNTER — Observation Stay (HOSPITAL_COMMUNITY)
Admission: EM | Admit: 2016-12-30 | Discharge: 2016-12-31 | Disposition: A | Discharge status: Home / Self Care | Payer: Medicare Other | Attending: Family Medicine | Admitting: Family Medicine

## 2016-12-30 ENCOUNTER — Ambulatory Visit (HOSPITAL_BASED_OUTPATIENT_CLINIC_OR_DEPARTMENT_OTHER): Payer: Medicare Other | Admitting: Internal Medicine

## 2016-12-30 ENCOUNTER — Telehealth: Payer: Self-pay | Admitting: Radiation Oncology

## 2016-12-30 VITALS — BP 125/69 | HR 93 | Temp 97.5°F | Resp 18 | Ht 70.0 in | Wt 166.4 lb

## 2016-12-30 VITALS — BP 132/76 | HR 85 | Temp 97.8°F

## 2016-12-30 DIAGNOSIS — Z853 Personal history of malignant neoplasm of breast: Secondary | ICD-10-CM | POA: Insufficient documentation

## 2016-12-30 DIAGNOSIS — Z87891 Personal history of nicotine dependence: Secondary | ICD-10-CM | POA: Diagnosis not present

## 2016-12-30 DIAGNOSIS — R569 Unspecified convulsions: Secondary | ICD-10-CM

## 2016-12-30 DIAGNOSIS — M546 Pain in thoracic spine: Secondary | ICD-10-CM | POA: Diagnosis not present

## 2016-12-30 DIAGNOSIS — R270 Ataxia, unspecified: Secondary | ICD-10-CM | POA: Diagnosis not present

## 2016-12-30 DIAGNOSIS — Z79899 Other long term (current) drug therapy: Secondary | ICD-10-CM | POA: Diagnosis not present

## 2016-12-30 DIAGNOSIS — F419 Anxiety disorder, unspecified: Secondary | ICD-10-CM | POA: Insufficient documentation

## 2016-12-30 DIAGNOSIS — T424X5A Adverse effect of benzodiazepines, initial encounter: Secondary | ICD-10-CM | POA: Insufficient documentation

## 2016-12-30 DIAGNOSIS — C7931 Secondary malignant neoplasm of brain: Secondary | ICD-10-CM | POA: Diagnosis not present

## 2016-12-30 DIAGNOSIS — Z510 Encounter for antineoplastic radiation therapy: Secondary | ICD-10-CM | POA: Diagnosis not present

## 2016-12-30 DIAGNOSIS — T426X5A Adverse effect of other antiepileptic and sedative-hypnotic drugs, initial encounter: Secondary | ICD-10-CM | POA: Insufficient documentation

## 2016-12-30 DIAGNOSIS — R531 Weakness: Secondary | ICD-10-CM | POA: Diagnosis not present

## 2016-12-30 DIAGNOSIS — G8194 Hemiplegia, unspecified affecting left nondominant side: Secondary | ICD-10-CM | POA: Diagnosis not present

## 2016-12-30 DIAGNOSIS — C3490 Malignant neoplasm of unspecified part of unspecified bronchus or lung: Secondary | ICD-10-CM | POA: Insufficient documentation

## 2016-12-30 DIAGNOSIS — C7951 Secondary malignant neoplasm of bone: Secondary | ICD-10-CM | POA: Diagnosis not present

## 2016-12-30 DIAGNOSIS — I10 Essential (primary) hypertension: Secondary | ICD-10-CM | POA: Diagnosis not present

## 2016-12-30 DIAGNOSIS — S0990XA Unspecified injury of head, initial encounter: Secondary | ICD-10-CM | POA: Diagnosis not present

## 2016-12-30 DIAGNOSIS — G4089 Other seizures: Secondary | ICD-10-CM | POA: Insufficient documentation

## 2016-12-30 LAB — CBC WITH DIFFERENTIAL/PLATELET
Basophils Absolute: 0 10*3/uL (ref 0.0–0.1)
Basophils Relative: 0 %
Eosinophils Absolute: 0 10*3/uL (ref 0.0–0.7)
Eosinophils Relative: 0 %
HCT: 38.5 % (ref 36.0–46.0)
Hemoglobin: 12.5 g/dL (ref 12.0–15.0)
Lymphocytes Relative: 5 %
Lymphs Abs: 0.4 10*3/uL — ABNORMAL LOW (ref 0.7–4.0)
MCH: 28.7 pg (ref 26.0–34.0)
MCHC: 32.5 g/dL (ref 30.0–36.0)
MCV: 88.3 fL (ref 78.0–100.0)
Monocytes Absolute: 0.1 10*3/uL (ref 0.1–1.0)
Monocytes Relative: 2 %
Neutro Abs: 6.5 10*3/uL (ref 1.7–7.7)
Neutrophils Relative %: 93 %
Platelets: 363 10*3/uL (ref 150–400)
RBC: 4.36 MIL/uL (ref 3.87–5.11)
RDW: 14.4 % (ref 11.5–15.5)
WBC: 7 10*3/uL (ref 4.0–10.5)

## 2016-12-30 LAB — BASIC METABOLIC PANEL
Anion gap: 9 (ref 5–15)
BUN: 7 mg/dL (ref 6–20)
CO2: 23 mmol/L (ref 22–32)
Calcium: 9.3 mg/dL (ref 8.9–10.3)
Chloride: 103 mmol/L (ref 101–111)
Creatinine, Ser: 0.51 mg/dL (ref 0.44–1.00)
GFR calc Af Amer: >60 mL/min (ref >60)
GFR calc non Af Amer: >60 mL/min (ref >60)
Glucose, Bld: 162 mg/dL — ABNORMAL HIGH (ref 65–99)
Potassium: 4.1 mmol/L (ref 3.5–5.1)
Sodium: 135 mmol/L (ref 135–145)

## 2016-12-30 MED ORDER — ESCITALOPRAM OXALATE 10 MG PO TABS
10.0000 mg | ORAL_TABLET | Freq: Every day | ORAL | Status: DC
  Administered 2016-12-31: 10 mg via ORAL
  Filled 2016-12-30: qty 1

## 2016-12-30 MED ORDER — LEVETIRACETAM 500 MG PO TABS: 1000.0000 mg | ORAL_TABLET | Freq: Two times a day (BID) | ORAL | 3 refills | Status: DC

## 2016-12-30 MED ORDER — DEXAMETHASONE 2 MG PO TABS: 4.0000 mg | ORAL_TABLET | Freq: Two times a day (BID) | ORAL | 0 refills | Status: DC

## 2016-12-30 MED ORDER — SODIUM CHLORIDE 0.9 % IV SOLN
INTRAVENOUS | Status: DC
  Administered 2016-12-31: 06:00:00 via INTRAVENOUS

## 2016-12-30 MED ORDER — LORAZEPAM 1 MG PO TABS
ORAL_TABLET | ORAL | Status: AC
  Filled 2016-12-30: qty 2

## 2016-12-30 MED ORDER — ACETAMINOPHEN 650 MG RE SUPP: 650.0000 mg | Freq: Four times a day (QID) | RECTAL | Status: DC | PRN

## 2016-12-30 MED ORDER — ONDANSETRON HCL 4 MG PO TABS: 4.0000 mg | ORAL_TABLET | Freq: Four times a day (QID) | ORAL | Status: DC | PRN

## 2016-12-30 MED ORDER — ONDANSETRON HCL 4 MG/2ML IJ SOLN: 4.0000 mg | Freq: Four times a day (QID) | INTRAMUSCULAR | Status: DC | PRN

## 2016-12-30 MED ORDER — LEVETIRACETAM 500 MG PO TABS
1000.0000 mg | ORAL_TABLET | Freq: Two times a day (BID) | ORAL | Status: DC
  Administered 2016-12-31: 1000 mg via ORAL
  Filled 2016-12-30: qty 2

## 2016-12-30 MED ORDER — IBUPROFEN 400 MG PO TABS: 200.0000 mg | ORAL_TABLET | Freq: Two times a day (BID) | ORAL | Status: DC | PRN

## 2016-12-30 MED ORDER — ACETAMINOPHEN 325 MG PO TABS: 650.0000 mg | ORAL_TABLET | Freq: Four times a day (QID) | ORAL | Status: DC | PRN

## 2016-12-30 MED ORDER — DEXAMETHASONE 4 MG PO TABS: 4.0000 mg | ORAL_TABLET | Freq: Two times a day (BID) | ORAL | Status: DC

## 2016-12-30 MED ORDER — LORAZEPAM 1 MG PO TABS
2.0000 mg | ORAL_TABLET | Freq: Once | ORAL | Status: AC
  Administered 2016-12-30: 2 mg via ORAL

## 2016-12-30 NOTE — Op Note (Signed)
Stereotactic Radiosurgery Operative Note  Name: Kelly Stuart MRN: [id number]  Date: 12/30/2016  DOB: 1946-02-17  Op Note  Pre Operative Diagnosis:  Multiple brain metastases from metastatic lung cancer  Post Operative Diagnois:  Multiple brain metastases from metastatic lung cancer  3D TREATMENT PLANNING AND DOSIMETRY:  The patient's radiation plan was reviewed and approved by myself (neurosurgery) and Dr. Eppie Gibson (radiation oncology) prior to treatment.  It showed 3-dimensional radiation distributions overlaid onto the planning CT/MRI image set.  The [facility] for the target structures as well as the organs at risk were reviewed. The documentation of the 3D plan and dosimetry are filed in the radiation oncology EMR.  NARRATIVE:  Kelly Stuart was brought to the TrueBeam stereotactic radiation treatment machine and placed supine on the CT couch. The head frame was applied, and the patient was set up for stereotactic radiosurgery.  I was present for the set-up and delivery.  SIMULATION VERIFICATION:  In the couch zero-angle position, the patient underwent Exactrac imaging using the Brainlab system with orthogonal KV images.  These were carefully aligned and repeated to confirm treatment position for each of the isocenters.  The Exactrac snap film verification was repeated at each couch angle.  SPECIAL TREATMENT PROCEDURE: Kelly Stuart received stereotactic radiosurgery to the following targets: Right posterior frontal (35 mm in diameter) complex target was treated with first of 3 fractions of SRS using 4 Rapid Arc VMAT Beams to a prescription dose of 9 Gy for the first of 3 fractions, with a total dose planned for 27 Gy.  ExacTrac registration was performed for each couch angle.  The 100% isodose line was prescribed. Left posterior frontal target was treated using 4 Dynamic Conformal Arcs to a prescription dose of 20 Gy.  ExacTrac registration was performed for each couch angle.  The 100% isodose  line was prescribed. Left parietal target was treated using 4 Dynamic Conformal Arcs to a prescription dose of 20 Gy.  ExacTrac registration was performed for each couch angle.  The 100% isodose line was prescribed.  STEREOTACTIC TREATMENT MANAGEMENT:  Following delivery, the patient was transported to nursing in stable condition and monitored for possible acute effects. LMP 08/23/2008 . The patient tolerated treatment without significant acute effects, and was discharged to home in stable condition.    PLAN: Follow-up in one month.

## 2016-12-30 NOTE — ED Provider Notes (Signed)
[facility] DEPT Provider Note   CSN: [id number] Arrival date & time: 12/30/16  1944     History   Chief Complaint Chief Complaint  Patient presents with  . Gait Problem    cancer pt    HPI Kelly Stuart is a 71 y.o. 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 She was seen and evaluated by Dr. Mickeal Skinner neuro-oncologist earlier today and received her first radiation treatment to the brain. She has been having focal seizures to the left arm as well as weakness for one month, and she began having one during her visit. She was given 2 mg of Ativan with symptom resolution within 10 minutes. She states that she was drowsy after the Ativan was given, but she went home and went to sleep. When she awoke at around 5 PM she noted bilateral lower extremity weakness and felt that she could not walk. She has baseline weakness of the left lower extremity due to long-standing sciatica but feels that both of her legs are equally weaker. She denies numbness, tingling, bowel or bladder incontinence, saddle anesthesia, fevers, slurred speech, or facial droop. She denies lightheadedness or headache. No vision changes. She states that one of the tumors in her brain is very close to her motor cortex. She was also given her first dose of Keppra and decadron prescribed by Dr. Mickeal Skinner earlier today. She denies confusion, SOB, CP, abdominal pain, n/v/d. She endorses mild low back soreness but thinks this is related to 9 days of radiation treatment to the spine. She denies any neck or back pain otherwise. She and her family called Dr. Isidore Moos with radiation oncology who recommended presentation to the ED for evaluation of her symptoms. Oncologist is Dr. Lindi Adie.   The history is provided by the patient.    Past Medical History:  Diagnosis Date  . Anxiety   . Breast cancer ([place])    ER+PR+  HER-2NEU negative    Patient Active Problem List   Diagnosis Date Noted  . 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 Bone metastases ([place]) 12/14/2016  . Brain metastases ([place]) 12/13/2016  . Metastatic lung cancer (metastasis from lung to other site), unspecified laterality ([place]) 11/28/2016  . Lung nodules 11/14/2016  . Primary cancer of upper outer quadrant of left female breast ([place]) 05/05/2011    Past Surgical History:  Procedure Laterality Date  . BREAST LUMPECTOMY WITH NEEDLE LOCALIZATION  02/29/2012   Procedure: BREAST LUMPECTOMY WITH NEEDLE LOCALIZATION;  Surgeon: Rolm Bookbinder, MD;  Location: [place];  Service: General;  Laterality: Left;  . BREAST SURGERY    . COSMETIC SURGERY    . DILATION AND CURETTAGE OF UTERUS  1998   submucus myoma-resected  . EYE SURGERY    . FACELIFT W/BLEPHAROPLASTY  1999      . maxillofacial surgery  1980   mouth-ealign teeth-  . minifacelift  2007  . RADIOACTIVE SEED GUIDED EXCISIONAL BREAST BIOPSY N/A 02/11/2016   Procedure: LEFT BREAST RADIOACTIVE SEED GUIDED EXCISIONAL BIOPSY;  Surgeon: Rolm Bookbinder, MD;  Location: [facility];  Service: General;  Laterality: N/A;  . TONSILLECTOMY      OB History    Gravida Para Term Preterm AB Living   0 0 0 0 0 0   SAB TAB Ectopic Multiple Live Births   0 0 0 0        Obstetric Comments  1 adopted son HRT for years stopped in January 2013 Menses age 68       Home Medications    Prior to Admission medications   Medication Sig Start Date End Date Taking? Authorizing Provider  amLODipine (NORVASC) 5 MG tablet Take 5 mg by mouth daily. 11/04/16  Yes [provider]  dexamethasone (DECADRON) 2 MG tablet Take 2 tablets (4 mg total) by mouth 2 (two) times daily with a meal. 4mg  BIDx 4 days, then 2mg  BID x4 days, then 2mg  daily x4 days 12/30/16  Yes Vaslow, Acey Lav, MD  escitalopram (LEXAPRO) 10 MG tablet Take 10 mg by mouth daily.  06/24/16  Yes [provider]  ibuprofen (ADVIL,MOTRIN) 200 MG tablet Take 200 mg by mouth 2 (two) times daily as needed for headache or moderate pain.   Yes [provider]  levETIRAcetam (KEPPRA) 500 MG tablet Take 2 tablets (1,000 mg total) by mouth 2 (two) times daily. 12/30/16  Yes Vaslow, Acey Lav, MD  LORazepam (ATIVAN) 1 MG tablet Take 1 tablet PO 20-30 min before wearing radiation mask or getting MRIs, as needed for anxiety. 12/23/16  Yes Eppie Gibson, MD  cimetidine (TAGAMET) 200 MG tablet Take 200 mg by mouth daily as needed (acid reflux).    [provider]    Family History Family History  Problem Relation Age of Onset  . Cancer Mother   . Colon cancer Mother   . Cancer Father   . Colon cancer Father   . Hepatitis Father   . Hiatal hernia Father   . Rheumatic fever Sister   . Colon cancer Paternal Aunt     Social History Social History  Substance Use Topics  . Smoking status: Former Smoker    Packs/day: 2.00    Types: Cigarettes    Quit date: 11/01/1991  . Smokeless tobacco: Never Used  . Alcohol use 1.8 oz/week    3 Cans of beer per week     Comment: drinks beer daily     Allergies   Codeine; Penicillins; Sulfa antibiotics; and Tetracyclines & related   Review of Systems Review of Systems  Constitutional: Negative for chills and fever.  Respiratory: Negative for shortness of breath.   Cardiovascular: Negative for chest pain.  Gastrointestinal: Negative for abdominal pain, constipation, diarrhea, nausea and vomiting.  Genitourinary: Negative for dysuria and hematuria.       No bowel/bladder incontinence  Musculoskeletal: Positive for gait problem. Negative for back pain.  Neurological: Positive for weakness. Negative for syncope, facial asymmetry, speech difficulty, light-headedness, numbness and headaches.  Psychiatric/Behavioral: Negative for confusion.  All other systems reviewed and are negative.    Physical Exam Updated Vital Signs BP 119/71  (BP Location: Left Arm)   Pulse 78   Temp (!) 97.5 F (36.4 C) (Oral)   Resp 18   LMP 08/23/2008   SpO2 92%   Physical Exam  Constitutional: She is oriented to person, place, and time. She appears well-developed and well-nourished. No distress.  HENT:  Head: Normocephalic and atraumatic.  Right Ear: External ear normal.  Left Ear: External ear normal.  Mouth/Throat: Oropharynx is clear and moist.  Eyes: Pupils are equal, round, and reactive to light. Conjunctivae and EOM are normal. Right eye exhibits no discharge. Left eye exhibits no discharge.    Visual Acuity  Right Eye Near: R Near: 20/30 Left Eye Near:  L Near: 20/30 Bilateral Near:  20/30 (pt wearing readers)   Neck: Normal range of motion. Neck  supple. No JVD present. No tracheal deviation present.  No midline spine TTP, no paraspinal muscle tenderness, no deformity, crepitus, or step-off noted   Cardiovascular: Normal rate, regular rhythm, normal heart sounds and intact distal pulses.   Pulmonary/Chest: Effort normal. She exhibits no tenderness.  Diffuse scattered rhonchi  Abdominal: Soft. Bowel sounds are normal. She exhibits no distension. There is no tenderness.  Musculoskeletal: She exhibits no edema.  No midline spine TTP, no paraspinal muscle tenderness, no deformity, crepitus, or step-off noted. Left arm 3/5 with distal>proximal impairment.  Left leg 4+/5, right side 5/5 major muscle groups. This is patient's baseline, per pt.    Neurological: She is alert and oriented to person, place, and time. No cranial nerve deficit or sensory deficit.  Mental Status:  Alert, thought content appropriate, able to give a coherent history. Speech fluent without evidence of aphasia. Able to follow 2 step commands without difficulty.  Cranial Nerves:  II:  Peripheral visual fields grossly normal, pupils equal, round, reactive to light III,IV, VI: ptosis not present, extra-ocular motions intact bilaterally  V,VII: smile symmetric,  facial light touch sensation equal VIII: hearing grossly normal to voice  X: uvula elevates symmetrically  XI: bilateral shoulder shrug symmetric and strong XII: midline tongue extension without fassiculations Sensory: light touch normal in all extremities. DTRs: biceps and achilles 2+ symmetric b/l Cerebellar: normal finger-to-nose with bilateral upper extremities, abnormal heel-to-sin test with BLE Gait: ataxic gait and appears off-balance. Unable to heel walk or toe walk. CV: 2+ radial and DP/PT pulses   Skin: Skin is warm and dry. No erythema.  Psychiatric: She has a normal mood and affect. Her behavior is normal.  Nursing note and vitals reviewed.    ED Treatments / Results  Labs (all labs ordered are listed, but only abnormal results are displayed) Labs Reviewed  BASIC METABOLIC PANEL - Abnormal; Notable for the following:       Result Value   Glucose, Bld 162 (*)    All other components within normal limits  CBC WITH DIFFERENTIAL/PLATELET - Abnormal; Notable for the following:    Lymphs Abs 0.4 (*)    All other components within normal limits    EKG  EKG Interpretation None       Radiology Ct Head Wo Contrast  Result Date: 12/30/2016 CLINICAL DATA:  Patient had radiation therapy to the brain today and this evening noticed abnormal gait. Loss of cord nasion in the left leg. History of seizures and lung cancer with brain metastasis. EXAM: CT HEAD WITHOUT CONTRAST TECHNIQUE: Contiguous axial images were obtained from the base of the skull through the vertex without intravenous contrast. COMPARISON:  MRI brain 12/20/2016 FINDINGS: Brain: Mass lesion in the right posterior frontal lobe with central necrosis measuring 3.6 cm diameter. Mild surrounding vasogenic edema. 1.1 cm diameter mass lesion along the left medial parietal cortical gyrus. Both of these lesions can be seen on the previous MRI examination. Additional tiny lesion in the left parietal cortex is not identified  at CT. There is mild effacement of right frontal sulci caused by the mass lesion but without change since the MRI. No midline shift. No abnormal extra-axial fluid collections. Gray-white matter junctions are distinct. Basal cisterns are not effaced. Ventricles are not dilated or effaced. No acute intracranial hemorrhage. Vascular: No hyperdense vessel or unexpected calcification. Skull: Normal. Negative for fracture or focal lesion. Sinuses/Orbits: No acute finding. Other: None. IMPRESSION: Known metastatic lesions identified in the right posterior frontal low measuring 3.6 cm diameter  and in the left medial parietal lobe measuring 1.1 cm diameter. Mild vasogenic edema. No increase in mass effect and no midline shift. No acute intracranial hemorrhage. Electronically Signed   By: Lucienne Capers M.D.   On: 12/30/2016 22:21    Procedures Procedures (including critical care time)  Medications Ordered in ED Medications - No data to display   Initial Impression / Assessment and Plan / ED Course  I have reviewed the triage vital signs and the nursing notes.  Pertinent labs & imaging results that were available during my care of the patient were reviewed by me and considered in my medical decision making (see chart for details).     Patient with lung cancer with bone and brain metastases presents with ataxia after 2 mg dose of Ativan, and newly initiated Keppra and Decadron. Afebrile, vital signs are stable. No saddle anesthesia, no bowel or bladder incontinence, no pain or paresthesias in the back or legs and reflexes are normal. Her strength is at her baseline, however she does have an ataxic gait. Workup is less concerning for cauda equina syndrome for the above reasons. CT of the head shows known metastatic lesions and also shows mild vasogenic edema. There is no increase in mass effect and no midline shift and there is no acute ICH. There is a possibility her symptoms are related to a stroke, however  she is not a candidate for TPA due to her brain metastases. Spoke with neurologist Dr. Leonel Ramsay, who recommends admission into the hospital and MRI in the morning; he does not feel strongly that she should get a full stroke workup. Patient seen and evaluated by Dr. Leonette Monarch, who agrees with assessment and plan at this time.   Spoke with Dr. Alcario Drought with [facility] who agrees that there is no indication for emergent MRI at this time. She will be transferred for [facility] ED for further management and MRI in the AM  Final Clinical Impressions(s) / ED Diagnoses   Final diagnoses:  Ataxia    New Prescriptions New Prescriptions   No medications on file     Debroah Baller 12/31/16 [id number]    Fatima Blank, MD 01/03/17 0110

## 2016-12-30 NOTE — Progress Notes (Signed)
  Radiation Oncology         [phone] ________________________________ Outpatient  Stereotactic Treatment Procedure Note  Name: AMARAH BROSSMAN MRN: [id number]  Date: 12/30/2016  DOB: [birth year]  SPECIAL TREATMENT PROCEDURE  3D TREATMENT PLANNING AND DOSIMETRY:  The patient's radiation plan was reviewed and approved by neurosurgery and radiation oncology prior to treatment.  It showed 3-dimensional radiation distributions overlaid onto the planning CT/MRI image set.  The [facility] for the target structures as well as the organs at risk were reviewed. The documentation of the 3D plan and dosimetry are filed in the radiation oncology EMR.  NARRATIVE:  Aluel R Klausner was brought to the TrueBeam stereotactic radiation treatment machine and placed supine on the CT couch. The head frame was applied, and the patient was set up for stereotactic radiosurgery.  Neurosurgery was present for the set-up and delivery  SIMULATION VERIFICATION:  In the couch zero-angle position, the patient underwent Exactrac imaging using the Brainlab system with orthogonal KV images.  These were carefully aligned and repeated to confirm treatment position for each of the isocenters.  The Exactrac snap film verification was repeated at each couch angle.  SPECIAL TREATMENT PROCEDURE: [name] received stereotactic radiosurgery to the following targets: Right posterior frontal 27mm target was treated using 4 Rapid Arc VMAT Beams to a prescription dose of 9 Gy (first of 3 fractions to eventually total 27 Gy).  ExacTrac Snap verification was performed for each couch angle. Left parietal 62mm target was treated using 4 Dynamic Conformal Arcs to a prescription dose of 20 Gy.  ExacTrac Snap verification was performed for each couch angle. Left parietal 67mm target was treated using  4 Dynamic Conformal Arcs to a prescription dose of 20 Gy.  ExacTrac Snap verification was performed for each couch angle.  This constitutes a special  treatment procedure due to the ablative dose delivered and the technical nature of treatment.  This highly technical modality of treatment ensures that the ablative dose is centered on the patient's tumor while sparing normal tissues from excessive dose and risk of detrimental effects.  STEREOTACTIC TREATMENT MANAGEMENT:  Following delivery, the patient was transported to nursing in stable condition and monitored for possible acute effects.  Vital signs were recorded BP 132/76   Pulse 85   Temp 97.8 F (36.6 C)   LMP 08/23/2008   SpO2 99% Comment: room air. The patient tolerated treatment without significant acute effects, and was discharged to home in stable condition.    PLAN: Follow-up next week for fraction 2. ________________________________   Eppie Gibson, MD

## 2016-12-30 NOTE — H&P (Signed)
History and Physical    Kelly Stuart LSL:[id number] DOB: Sep 12, 1945 DOA: 12/30/2016  PCP: Jonathon Jordan, MD  Patient coming from: Home  I have personally briefly reviewed patient's old medical records in [place]  Chief Complaint: Gait problem  HPI: Kelly Stuart is a 71 y.o. female with medical history significant of BRCA s/p lumpectomy, metastatic lung CA with mets to brain and spine.  Patient just got first radiation treatment today.  0.22m ativan with radiotherapy and started on decadron.  After that she was seen in neuro-oncologist office who noted focal seizures to L arm.  243mativan in office, then started keppra for first time 1gm BID, and decadron.  After all of that patient was "drunk like" per family, difficulty with ambulation.  Brought in to ED.   ED Course: CT head unchanged.  No findings c/w cauda equina.  Patient denies numbness in either leg, saddle anesthesia, bladder retention or incontinence, no pain in either leg.   Review of Systems: As per HPI otherwise 10 point review of systems negative.   Past Medical History:  Diagnosis Date  . Anxiety   . Breast cancer ([facility]   ER+PR+ HER-2NEU negative    Past Surgical History:  Procedure Laterality Date  . BREAST LUMPECTOMY WITH NEEDLE LOCALIZATION  02/29/2012   Procedure: BREAST LUMPECTOMY WITH NEEDLE LOCALIZATION;  Surgeon: MaRolm BookbinderMD;  [facility] Service: General;  Laterality: Left;  . BREAST SURGERY    . COSMETIC SURGERY    . DILATION AND CURETTAGE OF UTERUS  1998   submucus myoma-resected  . EYE SURGERY    . FACELIFT W/BLEPHAROPLASTY  1999      . maxillofacial surgery  1980   mouth-ealign teeth-  . minifacelift  2007  . RADIOACTIVE SEED GUIDED EXCISIONAL BREAST BIOPSY N/A 02/11/2016   Procedure: LEFT BREAST RADIOACTIVE SEED GUIDED EXCISIONAL BIOPSY;  Surgeon: MaRolm BookbinderMD;  Location[facility] Service: General;  Laterality: N/A;    . TONSILLECTOMY       reports that she quit smoking about 25 years ago. Her smoking use included Cigarettes. She smoked 2.00 packs per day. She has never used smokeless tobacco. She reports that she drinks about 1.8 oz of alcohol per week . She reports that she does not use drugs.  Allergies  Allergen Reactions  . Codeine Nausea And Vomiting  . Penicillins Rash    Has patient had a PCN reaction causing immediate rash, facial/tongue/throat swelling, SOB or lightheadedness with hypotension: Yes Has patient had a PCN reaction causing severe rash involving mucus membranes or skin necrosis: No Has patient had a PCN reaction that required hospitalization: Yes Has patient had a PCN reaction occurring within the last 10 years: No If all of the above answers are "NO", then may proceed with Cephalosporin use.   . Sulfa Antibiotics Rash  . Tetracyclines & Related Rash    Patient states Acromycin    Family History  Problem Relation Age of Onset  . Cancer Mother   . Colon cancer Mother   . Cancer Father   . Colon cancer Father   . Hepatitis Father   . Hiatal hernia Father   . Rheumatic fever Sister   . Colon cancer Paternal Aunt      Prior to Admission medications   Medication Sig Start Date End Date Taking? Authorizing Provider  amLODipine (NORVASC) 5 MG tablet Take 5 mg by mouth daily. 11/04/16  Yes [provider]  dexamethasone (DECADRON) 2 MG tablet Take 2 tablets (4 mg total) by mouth 2 (two) times daily with a meal. 20m BIDx 4 days, then 219mBID x4 days, then 61m361maily x4 days 12/30/16  Yes Vaslow, ZacAcey LavD  escitalopram (LEXAPRO) 10 MG tablet Take 10 mg by mouth daily.  06/24/16  Yes [provider]  ibuprofen (ADVIL,MOTRIN) 200 MG tablet Take 200 mg by mouth 2 (two) times daily as needed for headache or moderate pain.   Yes [provider]  levETIRAcetam (KEPPRA) 500 MG tablet Take 2 tablets (1,000 mg total) by mouth 2 (two) times daily. 12/30/16  Yes  Vaslow, ZacAcey LavD  LORazepam (ATIVAN) 1 MG tablet Take 1 tablet PO 20-30 min before wearing radiation mask or getting MRIs, as needed for anxiety. 12/23/16  Yes SquEppie GibsonD    Physical Exam: Vitals:   12/30/16 1959 12/30/16 2041 12/30/16 2308  BP: 133/78 125/76 119/71  Pulse: 81 80 78  Resp: (!) 25 (!) 24 18  Temp: (!) 97.5 F (36.4 C)    TempSrc: Oral    SpO2: 94% 92% 92%    Constitutional: NAD, calm, comfortable Eyes: PERRL, lids and conjunctivae normal ENMT: Mucous membranes are moist. Posterior pharynx clear of any exudate or lesions.Normal dentition.  Neck: normal, supple, no masses, no thyromegaly Respiratory: clear to auscultation bilaterally, no wheezing, no crackles. Normal respiratory effort. No accessory muscle use.  Cardiovascular: Regular rate and rhythm, no murmurs / rubs / gallops. No extremity edema. 2+ pedal pulses. No carotid bruits.  Abdomen: no tenderness, no masses palpated. No hepatosplenomegaly. Bowel sounds positive.  Musculoskeletal: no clubbing / cyanosis. No joint deformity upper and lower extremities. Good ROM, no contractures. Normal muscle tone.  Skin: no rashes, lesions, ulcers. No induration Neurologic: Ataxia, sleepy.  LUE 3/5, LLE 4+/5 this apparently baseline for patient. Psychiatric: Normal judgment and insight. Alert and oriented x 3. Normal mood.    Labs on Admission: I have personally reviewed following labs and imaging studies  CBC:  Recent Labs Lab 12/30/16 2158  WBC 7.0  NEUTROABS 6.5  HGB 12.5  HCT 38.5  MCV 88.3  PLT [id number] Metabolic Panel:  Recent Labs Lab 12/30/16 2158  NA 135  K 4.1  CL 103  CO2 23  GLUCOSE 162*  BUN 7  CREATININE 0.51  CALCIUM 9.3   GFR: Estimated Creatinine Clearance: 68.7 mL/min (by C-G formula based on SCr of 0.51 mg/dL). Liver Function Tests: No results for input(s): AST, ALT, ALKPHOS, BILITOT, PROT, ALBUMIN in the last 168 hours. No results for input(s): LIPASE, AMYLASE in  the last 168 hours. No results for input(s): AMMONIA in the last 168 hours. Coagulation Profile: No results for input(s): INR, PROTIME in the last 168 hours. Cardiac Enzymes: No results for input(s): CKTOTAL, CKMB, CKMBINDEX, TROPONINI in the last 168 hours. BNP (last 3 results) No results for input(s): PROBNP in the last 8760 hours. HbA1C: No results for input(s): HGBA1C in the last 72 hours. CBG: No results for input(s): GLUCAP in the last 168 hours. Lipid Profile: No results for input(s): CHOL, HDL, LDLCALC, TRIG, CHOLHDL, LDLDIRECT in the last 72 hours. Thyroid Function Tests: No results for input(s): TSH, T4TOTAL, FREET4, T3FREE, THYROIDAB in the last 72 hours. Anemia Panel: No results for input(s): VITAMINB12, FOLATE, FERRITIN, TIBC, IRON, RETICCTPCT in the last 72 hours. Urine analysis:    Component Value Date/Time   BILIRUBINUR n 09/11/2014 0903   PROTEINUR n 09/11/2014 [id number]  UROBILINOGEN negative 09/11/2014 0903   NITRITE n 09/11/2014 0903   LEUKOCYTESUR Negative 09/11/2014 0903    Radiological Exams on Admission: Ct Head Wo Contrast  Result Date: 12/30/2016 CLINICAL DATA:  Patient had radiation therapy to the brain today and this evening noticed abnormal gait. Loss of cord nasion in the left leg. History of seizures and lung cancer with brain metastasis. EXAM: CT HEAD WITHOUT CONTRAST TECHNIQUE: Contiguous axial images were obtained from the base of the skull through the vertex without intravenous contrast. COMPARISON:  MRI brain 12/20/2016 FINDINGS: Brain: Mass lesion in the right posterior frontal lobe with central necrosis measuring 3.6 cm diameter. Mild surrounding vasogenic edema. 1.1 cm diameter mass lesion along the left medial parietal cortical gyrus. Both of these lesions can be seen on the previous MRI examination. Additional tiny lesion in the left parietal cortex is not identified at CT. There is mild effacement of right frontal sulci caused by the mass lesion but  without change since the MRI. No midline shift. No abnormal extra-axial fluid collections. Gray-white matter junctions are distinct. Basal cisterns are not effaced. Ventricles are not dilated or effaced. No acute intracranial hemorrhage. Vascular: No hyperdense vessel or unexpected calcification. Skull: Normal. Negative for fracture or focal lesion. Sinuses/Orbits: No acute finding. Other: None. IMPRESSION: Known metastatic lesions identified in the right posterior frontal low measuring 3.6 cm diameter and in the left medial parietal lobe measuring 1.1 cm diameter. Mild vasogenic edema. No increase in mass effect and no midline shift. No acute intracranial hemorrhage. Electronically Signed   By: Lucienne Capers M.D.   On: 12/30/2016 22:21    EKG: Independently reviewed.  Assessment/Plan Principal Problem:   Ataxia Active Problems:   Metastatic lung cancer (metastasis from lung to other site), unspecified laterality ([place])   Brain metastases ([place])   Spine metastasis ([place])    1. Ataxia - 1. Likely medication induced due to ativan dosing today 2. CT head showed no new findings 3. Spoke with Dr. Leonel Ramsay 1. Observe overnight 2. Will get MRI brain, c, and T spine to r/o cord compression though this is felt to be less likely 2. Metastatic lung CA - s/p radiation today 1. IVF: NS at 75 cc/hr 2. Continue decadron 3. Partial seizures - continue keppra  DVT prophylaxis: SCDs - hemorrhagic brain mets Code Status: Full Family Communication: Family at bedside Disposition Plan: Home after admit Consults called: Spoke with Dr. Leonel Ramsay Admission status: Place in [place], [place] [facility] Pager [phone]  If 7AM-7PM, please contact day team taking care of patient [email or web address] Password [email or web address]  12/30/2016, 11:45 PM

## 2016-12-30 NOTE — ED Notes (Signed)
Pt in restroom. Will draw labs upon her return

## 2016-12-30 NOTE — Telephone Encounter (Signed)
Gave patient avs and calendar with upcoming appts.  °

## 2016-12-30 NOTE — Telephone Encounter (Addendum)
Patient and family called on call service this evening and I was connected to them.  She reports sudden b/l leg weakness making it difficult to walk.  Wonders if this is due to Ativan taken this PM or the brain SRS.   Given her spinal metastases I am concerned about spinal cord compression being on the differential.  Advised them to go to [facility] ED - I called Triage nurse to notify of her history.  -----------------------------------  Eppie Gibson, MD

## 2016-12-30 NOTE — Progress Notes (Signed)
[facility] [place], [place] [address] [phone]   New Patient Evaluation  Date of Service: 12/30/16 Patient Name: Kelly Stuart Patient MRN: [id number] Patient DOB: Sep 16, 1945 Provider: Ventura Sellers, MD  Identifying Statement:  Kelly Stuart is a 71 y.o. woman with metastatic lung cancer, Seizures ([place]) [R56.9], who presents for initial consultation and evaluation regarding cancer associated neurologic deficits.     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      05/18/2011 - 03/12/2012 Anti-estrogen oral therapy    Neoadjuvant Arimidex 1 mg daily      02/29/2012 Surgery    Left breast lumpectomy without sentinel lymph node biopsy (patient declined), 1.1 cm well-differentiated IDC ER/PR positive HER-2 negative T1 cN0 M0 stage IA       Radiation Therapy    Declined radiation therapy, also declined antiestrogen therapy      02/11/2016 Surgery    Left Lumpectomy: 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       History of Present Illness:The patient's records from the referring physician were obtained and reviewed and the patient interviewed to confirm this HPI.  Kelly Stuart began experiencing involuntary movements of her  left arm on 11/25/16, following an adrenal biopsy and concurrent with an acute zoster flare.  The movements are described as rhythmic twitching of distal left arm/hand, leading to sustained contraction and closure of the hand.  The events last 1-2 minutes; the first of which was followed by significant weakness of the arm, which has not significantly improved.  There has been occassional spread to her left leg and twitching in the lower face. The frequency has been almost daily, usually in the early morning.  She sleeps alone and is unsure if events are occurring at night.  She denies any loss or alteration of consciousness associated with the events.  The weakness has predominantly affected her left arm, and she has no practical use of her left hand at this time.  There is also sustained mild weakness involving the left leg, causing some ambulatory impairment.  Today she underwent first radiotherapy treatment for her brain metastases.  The largest lesion will require two additional treatments next week.  Next week she will meet with Dr. Lindi Adie to formulate a treatment plan for her lung cancer.     Clinical Event: During visit the patient began to experience rhythmic twitching of the left arm as described in the history.  This activity was sustained beyond 5 minutes and prompted treatment with oral lorazepam 55m one time dose.  Activity did cease after ~10 minutes and treatment.  There was no post-event encephalopathy apart from expected drowsiness from benzodiazepine administration              Current Outpatient Prescriptions on File  Prior to Visit  Medication Sig Dispense Refill  . amLODipine (NORVASC) 5 MG tablet Take 5 mg by mouth daily.    Marland Kitchen escitalopram (LEXAPRO) 10 MG tablet Take 10 mg by mouth daily.     Marland Kitchen ibuprofen (ADVIL,MOTRIN) 200 MG tablet Take 200 mg by mouth 2 (two) times daily as needed for headache or moderate pain.    Marland Kitchen LORazepam (ATIVAN) 1 MG tablet Take 1 tablet PO 20-30 min before  wearing radiation mask or getting MRIs, as needed for anxiety. 5 tablet 0  . cimetidine (TAGAMET) 200 MG tablet Take 200 mg by mouth daily as needed (acid reflux).     No current facility-administered medications on file prior to visit.      Allergies:  Allergies  Allergen Reactions  . Codeine Nausea And Vomiting  . Penicillins Rash    Has patient had a PCN reaction causing immediate rash, facial/tongue/throat swelling, SOB or lightheadedness with hypotension: Yes Has patient had a PCN reaction causing severe rash involving mucus membranes or skin necrosis: No Has patient had a PCN reaction that required hospitalization: Yes Has patient had a PCN reaction occurring within the last 10 years: No If all of the above answers are "NO", then may proceed with Cephalosporin use.   . Sulfa Antibiotics Rash  . Tetracyclines & Related Rash    Patient states Acromycin   Past Medical History:  Past Medical History:  Diagnosis Date  . Anxiety   . Breast cancer ([place])    ER+PR+ HER-2NEU negative   Past Surgical History:  Past Surgical History:  Procedure Laterality Date  . BREAST LUMPECTOMY WITH NEEDLE LOCALIZATION  02/29/2012   Procedure: BREAST LUMPECTOMY WITH NEEDLE LOCALIZATION;  Surgeon: Rolm Bookbinder, MD;  Location: [facility];  Service: General;  Laterality: Left;  . BREAST SURGERY    . COSMETIC SURGERY    . DILATION AND CURETTAGE OF UTERUS  1998   submucus myoma-resected  . EYE SURGERY    . FACELIFT W/BLEPHAROPLASTY  1999      . maxillofacial surgery  1980   mouth-ealign teeth-  . minifacelift  2007  . RADIOACTIVE SEED GUIDED EXCISIONAL BREAST BIOPSY N/A 02/11/2016   Procedure: LEFT BREAST RADIOACTIVE SEED GUIDED EXCISIONAL BIOPSY;  Surgeon: Rolm Bookbinder, MD;  Location: [facility];  Service: General;  Laterality: N/A;  . TONSILLECTOMY     Social History:  Social History   Social History  . Marital status: Divorced    Spouse name: N/A    . Number of children: 1  . Years of education: N/A   Occupational History  .  [employer] Physiological scientist  .      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 Smoking status: Former Smoker    Packs/day: 2.00    Types: Cigarettes    Quit date: 11/01/1991  . Smokeless tobacco: Never Used  . Alcohol use 1.8 oz/week    3 Cans of beer per week     Comment: drinks beer daily  . Drug use: No  . Sexual activity: Yes    Partners: Male   Other Topics Concern  . Not on file   Social History Narrative  . No narrative on file   Family History:  Family History  Problem Relation Age of Onset  . Cancer Mother   . Colon cancer Mother   . Cancer Father   . Colon cancer Father   . Hepatitis Father   .  Hiatal hernia Father   . Rheumatic fever Sister   . Colon cancer Paternal Aunt     Review of Systems: Constitutional: Denies fevers, chills or abnormal weight loss Eyes: Denies blurriness of vision Ears, nose, mouth, throat, and face: Denies mucositis or sore throat Respiratory: Denies cough, dyspnea or wheezes Cardiovascular: Denies palpitation, chest discomfort or lower extremity swelling Gastrointestinal:  Denies nausea, constipation, diarrhea GU: Denies dysuria or incontinence Skin: Denies abnormal skin rashes Neurological: Per HPI Musculoskeletal: Denies joint pain, back or neck discomfort. No decrease in ROM Behavioral/Psych: Denies anxiety, disturbance in thought content, and mood instability   Physical Exam: Vitals:   12/30/16 0949  BP: 125/69  Pulse: 93  Resp: 18  Temp: (!) 97.5 F (36.4 C)  SpO2: 98%   KPS: 70. General: Alert, cooperative, pleasant, in no acute distress Head: Normal EENT: No conjunctival injection or scleral icterus. Oral mucosa moist Lungs: Resp effort normal Cardiac: Regular rate and rhythm Abdomen: Soft, non-distended abdomen Skin: No rashes cyanosis or petechiae. Extremities: No clubbing or  edema  Neurologic Exam: Mental Status: Awake, alert, attentive to examiner. Oriented to self and environment. Language is fluent with intact comprehension.  Cranial Nerves: Visual acuity is grossly normal. Visual fields are full. Extra-ocular movements intact. No ptosis. Face is symmetric, tongue midline. Motor: Tone and bulk normal, left arm 3/5 with distal>proximal impairment.  Left leg 4+/5, right side 5/5.  Reflexes symmetric at this time.  (See clinical event described above.) Sensory: Intact to light touch and temperature Gait: Hemiparetic gait with long tract pattern weakness/circumnavigation of left leg   Labs: I have reviewed the data as listed    Component Value Date/Time   NA 139 11/21/2016 1038   NA 141 02/03/2014 1259   K 4.1 11/21/2016 1038   K 4.2 02/03/2014 1259   CL 107 11/21/2016 1038   CL 106 06/12/2012 1411   CO2 25 11/21/2016 1038   CO2 25 02/03/2014 1259   GLUCOSE 114 (H) 11/21/2016 1038   GLUCOSE 121 02/03/2014 1259   GLUCOSE 163 (H) 06/12/2012 1411   BUN 8 11/21/2016 1038   BUN 13.1 02/03/2014 1259   CREATININE 0.63 11/21/2016 1038   CREATININE 0.8 02/03/2014 1259   CALCIUM 8.8 (L) 11/21/2016 1038   CALCIUM 9.6 02/03/2014 1259   PROT 7.0 11/21/2016 1038   PROT 7.4 02/03/2014 1259   ALBUMIN 3.4 (L) 11/21/2016 1038   ALBUMIN 3.8 02/03/2014 1259   AST 24 11/21/2016 1038   AST 17 02/03/2014 1259   ALT 13 (L) 11/21/2016 1038   ALT 17 02/03/2014 1259   ALKPHOS 87 11/21/2016 1038   ALKPHOS 56 02/03/2014 1259   BILITOT 1.1 11/21/2016 1038   BILITOT 0.59 02/03/2014 1259   GFRNONAA >60 11/21/2016 1038   GFRAA >60 11/21/2016 1038   Lab Results  Component Value Date   WBC 7.4 12/07/2016   NEUTROABS 4.2 12/07/2016   HGB 12.7 12/07/2016   HCT 38.9 12/07/2016   MCV 90.3 12/07/2016   PLT 432 (H) 12/07/2016    Imaging: I have personally reviewed the radiological images as listed and agreed with the findings in the report. 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 Hemorrhagic metastasis secondary to lung cancer. SRS planning. EXAM: MRI HEAD WITHOUT AND WITH CONTRAST TECHNIQUE: Multiplanar, multiecho pulse sequences of the brain and surrounding structures were obtained without and with intravenous contrast. CONTRAST:  60m MULTIHANCE GADOBENATE DIMEGLUMINE 529  MG/ML IV SOLN COMPARISON:  MRI brain 12/03/2016 [facility]. FINDINGS: Brain: A hemorrhagic lesion in the posterior right frontal lobe sixth has increased in size since the prior study. This likely reflects interval hemorrhage there is peripheral enhancement of this lesion which now measures 3.4 x 3.0 x 3.4 cm. A lesion in the medial left parietal lobe is also slightly larger than on the prior exam, now measuring 1.0 x 0.8 x 1.0 cm. Minimal hemorrhage is associated with the left parietal lesion. A 4 mm left parietal lesion is not well seen due to patient motion on the prior exam. There is significant surrounding vasogenic edema with each of these lesions. Other scattered periventricular and subcortical T2 changes bilaterally likely reflect the sequela of chronic microvascular ischemia. No acute infarct is present. The brainstem and cerebellum are within normal limits. Vascular: Flow is present in the major intracranial arteries. Skull and upper cervical spine: The skullbase is within normal limits. The craniocervical junction is normal. Degenerative changes are present the upper cervical spine. No definite osseous lesions are evident. Sinuses/Orbits: The paranasal sinuses and mastoid air cells are clear. The globes and orbits are unremarkable. IMPRESSION: 1. Interval increase in size of posterior right frontal lobe and medial left parietal lesions previously described. This likely reflects interval hemorrhage of the right frontal lesion and possibly in the left parietal lesion. 2. Additional 4 mm  lesion noted in the left parietal lobe on image 112 of series 12. Electronically Signed   By: San Morelle M.D.   On: 12/20/2016 16:25   Mr Jeri Cos OF Contrast  Result Date: 12/03/2016 CLINICAL DATA:  Metastatic lung cancer. History of breast cancer 2013 EXAM: MRI HEAD WITHOUT AND WITH CONTRAST TECHNIQUE: Multiplanar, multiecho pulse sequences of the brain and surrounding structures were obtained without and with intravenous contrast. CONTRAST:  15 mL MultiHance IV COMPARISON:  None. FINDINGS: Brain: Right frontal parietal enhancing mass lesion over the convexity near the motor strip. Lesion shows internal hemorrhage. Enhancing component measures 24 x 20 mm with mild surrounding edema. 6.5 mm enhancing nodule left medial parietal lobe with mild edema consistent with metastatic disease. No other enhancing lesions. Ventricle size is normal. No shift of the midline structures. Negative for acute infarct. Mild chronic ischemic change in the white matter. Vascular: Normal arterial flow voids Skull and upper cervical spine: Negative Sinuses/Orbits: Negative Other: None IMPRESSION: 24 x 20 mm hemorrhagic metastatic deposit in the right frontal parietal lobe over the convexity near the motor strip 6.5 mm enhancing metastatic deposit left medial parietal lobe. Electronically Signed   By: Franchot Gallo M.D.   On: 12/03/2016 14:44   US Biopsy  Result Date: 12/07/2016 INDICATION: 71 year old female with history of breast carcinoma. FDG avid nodule of the left chest wall, with concern for metastases. EXAM: ULTRASOUND-GUIDED BIOPSY LEFT CHEST WALL NODULE MEDICATIONS: None. ANESTHESIA/SEDATION: None FLUOROSCOPY TIME:  None COMPLICATIONS: None PROCEDURE: Informed written consent was obtained from the patient after a thorough discussion of the procedural risks, benefits and alternatives. All questions were addressed. Maximal Sterile Barrier Technique was utilized including caps, mask, sterile gowns, sterile gloves,  sterile drape, hand hygiene and skin antiseptic. A timeout was performed prior to the initiation of the procedure. Patient positioned right decubitus on the ultrasound table. Ultrasound images of left chest wall or performed with images stored and sent to PACs. The patient was then prepped and draped in the usual sterile fashion. The skin and subcutaneous tissues were generously infiltrated 1% lidocaine for  local anesthesia. Using ultrasound guidance, multiple 18 gauge core biopsy of chest wall lesion or acquired. These are placed into formalin solution. Final images were stored. Patient tolerated the procedure well and remained hemodynamically stable throughout. No complications were encountered and no significant blood loss. IMPRESSION: Status post ultrasound-guided biopsy of left chest wall lesion. Tissue specimen sent to pathology for complete histopathologic analysis. Signed, Dulcy Fanny. Earleen Newport, DO [facility] Electronically Signed   By: Corrie Mckusick D.O.   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)  We appreciate the opportunity to participate in the care of [place].   Ms. Chevere had a stereotypic event during the visit today which was consistent with a focal motor seizure.  These seizures are structurally related to the metastasis overlying the primary motor cortex within the right frontal lobe.  Today's event was atypical given the duration and temporal occurrence (typically they are early AM), but this was not surprising given the lesional exposure to radiotherapy just hours earlier.   Of additional  concern is her left hemiparesis with functional impairment of the left hand.  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  Certainly the lesion of interest could be the underlying etiology, but it is also possible that she is having more frequent subclinical electrographic events, preventing a resolution of post-ictal Todd's paralysis.  Her response over time to anti-epileptics and radiosurgery will help determine her potential for functional improvement.   Today we administered one time dose of 13m PO Lorazepam to treat her prolonged focal seizure, likely provoked by todays treatment.  We ordered 10028mof Keppra BID which should be started ASAP.  In addition, a 12 day schedule/taper of dexamethasone was ordered, which will start at 110m49mID for first 4 days of treatment.  The steroid will help support and quiet the lesion while she completes radiotherapy schedule in the next few days.         Given the acute nature of today's intervention, the patient/family will have low threshold to call with any issues.  I will reach out to her via phone next Friday 01/06/17 to check on her progress.  I would then like to see her back in clinic in 1 month so that we further titrate the AED regimen.  In addition, referral for physical therapy was provided to help rehabilitate her motor impairments.  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 SquIsidore Moosnd Dr. NudSherwood GamblerWe spent twenty additional minutes teaching regarding the natural history, biology, and historical experience in the treatment of neurologic complications of cancer. We also provided teaching sheets for the patient to take home as an additional resource.  All questions were answered. The patient knows to call the clinic with any problems, questions or concerns. No  barriers to learning were detected.  I spent 60 minutes counseling the patient face to face. The total time spent in the appointment was 60 minutes and more than 50% was on counseling and review of test results  Ventura Sellers, MD Medical Director of Neuro-Oncology [facility] [place] 12/30/16 1:53 PM

## 2016-12-30 NOTE — ED Triage Notes (Signed)
Pt had brain radiation today and this evening she noticed that her gait is abnormal. She states "the doctor said that the radiation was right over the mobility part of my brain." She reports losing coordination in her L leg. Denies numbness, pain or tingling. She reports hx seizures and lung cancer with mets to spinal cord and brain. A&Ox4. VSS.

## 2016-12-31 ENCOUNTER — Observation Stay (HOSPITAL_COMMUNITY): Payer: Medicare Other

## 2016-12-31 DIAGNOSIS — S199XXA Unspecified injury of neck, initial encounter: Secondary | ICD-10-CM | POA: Diagnosis not present

## 2016-12-31 DIAGNOSIS — C799 Secondary malignant neoplasm of unspecified site: Secondary | ICD-10-CM | POA: Diagnosis not present

## 2016-12-31 DIAGNOSIS — R270 Ataxia, unspecified: Secondary | ICD-10-CM | POA: Diagnosis not present

## 2016-12-31 MED ORDER — DEXAMETHASONE 4 MG PO TABS: 2.0000 mg | ORAL_TABLET | Freq: Every day | ORAL | Status: DC

## 2016-12-31 MED ORDER — DEXAMETHASONE 4 MG PO TABS: 2.0000 mg | ORAL_TABLET | Freq: Two times a day (BID) | ORAL | Status: DC

## 2016-12-31 MED ORDER — DEXAMETHASONE 4 MG PO TABS
4.0000 mg | ORAL_TABLET | Freq: Two times a day (BID) | ORAL | Status: DC
  Administered 2016-12-31: 4 mg via ORAL
  Filled 2016-12-31: qty 1

## 2016-12-31 NOTE — ED Notes (Signed)
[facility] called. 

## 2016-12-31 NOTE — Progress Notes (Signed)
Patient discharge home, reviewed discharge instruction patient verbalized understanding using teach back. IV removed, escorted out in [place]

## 2016-12-31 NOTE — Progress Notes (Signed)
Patient received from [place] is alert oriented. Vital signs are stable. Skin check done with another nurse found intact.patient given instruction about call light phone and unit routine. Bed in low position and side rail up x2.

## 2016-12-31 NOTE — Discharge Planning (Signed)
Physician Discharge Summary  Kelly Stuart  PIR:[id number]  DOB: Sep 15, 1945  DOA: 12/30/2016 PCP: Jonathon Jordan, MD  Admit date: 12/30/2016 Discharge date: 12/31/2016  Admitted From: Home  Disposition: Home   Recommendations for Outpatient Follow-up:  1. Follow up with PCP in 1 weeks  Discharge Condition: Stable  CODE STATUS: Full  Diet recommendation: Heart Healthy   Brief/Interim Summary: Kelly Stuart is a 71 year old female with medical history significant for, HTN, BRCA status post lumpectomy, metastatic lung cancer with metastases to brain and spine. Presented to the emergency department with bilateral weakness. Patient was seen and the neuro-oncology office where she was noted to have focal seizure of the left arm which she was given 2 mg Ativan and subsequently started on Keppra 1G twice a day and Decadron. After receiving the medication patient felt drunk like with difficult ambulation and was brought to the Patient was placed on observation for MRI evaluation as per discussion with neurology to rule out spinal cord compression.MRI of the brain, C and T spine did not showed any acute changes. Patient is back to her normal and ambulating with no issues. Patient feels mentally at baseline, and no further complaints at this moment. Patient will be discharged home to follow-up with primary care doctor.  Subjective: Patient seen and examined with sister at bedside.  She has no complaints this morning, denies weakness, dizziness or chest pain and palpitation.Tolerating diet well, ambulated with help or issues.  Discharge Diagnoses/Hospital Course:  Ataxia - secondary to medication side effect.  Patient was given 44m of Ativan and Keppra 1G which are very sedating agents.  MRI brain, C and T spine with no acute issues or cord compression  Patient has returned to baseline   Metastatic lung CA S/p radiation 9/7 Continue decadron   Partial seizures  Continue Keppra Left arm weakness  - PT has been arranged by PCP   HTN  BP stable during hospital stay  No changes in medications were made   All other chronic medical condition were stable during the hospitalization.  On the day of the discharge the patient's vitals were stable, and no other acute medical condition were reported by patient. Patient was felt safe to be discharge to home   Discharge Instructions  You were cared for by a hospitalist during your hospital stay. If you have any questions about your discharge medications or the care you received while you were in the hospital after you are discharged, you can call the unit and asked to speak with the hospitalist on call if the hospitalist that took care of you is not available. Once you are discharged, your primary care physician will handle any further medical issues. Please note that NO REFILLS for any discharge medications will be authorized once you are discharged, as it is imperative that you return to your primary care physician (or establish a relationship with a primary care physician if you do not have one) for your aftercare needs so that they can reassess your need for medications and monitor your lab values.  Discharge Instructions    Call MD for:  difficulty breathing, headache or visual disturbances    Complete by:  As directed    Call MD for:  extreme fatigue    Complete by:  As directed    Call MD for:  hives    Complete by:  As directed    Call MD for:  persistant dizziness or light-headedness    Complete by:  As directed  Call MD for:  persistant nausea and vomiting    Complete by:  As directed    Call MD for:  redness, tenderness, or signs of infection (pain, swelling, redness, odor or green/yellow discharge around incision site)    Complete by:  As directed    Call MD for:  severe uncontrolled pain    Complete by:  As directed    Call MD for:  temperature >100.4    Complete by:  As directed    Diet - low sodium heart healthy    Complete  by:  As directed    Increase activity slowly    Complete by:  As directed      Allergies as of 12/31/2016      Reactions   Codeine Nausea And Vomiting   Penicillins Rash   Has patient had a PCN reaction causing immediate rash, facial/tongue/throat swelling, SOB or lightheadedness with hypotension: Yes Has patient had a PCN reaction causing severe rash involving mucus membranes or skin necrosis: No Has patient had a PCN reaction that required hospitalization: Yes Has patient had a PCN reaction occurring within the last 10 years: No If all of the above answers are "NO", then may proceed with Cephalosporin use.   Sulfa Antibiotics Rash   Tetracyclines & Related Rash   Patient states Acromycin      Medication List    STOP taking these medications   LORazepam 1 MG tablet Commonly known as:  ATIVAN     TAKE these medications   amLODipine 5 MG tablet Commonly known as:  NORVASC Take 5 mg by mouth daily.   dexamethasone 2 MG tablet Commonly known as:  DECADRON Take 2 tablets (4 mg total) by mouth 2 (two) times daily with a meal. '4mg'$  BIDx 4 days, then '2mg'$  BID x4 days, then '2mg'$  daily x4 days   escitalopram 10 MG tablet Commonly known as:  LEXAPRO Take 10 mg by mouth daily.   ibuprofen 200 MG tablet Commonly known as:  ADVIL,MOTRIN Take 200 mg by mouth 2 (two) times daily as needed for headache or moderate pain.   levETIRAcetam 500 MG tablet Commonly known as:  KEPPRA Take 2 tablets (1,000 mg total) by mouth 2 (two) times daily.            Discharge Care Instructions        Start     Ordered   12/31/16 0000  Increase activity slowly     12/31/16 1326   12/31/16 0000  Diet - low sodium heart healthy     12/31/16 1326   12/31/16 0000  Call MD for:  temperature >100.4     12/31/16 1326   12/31/16 0000  Call MD for:  persistant nausea and vomiting     12/31/16 1326   12/31/16 0000  Call MD for:  severe uncontrolled pain     12/31/16 1326   12/31/16 0000  Call MD  for:  redness, tenderness, or signs of infection (pain, swelling, redness, odor or green/yellow discharge around incision site)     12/31/16 1326   12/31/16 0000  Call MD for:  difficulty breathing, headache or visual disturbances     12/31/16 1326   12/31/16 0000  Call MD for:  hives     12/31/16 1326   12/31/16 0000  Call MD for:  persistant dizziness or light-headedness     12/31/16 1326   12/31/16 0000  Call MD for:  extreme fatigue     12/31/16 1326  Follow-up Information    Jonathon Jordan, MD. Schedule an appointment as soon as possible for a visit in 1 week(s).   Specialty:  Family Medicine Why:  Hospital follow up  Contact information: [address] [place] [address] [phone]          Allergies  Allergen Reactions  . Codeine Nausea And Vomiting  . Penicillins Rash    Has patient had a PCN reaction causing immediate rash, facial/tongue/throat swelling, SOB or lightheadedness with hypotension: Yes Has patient had a PCN reaction causing severe rash involving mucus membranes or skin necrosis: No Has patient had a PCN reaction that required hospitalization: Yes Has patient had a PCN reaction occurring within the last 10 years: No If all of the above answers are "NO", then may proceed with Cephalosporin use.   . Sulfa Antibiotics Rash  . Tetracyclines & Related Rash    Patient states Acromycin    Consultations:  None    Procedures/Studies: Ct Head Wo Contrast  Result Date: 12/30/2016 CLINICAL DATA:  Patient had radiation therapy to the brain today and this evening noticed abnormal gait. Loss of cord nasion in the left leg. History of seizures and lung cancer with brain metastasis. EXAM: CT HEAD WITHOUT CONTRAST TECHNIQUE: Contiguous axial images were obtained from the base of the skull through the vertex without intravenous contrast. COMPARISON:  MRI brain 12/20/2016 FINDINGS: Brain: Mass lesion in the right posterior frontal lobe with  central necrosis measuring 3.6 cm diameter. Mild surrounding vasogenic edema. 1.1 cm diameter mass lesion along the left medial parietal cortical gyrus. Both of these lesions can be seen on the previous MRI examination. Additional tiny lesion in the left parietal cortex is not identified at CT. There is mild effacement of right frontal sulci caused by the mass lesion but without change since the MRI. No midline shift. No abnormal extra-axial fluid collections. Gray-white matter junctions are distinct. Basal cisterns are not effaced. Ventricles are not dilated or effaced. No acute intracranial hemorrhage. Vascular: No hyperdense vessel or unexpected calcification. Skull: Normal. Negative for fracture or focal lesion. Sinuses/Orbits: No acute finding. Other: None. IMPRESSION: Known metastatic lesions identified in the right posterior frontal low measuring 3.6 cm diameter and in the left medial parietal lobe measuring 1.1 cm diameter. Mild vasogenic edema. No increase in mass effect and no midline shift. No acute intracranial hemorrhage. Electronically Signed   By: Lucienne Capers M.D.   On: 12/30/2016 22:21   Mr Jeri Cos VC Contrast  Result Date: 12/31/2016 CLINICAL DATA:  Ataxia. Rule out stroke. History of metastatic disease EXAM: MRI HEAD WITHOUT AND WITH CONTRAST MRI CERVICAL SPINE WITHOUT AND WITH CONTRAST TECHNIQUE: Multiplanar, multiecho pulse sequences of the brain and surrounding structures, and cervical spine, to include the craniocervical junction and cervicothoracic junction, were obtained without and with intravenous contrast. CONTRAST:  15 mL MultiHance IV COMPARISON:  MRI head 12/20/2016 FINDINGS: MRI HEAD FINDINGS Brain: Image quality degraded by motion Large enhancing metastatic deposit in the right parietal lobe shows interval growth now measuring 37 x 38 mm, compared with 30 x 34 mm previously. Mild surrounding edema unchanged. Moderate intra tumor hemorrhage unchanged. Hemorrhagic metastatic  deposit left medial parietal lobe mildly larger now measuring 12 mm compared with 10.5 x 8.3 mm previously. Mild intra tumor hemorrhage unchanged. 4 mm left parietal cortical metastatic disease unchanged. Negative for hydrocephalus or midline shift. Mild mass-effect on the right ventricle due to the right parietal tumor. No acute ischemic infarction. Vascular: Normal  arterial flow void Skull and upper cervical spine: Negative Sinuses/Orbits: Negative Other: None MRI CERVICAL SPINE FINDINGS Image quality degraded by significant motion. Alignment: Normal alignment.  Mild kyphosis Vertebrae: Negative for fracture or metastatic disease in the cervical spine Cord: Normal cord signal.  No enhancing lesion in the cord Posterior Fossa, vertebral arteries, paraspinal tissues: Negative Disc levels: Disc degeneration and spondylosis at C4-5, C5-6, and C6-7 causing foraminal narrowing bilaterally. No cord compression. IMPRESSION: Progression of metastatic disease in the parietal lobe bilaterally. Intra tumor hemorrhage is present as noted previously. No new metastatic disease in the brain. No acute infarct Negative for cervical spine metastatic disease.  No cord compression Image quality degraded by motion. Electronically Signed   By: Franchot Gallo M.D.   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 Hemorrhagic metastasis secondary to lung cancer. SRS planning. EXAM: MRI HEAD WITHOUT AND WITH CONTRAST TECHNIQUE: Multiplanar, multiecho pulse sequences of the brain and surrounding structures were obtained without and with intravenous contrast. CONTRAST:  92m MULTIHANCE GADOBENATE DIMEGLUMINE 529 MG/ML IV SOLN COMPARISON:  [facility] FINDINGS: Brain: A hemorrhagic lesion in the posterior right frontal lobe sixth has increased in size since the prior study. This likely reflects interval  hemorrhage there is peripheral enhancement of this lesion which now measures 3.4 x 3.0 x 3.4 cm. A lesion in the medial left parietal lobe is also slightly larger than on the prior exam, now measuring 1.0 x 0.8 x 1.0 cm. Minimal hemorrhage is associated with the left parietal lesion. A 4 mm left parietal lesion is not well seen due to patient motion on the prior exam. There is significant surrounding vasogenic edema with each of these lesions. Other scattered periventricular and subcortical T2 changes bilaterally likely reflect the sequela of chronic microvascular ischemia. No acute infarct is present. The brainstem and cerebellum are within normal limits. Vascular: Flow is present in the major intracranial arteries. Skull and upper cervical spine: The skullbase is within normal limits. The craniocervical junction is normal. Degenerative changes are present the upper cervical spine. No definite osseous lesions are evident. Sinuses/Orbits: The paranasal sinuses and mastoid air cells are clear. The globes and orbits are unremarkable. IMPRESSION: 1. Interval increase in size of posterior right frontal lobe and medial left parietal lesions previously described. This likely reflects interval hemorrhage of the right frontal lesion and possibly in the left parietal lesion. 2. Additional 4 mm lesion noted in the left parietal lobe on image 112 of series 12. Electronically Signed   By: CSan MorelleM.D.   On: 12/20/2016 16:25   Mr BJeri CosWRNContrast  Result Date: 12/03/2016 CLINICAL DATA:  Metastatic lung cancer. History of breast cancer 2013 EXAM: MRI HEAD WITHOUT AND WITH CONTRAST TECHNIQUE: Multiplanar, multiecho pulse sequences of the brain and surrounding structures were obtained without and with intravenous contrast. CONTRAST:  15 mL MultiHance IV COMPARISON:  None. FINDINGS: Brain: Right frontal parietal enhancing mass lesion over the convexity near the motor strip. Lesion shows internal hemorrhage.  Enhancing component measures 24 x 20 mm with mild surrounding edema. 6.5 mm enhancing nodule left medial parietal lobe with mild edema consistent with metastatic disease. No other enhancing lesions. Ventricle size is normal. No shift of the midline structures. Negative for acute infarct. Mild chronic ischemic change in the white matter. Vascular: Normal arterial flow voids Skull and upper cervical  spine: Negative Sinuses/Orbits: Negative Other: None IMPRESSION: 24 x 20 mm hemorrhagic metastatic deposit in the right frontal parietal lobe over the convexity near the motor strip 6.5 mm enhancing metastatic deposit left medial parietal lobe. Electronically Signed   By: Franchot Gallo M.D.   On: 12/03/2016 14:44   Mr Cervical Spine W Wo Contrast  Result Date: 12/31/2016 CLINICAL DATA:  Ataxia. Rule out stroke. History of metastatic disease EXAM: MRI HEAD WITHOUT AND WITH CONTRAST MRI CERVICAL SPINE WITHOUT AND WITH CONTRAST TECHNIQUE: Multiplanar, multiecho pulse sequences of the brain and surrounding structures, and cervical spine, to include the craniocervical junction and cervicothoracic junction, were obtained without and with intravenous contrast. CONTRAST:  15 mL MultiHance IV COMPARISON:  MRI head 12/20/2016 FINDINGS: MRI HEAD FINDINGS Brain: Image quality degraded by motion Large enhancing metastatic deposit in the right parietal lobe shows interval growth now measuring 37 x 38 mm, compared with 30 x 34 mm previously. Mild surrounding edema unchanged. Moderate intra tumor hemorrhage unchanged. Hemorrhagic metastatic deposit left medial parietal lobe mildly larger now measuring 12 mm compared with 10.5 x 8.3 mm previously. Mild intra tumor hemorrhage unchanged. 4 mm left parietal cortical metastatic disease unchanged. Negative for hydrocephalus or midline shift. Mild mass-effect on the right ventricle due to the right parietal tumor. No acute ischemic infarction. Vascular: Normal arterial flow void Skull and  upper cervical spine: Negative Sinuses/Orbits: Negative Other: None MRI CERVICAL SPINE FINDINGS Image quality degraded by significant motion. Alignment: Normal alignment.  Mild kyphosis Vertebrae: Negative for fracture or metastatic disease in the cervical spine Cord: Normal cord signal.  No enhancing lesion in the cord Posterior Fossa, vertebral arteries, paraspinal tissues: Negative Disc levels: Disc degeneration and spondylosis at C4-5, C5-6, and C6-7 causing foraminal narrowing bilaterally. No cord compression. IMPRESSION: Progression of metastatic disease in the parietal lobe bilaterally. Intra tumor hemorrhage is present as noted previously. No new metastatic disease in the brain. No acute infarct Negative for cervical spine metastatic disease.  No cord compression Image quality degraded by motion. Electronically Signed   By: Franchot Gallo M.D.   On: 12/31/2016 10:53   Mr Thoracic Spine W Wo Contrast  Result Date: 12/31/2016 CLINICAL DATA:  Metastatic disease.  Ataxia.  Pain EXAM: MRI THORACIC WITHOUT AND WITH CONTRAST TECHNIQUE: Multiplanar and multiecho pulse sequences of the thoracic spine were obtained without and with intravenous contrast. CONTRAST:  15 mL MultiHance IV COMPARISON:  CT chest 11/02/2016 FINDINGS: MRI THORACIC SPINE FINDINGS Image quality degraded by significant motion. Alignment:  Normal Vertebrae: Negative for vertebral body fracture. Bony metastatic disease is present however small lesions could be missed given the amount of motion. Metastatic disease in the left pedicles of T6 and T7 as well as in the left seventh rib with associated soft tissue mass. Metastatic disease in the posterior elements of T12 with mild posterior epidural tumor. Epidural tumor is causing mild spinal stenosis and displacement of the cord anteriorly. Tumor extends into the right pedicle and vertebral body of T12 Cord:  Negative for cord compression.  Cord signal normal. Paraspinal and other soft tissues: Bony  metastatic disease as above. Right lower lobe lung mass as noted on prior CT. Disc levels: Negative for disc degeneration or disc protrusion. IMPRESSION: Bony metastatic disease as above. 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 Negative for cord compression.  No pathologic fracture. Electronically Signed   By: Franchot Gallo M.D.   On: 12/31/2016 10:39   US Biopsy  Result Date: 12/07/2016 INDICATION:  71 year old female with history of breast carcinoma. FDG avid nodule of the left chest wall, with concern for metastases. EXAM: ULTRASOUND-GUIDED BIOPSY LEFT CHEST WALL NODULE MEDICATIONS: None. ANESTHESIA/SEDATION: None FLUOROSCOPY TIME:  None COMPLICATIONS: None PROCEDURE: Informed written consent was obtained from the patient after a thorough discussion of the procedural risks, benefits and alternatives. All questions were addressed. Maximal Sterile Barrier Technique was utilized including caps, mask, sterile gowns, sterile gloves, sterile drape, hand hygiene and skin antiseptic. A timeout was performed prior to the initiation of the procedure. Patient positioned right decubitus on the ultrasound table. Ultrasound images of left chest wall or performed with images stored and sent to PACs. The patient was then prepped and draped in the usual sterile fashion. The skin and subcutaneous tissues were generously infiltrated 1% lidocaine for local anesthesia. Using ultrasound guidance, multiple 18 gauge core biopsy of chest wall lesion or acquired. These are placed into formalin solution. Final images were stored. Patient tolerated the procedure well and remained hemodynamically stable throughout. No complications were encountered and no significant blood loss. IMPRESSION: Status post ultrasound-guided biopsy of left chest wall lesion. Tissue specimen sent to pathology for complete histopathologic analysis. Signed, Dulcy Fanny. Earleen Newport, DO [facility]  Radiology Electronically Signed   By: Corrie Mckusick D.O.   On: 12/07/2016 16:37    Discharge Exam: Vitals:   12/31/16 0400 12/31/16 0521  BP: 103/69 126/63  Pulse: 77 92  Resp: 20 17  Temp:  97.9 F (36.6 C)  SpO2: 95% 94%   Vitals:   12/31/16 0300 12/31/16 0330 12/31/16 0400 12/31/16 0521  BP: 105/70 109/75 103/69 126/63  Pulse: 79 75 77 92  Resp: _0 Temp:    97.9 F (36.6 C)  TempSrc:    Oral  SpO2: 92% 94% 95% 94%  Weight:    76.7 kg (169 lb 1.6 oz)  Height:    _1  (1.778 m)    General: Pt is alert, awake, not in acute distress Cardiovascular: RRR, S1/S2 +, no rubs, no gallops Respiratory: CTA bilaterally, no wheezing, no rhonchi Abdominal: Soft, NT, ND, bowel sounds + Extremities: no edema, no cyanosis, strength 5/5 in all 4 extremities  The results of significant diagnostics from this hospitalization (including imaging, microbiology, ancillary and laboratory) are listed below for reference.     Microbiology: No results found for this or any previous visit (from the past 240 hour(s)).   Labs: BNP (last 3 results) No results for input(s): BNP in the last 8760 hours. Basic Metabolic Panel:  Recent Labs Lab 12/30/16 2158  NA 135  K 4.1  CL 103  CO2 23  GLUCOSE 162*  BUN 7  CREATININE 0.51  CALCIUM 9.3   Liver Function Tests: No results for input(s): AST, ALT, ALKPHOS, BILITOT, PROT, ALBUMIN in the last 168 hours. No results for input(s): LIPASE, AMYLASE in the last 168 hours. No results for input(s): AMMONIA in the last 168 hours. CBC:  Recent Labs Lab 12/30/16 2158  WBC 7.0  NEUTROABS 6.5  HGB 12.5  HCT 38.5  MCV 88.3  PLT 363   Cardiac Enzymes: No results for input(s): CKTOTAL, CKMB, CKMBINDEX, TROPONINI in the last 168 hours. BNP: Invalid input(s): POCBNP CBG: No results for input(s): GLUCAP in the last 168 hours. D-Dimer No results for input(s): DDIMER in the last 72 hours. Hgb A1c No results for input(s): HGBA1C in the  last 72 hours. Lipid Profile No results for input(s): CHOL, HDL, LDLCALC,  TRIG, CHOLHDL, LDLDIRECT in the last 72 hours. Thyroid function studies No results for input(s): TSH, T4TOTAL, T3FREE, THYROIDAB in the last 72 hours.  Invalid input(s): FREET3 Anemia work up No results for input(s): VITAMINB12, FOLATE, FERRITIN, TIBC, IRON, RETICCTPCT in the last 72 hours. Urinalysis    Component Value Date/Time   BILIRUBINUR n 09/11/2014 0903   PROTEINUR n 09/11/2014 0903   UROBILINOGEN negative 09/11/2014 0903   NITRITE n 09/11/2014 0903   LEUKOCYTESUR Negative 09/11/2014 0903   Sepsis Labs Invalid input(s): PROCALCITONIN,  WBC,  LACTICIDVEN Microbiology No results found for this or any previous visit (from the past 240 hour(s)).   Time coordinating discharge: 25 minutes  SIGNED:  Chipper Oman, MD  [facility] 12/31/2016, 1:27 PM  Pager please text page via  [email or web address] Password [email or web address]

## 2017-01-02 ENCOUNTER — Ambulatory Visit
Admission: RE | Admit: 2017-01-02 | Discharge: 2017-01-02 | Disposition: A | Discharge status: Home / Self Care | Payer: Medicare Other | Source: Ambulatory Visit | Attending: Radiation Oncology | Admitting: Radiation Oncology

## 2017-01-02 ENCOUNTER — Telehealth: Payer: Self-pay | Admitting: Internal Medicine

## 2017-01-02 DIAGNOSIS — M546 Pain in thoracic spine: Secondary | ICD-10-CM | POA: Diagnosis not present

## 2017-01-02 DIAGNOSIS — C3490 Malignant neoplasm of unspecified part of unspecified bronchus or lung: Secondary | ICD-10-CM | POA: Diagnosis not present

## 2017-01-02 DIAGNOSIS — Z510 Encounter for antineoplastic radiation therapy: Secondary | ICD-10-CM | POA: Diagnosis not present

## 2017-01-02 DIAGNOSIS — C7951 Secondary malignant neoplasm of bone: Secondary | ICD-10-CM | POA: Diagnosis not present

## 2017-01-02 DIAGNOSIS — C7931 Secondary malignant neoplasm of brain: Secondary | ICD-10-CM | POA: Diagnosis not present

## 2017-01-02 DIAGNOSIS — Z853 Personal history of malignant neoplasm of breast: Secondary | ICD-10-CM | POA: Diagnosis not present

## 2017-01-02 NOTE — Progress Notes (Signed)
  Radiation Oncology         [phone] ________________________________ Outpatient  Stereotactic Treatment Procedure Note  Name: Kelly Stuart MRN: [id number]  Date: 01/02/2017  DOB: [birth year]  SPECIAL TREATMENT PROCEDURE    ICD-10-CM   1. Brain metastases ([place]) C79.31     3D TREATMENT PLANNING AND DOSIMETRY:  The patient's radiation plan was reviewed and approved by neurosurgery and radiation oncology prior to treatment.  It showed 3-dimensional radiation distributions overlaid onto the planning CT/MRI image set.  The [facility] for the target structures as well as the organs at risk were reviewed. The documentation of the 3D plan and dosimetry are filed in the radiation oncology EMR.  NARRATIVE:  Kelly Stuart was brought to the TrueBeam stereotactic radiation treatment machine and placed supine on the CT couch. The head frame was applied, and the patient was set up for stereotactic radiosurgery.  Neurosurgery was present for the set-up and delivery  SIMULATION VERIFICATION:  In the couch zero-angle position, the patient underwent Exactrac imaging using the Brainlab system with orthogonal KV images.  These were carefully aligned and repeated to confirm treatment position for each of the isocenters.  The Exactrac snap film verification was repeated at each couch angle.  SPECIAL TREATMENT PROCEDURE: Kelly Stuart received stereotactic radiosurgery to the following targets: Right posterior frontal 20mm target was treated using 4 Rapid Arc VMAT Beams to a prescription dose of 9 Gy (2nd of 3 fractions to eventually total 27 Gy).  ExacTrac Snap verification was performed for each couch angle.   This constitutes a special treatment procedure due to the ablative dose delivered and the technical nature of treatment.  This highly technical modality of treatment ensures that the ablative dose is centered on the patient's tumor while sparing normal tissues from excessive dose and risk of detrimental  effects.  STEREOTACTIC TREATMENT MANAGEMENT:  Following delivery, the patient was transported to nursing in stable condition and monitored for possible acute effects.  The patient tolerated treatment without significant acute effects, and was discharged to home in stable condition.    PLAN: Follow-up in 2 days for fraction 3 ________________________________   Eppie Gibson, MD

## 2017-01-02 NOTE — Telephone Encounter (Signed)
Spoke to Kelly Stuart today regarding her side effects from new Keppra medication.  She has been feeling a little drowsy and imbalanced 1 hour after taking the 1000mg  dose in the morning.  However, her seizures have stopped and she has noticed improved motor function in her left arm.    I recommended she stay on the 1000mg  BID dose level for an additional week, given her 2 SRS treatments scheduled this week.  Her improvement following AED administration suggests that her motor deficits have a post-ictal component which is theoretically reversible.    I will call her next Monday to re-assess; if still poorly tolerated but seizure-free we will consider decreasing dose to 500mg  BID and allowing steroid taper to complete.    Ventura Sellers, MD

## 2017-01-03 ENCOUNTER — Encounter: Payer: Self-pay | Admitting: *Deleted

## 2017-01-04 ENCOUNTER — Ambulatory Visit
Admission: RE | Admit: 2017-01-04 | Discharge: 2017-01-04 | Disposition: A | Discharge status: Home / Self Care | Payer: Medicare Other | Source: Ambulatory Visit | Attending: Radiation Oncology | Admitting: Radiation Oncology

## 2017-01-04 VITALS — BP 121/84 | HR 86 | Temp 98.1°F | Resp 20

## 2017-01-04 DIAGNOSIS — C7931 Secondary malignant neoplasm of brain: Secondary | ICD-10-CM

## 2017-01-04 DIAGNOSIS — C3490 Malignant neoplasm of unspecified part of unspecified bronchus or lung: Secondary | ICD-10-CM | POA: Diagnosis not present

## 2017-01-04 DIAGNOSIS — C7951 Secondary malignant neoplasm of bone: Secondary | ICD-10-CM | POA: Diagnosis not present

## 2017-01-04 DIAGNOSIS — Z510 Encounter for antineoplastic radiation therapy: Secondary | ICD-10-CM | POA: Diagnosis not present

## 2017-01-04 DIAGNOSIS — Z853 Personal history of malignant neoplasm of breast: Secondary | ICD-10-CM | POA: Diagnosis not present

## 2017-01-04 DIAGNOSIS — M546 Pain in thoracic spine: Secondary | ICD-10-CM | POA: Diagnosis not present

## 2017-01-04 NOTE — Progress Notes (Signed)
  Radiation Oncology         [phone] ________________________________ Outpatient  Stereotactic Treatment Procedure Note  Name: MARVEEN DONLON MRN: [id number]  Date: 01/04/2017  DOB: 05-24-1945  SPECIAL TREATMENT PROCEDURE    ICD-10-CM   1. Brain metastases ([place]) C79.31     3D TREATMENT PLANNING AND DOSIMETRY:  The patient's radiation plan was reviewed and approved by neurosurgery and radiation oncology prior to treatment.  It showed 3-dimensional radiation distributions overlaid onto the planning CT/MRI image set.  The [facility] for the target structures as well as the organs at risk were reviewed. The documentation of the 3D plan and dosimetry are filed in the radiation oncology EMR.  NARRATIVE:  Yanelis R Mihalko was brought to the TrueBeam stereotactic radiation treatment machine and placed supine on the CT couch. The head frame was applied, and the patient was set up for stereotactic radiosurgery.  Neurosurgery was present for the set-up and delivery  SIMULATION VERIFICATION:  In the couch zero-angle position, the patient underwent Exactrac imaging using the Brainlab system with orthogonal KV images.  These were carefully aligned and repeated to confirm treatment position for each of the isocenters.  The Exactrac snap film verification was repeated at each couch angle.  SPECIAL TREATMENT PROCEDURE: [name] received stereotactic radiosurgery to the following targets: Right posterior frontal 27mm target was treated using 4 Rapid Arc VMAT Beams to a prescription dose of 9 Gy (3rd of 3 fractions to eventually total 27 Gy).  ExacTrac Snap verification was performed for each couch angle.   This constitutes a special treatment procedure due to the ablative dose delivered and the technical nature of treatment.  This highly technical modality of treatment ensures that the ablative dose is centered on the patient's tumor while sparing normal tissues from excessive dose and risk of detrimental  effects.  STEREOTACTIC TREATMENT MANAGEMENT:  Following delivery, the patient was transported to nursing in stable condition and monitored for possible acute effects.  Vitals:   01/04/17 1232  BP: 121/84  Pulse: 86  Resp: 20  Temp: 98.1 F (36.7 C)  SpO2: 98%    The patient tolerated treatment without significant acute effects, and was discharged to home in stable condition.    PLAN: Follow-up in 1 month. ________________________________   Eppie Gibson, MD

## 2017-01-06 ENCOUNTER — Other Ambulatory Visit: Payer: Self-pay

## 2017-01-06 ENCOUNTER — Ambulatory Visit (HOSPITAL_BASED_OUTPATIENT_CLINIC_OR_DEPARTMENT_OTHER): Payer: Medicare Other | Admitting: Hematology and Oncology

## 2017-01-06 VITALS — BP 160/89 | HR 95 | Temp 97.8°F | Resp 18 | Wt 163.3 lb

## 2017-01-06 DIAGNOSIS — C3490 Malignant neoplasm of unspecified part of unspecified bronchus or lung: Secondary | ICD-10-CM | POA: Diagnosis not present

## 2017-01-06 DIAGNOSIS — C3431 Malignant neoplasm of lower lobe, right bronchus or lung: Secondary | ICD-10-CM

## 2017-01-06 DIAGNOSIS — C7802 Secondary malignant neoplasm of left lung: Secondary | ICD-10-CM | POA: Diagnosis not present

## 2017-01-06 DIAGNOSIS — C50412 Malignant neoplasm of upper-outer quadrant of left female breast: Secondary | ICD-10-CM

## 2017-01-06 DIAGNOSIS — T732XXD Exhaustion due to exposure, subsequent encounter: Secondary | ICD-10-CM

## 2017-01-06 DIAGNOSIS — R5383 Other fatigue: Secondary | ICD-10-CM | POA: Insufficient documentation

## 2017-01-06 MED ORDER — CYANOCOBALAMIN 1000 MCG/ML IJ SOLN
1000.0000 ug | Freq: Once | INTRAMUSCULAR | Status: AC
Start: 2017-01-06 — End: 2017-01-06
  Administered 2017-01-06: 1000 ug via INTRAMUSCULAR

## 2017-01-06 MED ORDER — CYANOCOBALAMIN 1000 MCG/ML IJ SOLN
INTRAMUSCULAR | Status: AC
  Filled 2017-01-06: qty 1

## 2017-01-06 NOTE — Progress Notes (Signed)
Patient on plan of care prior to pathways. 

## 2017-01-06 NOTE — Progress Notes (Signed)
START ON PATHWAY REGIMEN - Non-Small Cell Lung     A cycle is every 21 days:     Pembrolizumab      Pemetrexed      Carboplatin   **Always confirm dose/schedule in your pharmacy ordering system**    Patient Characteristics: Stage IV Metastatic, Nonsquamous, Initial Chemotherapy/Immunotherapy, PS = 0, 1, PD-L1 Expression Positive 1-49% (TPS) / Negative / Not Tested / Awaiting Test Results AJCC T Category: TX Current Disease Status: Distant Metastases AJCC N Category: NX AJCC M Category: M1 AJCC 8 Stage Grouping: IV Histology: Nonsquamous Cell ROS1 Rearrangement Status: Awaiting Test Results T790M Mutation Status: Not Applicable - EGFR Mutation Negative/Unknown Other Mutations/Biomarkers: No Other Actionable Mutations PD-L1 Expression Status: Awaiting Test Results Chemotherapy/Immunotherapy LOT: Initial Chemotherapy/Immunotherapy Molecular Targeted Therapy: Not Appropriate ALK Translocation Status: Awaiting Test Results Would you be surprised if this patient died  in the next year<= I would be surprised if this patient died in the next year EGFR Mutation Status: Awaiting Test Results BRAF V600E Mutation Status: Awaiting Test Results Performance Status: PS = 0, 1 Intent of Therapy: Non-Curative / Palliative Intent, Discussed with Patient

## 2017-01-06 NOTE — Assessment & Plan Note (Signed)
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 She declined radiation and adjuvant antiestrogen therapies. 02/11/2016: Left lumpectomy: LCIS

## 2017-01-06 NOTE — Assessment & Plan Note (Signed)
CT chest 07/11/2018done for chronic cough for 4 months Right lower lobe lung spiculated mass 1.8 cm with right hilar and mediastinal adenopathy, right hilar soft tissue density causes mass effect, soft tissue density filling the right lower lobe bronchi, groundglass nodularity right upper lobe, 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  Left adrenal gland biopsy 11/21/2016: Consistent with metastatic lung adenocarcinoma TTF-1 positive, negative for GATA-3, CD56, CK5/6, and ER.

## 2017-01-06 NOTE — Progress Notes (Signed)
Spoke with Tammy from pathology [facility] to request urgent foundation 1 for pt. Estimate time of 2 weeks to receive results. Dr.Gudena notified. Will call pt to reschedule appt with Dr.Gudena.  1005am- Pt would like to see Dr.Gudena today and speak with him on future treatment plan. Will keep appt for 1045am.

## 2017-01-06 NOTE — Progress Notes (Signed)
Patient Care Team: Jonathon Jordan, MD as PCP - General (Family Medicine)  DIAGNOSIS:  Encounter Diagnoses  Name Primary?  . Primary cancer of upper outer quadrant of left female breast ([place]) Yes  . Metastatic lung cancer (metastasis from lung to other site), unspecified laterality ([place])   . 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      05/18/2011 - 03/12/2012 Anti-estrogen oral therapy    Neoadjuvant Arimidex 1 mg daily      02/29/2012 Surgery    Left breast lumpectomy without sentinel lymph node biopsy (patient declined), 1.1 cm well-differentiated IDC ER/PR positive HER-2 negative T1 cN0 M0 stage IA       Radiation Therapy    Declined radiation therapy, also declined antiestrogen therapy      02/11/2016 Surgery    Left Lumpectomy: 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      12/30/2016 -  Radiation Therapy    SRS with Dr. Isidore Moos initiated       Metastatic lung cancer (metastasis from lung to other site), unspecified laterality ([place])   11/21/2016 Initial Diagnosis    Left adrenal gland biopsy 11/21/2016: Consistent with metastatic lung adenocarcinoma TTF-1 positive, negative for GATA-3, CD56, CK5/6, and ER.      12/03/2016 Imaging    Brain MRI: 2.4 x 2 cm hemorrhagic metastatic deposit right frontal parietal lobe, 6.5 mm metastatic  deposit left medial parietal lobe      12/20/2016 Imaging    Brain MRI: Interval increase in size of the posterior right frontal lobe and left parietal lobe lesions reflecting interval hemorrhage, additional 4 mm lesion left parietal lobe      12/31/2016 Imaging    Brain MRI: Right parietal lobe 3.7 x 3.8 cm tumor increase in size; another hemorrhagic metastatic deposit left medial parietal lobe 1.2 cm previously was 1 cm      01/03/2017 Imaging    CT chest 07/11/2018done for chronic cough for 4 months Right lower lobe lung spiculated mass 1.8 cm with right hilar and mediastinal adenopathy, right hilar soft tissue density causes mass effect, soft tissue density filling the right lower lobe bronchi, groundglass nodularity right upper lobe, 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      01/04/2017 - 01/05/2017 Radiation Therapy    Stereotactic radiosurgery for brain metastases       CHIEF COMPLIANT: Follow-up to discuss the treatment plan  INTERVAL HISTORY: Kelly Stuart is a 71 year old with above-mentioned history of metastatic lung cancer with brain metastases and adrenal and bone metastases who completed stereotactic radiosurgery to the brain. She is here today accompanied by her family members to discuss systemic therapy plan. 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 She recently had a bout of  visits to the emergency room after she took 2 mg of Ativan and felt extremely drowsy and the she then underwent a brain MRI in the emergency room. She is anxious to get started with her systemic therapy. She is disappointed that we do not yet have the foundation 1 results.  REVIEW OF SYSTEMS:   Constitutional: Denies fevers, chills or abnormal weight loss Eyes: Denies blurriness of vision Ears, nose, mouth, throat, and face: Denies mucositis or sore  throat Respiratory: Denies cough, dyspnea or wheezes Cardiovascular: Denies palpitation, chest discomfort Gastrointestinal:  Denies nausea, heartburn or change in bowel habits Skin: Denies abnormal skin rashes Lymphatics: Denies new lymphadenopathy or easy bruising Neurological: Left arm weakness and focal motor seizures of the left upper extremity Behavioral/Psych: Mood is stable, no new changes  Extremities: No lower extremity edema All other systems were reviewed with the patient and are negative.  I have reviewed the past medical history, past surgical history, social history and family history with the patient and they are unchanged from previous note.  ALLERGIES:  is allergic to codeine; penicillins; sulfa antibiotics; and tetracyclines & related.  MEDICATIONS:  Current Outpatient Prescriptions  Medication Sig Dispense Refill  . amLODipine (NORVASC) 5 MG tablet Take 5 mg by mouth daily.    Marland Kitchen dexamethasone (DECADRON) 2 MG tablet Take 2 tablets (4 mg total) by mouth 2 (two) times daily with a meal. 21m BIDx 4 days, then 238mBID x4 days, then 46m66maily x4 days 28 tablet 0  . escitalopram (LEXAPRO) 10 MG tablet Take 10 mg by mouth daily.     . iMarland Kitchenuprofen (ADVIL,MOTRIN) 200 MG tablet Take 200 mg by mouth 2 (two) times daily as needed for headache or moderate pain.    . lMarland KitchenvETIRAcetam (KEPPRA) 500 MG tablet Take 2 tablets (1,000 mg total) by mouth 2 (two) times daily. 120 tablet 3   No current facility-administered medications for this visit.     PHYSICAL EXAMINATION: ECOG PERFORMANCE STATUS: 1 - Symptomatic but completely ambulatory  Vitals:   01/06/17 1110  BP: (!) 160/89  Pulse: 95  Resp: 18  Temp: 97.8 F (36.6 C)  SpO2: 98%   Filed Weights   01/06/17 1110  Weight: 163 lb 4.8 oz (74.1 kg)    GENERAL:alert, no distress and comfortable SKIN: skin color, texture, turgor are normal, no rashes or significant lesions EYES: normal, Conjunctiva are pink and non-injected,  sclera clear OROPHARYNX:no exudate, no erythema and lips, buccal mucosa, and tongue normal  NECK: supple, thyroid normal size, non-tender, without nodularity LYMPH:  no palpable lymphadenopathy in the cervical, axillary or inguinal LUNGS: clear to auscultation and percussion with normal breathing effort HEART: regular rate & rhythm and no murmurs and no lower extremity edema ABDOMEN:abdomen soft, non-tender and normal bowel sounds MUSCULOSKELETAL:no cyanosis of digits and no clubbing  NEURO: alert & oriented x 3 with fluent speech, Left arm weakness but improving dexterity and strength EXTREMITIES: No lower extremity edema  LABORATORY DATA:  I have reviewed the data as listed   Chemistry      Component Value Date/Time   NA 135 12/30/2016 2158   NA 141 02/03/2014 1259   K 4.1 12/30/2016 2158   K 4.2 02/03/2014 1259   CL 103 12/30/2016 2158   CL 106 06/12/2012 1411   CO2 23 12/30/2016 2158   CO2 25 02/03/2014 1259   BUN 7 12/30/2016 2158   BUN 13.1 02/03/2014 1259   CREATININE 0.51 12/30/2016 2158   CREATININE 0.8 02/03/2014 1259  Component Value Date/Time   CALCIUM 9.3 12/30/2016 2158   CALCIUM 9.6 02/03/2014 1259   ALKPHOS 87 11/21/2016 1038   ALKPHOS 56 02/03/2014 1259   AST 24 11/21/2016 1038   AST 17 02/03/2014 1259   ALT 13 (L) 11/21/2016 1038   ALT 17 02/03/2014 1259   BILITOT 1.1 11/21/2016 1038   BILITOT 0.59 02/03/2014 1259       Lab Results  Component Value Date   WBC 7.0 12/30/2016   HGB 12.5 12/30/2016   HCT 38.5 12/30/2016   MCV 88.3 12/30/2016   PLT 363 12/30/2016   NEUTROABS 6.5 12/30/2016    ASSESSMENT & PLAN:  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 She declined radiation and adjuvant antiestrogen therapies. 02/11/2016: Left lumpectomy: LCIS  Metastatic lung cancer (metastasis  from lung to other site), unspecified laterality ([place]) CT chest 07/11/2018done for chronic cough for 4 months Right lower lobe lung spiculated mass 1.8 cm with right hilar and mediastinal adenopathy, right hilar soft tissue density causes mass effect, soft tissue density filling the right lower lobe bronchi, groundglass nodularity right upper lobe, 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  Left adrenal gland biopsy 11/21/2016: Consistent with metastatic lung adenocarcinoma TTF-1 positive, negative for GATA-3, CD56, CK5/6, and ER.  Plan: 1. Foundation 1 analysis to evaluate if there are any actionable mutations 2. PD 1 testing for tumor mutational burden If the patient does not have any actionable mutations then I recommend systemic therapy with carboplatin, Alimta, Pembrolizumab. If the PD 1 is greater than 50%, we can consider single agent Pembrolizumab. I would like to obtain prior authorization approval for carboplatin, Alimta, Pembrolizumab. I plan to start the treatment on 01/23/2017  I spent 25 minutes talking to the patient of which more than half was spent in counseling and coordination of care.  Orders Placed This Encounter  Procedures  . SCHEDULING COMMUNICATION INJECTION    Schedule 15 minute injection appointment   The patient has a good understanding of the overall plan. she agrees with it. she will call with any problems that may develop before the next visit here.   Rulon Eisenmenger, MD 01/06/17

## 2017-01-09 ENCOUNTER — Telehealth: Payer: Self-pay | Admitting: Hematology and Oncology

## 2017-01-09 ENCOUNTER — Ambulatory Visit: Payer: Medicare Other | Attending: Internal Medicine | Admitting: Rehabilitative and Restorative Service Providers"

## 2017-01-09 DIAGNOSIS — R29818 Other symptoms and signs involving the nervous system: Secondary | ICD-10-CM | POA: Diagnosis not present

## 2017-01-09 DIAGNOSIS — R2689 Other abnormalities of gait and mobility: Secondary | ICD-10-CM | POA: Diagnosis not present

## 2017-01-09 DIAGNOSIS — M6281 Muscle weakness (generalized): Secondary | ICD-10-CM | POA: Diagnosis not present

## 2017-01-09 NOTE — Telephone Encounter (Signed)
Scheduled appt per 9/14 los - patient Is aware of appt date and time.

## 2017-01-09 NOTE — Patient Instructions (Signed)
Heel Cord Stretch    Place one leg forward, bent, other leg behind and straight. Lean forward keeping back heel flat. Hold __20-30__ seconds while counting out loud. L LEG BEHIND. Repeat __3__ times. Do _2___ sessions per day.  [email or web address].exer.us/512   Copyright  VHI. All rights reserved.

## 2017-01-10 ENCOUNTER — Encounter: Payer: Self-pay | Admitting: Radiation Oncology

## 2017-01-10 NOTE — Therapy (Signed)
[facility] [address] [place], [place], [address] Phone: [phone]   Fax:  [phone]  Physical Therapy Evaluation  Patient Details  Name: Kelly Stuart MRN: [id number] Date of Birth: 1945/09/15 Referring Provider: Cecil Cobbs, DO  Encounter Date: 01/09/2017      PT End of Session - 01/10/17 1338    Visit Number 1   Number of Visits 16   Date for PT Re-Evaluation 03/11/17   Authorization Type G code every 10th visit   PT Start Time 0935   PT Stop Time 1017   PT Time Calculation (min) 42 min   Equipment Utilized During Treatment Gait belt   Activity Tolerance Patient tolerated treatment well   Behavior During Therapy [facility] for tasks assessed/performed      Past Medical History:  Diagnosis Date  . Anxiety   . Breast cancer ([place])    ER+PR+ HER-2NEU negative    Past Surgical History:  Procedure Laterality Date  . BREAST LUMPECTOMY WITH NEEDLE LOCALIZATION  02/29/2012   Procedure: BREAST LUMPECTOMY WITH NEEDLE LOCALIZATION;  Surgeon: Rolm Bookbinder, MD;  Location: [facility];  Service: General;  Laterality: Left;  . BREAST SURGERY    . COSMETIC SURGERY    . DILATION AND CURETTAGE OF UTERUS  1998   submucus myoma-resected  . EYE SURGERY    . FACELIFT W/BLEPHAROPLASTY  1999      . maxillofacial surgery  1980   mouth-ealign teeth-  . minifacelift  2007  . RADIOACTIVE SEED GUIDED EXCISIONAL BREAST BIOPSY N/A 02/11/2016   Procedure: LEFT BREAST RADIOACTIVE SEED GUIDED EXCISIONAL BIOPSY;  Surgeon: Rolm Bookbinder, MD;  Location: [facility];  Service: General;  Laterality: N/A;  . TONSILLECTOMY      There were no vitals filed for this visit.       Subjective Assessment - 01/09/17 2207    Subjective The patient reports sudden onset of focal seizures 11/25/2016 in L UE , described as a spasm in her arm.  This also coincided with acute outbreak of shingles.  She reports  "my arm went dead".  She reports some experiences of "hand drawing up and someone has to pry it open."  She began Keppra to treat focal seizures.  She recently had radiation on the right side of her brain and 2 places on her spine (per report).    She notes a sensation of stiffness throughout.   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   Patient Stated Goals "Getting back to walking", also wants to focus on the left arm to gain greater independence, improving balance.   Currently in Pain? No/denies            [facility] PT Assessment - 01/09/17 2205      Assessment   Medical Diagnosis seizures, L hemimotor impairments   Referring Provider Cecil Cobbs, DO   Onset Date/Surgical Date 11/25/16   Hand Dominance Right   Prior Therapy history of therapy for L LE weakness due to sciatica     Precautions   Precautions Fall     Restrictions   Weight Bearing Restrictions No     Balance Screen   Has the patient fallen in the past 6 months No   Has the patient had a decrease in activity level because of a fear of falling?  Yes  due to radiation and limitations with left side  Is the patient reluctant to leave their home because of a fear of falling?  Yes     [place] Private residence   Living Arrangements Other relatives  staying with her sister temporarily   Type of [place] to enter   Entrance Stairs-Number of Steps 1   [place] None   Additional Comments Patient stayed alone until beginning keppra and noticing imbalance.       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      ROM / Strength   AROM / PROM / Strength AROM;Strength     AROM   Overall AROM Comments L UE the patient notes that she has been working at home to regain movement.  Slowed pace with performing AROM, but able to accomplish [facility] AROM     Strength   Overall Strength Comments R UE is 4/5 for shoulder flexion, 5/5 shoulder abduction 5/5, elbow flexion/extension.  L UE is 3/5 with significant effort to lift against gravity. R hip flexion 4/5, L hip flexion 3/5, R knee flexion/extension 5/5, L knee flexion/extension 4/5, R ankle DF 4/5, L ankle DF is 3/5     Flexibility   Soft Tissue Assessment /Muscle Length yes  tightness in heel cord L side     Ambulation/Gait   Ambulation/Gait Yes   Ambulation/Gait Assistance 4: Min guard   Ambulation Distance (Feet) 100 Feet   Assistive device None   Ambulation Surface Level;Indoor   Gait velocity 1.75 ft/sec   Gait Comments Patient uses steppage gait to clear L foot during swing phase.  This increases L UE flexion pattern with L UE elevated during gait.  At times, the patient's R UE moves into ER with elbow flexion and she hits door frame with L UE.  She notes sensation intact, but proprioception/awareness of position appears limited.     Standardized Balance Assessment   Standardized Balance Assessment Berg Balance Test     Berg Balance Test   Sit to Stand Able to stand  independently using hands   Standing Unsupported Able to stand safely 2 minutes   Sitting with Back Unsupported but Feet Supported on Floor or Stool Able to sit safely and securely 2 minutes   Stand to Sit Sits safely with minimal use of hands   Transfers Able to transfer safely, definite need of hands   Standing Unsupported with Eyes Closed Able to  stand 10 seconds with supervision   Standing Ubsupported with Feet Together Able to place feet together independently and stand 1 minute safely   From Standing, Reach Forward with Outstretched Arm Can reach confidently >25 cm (10")   From Standing Position, Pick up Object from Floor Able to pick up shoe safely and easily   From Standing Position, Turn to Look Behind Over each Shoulder Looks behind from both sides and weight shifts well   Turn 360 Degrees Needs  close supervision or verbal cueing   Standing Unsupported, Alternately Place Feet on Step/Stool Needs assistance to keep from falling or unable to try   Standing Unsupported, One Foot in [place] help to step but can hold 15 seconds   Standing on One Leg Able to lift leg independently and hold equal to or more than 3 seconds   Total Score 41   Berg comment: 41/56 indicating fall risk            Objective measurements completed on examination: See above findings.          [facility] Adult PT Treatment/Exercise - 01/09/17 2205      Exercises   Exercises Ankle     Ankle Exercises: Stretches   Gastroc Stretch 2 reps;30 seconds                PT Education - 01/10/17 1337    Education provided Yes   Education Details HEP: heel cord stretch standing   Person(s) Educated Patient;Caregiver(s)   Methods Explanation;Demonstration;Handout   Comprehension Returned demonstration;Verbalized understanding          PT Short Term Goals - 01/10/17 1338      PT SHORT TERM GOAL #1   Title The patient will be indep with HEP for LE stretching/flexibility, LE strengthening, general mobility.   Time 4   Period Weeks   Target Date 02/09/17     PT SHORT TERM GOAL #2   Title The patient will improve Berg to > or equal to 44/56 to demo improving balance.   Baseline 41/56   Time 4   Period Weeks   Target Date 02/09/17     PT SHORT TERM GOAL #3   Title The patient will improve gait speed from 1.75 ft/sec to > or equal to  2.1 ft/sec to demo decreased risk for falls.   Baseline 1.75 ft/sec   Time 4   Period Weeks   Target Date 02/09/17     PT SHORT TERM GOAL #4   Title The patient will ambulate with AFO (to assess) modified indep to improve household independence.   Baseline requires CGA   Time 4   Period Weeks   Target Date 02/09/17           PT Long Term Goals - 01/10/17 1340      PT LONG TERM GOAL #1   Title The patient will return demo HEP progression for post d/c activities.   Time 8   Period Weeks   Target Date 03/11/17     PT LONG TERM GOAL #2   Title The patient will improve Berg score to > or equal to 46/56 to demo dec'd risk for falls.   Baseline 41/56   Time 8   Period Weeks   Target Date 03/11/17     PT LONG TERM GOAL #3   Title The patient will improve gait speed to > or equal to 2.3 ft/sec.   Baseline 1.75 ft/sec   Time 8   Period Weeks   Target Date 03/11/17     PT LONG TERM GOAL #4   Title The patient will negotiate 12 steps with one handrail mod indep for household access.   Time 8   Period Weeks   Target Date 03/11/17                Plan - 01/10/17 1343    Clinical Impression Statement The patient is a 71 year old female presenting to OP PT  with L UE/LE weakness  associated with brain metastasis and radiation.  She also notes h/o L sciatica with foot drop prior to metastatic cancer.  The patient notes balance worsened after initiation of Keppra to treat L UE focal seizures.  She has moved in with her sister recently due to instability with gait/mobility.  PT to address deficits to optimize current functional status.    History and Personal Factors relevant to plan of care: metastatic brain disease, h/o L foot drop x years per patient report, h/o L breast cancer, lung cancer   Clinical Presentation Evolving   Clinical Presentation due to: diagnosis and disease progression   Clinical Decision Making Moderate   Rehab Potential Good   Clinical Impairments  Affecting Rehab Potential metastatic disease   PT Frequency 2x / week   PT Duration 8 weeks      Patient will benefit from skilled therapeutic intervention in order to improve the following deficits and impairments:  Abnormal gait, Decreased balance, Decreased activity tolerance, Impaired tone, Decreased mobility, Decreased strength, Decreased endurance  Visit Diagnosis: Other abnormalities of gait and mobility  Other symptoms and signs involving the nervous system  Muscle weakness (generalized)      G-Codes - 2017/01/12 [id number]    Functional Assessment Tool Used (Outpatient Only) Berg=41/56, gait speed=1.75 ft/sec   Functional Limitation Mobility: Walking and moving around   Mobility: Walking and Moving Around Current Status [phone]) At least 20 percent but less than 40 percent impaired, limited or restricted   Mobility: Walking and Moving Around Goal Status [phone]) At least 1 percent but less than 20 percent impaired, limited or restricted       Problem List Patient Active Problem List   Diagnosis Date Noted  . Fatigue 01/06/2017  . 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 Brain metastases ([place]) 12/13/2016  . Metastatic lung cancer (metastasis from lung to other site), unspecified laterality ([place]) 11/28/2016  . Lung nodules 11/14/2016  . Primary cancer of upper outer quadrant of left female breast ([place]) 05/05/2011    [place], PT 01-12-2017, 10:09 PM  [facility] [address] [place], [place], [address] Phone: [phone]   Fax:  [phone]  Name: Kelly Stuart MRN: [id number] Date of Birth: [birth year]

## 2017-01-10 NOTE — Discharge Summary (Signed)
Kelly Stuart  DJM:[id number]  DOB: 05-14-1945  DOA: 12/30/2016 PCP: Jonathon Jordan, MD  Admit date: 12/30/2016 Discharge date: 12/31/2016  Admitted From: Home  Disposition: Home   Recommendations for Outpatient Follow-up:  1. Follow up with PCP in 1 weeks  Discharge Condition: Stable  CODE STATUS: Full  Diet recommendation: Heart Healthy   Brief/Interim Summary: Kelly Stuart is a 71 year old female with medical history significant for, HTN, BRCA status post lumpectomy, metastatic lung cancer with metastases to brain and spine. Presented to the emergency department with bilateral weakness. Patient was seen and the neuro-oncology office where she was noted to have focal seizure of the left arm which she was given 2 mg Ativan and subsequently started on Keppra 1G twice a day and Decadron. After receiving the medication patient felt drunk like with difficult ambulation and was brought to the Patient was placed on observation for MRI evaluation as per discussion with neurology to rule out spinal cord compression.MRI of the brain, C and T spine did not showed any acute changes. Patient is back to her normal and ambulating with no issues. Patient feels mentally at baseline, and no further complaints at this moment. Patient will be discharged home to follow-up with primary care doctor.  Subjective: Patient seen and examined with sister at bedside.  She has no complaints this morning, denies weakness, dizziness or chest pain and palpitation.Tolerating diet well, ambulated with help or issues.  Discharge Diagnoses/Hospital Course:  Ataxia - secondary to medication side effect.  Patient was given 25m of Ativan and Keppra 1G which are very sedating agents.  MRI brain, C and T spine with no acute issues or cord compression  Patient has returned to baseline   Metastatic lung CA S/p radiation 9/7 Continue decadron   Partial seizures  Continue Keppra Left arm weakness - PT has been arranged  by PCP   HTN  BP stable during hospital stay  No changes in medications were made   All other chronic medical condition were stable during the hospitalization.  On the day of the discharge the patient's vitals were stable, and no other acute medical condition were reported by patient. Patient was felt safe to be discharge to home   Discharge Instructions  You were cared for by a hospitalist during your hospital stay. If you have any questions about your discharge medications or the care you received while you were in the hospital after you are discharged, you can call the unit and asked to speak with the hospitalist on call if the hospitalist that took care of you is not available. Once you are discharged, your primary care physician will handle any further medical issues. Please note that NO REFILLS for any discharge medications will be authorized once you are discharged, as it is imperative that you return to your primary care physician (or establish a relationship with a primary care physician if you do not have one) for your aftercare needs so that they can reassess your need for medications and monitor your lab values.      Discharge Instructions    Call MD for:  difficulty breathing, headache or visual disturbances    Complete by:  As directed    Call MD for:  extreme fatigue    Complete by:  As directed    Call MD for:  hives    Complete by:  As directed    Call MD for:  persistant dizziness or light-headedness    Complete by:  As directed  Call MD for:  persistant nausea and vomiting    Complete by:  As directed    Call MD for:  redness, tenderness, or signs of infection (pain, swelling, redness, odor or green/yellow discharge around incision site)    Complete by:  As directed    Call MD for:  severe uncontrolled pain    Complete by:  As directed    Call MD for:  temperature >100.4    Complete by:  As directed    Diet - low sodium heart healthy     Complete by:  As directed    Increase activity slowly    Complete by:  As directed          Allergies as of 12/31/2016      Reactions   Codeine Nausea And Vomiting   Penicillins Rash   Has patient had a PCN reaction causing immediate rash, facial/tongue/throat swelling, SOB or lightheadedness with hypotension: Yes Has patient had a PCN reaction causing severe rash involving mucus membranes or skin necrosis: No Has patient had a PCN reaction that required hospitalization: Yes Has patient had a PCN reaction occurring within the last 10 years: No If all of the above answers are "NO", then may proceed with Cephalosporin use.   Sulfa Antibiotics Rash   Tetracyclines & Related Rash   Patient states Acromycin                           Medication List               STOP taking these medications            LORazepam 1 MG tablet Commonly known as:  ATIVAN               TAKE these medications            amLODipine 5 MG tablet Commonly known as:  NORVASC Take 5 mg by mouth daily.    dexamethasone 2 MG tablet Commonly known as:  DECADRON Take 2 tablets (4 mg total) by mouth 2 (two) times daily with a meal. 39m BIDx 4 days, then 270mBID x4 days, then 40m58maily x4 days    escitalopram 10 MG tablet Commonly known as:  LEXAPRO Take 10 mg by mouth daily.    ibuprofen 200 MG tablet Commonly known as:  ADVIL,MOTRIN Take 200 mg by mouth 2 (two) times daily as needed for headache or moderate pain.    levETIRAcetam 500 MG tablet Commonly known as:  KEPPRA Take 2 tablets (1,000 mg total) by mouth 2 (two) times daily.                                         Discharge Care Instructions               Start     Ordered   12/31/16 0000  Increase activity slowly     12/31/16 1326   12/31/16 0000  Diet - low sodium heart healthy     12/31/16 1326   12/31/16 0000  Call MD for:  temperature >100.4     12/31/16 1326   12/31/16 0000  Call MD  for:  persistant nausea and vomiting     12/31/16 1326   12/31/16 0000  Call MD for:  severe uncontrolled pain     12/31/16 1326  12/31/16 0000  Call MD for:  redness, tenderness, or signs of infection (pain, swelling, redness, odor or green/yellow discharge around incision site)     12/31/16 1326   12/31/16 0000  Call MD for:  difficulty breathing, headache or visual disturbances     12/31/16 1326   12/31/16 0000  Call MD for:  hives     12/31/16 1326   12/31/16 0000  Call MD for:  persistant dizziness or light-headedness     12/31/16 1326   12/31/16 0000  Call MD for:  extreme fatigue     12/31/16 1326        Follow-up Information    Jonathon Jordan, MD. Schedule an appointment as soon as possible for a visit in 1 week(s).   Specialty:  Family Medicine Why:  Hospital follow up  Contact information: [address] [place] [address] [phone]               Allergies  Allergen Reactions  . Codeine Nausea And Vomiting  . Penicillins Rash    Has patient had a PCN reaction causing immediate rash, facial/tongue/throat swelling, SOB or lightheadedness with hypotension: Yes Has patient had a PCN reaction causing severe rash involving mucus membranes or skin necrosis: No Has patient had a PCN reaction that required hospitalization: Yes Has patient had a PCN reaction occurring within the last 10 years: No If all of the above answers are "NO", then may proceed with Cephalosporin use.   . Sulfa Antibiotics Rash  . Tetracyclines & Related Rash    Patient states Acromycin    Consultations:  None    Procedures/Studies: Imaging Results  Ct Head Wo Contrast  Result Date: 12/30/2016 CLINICAL DATA:  Patient had radiation therapy to the brain today and this evening noticed abnormal gait. Loss of cord nasion in the left leg. History of seizures and lung cancer with brain metastasis. EXAM: CT HEAD WITHOUT CONTRAST TECHNIQUE:  Contiguous axial images were obtained from the base of the skull through the vertex without intravenous contrast. COMPARISON:  MRI brain 12/20/2016 FINDINGS: Brain: Mass lesion in the right posterior frontal lobe with central necrosis measuring 3.6 cm diameter. Mild surrounding vasogenic edema. 1.1 cm diameter mass lesion along the left medial parietal cortical gyrus. Both of these lesions can be seen on the previous MRI examination. Additional tiny lesion in the left parietal cortex is not identified at CT. There is mild effacement of right frontal sulci caused by the mass lesion but without change since the MRI. No midline shift. No abnormal extra-axial fluid collections. Gray-white matter junctions are distinct. Basal cisterns are not effaced. Ventricles are not dilated or effaced. No acute intracranial hemorrhage. Vascular: No hyperdense vessel or unexpected calcification. Skull: Normal. Negative for fracture or focal lesion. Sinuses/Orbits: No acute finding. Other: None. IMPRESSION: Known metastatic lesions identified in the right posterior frontal low measuring 3.6 cm diameter and in the left medial parietal lobe measuring 1.1 cm diameter. Mild vasogenic edema. No increase in mass effect and no midline shift. No acute intracranial hemorrhage. Electronically Signed   By: Lucienne Capers M.D.   On: 12/30/2016 22:21   Mr Jeri Cos XV Contrast  Result Date: 12/31/2016 CLINICAL DATA:  Ataxia. Rule out stroke. History of metastatic disease EXAM: MRI HEAD WITHOUT AND WITH CONTRAST MRI CERVICAL SPINE WITHOUT AND WITH CONTRAST TECHNIQUE: Multiplanar, multiecho pulse sequences of the brain and surrounding structures, and cervical spine, to include the craniocervical junction and cervicothoracic junction, were obtained without  and with intravenous contrast. CONTRAST:  15 mL MultiHance IV COMPARISON:  MRI head 12/20/2016 FINDINGS: MRI HEAD FINDINGS Brain: Image quality degraded by motion Large enhancing metastatic  deposit in the right parietal lobe shows interval growth now measuring 37 x 38 mm, compared with 30 x 34 mm previously. Mild surrounding edema unchanged. Moderate intra tumor hemorrhage unchanged. Hemorrhagic metastatic deposit left medial parietal lobe mildly larger now measuring 12 mm compared with 10.5 x 8.3 mm previously. Mild intra tumor hemorrhage unchanged. 4 mm left parietal cortical metastatic disease unchanged. Negative for hydrocephalus or midline shift. Mild mass-effect on the right ventricle due to the right parietal tumor. No acute ischemic infarction. Vascular: Normal arterial flow void Skull and upper cervical spine: Negative Sinuses/Orbits: Negative Other: None MRI CERVICAL SPINE FINDINGS Image quality degraded by significant motion. Alignment: Normal alignment.  Mild kyphosis Vertebrae: Negative for fracture or metastatic disease in the cervical spine Cord: Normal cord signal.  No enhancing lesion in the cord Posterior Fossa, vertebral arteries, paraspinal tissues: Negative Disc levels: Disc degeneration and spondylosis at C4-5, C5-6, and C6-7 causing foraminal narrowing bilaterally. No cord compression. IMPRESSION: Progression of metastatic disease in the parietal lobe bilaterally. Intra tumor hemorrhage is present as noted previously. No new metastatic disease in the brain. No acute infarct Negative for cervical spine metastatic disease.  No cord compression Image quality degraded by motion. Electronically Signed   By: Franchot Gallo M.D.   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 Hemorrhagic metastasis secondary to lung cancer. SRS planning. EXAM: MRI HEAD WITHOUT AND WITH CONTRAST TECHNIQUE: Multiplanar, multiecho pulse sequences of the brain and surrounding structures were obtained without and with intravenous contrast. CONTRAST:  27m MULTIHANCE GADOBENATE DIMEGLUMINE 529 MG/ML IV  SOLN COMPARISON:  [facility] brain [facility]03/2016 [facility] FINDINGS: Brain: A hemorrhagic lesion in the posterior right frontal lobe sixth has increased in size since the prior study. This likely reflects interval hemorrhage there is peripheral enhancement of this lesion which now measures 3.4 x 3.0 x 3.4 cm. A lesion in the medial left parietal lobe is also slightly larger than on the prior exam, now measuring 1.0 x 0.8 x 1.0 cm. Minimal hemorrhage is associated with the left parietal lesion. A 4 mm left parietal lesion is not well seen due to patient motion on the prior exam. There is significant surrounding vasogenic edema with each of these lesions. Other scattered periventricular and subcortical T2 changes bilaterally likely reflect the sequela of chronic microvascular ischemia. No acute infarct is present. The brainstem and cerebellum are within normal limits. Vascular: Flow is present in the major intracranial arteries. Skull and upper cervical spine: The skullbase is within normal limits. The craniocervical junction is normal. Degenerative changes are present the upper cervical spine. No definite osseous lesions are evident. Sinuses/Orbits: The paranasal sinuses and mastoid air cells are clear. The globes and orbits are unremarkable. IMPRESSION: 1. Interval increase in size of posterior right frontal lobe and medial left parietal lesions previously described. This likely reflects interval hemorrhage of the right frontal lesion and possibly in the left parietal lesion. 2. Additional 4 mm lesion noted in the left parietal lobe on image 112 of series 12. Electronically Signed   By: CSan MorelleM.D.   On: 12/20/2016 16:25   Mr BJeri CosWBTContrast  Result Date: 12/03/2016 CLINICAL DATA:  Metastatic lung cancer. History of breast  cancer 2013 EXAM: MRI HEAD WITHOUT AND WITH CONTRAST TECHNIQUE: Multiplanar, multiecho pulse sequences of the brain and surrounding structures were obtained without  and with intravenous contrast. CONTRAST:  15 mL MultiHance IV COMPARISON:  None. FINDINGS: Brain: Right frontal parietal enhancing mass lesion over the convexity near the motor strip. Lesion shows internal hemorrhage. Enhancing component measures 24 x 20 mm with mild surrounding edema. 6.5 mm enhancing nodule left medial parietal lobe with mild edema consistent with metastatic disease. No other enhancing lesions. Ventricle size is normal. No shift of the midline structures. Negative for acute infarct. Mild chronic ischemic change in the white matter. Vascular: Normal arterial flow voids Skull and upper cervical spine: Negative Sinuses/Orbits: Negative Other: None IMPRESSION: 24 x 20 mm hemorrhagic metastatic deposit in the right frontal parietal lobe over the convexity near the motor strip 6.5 mm enhancing metastatic deposit left medial parietal lobe. Electronically Signed   By: Franchot Gallo M.D.   On: 12/03/2016 14:44   Mr Cervical Spine W Wo Contrast  Result Date: 12/31/2016 CLINICAL DATA:  Ataxia. Rule out stroke. History of metastatic disease EXAM: MRI HEAD WITHOUT AND WITH CONTRAST MRI CERVICAL SPINE WITHOUT AND WITH CONTRAST TECHNIQUE: Multiplanar, multiecho pulse sequences of the brain and surrounding structures, and cervical spine, to include the craniocervical junction and cervicothoracic junction, were obtained without and with intravenous contrast. CONTRAST:  15 mL MultiHance IV COMPARISON:  MRI head 12/20/2016 FINDINGS: MRI HEAD FINDINGS Brain: Image quality degraded by motion Large enhancing metastatic deposit in the right parietal lobe shows interval growth now measuring 37 x 38 mm, compared with 30 x 34 mm previously. Mild surrounding edema unchanged. Moderate intra tumor hemorrhage unchanged. Hemorrhagic metastatic deposit left medial parietal lobe mildly larger now measuring 12 mm compared with 10.5 x 8.3 mm previously. Mild intra tumor hemorrhage unchanged. 4 mm left parietal cortical  metastatic disease unchanged. Negative for hydrocephalus or midline shift. Mild mass-effect on the right ventricle due to the right parietal tumor. No acute ischemic infarction. Vascular: Normal arterial flow void Skull and upper cervical spine: Negative Sinuses/Orbits: Negative Other: None MRI CERVICAL SPINE FINDINGS Image quality degraded by significant motion. Alignment: Normal alignment.  Mild kyphosis Vertebrae: Negative for fracture or metastatic disease in the cervical spine Cord: Normal cord signal.  No enhancing lesion in the cord Posterior Fossa, vertebral arteries, paraspinal tissues: Negative Disc levels: Disc degeneration and spondylosis at C4-5, C5-6, and C6-7 causing foraminal narrowing bilaterally. No cord compression. IMPRESSION: Progression of metastatic disease in the parietal lobe bilaterally. Intra tumor hemorrhage is present as noted previously. No new metastatic disease in the brain. No acute infarct Negative for cervical spine metastatic disease.  No cord compression Image quality degraded by motion. Electronically Signed   By: Franchot Gallo M.D.   On: 12/31/2016 10:53   Mr Thoracic Spine W Wo Contrast  Result Date: 12/31/2016 CLINICAL DATA:  Metastatic disease.  Ataxia.  Pain EXAM: MRI THORACIC WITHOUT AND WITH CONTRAST TECHNIQUE: Multiplanar and multiecho pulse sequences of the thoracic spine were obtained without and with intravenous contrast. CONTRAST:  15 mL MultiHance IV COMPARISON:  CT chest 11/02/2016 FINDINGS: MRI THORACIC SPINE FINDINGS Image quality degraded by significant motion. Alignment:  Normal Vertebrae: Negative for vertebral body fracture. Bony metastatic disease is present however small lesions could be missed given the amount of motion. Metastatic disease in the left pedicles of T6 and T7 as well as in the left seventh rib with associated soft tissue mass. Metastatic disease in the posterior elements  of T12 with mild posterior epidural tumor. Epidural tumor is  causing mild spinal stenosis and displacement of the cord anteriorly. Tumor extends into the right pedicle and vertebral body of T12 Cord:  Negative for cord compression.  Cord signal normal. Paraspinal and other soft tissues: Bony metastatic disease as above. Right lower lobe lung mass as noted on prior CT. Disc levels: Negative for disc degeneration or disc protrusion. IMPRESSION: Bony metastatic disease as above. 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 Negative for cord compression.  No pathologic fracture. Electronically Signed   By: Franchot Gallo M.D.   On: 12/31/2016 10:39   US Biopsy  Result Date: 12/07/2016 INDICATION: 71 year old female with history of breast carcinoma. FDG avid nodule of the left chest wall, with concern for metastases. EXAM: ULTRASOUND-GUIDED BIOPSY LEFT CHEST WALL NODULE MEDICATIONS: None. ANESTHESIA/SEDATION: None FLUOROSCOPY TIME:  None COMPLICATIONS: None PROCEDURE: Informed written consent was obtained from the patient after a thorough discussion of the procedural risks, benefits and alternatives. All questions were addressed. Maximal Sterile Barrier Technique was utilized including caps, mask, sterile gowns, sterile gloves, sterile drape, hand hygiene and skin antiseptic. A timeout was performed prior to the initiation of the procedure. Patient positioned right decubitus on the ultrasound table. Ultrasound images of left chest wall or performed with images stored and sent to PACs. The patient was then prepped and draped in the usual sterile fashion. The skin and subcutaneous tissues were generously infiltrated 1% lidocaine for local anesthesia. Using ultrasound guidance, multiple 18 gauge core biopsy of chest wall lesion or acquired. These are placed into formalin solution. Final images were stored. Patient tolerated the procedure well and remained hemodynamically stable throughout. No complications were encountered and no significant blood loss.  IMPRESSION: Status post ultrasound-guided biopsy of left chest wall lesion. Tissue specimen sent to pathology for complete histopathologic analysis. Signed, Dulcy Fanny. Earleen Newport, DO [facility] Electronically Signed   By: Corrie Mckusick D.O.   On: 12/07/2016 16:37      Discharge Exam:     Vitals:   12/31/16 0400 12/31/16 0521  BP: 103/69 126/63  Pulse: 77 92  Resp: 20 17  Temp:  97.9 F (36.6 C)  SpO2: 95% 94%         Vitals:   12/31/16 0300 12/31/16 0330 12/31/16 0400 12/31/16 0521  BP: 105/70 109/75 103/69 126/63  Pulse: 79 75 77 92  Resp: '18 20 20 17  ' Temp:    97.9 F (36.6 C)  TempSrc:    Oral  SpO2: 92% 94% 95% 94%  Weight:    76.7 kg (169 lb 1.6 oz)  Height:    '5\' 10"'  (1.778 m)    General: Pt is alert, awake, not in acute distress Cardiovascular: RRR, S1/S2 +, no rubs, no gallops Respiratory: CTA bilaterally, no wheezing, no rhonchi Abdominal: Soft, NT, ND, bowel sounds + Extremities: no edema, no cyanosis, strength 5/5 in all 4 extremities   The results of significant diagnostics from this hospitalization (including imaging, microbiology, ancillary and laboratory) are listed below for reference.     Microbiology: No results found for this or any previous visit (from the past 240 hour(s)).   Labs: BNP (last 3 results) Recent Labs (within last 365 days)  No results for input(s): BNP in the last 8760 hours.   Basic Metabolic Panel:  Last Labs    Recent Labs Lab 12/30/16 2158  NA 135  K 4.1  CL 103  CO2  23  GLUCOSE 162*  BUN 7  CREATININE 0.51  CALCIUM 9.3     Liver Function Tests: Last Labs   No results for input(s): AST, ALT, ALKPHOS, BILITOT, PROT, ALBUMIN in the last 168 hours.   Last Labs   No results for input(s): LIPASE, AMYLASE in the last 168 hours.   Last Labs   No results for input(s): AMMONIA in the last 168 hours.   CBC:  Last Labs    Recent  Labs Lab 12/30/16 2158  WBC 7.0  NEUTROABS 6.5  HGB 12.5  HCT 38.5  MCV 88.3  PLT 363     Cardiac Enzymes: Last Labs   No results for input(s): CKTOTAL, CKMB, CKMBINDEX, TROPONINI in the last 168 hours.   BNP: Last Labs   Invalid input(s): POCBNP   CBG: Last Labs   No results for input(s): GLUCAP in the last 168 hours.   D-Dimer Recent Labs (last 2 labs)   No results for input(s): DDIMER in the last 72 hours.   Hgb A1c Recent Labs (last 2 labs)   No results for input(s): HGBA1C in the last 72 hours.   Lipid Profile Recent Labs (last 2 labs)   No results for input(s): CHOL, HDL, LDLCALC, TRIG, CHOLHDL, LDLDIRECT in the last 72 hours.   Thyroid function studies  Recent Labs (last 2 labs)   No results for input(s): TSH, T4TOTAL, T3FREE, THYROIDAB in the last 72 hours.  Invalid input(s): FREET3   Anemia work up [employer] (last 2 labs)   No results for input(s): VITAMINB12, FOLATE, FERRITIN, TIBC, IRON, RETICCTPCT in the last 72 hours.   Urinalysis Labs (Brief)          Component Value Date/Time   BILIRUBINUR n 09/11/2014 0903   PROTEINUR n 09/11/2014 0903   UROBILINOGEN negative 09/11/2014 0903   NITRITE n 09/11/2014 0903   LEUKOCYTESUR Negative 09/11/2014 0903     Sepsis Labs Last Labs   Invalid input(s): PROCALCITONIN,  WBC,  LACTICIDVEN   Microbiology No results found for this or any previous visit (from the past 240 hour(s)).   Time coordinating discharge: 25 minutes  SIGNED:  Chipper Oman, MD           [facility] 12/31/2016, 1:27 PM  Pager please text page via  [email or web address] Password [email or web address]

## 2017-01-10 NOTE — Progress Notes (Signed)
  Radiation Oncology         [phone] ________________________________  Name: Kelly Stuart MRN: [id number]  Date: 01/10/2017  DOB: Sep 24, 1945  End of Treatment Note  Diagnosis:   Brain and bone metastases     Indication for treatment: Palliative      Radiation treatment dates:  Spine: 12/19/16-12/23/16     SRS: 12/30/16-01/04/17  Site/dose:   1.) Spine, 30Gy in 10 fractions   2.) Brain_SRS, PTV1, 27Gy in 3 fractions   3.) Brain_SRS, PTV2, 20Gy in 1 fraction   4.) Brain_SRS, PTV3, 20Gy in 1 fraction   Beams/energy:   1.) 3D, 15X    2.) SRS, 6X-FFF    3.) SRS, 6X-FFF    4.) SRS, 6X-FFF  Narrative: The patient tolerated radiation treatment relatively well. Initially, she was having pain to her area of shingles on her abdomen. She also experienced shortness of breath on exertion, but was able to recover quickly. These were stable throughout treatment and she tolerated the rest of her treatment well.   Plan: The patient has completed radiation treatment. The patient will return to radiation oncology clinic for routine followup in one month. I advised them to call or return sooner if they have any questions or concerns related to their recovery or treatment.  -----------------------------------  Eppie Gibson, MD  This document serves as a record of services personally performed by Eppie Gibson, MD. It was created on his behalf by Reola Mosher, a trained medical scribe. The creation of this record is based on the scribe's personal observations and the provider's statements to them. This document has been checked and approved by the attending provider.

## 2017-01-11 ENCOUNTER — Telehealth: Payer: Self-pay

## 2017-01-11 ENCOUNTER — Telehealth: Payer: Self-pay | Admitting: Internal Medicine

## 2017-01-11 NOTE — Telephone Encounter (Signed)
Pt called to update Dr Mickeal Skinner on her condition. She has been seizure free, the drowsiness is better. Her balance is still a bit of a challenge. She has started PT. She has completed her steroid taper. She is asking Dr Brand Males opinion on decreasing the keppra?

## 2017-01-11 NOTE — Telephone Encounter (Signed)
lvm per Dr Mickeal Skinner attached message.

## 2017-01-11 NOTE — Telephone Encounter (Signed)
Attempted unsuccessfully to reach Ms. Wos at [phone] (M).  Based on reported improvement from her call to nursing staff, she can drop Keppra down to 500mg  q12 now that [facility] is complete.  If any further seizures, she should call the clinic, at which point we would likely resume the 1000mg  BID dosing.  Will attempt to reach again at a later time.  Ventura Sellers, MD

## 2017-01-13 ENCOUNTER — Telehealth: Payer: Self-pay | Admitting: *Deleted

## 2017-01-13 ENCOUNTER — Other Ambulatory Visit: Payer: Self-pay

## 2017-01-13 ENCOUNTER — Ambulatory Visit: Payer: Medicare Other | Admitting: Physical Therapy

## 2017-01-13 ENCOUNTER — Encounter: Payer: Self-pay | Admitting: Physical Therapy

## 2017-01-13 VITALS — BP 122/75 | HR 90

## 2017-01-13 DIAGNOSIS — C3490 Malignant neoplasm of unspecified part of unspecified bronchus or lung: Secondary | ICD-10-CM

## 2017-01-13 DIAGNOSIS — R29818 Other symptoms and signs involving the nervous system: Secondary | ICD-10-CM

## 2017-01-13 DIAGNOSIS — R2689 Other abnormalities of gait and mobility: Secondary | ICD-10-CM

## 2017-01-13 DIAGNOSIS — M6281 Muscle weakness (generalized): Secondary | ICD-10-CM

## 2017-01-13 NOTE — Patient Instructions (Addendum)
For wheelchair rental (self-pay):  Medical supply stores: [employer] on [facility] [phone]  [place] on [facility] or in [place] [phone] for both locations  [facility] on [address] [phone]  How to Use a [place] How to walk with a walker The best way to walk with a walker depends on whether you are using a standard walker or a front-wheeled walker. A standard walker has rubber tips on the ends of all four legs. A front-wheeled walker has wheels on the ends of the front legs and rubber tips on the ends of the back legs.  Do not use your walker on stairs or an escalator unless you have been trained by a physical therapist or unless your health care provider approves. To Walk With a Front-Wheeled Walker: 1. Slide your front-wheeled walker one step-length in front of you. Your toes should be farther forward than the back legs of your walker. 2. Hold on to the walker for support, and step your weaker leg into the middle of the walker. 3. Step your stronger leg forward to land next to your weaker leg. 4. Repeat the process for each step. Tips  Always keep both feet within the width of the walker's legs or wheels.  When using your walker, you should not feel like you need to lean forward or to the side to keep your hands on the handgrips.  If you have a front-wheeled walker: ? Be careful not to let the walker get too far ahead of you as you walk. ? If your walker does not glide well over carpet, consider cutting an "X" into two tennis balls and placing the balls over the back legs of your walker. How to stand up with a walker 1. Put your walker in front of you. 2. Slide forward in your chair. 3. Position your legs so that your weaker leg is ahead of you and your stronger leg is bent and near your chair. 4. Position your hands. ? If your chair has armrests, put each hand on an armrest. ? If there are no armrests, put the hand  opposite your weaker leg on the chair seat, and put the other hand on the center of the walker's crossbar. 5. Lean forward and push up from your chair. 6. Rise by straightening your stronger leg. 7. Steady yourself. 8. Carefully move your hands to the handgrips of the walker. Tips  Do not pull on the walker when you stand up. This may cause it to tip.  Sit in a firm chair whenever you can. A low seat or an overstuffed chair or sofa is hard to get out of. How to sit down with a walker To Sit Down in a Seat That Has Armrests:  1. Back up toward your seat, using your walker, until you feel the back of your legs touch the chair. 2. Carefully reach your hands behind you and put each hand on an armrest. 3. Slowly lower yourself into the seat. To Sit Down in a Seat Without Armrests: 1. Back up toward the side of the seat, using your walker, until you feel the back of your legs touch the chair. 2. Use one hand to hold on to the back of the chair, and use the other hand to hold on to the front of the seat. 3. Slowly lower yourself into the seat. How to use a walker on a curb or step To Use a Walker to Step Up:  1. Put all four legs of the walker on the curb or step. 2. Get your feet as close to the curb or step as you can. 3. Test the steadiness of the walker by pressing down on the handgrips. 4. If the walker is steady, press down on it with your hands as you step up with your stronger leg. 5. Step up with your weaker leg. To Use a Walker to Step Down : 1. Put all four legs of the walker on the surface that is lower than the curb or step. 2. Get your feet as close to the curb or step as you can. 3. Test the steadiness of the walker by pressing down on the handgrips. 4. If the walker is steady, press down on it with your hands as you step down with your weaker leg. 5. Step down with your stronger leg. This information is not intended to replace advice given to you by your health care  provider. Make sure you discuss any questions you have with your health care provider. Document Released: 04/11/2005 Document Revised: 09/09/2015 Document Reviewed: 10/24/2014 Elsevier Interactive Patient Education  2017 [employer].

## 2017-01-13 NOTE — Telephone Encounter (Signed)
Per Dr. Mickeal Skinner ok to proceed with Keppra dosing of 500 mg BID.    Phoned patient to advise. During the call she advised that she completed her dosing of Decadron 2mg   this past Tuesday.  She states she has been doing PT daily and was seeing some improvement until this morning when she was making the bed, she was struggling with left drop foot.  She states also having increased weakness with the left arm and left leg.  She states she fell this morning but since has continued to do exercises and has improved some throughout the day.  Patient states she already started to decrease the dose of Keppra to 750 mg BID.    Discussed with Dr. Mickeal Skinner and he stated that he feels like she could have had an unwitnessed nocturnal seizure and he would like to go back to the original Keppra dosing of 1000 mg BID and no new orders for Decadron at this point.   Called patient back to explain, left message.

## 2017-01-13 NOTE — Telephone Encounter (Signed)
Spoke with Judson Roch, patients sister and support person.  They stated they received the voicemail and would proceed with taking Keppra 1000 mg BID.

## 2017-01-13 NOTE — Therapy (Signed)
[facility] [address] [place], [place], [address] Phone: [phone]   Fax:  [phone]  Physical Therapy Treatment  Patient Details  Name: Kelly Stuart MRN: [id number] Date of Birth: 11-29-1945 Referring Provider: Cecil Cobbs, DO  Encounter Date: 01/13/2017      PT End of Session - 01/13/17 1747    Visit Number 2   Number of Visits 16   Date for PT Re-Evaluation 03/11/17   Authorization Type G code every 10th visit   PT Start Time 1155   PT Stop Time 1307   PT Time Calculation (min) 72 min   Equipment Utilized During Treatment Gait belt   Activity Tolerance Patient tolerated treatment well   Behavior During Therapy [facility] for tasks assessed/performed      Past Medical History:  Diagnosis Date  . Anxiety   . Breast cancer ([place])    ER+PR+ HER-2NEU negative    Past Surgical History:  Procedure Laterality Date  . BREAST LUMPECTOMY WITH NEEDLE LOCALIZATION  02/29/2012   Procedure: BREAST LUMPECTOMY WITH NEEDLE LOCALIZATION;  Surgeon: Rolm Bookbinder, MD;  Location: [facility];  Service: General;  Laterality: Left;  . BREAST SURGERY    . COSMETIC SURGERY    . DILATION AND CURETTAGE OF UTERUS  1998   submucus myoma-resected  . EYE SURGERY    . FACELIFT W/BLEPHAROPLASTY  1999      . maxillofacial surgery  1980   mouth-ealign teeth-  . minifacelift  2007  . RADIOACTIVE SEED GUIDED EXCISIONAL BREAST BIOPSY N/A 02/11/2016   Procedure: LEFT BREAST RADIOACTIVE SEED GUIDED EXCISIONAL BIOPSY;  Surgeon: Rolm Bookbinder, MD;  Location: [facility];  Service: General;  Laterality: N/A;  . TONSILLECTOMY      Vitals:   01/13/17 1210  BP: 122/75  Pulse: 90        Subjective Assessment - 01/13/17 1159    Subjective Woke up this morning weaker today on her left side and actually fell this morning trying to make her bed. No injury. States she and her sister called her oncologist  to report and he felt it could be she had a seizure last night or due to changes in medications (she recently stopped steroids and decreased her Keppra). She and her sister report MD (relayed via his nurse) that she should come to PT appointment today. Reports yesterday was a great day and she walked alot and did a lot of LUE exercises.    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   Patient Stated Goals "Getting back to walking", also wants to focus on the left arm to gain greater independence, improving balance.   Currently in Pain? No/denies            [facility] PT Assessment - 01/13/17 1729      Strength   Overall Strength Comments L UE is 3-/5 with significant effort to lift against gravity. Lt hip flexion 2+/5, L knee extension 3+/5,                      [facility] Adult PT Treatment/Exercise - 01/13/17 1729      Transfers   Transfers Sit to Stand;Stand to Sit;Stand Pivot Transfers   Sit to Stand 4: Min assist;3: Mod assist;With upper extremity assist;With armrests   Sit to Stand Details Visual cues for safe use of DME/AE;Verbal cues for sequencing;Verbal cues  for technique;Verbal cues for precautions/safety;Verbal cues for safe use of DME/AE;Manual facilitation for weight shifting   Stand to Sit 4: Min assist;With upper extremity assist;With armrests   Stand Pivot Transfers 4: Min assist;With armrests  from w/c to car   Stand Pivot Transfer Details (indicate cue type and reason) sister performed from w/c to car with close guarding by PT   Number of Reps --  6 throughout session   Comments required multiple attempts to achieve standing during 2 transfers     Ambulation/Gait   Ambulation/Gait Assistance 4: Min assist;3: Mod assist   Ambulation/Gait Assistance Details vc for Lt foot placement (tending to adduct/nearly scissor with supination and tendency to roll her ankle  resulting in loss of balance); vc and assist for placing RW   Ambulation Distance (Feet) 16 Feet  x2 in // bars; 20 ft, 15 ft, 20 ft with RW (last with sister assisting with gait belt)   Assistive device Rolling walker;Parallel bars   Gait Pattern Step-to pattern;Step-through pattern;Decreased step length - left;Decreased step length - right;Decreased dorsiflexion - left;Left steppage;Left foot flat;Scissoring;Narrow base of support   Ambulation Surface Level   Stairs --  sister reports addt'l sister helped descend 2 steps out of house to come to PT     Self-Care   Self-Care Other Self-Care Comments   Other Self-Care Comments  Educated in high fall risk with possible fractures. Advised to go to hospital ED for further workup due to significant decline and likely unsafe to be cared for by her sister. Offered to call an ambulance for transport with pt and sister declining. Patient and sister asking what would be done at the hospital and explained potential further workup to understand cause of decline (? brain changes) and would have additional assistance of nurses for mobility.                 PT Education - 01/13/17 1745    Education provided Yes   Education Details high fall risk and potential injuries to herself or caregivers; safe use of RW (pt has one loaned to her by a friend); how to rent a wheelchair--self-pay vs getting MD order to file through insurance   Person(s) Educated Patient;Caregiver(s)   Methods Explanation;Demonstration;Tactile cues;Verbal cues;Handout   Comprehension Verbalized understanding;Returned demonstration;Verbal cues required;Tactile cues required;Need further instruction          PT Short Term Goals - 01/10/17 1338      PT SHORT TERM GOAL #1   Title The patient will be indep with HEP for LE stretching/flexibility, LE strengthening, general mobility.   Time 4   Period Weeks   Target Date 02/09/17     PT SHORT TERM GOAL #2   Title The patient  will improve Berg to > or equal to 44/56 to demo improving balance.   Baseline 41/56   Time 4   Period Weeks   Target Date 02/09/17     PT SHORT TERM GOAL #3   Title The patient will improve gait speed from 1.75 ft/sec to > or equal to 2.1 ft/sec to demo decreased risk for falls.   Baseline 1.75 ft/sec   Time 4   Period Weeks   Target Date 02/09/17     PT SHORT TERM GOAL #4   Title The patient will ambulate with AFO (to assess) modified indep to improve household independence.   Baseline requires CGA   Time 4   Period Weeks   Target Date 02/09/17  PT Long Term Goals - 01/10/17 1340      PT LONG TERM GOAL #1   Title The patient will return demo HEP progression for post d/c activities.   Time 8   Period Weeks   Target Date 03/11/17     PT LONG TERM GOAL #2   Title The patient will improve Berg score to > or equal to 46/56 to demo dec'd risk for falls.   Baseline 41/56   Time 8   Period Weeks   Target Date 03/11/17     PT LONG TERM GOAL #3   Title The patient will improve gait speed to > or equal to 2.3 ft/sec.   Baseline 1.75 ft/sec   Time 8   Period Weeks   Target Date 03/11/17     PT LONG TERM GOAL #4   Title The patient will negotiate 12 steps with one handrail mod indep for household access.   Time 8   Period Weeks   Target Date 03/11/17               Plan - 01/13/17 1750    Clinical Impression Statement Patient presented for first session since her evaluation 9/17 riding in the clinic/lobby wheelchair being pushed by her sister (of note, pt was able to walk into clinic on evaluation). She has experienced a significant decline with increased left-sided weakness and coordination changes. She currently requires minimal to moderate assistance for transfers and walking with a RW. She reports she contacted her physician and was told to come to PT today (currently do not see any documentation of this conversation in her medical record). Advised her  to be seen in the ED due to high fall risk and only able to do limited training with patient and her sister during one treatment session (eventhough session expanded from 40 minutes to 72 minutes). Both patient and sister unwilling to go to the hospital at this time. Educated to call 911 for EMS if she continues to worsen. Will forward information to her referring physician (neuro-oncologist). If pt returns to PT, will continue to assess her status and needs from PT for increased safety and independence.     Rehab Potential Fair   Clinical Impairments Affecting Rehab Potential metastatic disease   PT Frequency 2x / week   PT Duration 8 weeks   PT Treatment/Interventions ADLs/Self Care Home Management;DME Instruction;Gait training;Stair training;Functional mobility training;Therapeutic exercise;Therapeutic activities;Balance training;Neuromuscular re-education;Cognitive remediation;Patient/family education;Orthotic Fit/Training;Energy conservation   PT Next Visit Plan Re-assess strength, balance, coordination. If has declined further, would recommend refer pt back to neuro-oncologist for re-assessment. Otherwise, continue to assess needs for increased safety (?w/c, ?AFO, education on use of RW on steps).    Consulted and Agree with Plan of Care Patient;Family member/caregiver   Family Member Consulted sister      Patient will benefit from skilled therapeutic intervention in order to improve the following deficits and impairments:  Abnormal gait, Decreased balance, Decreased activity tolerance, Impaired tone, Decreased mobility, Decreased strength, Decreased endurance  Visit Diagnosis: Other abnormalities of gait and mobility  Other symptoms and signs involving the nervous system  Muscle weakness (generalized)     Problem List Patient Active Problem List   Diagnosis Date Noted  . Fatigue 01/06/2017  . 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 Brain metastases ([place]) 12/13/2016  . Metastatic lung cancer (metastasis from lung to other site), unspecified laterality ([place]) 11/28/2016  . Lung nodules  11/14/2016  . Primary cancer of upper outer quadrant of left female breast ([place]) 05/05/2011    Jeanie Cooks Tej Murdaugh. PT 01/13/2017, 6:06 PM  [facility] [address] [place], [place], [address] Phone: [phone]   Fax:  [phone]  Name: Kelly Stuart MRN: [id number] Date of Birth: [birth year]

## 2017-01-15 ENCOUNTER — Emergency Department (HOSPITAL_COMMUNITY): Payer: Medicare Other

## 2017-01-15 ENCOUNTER — Encounter (HOSPITAL_COMMUNITY): Payer: Self-pay | Admitting: *Deleted

## 2017-01-15 ENCOUNTER — Inpatient Hospital Stay (HOSPITAL_COMMUNITY)
Admission: EM | Admit: 2017-01-15 | Discharge: 2017-01-18 | DRG: 054 | Disposition: A | Discharge status: Rehabilitation Facility | Payer: Medicare Other | Attending: Internal Medicine | Admitting: Internal Medicine

## 2017-01-15 DIAGNOSIS — R531 Weakness: Secondary | ICD-10-CM | POA: Diagnosis present

## 2017-01-15 DIAGNOSIS — T380X5D Adverse effect of glucocorticoids and synthetic analogues, subsequent encounter: Secondary | ICD-10-CM | POA: Diagnosis not present

## 2017-01-15 DIAGNOSIS — Z885 Allergy status to narcotic agent status: Secondary | ICD-10-CM

## 2017-01-15 DIAGNOSIS — I10 Essential (primary) hypertension: Secondary | ICD-10-CM | POA: Diagnosis present

## 2017-01-15 DIAGNOSIS — C50912 Malignant neoplasm of unspecified site of left female breast: Secondary | ICD-10-CM | POA: Diagnosis present

## 2017-01-15 DIAGNOSIS — C3490 Malignant neoplasm of unspecified part of unspecified bronchus or lung: Secondary | ICD-10-CM | POA: Diagnosis present

## 2017-01-15 DIAGNOSIS — Z87891 Personal history of nicotine dependence: Secondary | ICD-10-CM | POA: Diagnosis not present

## 2017-01-15 DIAGNOSIS — Z79899 Other long term (current) drug therapy: Secondary | ICD-10-CM | POA: Diagnosis not present

## 2017-01-15 DIAGNOSIS — Z9221 Personal history of antineoplastic chemotherapy: Secondary | ICD-10-CM

## 2017-01-15 DIAGNOSIS — I69154 Hemiplegia and hemiparesis following nontraumatic intracerebral hemorrhage affecting left non-dominant side: Secondary | ICD-10-CM | POA: Diagnosis present

## 2017-01-15 DIAGNOSIS — Z882 Allergy status to sulfonamides status: Secondary | ICD-10-CM | POA: Diagnosis not present

## 2017-01-15 DIAGNOSIS — C7931 Secondary malignant neoplasm of brain: Principal | ICD-10-CM | POA: Diagnosis present

## 2017-01-15 DIAGNOSIS — D696 Thrombocytopenia, unspecified: Secondary | ICD-10-CM | POA: Diagnosis present

## 2017-01-15 DIAGNOSIS — G8194 Hemiplegia, unspecified affecting left nondominant side: Secondary | ICD-10-CM | POA: Diagnosis present

## 2017-01-15 DIAGNOSIS — E440 Moderate protein-calorie malnutrition: Secondary | ICD-10-CM | POA: Diagnosis present

## 2017-01-15 DIAGNOSIS — Z800 Family history of malignant neoplasm of digestive organs: Secondary | ICD-10-CM

## 2017-01-15 DIAGNOSIS — I618 Other nontraumatic intracerebral hemorrhage: Secondary | ICD-10-CM | POA: Diagnosis present

## 2017-01-15 DIAGNOSIS — K219 Gastro-esophageal reflux disease without esophagitis: Secondary | ICD-10-CM | POA: Diagnosis present

## 2017-01-15 DIAGNOSIS — G8190 Hemiplegia, unspecified affecting unspecified side: Secondary | ICD-10-CM | POA: Diagnosis not present

## 2017-01-15 DIAGNOSIS — G839 Paralytic syndrome, unspecified: Secondary | ICD-10-CM | POA: Diagnosis not present

## 2017-01-15 DIAGNOSIS — Z8249 Family history of ischemic heart disease and other diseases of the circulatory system: Secondary | ICD-10-CM

## 2017-01-15 DIAGNOSIS — Z791 Long term (current) use of non-steroidal anti-inflammatories (NSAID): Secondary | ICD-10-CM

## 2017-01-15 DIAGNOSIS — C7801 Secondary malignant neoplasm of right lung: Secondary | ICD-10-CM | POA: Diagnosis present

## 2017-01-15 DIAGNOSIS — F419 Anxiety disorder, unspecified: Secondary | ICD-10-CM | POA: Diagnosis present

## 2017-01-15 DIAGNOSIS — K5900 Constipation, unspecified: Secondary | ICD-10-CM | POA: Diagnosis present

## 2017-01-15 DIAGNOSIS — R569 Unspecified convulsions: Secondary | ICD-10-CM

## 2017-01-15 DIAGNOSIS — C7971 Secondary malignant neoplasm of right adrenal gland: Secondary | ICD-10-CM | POA: Diagnosis present

## 2017-01-15 DIAGNOSIS — C7989 Secondary malignant neoplasm of other specified sites: Secondary | ICD-10-CM | POA: Diagnosis present

## 2017-01-15 DIAGNOSIS — Z880 Allergy status to penicillin: Secondary | ICD-10-CM | POA: Diagnosis not present

## 2017-01-15 DIAGNOSIS — Z853 Personal history of malignant neoplasm of breast: Secondary | ICD-10-CM

## 2017-01-15 DIAGNOSIS — C50412 Malignant neoplasm of upper-outer quadrant of left female breast: Secondary | ICD-10-CM | POA: Diagnosis present

## 2017-01-15 DIAGNOSIS — Z881 Allergy status to other antibiotic agents status: Secondary | ICD-10-CM | POA: Diagnosis not present

## 2017-01-15 DIAGNOSIS — I82449 Acute embolism and thrombosis of unspecified tibial vein: Secondary | ICD-10-CM | POA: Diagnosis present

## 2017-01-15 DIAGNOSIS — C801 Malignant (primary) neoplasm, unspecified: Secondary | ICD-10-CM | POA: Diagnosis not present

## 2017-01-15 DIAGNOSIS — Z923 Personal history of irradiation: Secondary | ICD-10-CM | POA: Diagnosis not present

## 2017-01-15 DIAGNOSIS — C7972 Secondary malignant neoplasm of left adrenal gland: Secondary | ICD-10-CM | POA: Diagnosis present

## 2017-01-15 DIAGNOSIS — I619 Nontraumatic intracerebral hemorrhage, unspecified: Secondary | ICD-10-CM | POA: Diagnosis present

## 2017-01-15 DIAGNOSIS — G8192 Hemiplegia, unspecified affecting left dominant side: Secondary | ICD-10-CM | POA: Diagnosis not present

## 2017-01-15 DIAGNOSIS — C3431 Malignant neoplasm of lower lobe, right bronchus or lung: Secondary | ICD-10-CM | POA: Diagnosis present

## 2017-01-15 DIAGNOSIS — I82439 Acute embolism and thrombosis of unspecified popliteal vein: Secondary | ICD-10-CM | POA: Diagnosis present

## 2017-01-15 DIAGNOSIS — C7951 Secondary malignant neoplasm of bone: Secondary | ICD-10-CM | POA: Diagnosis present

## 2017-01-15 DIAGNOSIS — M6289 Other specified disorders of muscle: Secondary | ICD-10-CM | POA: Diagnosis not present

## 2017-01-15 HISTORY — DX: Malignant neoplasm of brain, unspecified: C71.9

## 2017-01-15 HISTORY — DX: Malignant neoplasm of unspecified part of unspecified bronchus or lung: C34.90

## 2017-01-15 LAB — COMPREHENSIVE METABOLIC PANEL WITH GFR
ALT: 51 U/L (ref 14–54)
AST: 45 U/L — ABNORMAL HIGH (ref 15–41)
Albumin: 3.6 g/dL (ref 3.5–5.0)
Alkaline Phosphatase: 119 U/L (ref 38–126)
Anion gap: 8 (ref 5–15)
BUN: 8 mg/dL (ref 6–20)
CO2: 26 mmol/L (ref 22–32)
Calcium: 9.2 mg/dL (ref 8.9–10.3)
Chloride: 101 mmol/L (ref 101–111)
Creatinine, Ser: 0.51 mg/dL (ref 0.44–1.00)
GFR calc Af Amer: >60 mL/min
GFR calc non Af Amer: >60 mL/min
Glucose, Bld: 118 mg/dL — ABNORMAL HIGH (ref 65–99)
Potassium: 4.2 mmol/L (ref 3.5–5.1)
Sodium: 135 mmol/L (ref 135–145)
Total Bilirubin: 1 mg/dL (ref 0.3–1.2)
Total Protein: 7.6 g/dL (ref 6.5–8.1)

## 2017-01-15 LAB — CBC WITH DIFFERENTIAL/PLATELET
Basophils Absolute: 0 10*3/uL (ref 0.0–0.1)
Basophils Relative: 0 %
EOS PCT: 1 %
Eosinophils Absolute: 0.1 10*3/uL (ref 0.0–0.7)
HEMATOCRIT: 40.4 % (ref 36.0–46.0)
Hemoglobin: 13.2 g/dL (ref 12.0–15.0)
LYMPHS PCT: 15 %
Lymphs Abs: 0.7 10*3/uL (ref 0.7–4.0)
MCH: 29 pg (ref 26.0–34.0)
MCHC: 32.7 g/dL (ref 30.0–36.0)
MCV: 88.8 fL (ref 78.0–100.0)
MONO ABS: 0.6 10*3/uL (ref 0.1–1.0)
MONOS PCT: 12 %
NEUTROS ABS: 3.5 10*3/uL (ref 1.7–7.7)
Neutrophils Relative %: 72 %
PLATELETS: 184 10*3/uL (ref 150–400)
RBC: 4.55 MIL/uL (ref 3.87–5.11)
RDW: 14.8 % (ref 11.5–15.5)
WBC: 4.8 10*3/uL (ref 4.0–10.5)

## 2017-01-15 MED ORDER — DEXAMETHASONE SODIUM PHOSPHATE 4 MG/ML IJ SOLN
4.0000 mg | Freq: Once | INTRAMUSCULAR | Status: AC
  Administered 2017-01-15: 4 mg via INTRAVENOUS
  Filled 2017-01-15: qty 1

## 2017-01-15 MED ORDER — IBUPROFEN 200 MG PO TABS
200.0000 mg | ORAL_TABLET | Freq: Two times a day (BID) | ORAL | Status: DC | PRN
  Administered 2017-01-15: 200 mg via ORAL
  Filled 2017-01-15: qty 1

## 2017-01-15 MED ORDER — ACETAMINOPHEN 325 MG PO TABS: 650.0000 mg | ORAL_TABLET | Freq: Four times a day (QID) | ORAL | Status: DC | PRN

## 2017-01-15 MED ORDER — ONDANSETRON HCL 4 MG PO TABS: 4.0000 mg | ORAL_TABLET | Freq: Four times a day (QID) | ORAL | Status: DC | PRN

## 2017-01-15 MED ORDER — AMLODIPINE BESYLATE 5 MG PO TABS
5.0000 mg | ORAL_TABLET | Freq: Every day | ORAL | Status: DC
  Administered 2017-01-16 – 2017-01-18 (×3): 5 mg via ORAL
  Filled 2017-01-15 (×3): qty 1

## 2017-01-15 MED ORDER — DEXAMETHASONE SODIUM PHOSPHATE 10 MG/ML IJ SOLN
4.0000 mg | Freq: Two times a day (BID) | INTRAMUSCULAR | Status: DC
  Filled 2017-01-15: qty 0.4

## 2017-01-15 MED ORDER — ESCITALOPRAM OXALATE 20 MG PO TABS
20.0000 mg | ORAL_TABLET | Freq: Every day | ORAL | Status: DC
  Administered 2017-01-16 – 2017-01-18 (×3): 20 mg via ORAL
  Filled 2017-01-15 (×3): qty 1

## 2017-01-15 MED ORDER — NAPROXEN SODIUM 220 MG PO TABS: 220.0000 mg | ORAL_TABLET | Freq: Every day | ORAL | Status: DC | PRN

## 2017-01-15 MED ORDER — PANTOPRAZOLE SODIUM 40 MG PO TBEC
40.0000 mg | DELAYED_RELEASE_TABLET | Freq: Every day | ORAL | Status: DC
  Administered 2017-01-16 – 2017-01-17 (×2): 40 mg via ORAL
  Filled 2017-01-15 (×2): qty 1

## 2017-01-15 MED ORDER — DEXAMETHASONE SODIUM PHOSPHATE 4 MG/ML IJ SOLN
4.0000 mg | Freq: Two times a day (BID) | INTRAMUSCULAR | Status: DC
  Administered 2017-01-15 – 2017-01-17 (×4): 4 mg via INTRAVENOUS
  Filled 2017-01-15 (×4): qty 1

## 2017-01-15 MED ORDER — ONDANSETRON HCL 4 MG/2ML IJ SOLN: 4.0000 mg | Freq: Four times a day (QID) | INTRAMUSCULAR | Status: DC | PRN

## 2017-01-15 MED ORDER — ACETAMINOPHEN 650 MG RE SUPP: 650.0000 mg | Freq: Four times a day (QID) | RECTAL | Status: DC | PRN

## 2017-01-15 MED ORDER — SODIUM CHLORIDE 0.9 % IV SOLN
1000.0000 mg | Freq: Two times a day (BID) | INTRAVENOUS | Status: DC
  Administered 2017-01-15: 1000 mg via INTRAVENOUS
  Filled 2017-01-15 (×2): qty 10

## 2017-01-15 NOTE — ED Notes (Signed)
Call report to [facility] @ 1555.

## 2017-01-15 NOTE — ED Provider Notes (Signed)
[facility] DEPT Provider Note   CSN: [id number] Arrival date & time: 01/15/17  1208     History   Chief Complaint Chief Complaint  Patient presents with  . Weakness    Left sided    HPI Kelly Stuart is a 71 y.o. female.  HPI Patient presents with left-sided weakness. 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 2 days ago woke up with difficulty moving the left side. Since then has even gotten worse. Seen in physical therapy on Friday. Patient family refused to go to the ER at that time. Now reportedly has gotten a little worse. Has had radiation that finished up around 2 weeks ago. Also had been on steroids but also finished up around a week ago. No confusion. She has had previous focal seizures on the left side but never weakness like this. Also difficulty closing the left eye which is new today is reportedly not in a couple days ago. Past Medical History:  Diagnosis Date  . Anxiety   . Brain cancer ([place])   . Breast cancer ([place])    ER+PR+ HER-2NEU negative  . Lung cancer [facility])     Patient Active Problem List   Diagnosis Date Noted  . Fatigue 01/06/2017  . 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 Brain metastases ([place]) 12/13/2016  . Metastatic lung cancer (metastasis from lung to other site), unspecified laterality ([place]) 11/28/2016  . Lung nodules 11/14/2016  . Primary cancer of upper outer quadrant of left female breast ([place]) 05/05/2011    Past Surgical History:  Procedure Laterality Date  . BREAST LUMPECTOMY WITH NEEDLE LOCALIZATION  02/29/2012   Procedure: BREAST LUMPECTOMY WITH NEEDLE LOCALIZATION;  Surgeon: Rolm Bookbinder, MD;  Location: [place];  Service: General;  Laterality: Left;  . BREAST SURGERY    . COSMETIC SURGERY    . DILATION AND CURETTAGE OF UTERUS  1998   submucus myoma-resected  . EYE SURGERY    . FACELIFT W/BLEPHAROPLASTY  1999      . maxillofacial surgery  1980     mouth-ealign teeth-  . minifacelift  2007  . RADIOACTIVE SEED GUIDED EXCISIONAL BREAST BIOPSY N/A 02/11/2016   Procedure: LEFT BREAST RADIOACTIVE SEED GUIDED EXCISIONAL BIOPSY;  Surgeon: Rolm Bookbinder, MD;  Location: [place];  Service: General;  Laterality: N/A;  . TONSILLECTOMY      OB History    Gravida Para Term Preterm AB Living   0 0 0 0 0 0   SAB TAB Ectopic Multiple Live Births   0 0 0 0        Obstetric Comments   1 adopted son HRT for years stopped in January 2013 Menses age 31       Home Medications    Prior to Admission medications   Medication Sig Start Date End Date Taking? Authorizing Provider  amLODipine (NORVASC) 5 MG tablet Take 5 mg by mouth daily. 11/04/16  Yes [provider]  escitalopram (LEXAPRO) 20 MG tablet Take 20 mg by mouth daily.   Yes [provider]  ibuprofen (ADVIL,MOTRIN) 200 MG tablet Take 200 mg by mouth 2 (two) times daily as needed for headache or moderate pain.   Yes [provider]  levETIRAcetam (KEPPRA) 500 MG tablet Take 2 tablets (1,000 mg total) by mouth 2 (two) times daily. 12/30/16  Yes Vaslow, Acey Lav, MD  naproxen sodium (ANAPROX) 220 MG tablet Take 220 mg by mouth  daily as needed (pain).   Yes [provider]  dexamethasone (DECADRON) 2 MG tablet Take 2 tablets (4 mg total) by mouth 2 (two) times daily with a meal. 4mg  BIDx 4 days, then 2mg  BID x4 days, then 2mg  daily x4 days Patient not taking: Reported on 01/13/2017 12/30/16   Ventura Sellers, MD    Family History Family History  Problem Relation Age of Onset  . Cancer Mother   . Colon cancer Mother   . Cancer Father   . Colon cancer Father   . Hepatitis Father   . Hiatal hernia Father   . Rheumatic fever Sister   . Colon cancer Paternal Aunt     Social History Social History  Substance Use Topics  . Smoking status: Former Smoker    Packs/day: 2.00    Types: Cigarettes    Quit date: 11/01/1991  . Smokeless  tobacco: Never Used  . Alcohol use 1.8 oz/week    3 Cans of beer per week     Comment: drinks beer daily     Allergies   Codeine; Penicillins; Sulfa antibiotics; and Tetracyclines & related   Review of Systems Review of Systems  Constitutional: Negative for appetite change and fatigue.  HENT: Negative for congestion.   Respiratory: Negative for shortness of breath.   Cardiovascular: Negative for chest pain.  Gastrointestinal: Negative for abdominal pain.  Genitourinary: Negative for flank pain.  Musculoskeletal: Negative for back pain.  Neurological: Positive for weakness. Negative for syncope, speech difficulty and numbness.  Hematological: Negative for adenopathy.  Psychiatric/Behavioral: Negative for confusion.     Physical Exam Updated Vital Signs BP 114/77   Pulse 94   Temp 98.4 F (36.9 C) (Oral)   Resp 18   LMP 08/23/2008   SpO2 96%   Physical Exam  Constitutional: She appears well-developed and well-nourished.  HENT:  Head: Atraumatic.  Eyes: EOM are normal.  Decreased blink on left side compared to right.  Neck: Neck supple.  Cardiovascular: Normal rate.   Pulmonary/Chest: Effort normal.  Abdominal: Soft.  Musculoskeletal: She exhibits no edema.  Neurological: She is alert.  Decreased blink the left eye. Equal smile. Strength on left upper and lower extremity much decreased compared to contralateral side. Sensation grossly intact. Really not able to raise left arm on her own. Does have some flexion extension at the ankles but cannot lift her left leg off the bed.  Skin: Skin is warm. Capillary refill takes less than 2 seconds.  Psychiatric: She has a normal mood and affect.     ED Treatments / Results  Labs (all labs ordered are listed, but only abnormal results are displayed) Labs Reviewed  CBC WITH DIFFERENTIAL/PLATELET  COMPREHENSIVE METABOLIC PANEL    EKG  EKG Interpretation None       Radiology Ct Head Wo Contrast  Result Date:  01/15/2017 CLINICAL DATA:  Metastatic  cancer.  Increased left-sided weakness EXAM: CT HEAD WITHOUT CONTRAST TECHNIQUE: Contiguous axial images were obtained from the base of the skull through the vertex without intravenous contrast. COMPARISON:  CT head 12/30/2016, MRI 12/20/2016 and 12/31/2016. FINDINGS: Brain: Hemorrhagic metastatic deposit in the right frontal parietal lobe has increased in size. There has been further hemorrhage since the prior studies. The lesion now measures 4.5 x 4.3 cm with surrounding edema. Local mass-effect without shift of the midline structures. Enhancing metastatic deposit in the left parietal lobe is not identified on CT without contrast but is seen on the prior MRI. No acute infarct.  No other areas of hemorrhage. Negative for hydrocephalus or shift of the midline structures. Vascular: Negative for hyperdense vessel Skull: Negative Sinuses/Orbits: Negative Other: None IMPRESSION: 4.3 x 4.5 cm hemorrhagic metastatic deposit in the right frontal parietal lobe has increased in size since the prior studies. There has been further hemorrhage in the lesion. No shift of the midline structures. Additional metastatic deposit left parietal lobe not identified on CT. These results were called by telephone at the time of interpretation on 01/15/2017 at 2:21 pm to Dr. Davonna Belling , who verbally acknowledged these results. Electronically Signed   By: Franchot Gallo M.D.   On: 01/15/2017 14:18    Procedures Procedures (including critical care time)  Medications Ordered in ED Medications  dexamethasone (DECADRON) injection 4 mg (not administered)     Initial Impression / Assessment and Plan / ED Course  I have reviewed the triage vital signs and the nursing notes.  Pertinent labs & imaging results that were available during my care of the patient were reviewed by me and considered in my medical decision making (see chart for details).     Patient presents with left-sided  weakness for the last 2 days. Has known brain tumors. Has worsened the last day. Has left-sided muscle weakness but intact sensation and intact mental status. CT scan done and shows enlargement of the lesion along with increased bleeding. Discussed with Dr. vasa low from neuro-oncology. Patient will benefit from starting back on Decadron 4 mg every 12 hours and continue her Keppra at 1 g twice a day. Will admit for observation and monitoring for improvement along with assessment of needs and possible placement. Will admit to hospitalist.  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

## 2017-01-15 NOTE — ED Triage Notes (Signed)
Pt woke this morning and noticed to have left sided weakness including decrease blink reflexs of left eye. Earlier she was told it could be related to the radiation.

## 2017-01-15 NOTE — H&P (Signed)
TRH H&P   Patient Demographics:    Kelly Stuart, is a 71 y.o. female  MRN: [id number]   DOB - Aug 26, 1945  Admit Date - 01/15/2017  Outpatient Primary MD for the patient is Jonathon Jordan, MD  Referring MD: Dr Alvino Chapel  Outpatient Specialists:  Dr. Lindi Adie (oncology) Dr Mickeal Skinner( Neuro-oncologist)  Patient coming from: Home  Chief Complaint  Patient presents with  . Weakness    Left sided      HPI:    Kelly Stuart  is a 71 y.o. 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 She was started on Keppra and Decadron and discharged home. Patient being planned to start systemic chemotherapy from October by her oncologist. She had radiation to the brain done on 9/10. Also completed a course of Decadron 2 weeks back. Patient started having weakness of her left side on 9/21. Family called the neuro oncologist who recommended to monitor for any further weakness or seizures. However symptoms worsened and over the past 2 days he was progressively unable to move both her left upper and lower extremities. Denies any headache, blurred vision, dizziness, change in mental status, fever, chills, nausea, vomiting, chest pain, shortness of breath, bowel or urinary symptoms. She denies any tingling or numbness of the extremities. No seizure activity. Denies any fall, difficulty swallowing or choking on food.  In the ED vitals were stable. Labs unremarkable. CT head showed 4.3 x 4.5 cm hemorrhagic metastatic deposit in the right frontal parietal lobe has increased in size since the prior studies. No midline shift noted. ED physician called patient's neuro  oncologist Dr. Mickeal Skinner who recommended IV Decadron 4 mg every 12 hours and continue Keppra. He will see the patient tomorrow. Hospitalist consulted to admit patient to medical floor.     Review of systems:    In addition to the HPI above, No Fever-chills, No Headache, No changes with Vision or hearing, No problems swallowing food or Liquids, No Chest pain, Cough , No Shortness of Breath, No Abdominal pain, No Nausea or Vomiting, Bowel movements are regular, No Blood in stool or Urine, No dysuria, No new skin rashes or bruises, No new joints pains-aches,  Left-sided weakness, no tingling or numbness No recent weight gain or loss, poor appetite. No polyuria, polydypsia or polyphagia, No significant Mental Stressors.  A full 10 point Review of Systems was done, except as stated above, all other Review of Systems were negative.   With Past History of the following :    Past Medical History:  Diagnosis Date  . Anxiety   . Brain cancer ([place])   . Breast cancer ([place])    ER+PR+ HER-2NEU negative  .  Lung cancer [facility])       Past Surgical History:  Procedure Laterality Date  . BREAST LUMPECTOMY WITH NEEDLE LOCALIZATION  02/29/2012   Procedure: BREAST LUMPECTOMY WITH NEEDLE LOCALIZATION;  Surgeon: Rolm Bookbinder, MD;  Location: [place];  Service: General;  Laterality: Left;  . BREAST SURGERY    . COSMETIC SURGERY    . DILATION AND CURETTAGE OF UTERUS  1998   submucus myoma-resected  . EYE SURGERY    . FACELIFT W/BLEPHAROPLASTY  1999      . maxillofacial surgery  1980   mouth-ealign teeth-  . minifacelift  2007  . RADIOACTIVE SEED GUIDED EXCISIONAL BREAST BIOPSY N/A 02/11/2016   Procedure: LEFT BREAST RADIOACTIVE SEED GUIDED EXCISIONAL BIOPSY;  Surgeon: Rolm Bookbinder, MD;  Location: [place];  Service: General;  Laterality: N/A;  . TONSILLECTOMY        Social History:     Social History  Substance Use Topics  . Smoking status:  Former Smoker    Packs/day: 2.00    Types: Cigarettes    Quit date: 11/01/1991  . Smokeless tobacco: Never Used  . Alcohol use 1.8 oz/week    3 Cans of beer per week     Comment: drinks beer daily     Lives - Home with sister  Mobility - independent prior to 2 days ago     Family History :     Family History  Problem Relation Age of Onset  . Cancer Mother   . Colon cancer Mother   . Cancer Father   . Colon cancer Father   . Hepatitis Father   . Hiatal hernia Father   . Rheumatic fever Sister   . Colon cancer Paternal Aunt       Home Medications:   Prior to Admission medications   Medication Sig Start Date End Date Taking? Authorizing Provider  amLODipine (NORVASC) 5 MG tablet Take 5 mg by mouth daily. 11/04/16  Yes [provider]  escitalopram (LEXAPRO) 20 MG tablet Take 20 mg by mouth daily.   Yes [provider]  ibuprofen (ADVIL,MOTRIN) 200 MG tablet Take 200 mg by mouth 2 (two) times daily as needed for headache or moderate pain.   Yes [provider]  levETIRAcetam (KEPPRA) 500 MG tablet Take 2 tablets (1,000 mg total) by mouth 2 (two) times daily. 12/30/16  Yes Vaslow, Acey Lav, MD  naproxen sodium (ANAPROX) 220 MG tablet Take 220 mg by mouth daily as needed (pain).   Yes [provider]  dexamethasone (DECADRON) 2 MG tablet Take 2 tablets (4 mg total) by mouth 2 (two) times daily with a meal. 4mg  BIDx 4 days, then 2mg  BID x4 days, then 2mg  daily x4 days Patient not taking: Reported on 01/13/2017 12/30/16   Ventura Sellers, MD     Allergies:     Allergies  Allergen Reactions  . Codeine Nausea And Vomiting  . Penicillins Rash    Has patient had a PCN reaction causing immediate rash, facial/tongue/throat swelling, SOB or lightheadedness with hypotension: Yes Has patient had a PCN reaction causing severe rash involving mucus membranes or skin necrosis: No Has patient had a PCN reaction that required hospitalization: Yes Has  patient had a PCN reaction occurring within the last 10 years: No If all of the above answers are "NO", then may proceed with Cephalosporin use.   . Sulfa Antibiotics Rash  . Tetracyclines & Related Rash    Patient states Acromycin  Physical Exam:   Vitals  Blood pressure 117/71, pulse 80, temperature 97.8 F (36.6 C), resp. rate 18, last menstrual period 08/23/2008, SpO2 96 %.   Gen.: Elderly female lying in bed not in distress HEENT: Pupils reactive bilaterally, impaired mobility on left lateral gaze, no pallor, no icterus, moist oral mucosa, supple neck Chest: Clear to auscultation bilaterally, no added sounds CVS: Normal S1 and S2, no murmurs rub or gallop GI: Soft, nondistended, nontender, bowel sounds present Musculoskeletal: Warm, no edema CNS: Alert and oriented, pupils reactive bilaterally, impaired left lateral gaze, left facial droop, 3+/5 power over left proximal upper extremity with significant impaired strength over the left hand.3/5 power in left leg, normal reflexes and sensations.   Data Review:    CBC  Recent Labs Lab 01/15/17 1521  WBC 4.8  HGB 13.2  HCT 40.4  PLT 184  MCV 88.8  MCH 29.0  MCHC 32.7  RDW 14.8  LYMPHSABS 0.7  MONOABS 0.6  EOSABS 0.1  BASOSABS 0.0   ------------------------------------------------------------------------------------------------------------------  Chemistries   Recent Labs Lab 01/15/17 1521  NA 135  K 4.2  CL 101  CO2 26  GLUCOSE 118*  BUN 8  CREATININE 0.51  CALCIUM 9.2  AST 45*  ALT 51  ALKPHOS 119  BILITOT 1.0   ------------------------------------------------------------------------------------------------------------------ estimated creatinine clearance is 68.7 mL/min (by C-G formula based on SCr of 0.51 mg/dL). ------------------------------------------------------------------------------------------------------------------ No results for input(s): TSH, T4TOTAL, T3FREE, THYROIDAB in the last  72 hours.  Invalid input(s): FREET3  Coagulation profile No results for input(s): INR, PROTIME in the last 168 hours. ------------------------------------------------------------------------------------------------------------------- No results for input(s): DDIMER in the last 72 hours. -------------------------------------------------------------------------------------------------------------------  Cardiac Enzymes No results for input(s): CKMB, TROPONINI, MYOGLOBIN in the last 168 hours.  Invalid input(s): CK ------------------------------------------------------------------------------------------------------------------ No results found for: BNP   ---------------------------------------------------------------------------------------------------------------  Urinalysis    Component Value Date/Time   BILIRUBINUR n 09/11/2014 0903   PROTEINUR n 09/11/2014 0903   UROBILINOGEN negative 09/11/2014 0903   NITRITE n 09/11/2014 0903   LEUKOCYTESUR Negative 09/11/2014 0903    ----------------------------------------------------------------------------------------------------------------   Imaging Results:    Ct Head Wo Contrast  Result Date: 01/15/2017 CLINICAL DATA:  Metastatic  cancer.  Increased left-sided weakness EXAM: CT HEAD WITHOUT CONTRAST TECHNIQUE: Contiguous axial images were obtained from the base of the skull through the vertex without intravenous contrast. COMPARISON:  CT head 12/30/2016, MRI 12/20/2016 and 12/31/2016. FINDINGS: Brain: Hemorrhagic metastatic deposit in the right frontal parietal lobe has increased in size. There has been further hemorrhage since the prior studies. The lesion now measures 4.5 x 4.3 cm with surrounding edema. Local mass-effect without shift of the midline structures. Enhancing metastatic deposit in the left parietal lobe is not identified on CT without contrast but is seen on the prior MRI. No acute infarct. No other areas of hemorrhage.  Negative for hydrocephalus or shift of the midline structures. Vascular: Negative for hyperdense vessel Skull: Negative Sinuses/Orbits: Negative Other: None IMPRESSION: 4.3 x 4.5 cm hemorrhagic metastatic deposit in the right frontal parietal lobe has increased in size since the prior studies. There has been further hemorrhage in the lesion. No shift of the midline structures. Additional metastatic deposit left parietal lobe not identified on CT. These results were called by telephone at the time of interpretation on 01/15/2017 at 2:21 pm to Dr. Davonna Belling , who verbally acknowledged these results. Electronically Signed   By: Franchot Gallo M.D.   On: 01/15/2017 14:18    My personal review of EKG: Pending  Assessment & Plan:    Principal Problem:   Cancer of left breast / lung metastatic to brain [facility]) Hemorrhagic metastases over right parietal lobe increased from prior imaging with acute left hemiplegia. Admit to telemetry. Started on IV Decadron 4 mg every 12 hours as per recommendation by her new oncologist to ED physician. Increased Keppra to IV 1 g every 12 hours. Seizure precautions and neuro checks. He has been able to swallow without difficulty. Dr Mickeal Skinner will see patient tomorrow. I will also notify Dr.Gudena. PT/OT evaluation. Patient interested in going to rehabilitation.  Active Problems:    Focal seizures ([place]) Continue seizure precautions. IV Keppra twice a day.  Essential hypertension Continue amlodipine.   DVT Prophylaxis : SCDs  AM Labs Ordered, also please review Full Orders  Family Communication: Admission, patients condition and plan of care including tests being ordered have been discussed with the patient and her sisters at bedside   Code Status full code  Likely DC to  SNF  Condition GUARDED    Consults called:  Oncology Neuro oncologist    Admission status: Inpatient  Time spent in minutes : 70   Louellen Molder M.D on 01/15/2017 at 4:07  PM  Between 7am to 7pm - Pager - [phone]. After 7pm go to [email or web address] - password [facility]  [facility] - Office  [phone]

## 2017-01-15 NOTE — ED Notes (Signed)
Bed: WA07 Expected date:  Expected time:  Means of arrival:  Comments: Ca pt

## 2017-01-16 ENCOUNTER — Encounter: Payer: Self-pay | Admitting: Rehabilitative and Restorative Service Providers"

## 2017-01-16 ENCOUNTER — Other Ambulatory Visit: Payer: Medicare Other

## 2017-01-16 ENCOUNTER — Telehealth: Payer: Self-pay | Admitting: Rehabilitative and Restorative Service Providers"

## 2017-01-16 DIAGNOSIS — I619 Nontraumatic intracerebral hemorrhage, unspecified: Secondary | ICD-10-CM | POA: Diagnosis present

## 2017-01-16 DIAGNOSIS — M6289 Other specified disorders of muscle: Secondary | ICD-10-CM

## 2017-01-16 DIAGNOSIS — R569 Unspecified convulsions: Secondary | ICD-10-CM

## 2017-01-16 DIAGNOSIS — G8194 Hemiplegia, unspecified affecting left nondominant side: Secondary | ICD-10-CM

## 2017-01-16 MED ORDER — ADULT MULTIVITAMIN W/MINERALS CH
1.0000 | ORAL_TABLET | Freq: Every day | ORAL | Status: DC
  Administered 2017-01-16 – 2017-01-18 (×3): 1 via ORAL
  Filled 2017-01-16 (×3): qty 1

## 2017-01-16 MED ORDER — PREMIER PROTEIN SHAKE
11.0000 [oz_av] | ORAL | Status: DC
  Administered 2017-01-16: 11 [oz_av] via ORAL
  Filled 2017-01-16 (×3): qty 325.31

## 2017-01-16 MED ORDER — LEVETIRACETAM 500 MG PO TABS
1000.0000 mg | ORAL_TABLET | Freq: Two times a day (BID) | ORAL | Status: DC
  Administered 2017-01-16 – 2017-01-18 (×5): 1000 mg via ORAL
  Filled 2017-01-16 (×6): qty 2

## 2017-01-16 MED ORDER — LORAZEPAM 0.5 MG PO TABS
0.5000 mg | ORAL_TABLET | Freq: Every evening | ORAL | Status: DC | PRN
  Administered 2017-01-16 – 2017-01-17 (×2): 0.5 mg via ORAL
  Filled 2017-01-16 (×2): qty 1

## 2017-01-16 NOTE — Therapy (Signed)
[facility] [address] [place], [place], [address] Phone: [phone]   Fax:  [phone]  Patient Details  Name: Kelly Stuart MRN: [id number] Date of Birth: 17-Sep-1945 Referring Provider:  Dr. Cecil Cobbs, MD  Encounter Date: 01/16/2017  PHYSICAL THERAPY DISCHARGE SUMMARY  Visits from Start of Care: 2  Current functional level related to goals / functional outcomes: Discharged due to change in medical status.   Remaining deficits: See note 9/21 for patient status.   Education / Equipment: Insurance claims handler.  Plan: Patient agrees to discharge.  Patient goals were not met. Patient is being discharged due to a change in medical status.  ?????         Thank you for the referral of this patient. Rudell Cobb, MPT   Kelly Stuart 01/16/2017, 2:34 PM  [facility] [address] [place], [place], [address] Phone: [phone]   Fax:  [phone]

## 2017-01-16 NOTE — Telephone Encounter (Signed)
Called patient's sister to check on Kelly Stuart and she reported they did go to the hospital over the weekend.  She reports she is under care at the hospital and has already seen PT and OT.   PT recommended we cancel all OP visits for now and let her sister know her MD would order when/if it is appropriate for her to return to OP therapy.  Kelly Stuart, PT

## 2017-01-16 NOTE — Progress Notes (Signed)
PHARMACIST - PHYSICIAN COMMUNICATION  DR:   Broadus John  CONCERNING: IV to Oral Route Change Policy  RECOMMENDATION: This patient is receiving keppra by the intravenous route.  Based on criteria approved by the Pharmacy and Therapeutics Committee, the intravenous medication(s) is/are being converted to the equivalent oral dose form(s).   DESCRIPTION: These criteria include:  The patient is eating (either orally or via tube) and/or has been taking other orally administered medications for a least 24 hours  The patient has no evidence of active gastrointestinal bleeding or impaired GI absorption (gastrectomy, short bowel, patient on TNA or NPO).  If you have questions about this conversion, please contact the Pharmacy Department  []   [phone] )  [facility] []   [phone] )  [facility] []   [phone] )  [facility] []   [phone] )  [facility] [x]   [phone] )  [place] 01/16/2017 10:04 AM

## 2017-01-16 NOTE — Progress Notes (Signed)
PROGRESS NOTE    Kelly Stuart  HCW:[id number] DOB: 1945/12/08 DOA: 01/15/2017 PCP: Jonathon Jordan, MD  Brief Narrative: Kelly Stuart  is a 71 y.o. female prior h/o breast CA in 2013 s/p chemo  followed by lumpectomy.. 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 She was started on Keppra and Decadron and discharged home. Patient being planned to start systemic chemotherapy from October by her oncologist. She had radiation to the brain done on 9/10 and completed a course of Decadron 2 weeks back. Patient started having weakness of her left side on 9/21 symptoms worsened and over the past 2 days he was progressively unable to move both her left arm and legs.  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  Essential hypertension Continue amlodipine.  DVT Prophylaxis : SCDs  Code Status: Full code Family Communication: Sister at bedside  Disposition Plan: Will likely need rehabilitation  Consultants:   Dr. Mickeal Skinner  Dr. Sonny Dandy   Procedures:   Antimicrobials:    Subjective: Upset about L side  Objective: Vitals:   01/15/17 1657 01/15/17 2059 01/16/17 0430 01/16/17 1255  BP: 105/62 108/67 110/88 103/68  Pulse: 90 86 65 82  Resp: 18 18 18 18   Temp: 97.7 F (36.5 C) 97.8 F (36.6 C) 98.3 F (36.8 C) 98.1 F (36.7 C)  TempSrc: Oral Oral Oral Oral  SpO2: 96% 95% 98% 97%    Weight:   71.1 kg (156 lb 12 oz)   Height:   5\' 10"  (1.778 m)    No intake or output data in the 24 hours ending 01/16/17 1307 Filed Weights   01/16/17 0430  Weight: 71.1 kg (156 lb 12 oz)    Examination:  General exam: anxious tearful, alert, awake Respiratory system: Clear to auscultation. Respiratory effort normal. Cardiovascular system: S1 & S2 heard, RRR. No JVD, murmurs, rubs, gallops Gastrointestinal system: Abdomen is nondistended, soft and nontender.Normal bowel sounds heard. Central nervous system: Alert and oriented. LUE and LLE flaccid and L hemineglect noted Extremities: Symmetric 5 x 5 power. Skin: No rashes, lesions or ulcers Psychiatry: anxious    Data Reviewed:   CBC:  Recent Labs Lab 01/15/17 1521  WBC 4.8  NEUTROABS 3.5  HGB 13.2  HCT 40.4  MCV 88.8  PLT 176   Basic Metabolic Panel:  Recent Labs Lab 01/15/17 1521  NA 135  K 4.2  CL 101  CO2 26  GLUCOSE 118*  BUN 8  CREATININE 0.51  CALCIUM 9.2   GFR: Estimated Creatinine Clearance: 68.7 mL/min (by C-G formula based on SCr of 0.51 mg/dL). Liver Function Tests:  Recent Labs Lab 01/15/17 1521  AST 45*  ALT 51  ALKPHOS 119  BILITOT 1.0  PROT 7.6  ALBUMIN 3.6   No results for input(s): LIPASE, AMYLASE in the last 168 hours. No results for input(s): AMMONIA in the last 168 hours. Coagulation Profile: No results for input(s): INR, PROTIME in the last 168  hours. Cardiac Enzymes: No results for input(s): CKTOTAL, CKMB, CKMBINDEX, TROPONINI in the last 168 hours. BNP (last 3 results) No results for input(s): PROBNP in the last 8760 hours. HbA1C: No results for input(s): HGBA1C in the last 72 hours. CBG: No results for input(s): GLUCAP in the last 168 hours. Lipid Profile: No results for input(s): CHOL, HDL, LDLCALC, TRIG, CHOLHDL, LDLDIRECT in the last 72 hours. Thyroid Function Tests: No results for input(s): TSH, T4TOTAL, FREET4, T3FREE, THYROIDAB in the last 72 hours. Anemia  Panel: No results for input(s): VITAMINB12, FOLATE, FERRITIN, TIBC, IRON, RETICCTPCT in the last 72 hours. Urine analysis:    Component Value Date/Time   BILIRUBINUR n 09/11/2014 0903   PROTEINUR n 09/11/2014 0903   UROBILINOGEN negative 09/11/2014 0903   NITRITE n 09/11/2014 0903   LEUKOCYTESUR Negative 09/11/2014 0903   Sepsis Labs: @LABRCNTIP (procalcitonin:4,lacticidven:4)  )No results found for this or any previous visit (from the past 240 hour(s)).       Radiology Studies: Ct Head Wo Contrast  Result Date: 01/15/2017 CLINICAL DATA:  Metastatic  cancer.  Increased left-sided weakness EXAM: CT HEAD WITHOUT CONTRAST TECHNIQUE: Contiguous axial images were obtained from the base of the skull through the vertex without intravenous contrast. COMPARISON:  CT head 12/30/2016, MRI 12/20/2016 and 12/31/2016. FINDINGS: Brain: Hemorrhagic metastatic deposit in the right frontal parietal lobe has increased in size. There has been further hemorrhage since the prior studies. The lesion now measures 4.5 x 4.3 cm with surrounding edema. Local mass-effect without shift of the midline structures. Enhancing metastatic deposit in the left parietal lobe is not identified on CT without contrast but is seen on the prior MRI. No acute infarct. No other areas of hemorrhage. Negative for hydrocephalus or shift of the midline structures. Vascular: Negative for hyperdense vessel Skull: Negative Sinuses/Orbits: Negative Other: None IMPRESSION: 4.3 x 4.5 cm hemorrhagic metastatic deposit in the right frontal parietal lobe has increased in size since the prior studies. There has been further hemorrhage in the lesion. No shift of the midline structures. Additional metastatic deposit left parietal lobe not identified on CT. These results were called by telephone at the time of interpretation on 01/15/2017 at 2:21 pm to Dr. Davonna Belling , who verbally acknowledged these results. Electronically Signed   By: Franchot Gallo M.D.   On: 01/15/2017 14:18        Scheduled Meds: . amLODipine  5 mg Oral Daily  . dexamethasone  4 mg Intravenous Q12H  . escitalopram  20 mg Oral Daily  . levETIRAcetam  1,000 mg Oral BID  . pantoprazole  40 mg Oral Daily   Continuous Infusions:   LOS: 1 day    Time spent: 27min    Domenic Polite, MD [facility] Pager [phone]  If 7PM-7AM, please contact night-coverage [email or web address] Password [email or web address] 01/16/2017, 1:07 PM

## 2017-01-16 NOTE — Consult Note (Signed)
[place] NOTE  Patient Care Team: Jonathon Jordan, MD as PCP - General (Family Medicine)  CHIEF COMPLAINTS/PURPOSE OF CONSULTATION:  Left sided weakness  HISTORY OF PRESENTING ILLNESS:  Kelly Stuart 71 y.o. female is here because of worsening left sided weakness.  She describes waking up on the morning of 9/21 with worsening left arm and leg weakness, which had recently improved to near baseline after treatment of focal seizures.  She also felt 'cloudy' and not clear with her thinking.  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  In addition she noted difficulty using her phone/texting and sister felt she was ignoring her left side.  This prompted her visit to the ED, where a CT head demonstrated enlargement of the lesion due to hemorrhage.    MEDICAL HISTORY:  Past Medical History:  Diagnosis Date  . Anxiety   . Brain cancer ([place])   . Breast cancer ([place])    ER+PR+ HER-2NEU negative  . Lung cancer [facility])     SURGICAL HISTORY: Past Surgical History:  Procedure Laterality Date  . BREAST LUMPECTOMY WITH NEEDLE LOCALIZATION  02/29/2012   Procedure: BREAST LUMPECTOMY WITH NEEDLE LOCALIZATION;  Surgeon: Rolm Bookbinder, MD;  Location: [place];  Service: General;  Laterality: Left;  . BREAST SURGERY    . COSMETIC SURGERY    . DILATION AND CURETTAGE OF UTERUS  1998   submucus myoma-resected  . EYE SURGERY    . FACELIFT W/BLEPHAROPLASTY  1999      . maxillofacial surgery  1980   mouth-ealign teeth-  . minifacelift  2007  . RADIOACTIVE SEED GUIDED EXCISIONAL BREAST BIOPSY N/A 02/11/2016   Procedure: LEFT BREAST RADIOACTIVE SEED GUIDED EXCISIONAL BIOPSY;  Surgeon: Rolm Bookbinder, MD;  Location: [facility];  Service: General;  Laterality: N/A;  .  TONSILLECTOMY      SOCIAL HISTORY: Social History   Social History  . Marital status: Divorced    Spouse name: N/A  . Number of children: 1  . Years of education: N/A   Occupational History  .  [employer]iological scientist  .      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 Smoking status: Former Smoker    Packs/day: 2.00    Types: Cigarettes    Quit date: 11/01/1991  . Smokeless tobacco: Never Used  . Alcohol use 1.8 oz/week    3 Cans of beer per week     Comment: drinks beer daily  . Drug use: No  . Sexual activity: Yes    Partners: Male   Other Topics Concern  . Not on file   Social History Narrative  . No narrative on file    FAMILY HISTORY: Family History  Problem Relation Age of Onset  . Cancer Mother   . Colon cancer Mother   . Cancer Father   . Colon cancer Father   . Hepatitis Father   . Hiatal hernia Father   . Rheumatic fever Sister   . Colon cancer Paternal Aunt     ALLERGIES:  is allergic to codeine; penicillins; sulfa antibiotics; and tetracyclines & related.  MEDICATIONS:  Current Facility-Administered Medications  Medication Dose Route Frequency Provider Last Rate Last Dose  . acetaminophen (TYLENOL) tablet 650 mg  650 mg Oral Q6H  PRN Dhungel, Nishant, MD       Or  . acetaminophen (TYLENOL) suppository 650 mg  650 mg Rectal Q6H PRN Dhungel, Nishant, MD      . amLODipine (NORVASC) tablet 5 mg  5 mg Oral Daily Dhungel, Nishant, MD      . dexamethasone (DECADRON) injection 4 mg  4 mg Intravenous Q12H Dhungel, Nishant, MD   4 mg at 01/15/17 2107  . escitalopram (LEXAPRO) tablet 20 mg  20 mg Oral Daily Dhungel, Nishant, MD      . levETIRAcetam (KEPPRA) 1,000 mg in sodium chloride 0.9 % 100 mL IVPB  1,000 mg Intravenous Q12H Dhungel, Nishant, MD   Stopped at 01/16/17 0011  . LORazepam (ATIVAN) tablet 0.5 mg  0.5 mg Oral QHS PRN Domenic Polite, MD      . ondansetron [facility]) tablet 4 mg  4 mg Oral Q6H PRN  Dhungel, Nishant, MD       Or  . ondansetron (ZOFRAN) injection 4 mg  4 mg Intravenous Q6H PRN Dhungel, Nishant, MD      . pantoprazole (PROTONIX) EC tablet 40 mg  40 mg Oral Daily Dhungel, Nishant, MD        REVIEW OF SYSTEMS:   Constitutional: Denies fevers, chills or abnormal weight loss Eyes: Denies blurriness of vision Ears, nose, mouth, throat, and face: Denies mucositis or sore throat Respiratory: Denies cough, dyspnea or wheezes Cardiovascular: Denies palpitation, chest discomfort or lower extremity swelling Gastrointestinal:  Denies nausea, constipation, diarrhea GU: Denies dysuria or incontinence Skin: Denies abnormal skin rashes Neurological: Per HPI Musculoskeletal: Denies joint pain, back or neck discomfort. No decrease in ROM Behavioral/Psych: +anxiety, restlessness  PHYSICAL EXAMINATION: Vitals:   01/15/17 2059 01/16/17 0430  BP: 108/67 110/88  Pulse: 86 65  Resp: 18 18  Temp: 97.8 F (36.6 C) 98.3 F (36.8 C)  SpO2: 95% 98%   KPS: 50. General: Alert, anxious Head: Normal EENT: No conjunctival injection or scleral icterus. Oral mucosa moist Lungs: Resp effort normal Cardiac: Regular rate and rhythm Abdomen: Soft, non-distended abdomen Skin: No rashes cyanosis or petechiae. Extremities: No clubbing or edema  NEUROLOGIC EXAM: Mental Status: Awake, alert, attentive to examiner. Oriented to self and location. Language is fluent with intact comprehension.  There is notable left environmental neglect. Cranial Nerves: Visual acuity is grossly normal. Visual fields are full but with some extinction on left. Extra-ocular movements intact with clear right gaze preference. No ptosis. Face is symmetric, tongue midline. Motor: Tone and bulk are normal. Power is impaired in left arm and leg, 2/5. Reflexes are symmetric, no pathologic reflexes present.  Sensory: Intact to light touch without extinction Gait: Deferred  LABORATORY DATA:  I have reviewed the data as  listed Lab Results  Component Value Date   WBC 4.8 01/15/2017   HGB 13.2 01/15/2017   HCT 40.4 01/15/2017   MCV 88.8 01/15/2017   PLT 184 01/15/2017    Recent Labs  11/21/16 1038 12/30/16 2158 01/15/17 1521  NA 139 135 135  K 4.1 4.1 4.2  CL 107 103 101  CO2 25 23 26   GLUCOSE 114* 162* 118*  BUN 8 7 8   CREATININE 0.63 0.51 0.51  CALCIUM 8.8* 9.3 9.2  GFRNONAA >60 >60 >60  GFRAA >60 >60 >60  PROT 7.0  --  7.6  ALBUMIN 3.4*  --  3.6  AST 24  --  45*  ALT 13*  --  51  ALKPHOS 87  --  119  BILITOT 1.1  --  1.0    RADIOGRAPHIC STUDIES: I have personally reviewed the radiological images as listed and agreed with the findings in the report. Ct Head Wo Contrast  Result Date: 01/15/2017 CLINICAL DATA:  Metastatic  cancer.  Increased left-sided weakness EXAM: CT HEAD WITHOUT CONTRAST TECHNIQUE: Contiguous axial images were obtained from the base of the skull through the vertex without intravenous contrast. COMPARISON:  CT head 12/30/2016, MRI 12/20/2016 and 12/31/2016. FINDINGS: Brain: Hemorrhagic metastatic deposit in the right frontal parietal lobe has increased in size. There has been further hemorrhage since the prior studies. The lesion now measures 4.5 x 4.3 cm with surrounding edema. Local mass-effect without shift of the midline structures. Enhancing metastatic deposit in the left parietal lobe is not identified on CT without contrast but is seen on the prior MRI. No acute infarct. No other areas of hemorrhage. Negative for hydrocephalus or shift of the midline structures. Vascular: Negative for hyperdense vessel Skull: Negative Sinuses/Orbits: Negative Other: None IMPRESSION: 4.3 x 4.5 cm hemorrhagic metastatic deposit in the right frontal parietal lobe has increased in size since the prior studies. There has been further hemorrhage in the lesion. No shift of the midline structures. Additional metastatic deposit left parietal lobe not identified on CT. These results were called by  telephone at the time of interpretation on 01/15/2017 at 2:21 pm to Dr. Davonna Belling , who verbally acknowledged these results. Electronically Signed   By: Franchot Gallo M.D.   On: 01/15/2017 14:18   Ct Head Wo Contrast  Result Date: 12/30/2016 CLINICAL DATA:  Patient had radiation therapy to the brain today and this evening noticed abnormal gait. Loss of cord nasion in the left leg. History of seizures and lung cancer with brain metastasis. EXAM: CT HEAD WITHOUT CONTRAST TECHNIQUE: Contiguous axial images were obtained from the base of the skull through the vertex without intravenous contrast. COMPARISON:  MRI brain 12/20/2016 FINDINGS: Brain: Mass lesion in the right posterior frontal lobe with central necrosis measuring 3.6 cm diameter. Mild surrounding vasogenic edema. 1.1 cm diameter mass lesion along the left medial parietal cortical gyrus. Both of these lesions can be seen on the previous MRI examination. Additional tiny lesion in the left parietal cortex is not identified at CT. There is mild effacement of right frontal sulci caused by the mass lesion but without change since the MRI. No midline shift. No abnormal extra-axial fluid collections. Gray-white matter junctions are distinct. Basal cisterns are not effaced. Ventricles are not dilated or effaced. No acute intracranial hemorrhage. Vascular: No hyperdense vessel or unexpected calcification. Skull: Normal. Negative for fracture or focal lesion. Sinuses/Orbits: No acute finding. Other: None. IMPRESSION: Known metastatic lesions identified in the right posterior frontal low measuring 3.6 cm diameter and in the left medial parietal lobe measuring 1.1 cm diameter. Mild vasogenic edema. No increase in mass effect and no midline shift. No acute intracranial hemorrhage. Electronically Signed   By: Lucienne Capers M.D.   On: 12/30/2016 22:21   Mr Jeri Cos SA Contrast  Result Date: 12/31/2016 CLINICAL DATA:  Ataxia. Rule out stroke. History of  metastatic disease EXAM: MRI HEAD WITHOUT AND WITH CONTRAST MRI CERVICAL SPINE WITHOUT AND WITH CONTRAST TECHNIQUE: Multiplanar, multiecho pulse sequences of the brain and surrounding structures, and cervical spine, to include the craniocervical junction and cervicothoracic junction, were obtained without and with intravenous contrast. CONTRAST:  15 mL MultiHance IV COMPARISON:  MRI head 12/20/2016 FINDINGS: MRI HEAD FINDINGS Brain: Image quality degraded by motion Large enhancing metastatic deposit in the  right parietal lobe shows interval growth now measuring 37 x 38 mm, compared with 30 x 34 mm previously. Mild surrounding edema unchanged. Moderate intra tumor hemorrhage unchanged. Hemorrhagic metastatic deposit left medial parietal lobe mildly larger now measuring 12 mm compared with 10.5 x 8.3 mm previously. Mild intra tumor hemorrhage unchanged. 4 mm left parietal cortical metastatic disease unchanged. Negative for hydrocephalus or midline shift. Mild mass-effect on the right ventricle due to the right parietal tumor. No acute ischemic infarction. Vascular: Normal arterial flow void Skull and upper cervical spine: Negative Sinuses/Orbits: Negative Other: None MRI CERVICAL SPINE FINDINGS Image quality degraded by significant motion. Alignment: Normal alignment.  Mild kyphosis Vertebrae: Negative for fracture or metastatic disease in the cervical spine Cord: Normal cord signal.  No enhancing lesion in the cord Posterior Fossa, vertebral arteries, paraspinal tissues: Negative Disc levels: Disc degeneration and spondylosis at C4-5, C5-6, and C6-7 causing foraminal narrowing bilaterally. No cord compression. IMPRESSION: Progression of metastatic disease in the parietal lobe bilaterally. Intra tumor hemorrhage is present as noted previously. No new metastatic disease in the brain. No acute infarct Negative for cervical spine metastatic disease.  No cord compression Image quality degraded by motion. Electronically  Signed   By: Franchot Gallo M.D.   On: 12/31/2016 10:53   Mr Jeri Cos OE Contrast  Result Date: 12/20/2016 CLINICAL DATA:  Secondary malignant neoplasm of the brain and spinal cord. Hemorrhagic metastasis secondary to lung cancer. SRS planning. EXAM: MRI HEAD WITHOUT AND WITH CONTRAST TECHNIQUE: Multiplanar, multiecho pulse sequences of the brain and surrounding structures were obtained without and with intravenous contrast. CONTRAST:  45mL MULTIHANCE GADOBENATE DIMEGLUMINE 529 MG/ML IV SOLN COMPARISON:  MRI brain 12/03/2016 at [facility]. FINDINGS: Brain: A hemorrhagic lesion in the posterior right frontal lobe sixth has increased in size since the prior study. This likely reflects interval hemorrhage there is peripheral enhancement of this lesion which now measures 3.4 x 3.0 x 3.4 cm. A lesion in the medial left parietal lobe is also slightly larger than on the prior exam, now measuring 1.0 x 0.8 x 1.0 cm. Minimal hemorrhage is associated with the left parietal lesion. A 4 mm left parietal lesion is not well seen due to patient motion on the prior exam. There is significant surrounding vasogenic edema with each of these lesions. Other scattered periventricular and subcortical T2 changes bilaterally likely reflect the sequela of chronic microvascular ischemia. No acute infarct is present. The brainstem and cerebellum are within normal limits. Vascular: Flow is present in the major intracranial arteries. Skull and upper cervical spine: The skullbase is within normal limits. The craniocervical junction is normal. Degenerative changes are present the upper cervical spine. No definite osseous lesions are evident. Sinuses/Orbits: The paranasal sinuses and mastoid air cells are clear. The globes and orbits are unremarkable. IMPRESSION: 1. Interval increase in size of posterior right frontal lobe and medial left parietal lesions previously described. This likely reflects interval hemorrhage of the right  frontal lesion and possibly in the left parietal lesion. 2. Additional 4 mm lesion noted in the left parietal lobe on image 112 of series 12. Electronically Signed   By: San Morelle M.D.   On: 12/20/2016 16:25   Mr Cervical Spine W Wo Contrast  Result Date: 12/31/2016 CLINICAL DATA:  Ataxia. Rule out stroke. History of metastatic disease EXAM: MRI HEAD WITHOUT AND WITH CONTRAST MRI CERVICAL SPINE WITHOUT AND WITH CONTRAST TECHNIQUE: Multiplanar, multiecho pulse sequences of the brain and surrounding structures, and cervical spine,  to include the craniocervical junction and cervicothoracic junction, were obtained without and with intravenous contrast. CONTRAST:  15 mL MultiHance IV COMPARISON:  MRI head 12/20/2016 FINDINGS: MRI HEAD FINDINGS Brain: Image quality degraded by motion Large enhancing metastatic deposit in the right parietal lobe shows interval growth now measuring 37 x 38 mm, compared with 30 x 34 mm previously. Mild surrounding edema unchanged. Moderate intra tumor hemorrhage unchanged. Hemorrhagic metastatic deposit left medial parietal lobe mildly larger now measuring 12 mm compared with 10.5 x 8.3 mm previously. Mild intra tumor hemorrhage unchanged. 4 mm left parietal cortical metastatic disease unchanged. Negative for hydrocephalus or midline shift. Mild mass-effect on the right ventricle due to the right parietal tumor. No acute ischemic infarction. Vascular: Normal arterial flow void Skull and upper cervical spine: Negative Sinuses/Orbits: Negative Other: None MRI CERVICAL SPINE FINDINGS Image quality degraded by significant motion. Alignment: Normal alignment.  Mild kyphosis Vertebrae: Negative for fracture or metastatic disease in the cervical spine Cord: Normal cord signal.  No enhancing lesion in the cord Posterior Fossa, vertebral arteries, paraspinal tissues: Negative Disc levels: Disc degeneration and spondylosis at C4-5, C5-6, and C6-7 causing foraminal narrowing bilaterally.  No cord compression. IMPRESSION: Progression of metastatic disease in the parietal lobe bilaterally. Intra tumor hemorrhage is present as noted previously. No new metastatic disease in the brain. No acute infarct Negative for cervical spine metastatic disease.  No cord compression Image quality degraded by motion. Electronically Signed   By: Franchot Gallo M.D.   On: 12/31/2016 10:53   Mr Thoracic Spine W Wo Contrast  Result Date: 12/31/2016 CLINICAL DATA:  Metastatic disease.  Ataxia.  Pain EXAM: MRI THORACIC WITHOUT AND WITH CONTRAST TECHNIQUE: Multiplanar and multiecho pulse sequences of the thoracic spine were obtained without and with intravenous contrast. CONTRAST:  15 mL MultiHance IV COMPARISON:  CT chest 11/02/2016 FINDINGS: MRI THORACIC SPINE FINDINGS Image quality degraded by significant motion. Alignment:  Normal Vertebrae: Negative for vertebral body fracture. Bony metastatic disease is present however small lesions could be missed given the amount of motion. Metastatic disease in the left pedicles of T6 and T7 as well as in the left seventh rib with associated soft tissue mass. Metastatic disease in the posterior elements of T12 with mild posterior epidural tumor. Epidural tumor is causing mild spinal stenosis and displacement of the cord anteriorly. Tumor extends into the right pedicle and vertebral body of T12 Cord:  Negative for cord compression.  Cord signal normal. Paraspinal and other soft tissues: Bony metastatic disease as above. Right lower lobe lung mass as noted on prior CT. Disc levels: Negative for disc degeneration or disc protrusion. IMPRESSION: Bony metastatic disease as above. 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 Negative for cord compression.  No pathologic fracture. Electronically Signed   By: Franchot Gallo M.D.   On: 12/31/2016 10:39    ASSESSMENT & PLAN:   Right frontoparietal intracerebral hemorrhage With cerebral edema.  Kelly Stuart symptoms are  very likely secondary to hemorrhagic conversion of her recently treated metastasis overlying the right primary motor cortex.  This could be unprovoked, or provoked by a combination of NSAID use and recent radiosurgery.  The volume change seen in the CT can likewise be explained by blood products rather than tumor progression or even radiation treatment effect.    She should continue decadron 4mg  BID while inpatient, and can transition to 4mg  daily upon discharge to rehab or SNF.  There is only a moderate component of edema and  no midline shift noted.  Decadron should be taken with a PPI.  Keppra should be continued at the current dose.  I told her to avoid NSAIDs for the foreseeable future.    Main goal moving forward will be rehabbing her decompensated deficits so she can be lined up for systemic therapy as soon as possible.  During rehab the visual/spatial neglect should be addressed in addition to the motor deficits, as this is likely reversible.  No need for further workup or imaging at this time.      All questions were answered. The patient knows to call the clinic with any problems, questions or concerns. I spent 30 minutes counseling the patient face to face. The total time spent in the appointment was 60 minutes and more than 50% was on counseling.     Ventura Sellers, MD 01/16/2017 9:51 AM

## 2017-01-16 NOTE — Progress Notes (Signed)
Assumed care of patient at this time. Patient is stable with no complaints at this time. Agree with previously documented assessment. Will continue to monitor patient. Patient is sleeping comfortably.

## 2017-01-16 NOTE — Progress Notes (Addendum)
Initial Nutrition Assessment  DOCUMENTATION CODES:   Non-severe (moderate) malnutrition in context of chronic illness  INTERVENTION:  - Will trial Premier Protein once/day, this supplement provides 160 kcal and 30 grams of protein. - Continue to encourage PO intakes of meals and supplements. - RD will continue to monitor for additional nutrition-related needs, especially when/if pt begins chemo.   NUTRITION DIAGNOSIS:   Malnutrition (moderate/non-severe) related to chronic illness, catabolic illness, cancer and cancer related treatments as evidenced by estimated needs.  GOAL:   Patient will meet greater than or equal to 90% of their needs  MONITOR:   PO intake, Supplement acceptance, Weight trends, Labs  REASON FOR ASSESSMENT:   Malnutrition Screening Tool  ASSESSMENT:   71 y.o. 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 She was started on Keppra and Decadron and discharged home. Patient being planned to start systemic chemotherapy from October by her oncologist. She had radiation to the brain done on 9/10. Patient started having weakness of her left side on 9/21. Family called the neuro oncologist who recommended to monitor for any further weakness or seizures. However symptoms worsened and over the past 2 days he was progressively unable to move both her left upper and lower extremities.   Pt seen for MST. BMI indicates normal weight. No intakes documented since admission; visualized lunch tray with ~50% completion. Pt reports that she is feeling hungry today and that appetite has been improving the past few days. She completed radiation 2 weeks ago and was also on PO steroids x2 weeks which were finished around the same time as the end  of radiation. Pt reports that she was experiencing pain and decreased appetite associated with radiation and that PO steroid caused nausea which also decreased appetite and desire to eat. She reports that is currently receiving IV steroid and does not have any side effects from this.   She does hot yoga when she is strong enough and is very determined to get back to this practice; pt and RD talked about this topic as it seemed to bring pt happiness.   She is having L-sided weakness, but is R-handed and denies weakness affecting her ability to self feed and no noted difficulties with chewing or swallowing related to weakness.   Physical assessment shows mild fat wasting to upper arm, mild muscle wasting to shoulder, clavicle and calf. R side of body was assessed but not the L side. Per chart review, pt has lost 17 lbs (9.8% body weight) in the past 1 month. This is significant for time frame.   Medications reviewed. Labs reviewed.    Diet Order:  Diet regular Room service appropriate? Yes; Fluid consistency: Thin  Skin:  Reviewed, no issues  Last BM:  9/22 (PTA)  Height:   Ht Readings from Last 1 Encounters:  01/16/17 5\' 10"  (1.778 m)    Weight:   Wt Readings from Last 1 Encounters:  01/16/17 156 lb 12 oz (71.1 kg)    Ideal Body Weight:  68.18 kg  BMI:  Body mass index is 22.49 kg/m.  Estimated Nutritional Needs:   Kcal:  5400-8676 (30-33 kcal/kg)  Protein:  107-120 grams (1.5-1.7 grams/kg)  Fluid:  >/= 2.1 L/day  EDUCATION NEEDS:   No education needs identified at this time  Jarome Matin, MS, RD, LDN, [facility] In[facility] Clinical Dietitian Pager # [phone] After hours/weekend pager # [phone]

## 2017-01-16 NOTE — Evaluation (Signed)
Occupational Therapy Evaluation Patient Details Name: Kelly Stuart MRN: [id number] DOB: [birth year] Today's Date: 01/16/2017    History of Present Illness 71 y.o. 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 She was started on Keppra and Decadron and discharged home. Patient being planned to start systemic chemotherapy in October by her oncologist. She had radiation to the brain done on 9/10. Also completed a course of Decadron 2 weeks back.    Clinical Impression   Pt admitted with LUE and LLE weakness. Pt currently with functional limitations due to the deficits listed below (see OT Problem List).  Pt will benefit from skilled OT to increase their safety and independence with ADL and functional mobility for ADL to facilitate discharge to venue listed below.      Follow Up Recommendations  CIR    Equipment Recommendations  None recommended by OT       Precautions / Restrictions Precautions Precautions: Fall      Mobility Bed Mobility Overal bed mobility: Needs Assistance Bed Mobility: Rolling;Supine to Sit;Sit to Supine Rolling: Mod assist   Supine to sit: Mod assist Sit to supine: Max assist   General bed mobility comments: cues for sequencing. Pt is impulsive with decreased safety awareness.  Transfers Overall transfer level: Needs assistance Equipment used: None Transfers: Sit to/from Stand Sit to Stand: Mod assist         General transfer comment: did not perform    Balance Overall balance assessment: Needs assistance Sitting-balance support: Single extremity supported;Feet supported Sitting balance-Leahy Scale: Fair     Standing balance support: Single extremity supported;During functional activity Standing  balance-Leahy Scale: Zero                             ADL either performed or assessed with clinical judgement   ADL Overall ADL's : Needs assistance/impaired Eating/Feeding: Minimal assistance;Bed level;Moderate assistance Eating/Feeding Details (indicate cue type and reason): VC to find food on plate/tray Grooming: Moderate assistance;Bed level Grooming Details (indicate cue type and reason): Vc to find needed items                                     Vision Patient Visual Report: Other (comment) Vision Assessment?: Yes Ocular Range of Motion: Restricted on the left Tracking/Visual Pursuits: Other (comment);Requires cues, head turns, or add eye shifts to track;Left eye does not track laterally Additional Comments: Pt not able to track past midline laterally. Pt able to use head turn to find item.  Encouraged pts sister to speak to her on her left             Pertinent Vitals/Pain Pain Assessment: No/denies pain     Hand Dominance Right   Extremity/Trunk Assessment Upper Extremity Assessment Upper Extremity Assessment: LUE deficits/detail LUE Deficits / Details: trace movement noted with elbow extension and flexion.  When performing movements pt compensates with other muscle groups.   Lower Extremity Assessment Lower Extremity Assessment: LLE deficits/detail LLE Deficits / Details: increased flexor tone, no active movement noted, foot positioned in inversion and supination. Sensation appears intact LLE Coordination: decreased gross motor   Cervical / Trunk Assessment Cervical / Trunk  Assessment: Normal   Communication Communication Communication: No difficulties   Cognition Arousal/Alertness: Awake/alert Behavior During Therapy: WFL for tasks assessed/performed Overall Cognitive Status: Impaired/Different from baseline Area of Impairment: Memory;Safety/judgement;Problem solving                     Memory: Decreased short-term  memory   Safety/Judgement: Decreased awareness of safety   Problem Solving: Difficulty sequencing;Requires verbal cues     General Comments  L side inattention            Home Living Family/patient expects to be discharged to:: Private residence Living Arrangements: Other relatives (sister) Available Help at Discharge: Family;Available 24 hours/day Type of Home: House Home Access: Stairs to enter [employer] of Steps: 2   Home Layout: Able to live on main level with bedroom/bathroom     Bathroom Shower/Tub: Walk-in shower;Door   [employer] Toilet: Standard     Home Equipment: Environmental consultant - 2 wheels;Wheelchair - [employer];Shower seat - built in          Prior Functioning/Environment Level of Independence: Independent        Comments: independent living alone prior to 2 weeks ago        OT Problem List: Decreased strength;Decreased range of motion;Decreased activity tolerance;Decreased knowledge of use of DME or AE;Impaired UE functional use;Impaired balance (sitting and/or standing);Decreased safety awareness;Decreased coordination      OT Treatment/Interventions: Self-care/ADL training;Patient/family education;DME and/or AE instruction    OT Goals(Current goals can be found in the care plan section) Acute Rehab OT Goals Patient Stated Goal: home OT Goal Formulation: With patient Time For Goal Achievement: 01/30/17 Potential to Achieve Goals: Fair  OT Frequency: Min 3X/week   Barriers to D/C:               AM-PAC PT "6 Clicks" Daily Activity     Outcome Measure Help from another person eating meals?: A Lot Help from another person taking care of personal grooming?: A Lot Help from another person toileting, which includes using toliet, bedpan, or urinal?: Total Help from another person bathing (including washing, rinsing, drying)?: Total Help from another person to put on and taking off regular upper body clothing?: A Lot Help from  another person to put on and taking off regular lower body clothing?: Total 6 Click Score: 9   End of Session Nurse Communication: Mobility status  Activity Tolerance: Patient tolerated treatment well Patient left: in bed;with call bell/phone within reach;with bed alarm set;with family/visitor present  OT Visit Diagnosis: Unsteadiness on feet (R26.81);Muscle weakness (generalized) (M62.81);Other symptoms and signs involving the nervous system (R29.898)                Time: 5681-2751 OT Time Calculation (min): 43 min Charges:  OT General Charges $OT Visit: 1 Visit OT Evaluation $OT Eval Moderate Complexity: 1 Mod OT Treatments $Self Care/Home Management : 23-37 mins G-Codes:     Kari Baars, OT [phone]  Payton Mccallum D 01/16/2017, 2:09 PM

## 2017-01-16 NOTE — Evaluation (Signed)
Physical Therapy Evaluation Patient Details Name: Kelly Stuart MRN: [id number] DOB: 28-Apr-1945 Today's Date: 01/16/2017   History of Present Illness  71 y.o. 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 She was started on Keppra and Decadron and discharged home. Patient being planned to start systemic chemotherapy in October by her oncologist. She had radiation to the brain done on 9/10. Also completed a course of Decadron 2 weeks back.   Clinical Impression  Pt admitted with above diagnosis. Pt currently with functional limitations due to the deficits listed below (see PT Problem List). On eval, pt required mod to max assist bed mobility and mod assist sit to stand. Would recommend +2 assist for stand-pivot transfers. No active movement noted LUE/LE. Sensation appears to be intact on L side. Pt does present with L side inattention. See below for further eval details. Pt will benefit from skilled PT to increase their independence and safety with mobility to allow discharge to the venue listed below.  Pt is very motivated to participate in therapy. She also has good family support.     Follow Up Recommendations CIR;Supervision/Assistance - 24 hour    Equipment Recommendations  None recommended by PT    Recommendations for Other Services Rehab consult     Precautions / Restrictions Precautions Precautions: Fall      Mobility  Bed Mobility Overal bed mobility: Needs Assistance Bed Mobility: Rolling;Supine to Sit;Sit to Supine Rolling: Mod assist   Supine to sit: Mod assist Sit to supine: Max assist   General bed mobility comments: cues for sequencing. Pt is impulsive with decreased safety awareness.  Transfers Overall transfer level:  Needs assistance Equipment used: None Transfers: Sit to/from Stand Sit to Stand: Mod assist         General transfer comment: Pt retropulsive with initial stance, bracing RLE against bed. Cues to shift weight into R forefoot. Pt able to stand statically x 1 minute with max assist.  Ambulation/Gait             General Gait Details: unable  Stairs            Wheelchair Mobility    Modified Rankin (Stroke Patients Only)       Balance Overall balance assessment: Needs assistance Sitting-balance support: Single extremity supported;Feet supported Sitting balance-Leahy Scale: Fair     Standing balance support: Single extremity supported;During functional activity Standing balance-Leahy Scale: Zero                               Pertinent Vitals/Pain Pain Assessment: No/denies pain    Home Living Family/patient expects to be discharged to:: Private residence Living Arrangements: Other relatives (sister) Available Help at Discharge: Family;Available 24 hours/day Type of Home: House Home Access: Stairs to enter   [employer] of Steps: 2 Home Layout: Able to live on main level with bedroom/bathroom Home Equipment: Walker - 2 wheels;Wheelchair - [employer];Shower seat - built in      Prior Function Level of Independence: Independent         Comments: independent living alone prior to 2 weeks ago     Hand Dominance   Dominant Hand: Right    Extremity/Trunk Assessment   Upper Extremity Assessment Upper  Extremity Assessment: Defer to OT evaluation    Lower Extremity Assessment Lower Extremity Assessment: LLE deficits/detail LLE Deficits / Details: increased flexor tone, no active movement noted, foot positioned in inversion and supination. Sensation appears intact LLE Coordination: decreased gross motor    Cervical / Trunk Assessment Cervical / Trunk Assessment: Normal  Communication   Communication: No  difficulties  Cognition Arousal/Alertness: Awake/alert Behavior During Therapy: WFL for tasks assessed/performed Overall Cognitive Status: Impaired/Different from baseline Area of Impairment: Memory;Safety/judgement;Problem solving                     Memory: Decreased short-term memory   Safety/Judgement: Decreased awareness of safety   Problem Solving: Difficulty sequencing;Requires verbal cues        General Comments General comments (skin integrity, edema, etc.): L side inattention    Exercises     Assessment/Plan    PT Assessment Patient needs continued PT services  PT Problem List Decreased strength;Decreased mobility;Decreased activity tolerance;Decreased balance;Decreased knowledge of use of DME;Decreased cognition;Decreased safety awareness;Decreased knowledge of precautions       PT Treatment Interventions DME instruction;Gait training;Functional mobility training;Neuromuscular re-education;Balance training;Wheelchair mobility training;Patient/family education;Therapeutic exercise;Therapeutic activities;Cognitive remediation    PT Goals (Current goals can be found in the Care Plan section)  Acute Rehab PT Goals Patient Stated Goal: home PT Goal Formulation: With patient/family Time For Goal Achievement: 01/30/17 Potential to Achieve Goals: Good    Frequency Min 4X/week   Barriers to discharge        Co-evaluation               AM-PAC PT "6 Clicks" Daily Activity  Outcome Measure Difficulty turning over in bed (including adjusting bedclothes, sheets and blankets)?: Unable Difficulty moving from lying on back to sitting on the side of the bed? : Unable Difficulty sitting down on and standing up from a chair with arms (e.g., wheelchair, bedside commode, etc,.)?: Unable Help needed moving to and from a bed to chair (including a wheelchair)?: A Lot Help needed walking in hospital room?: Total Help needed climbing 3-5 steps with a railing? :  Total 6 Click Score: 7    End of Session Equipment Utilized During Treatment: Gait belt Activity Tolerance: Patient tolerated treatment well Patient left: in bed;with bed alarm set;with call bell/phone within reach;with nursing/sitter in room Nurse Communication: Mobility status PT Visit Diagnosis: Other abnormalities of gait and mobility (R26.89);Other symptoms and signs involving the nervous system (R29.898)    Time: 2585-2778 PT Time Calculation (min) (ACUTE ONLY): 30 min   Charges:   PT Evaluation $PT Eval Moderate Complexity: 1 Mod PT Treatments $Therapeutic Activity: 8-22 mins   PT G Codes:        Lorrin Goodell, PT  Office # [phone] Pager [phone]   Kelly Stuart 01/16/2017, 11:14 AM

## 2017-01-17 DIAGNOSIS — E440 Moderate protein-calorie malnutrition: Secondary | ICD-10-CM | POA: Insufficient documentation

## 2017-01-17 LAB — BASIC METABOLIC PANEL
Anion gap: 7 (ref 5–15)
BUN: 11 mg/dL (ref 6–20)
CO2: 23 mmol/L (ref 22–32)
CREATININE: 0.49 mg/dL (ref 0.44–1.00)
Calcium: 8.9 mg/dL (ref 8.9–10.3)
Chloride: 105 mmol/L (ref 101–111)
GFR calc Af Amer: >60 mL/min (ref >60)
Glucose, Bld: 153 mg/dL — ABNORMAL HIGH (ref 65–99)
Potassium: 3.9 mmol/L (ref 3.5–5.1)
SODIUM: 135 mmol/L (ref 135–145)

## 2017-01-17 LAB — GLUCOSE, CAPILLARY: GLUCOSE-CAPILLARY: 148 mg/dL — AB (ref 65–99)

## 2017-01-17 LAB — CBC
HCT: 34.3 % — ABNORMAL LOW (ref 36.0–46.0)
Hemoglobin: 11.5 g/dL — ABNORMAL LOW (ref 12.0–15.0)
MCH: 29.3 pg (ref 26.0–34.0)
MCHC: 33.5 g/dL (ref 30.0–36.0)
MCV: 87.5 fL (ref 78.0–100.0)
Platelets: 176 10*3/uL (ref 150–400)
RBC: 3.92 MIL/uL (ref 3.87–5.11)
RDW: 14.8 % (ref 11.5–15.5)
WBC: 7.9 10*3/uL (ref 4.0–10.5)

## 2017-01-17 MED ORDER — PANTOPRAZOLE SODIUM 40 MG PO TBEC
40.0000 mg | DELAYED_RELEASE_TABLET | Freq: Two times a day (BID) | ORAL | Status: DC
  Administered 2017-01-17 – 2017-01-18 (×2): 40 mg via ORAL
  Filled 2017-01-17 (×2): qty 1

## 2017-01-17 MED ORDER — DEXAMETHASONE 4 MG PO TABS
4.0000 mg | ORAL_TABLET | Freq: Two times a day (BID) | ORAL | Status: DC
  Administered 2017-01-17 – 2017-01-18 (×3): 4 mg via ORAL
  Filled 2017-01-17 (×4): qty 1

## 2017-01-17 NOTE — Progress Notes (Signed)
PROGRESS NOTE    Kelly Stuart  PZW:[id number] DOB: 1945/05/23 DOA: 01/15/2017 PCP: Jonathon Jordan, MD  Brief Narrative: Sonnie Bias  is a 71 y.o. female prior h/o breast CA in 2013 s/p chemo  followed by lumpectomy.. 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 She was started on Keppra and Decadron and discharged home. Patient being planned to start systemic chemotherapy from October by oncology. She had radiation to the brain done on 9/10 and completed a course of Decadron 2 weeks back. Patient started having weakness of her left side on 9/21 symptoms worsened and over the past 2 days he was progressively unable to move both her left arm and legs. Ct with hemorrhage into mets, restarted decadron and IV keppra, NeuroOnc following  Assessment & Plan:   Left sided weakness -due to increased hemorrhage in the R fronto parietal lobe metastatic lesion following recent XRT -stopped NSAIDs -appreciate consult per Dr.Vaslow -Onc Dr.Gudena notified -restarted on decadron 4mg  BID and IV keppra, now change to PO -expect some clinical improvement as blood gets re-absorbed -PT following/ very motivated for Rehab, would be excellent CIR candidate if possible  Cancer of left breast / lung metastatic to brain [facility]) -as above -was supposed to follow-up with Dr.Gudina this week for chemotherapy -Status post SRS completed on 9/13  h/o Focal seizures ([place]) Continue seizure precautions, resumed IV Keppra twice a day. -change to PO  Essential hypertension Continue amlodipine.  DVT Prophylaxis : SCDs  Code Status: Full code Family Communication: Sister at bedside  Disposition Plan: CIR when bed available  Consultants:   Dr. Mickeal Skinner  Dr. Sonny Dandy   Procedures:   Antimicrobials:    Subjective: In better spirits this am, no new symptoms, working with PT, no notable change  from yesterday  Objective: Vitals:   01/16/17 0430 01/16/17 1255 01/16/17 2157 01/17/17 0430  BP: 110/88 103/68 (!) 107/58 109/65  Pulse: 65 82 65 71  Resp: 18 18 20 18   Temp: 98.3 F (36.8 C) 98.1 F (36.7 C) 97.8 F (36.6 C) 98 F (36.7 C)  TempSrc: Oral Oral Oral Oral  SpO2: 98% 97% 96% 95%  Weight: 71.1 kg (156 lb 12 oz)     Height: 5\' 10"  (1.778 m)      No intake or output data in the 24 hours ending 01/17/17 1327 Filed Weights   01/16/17 0430  Weight: 71.1 kg (156 lb 12 oz)    Examination:  Gen: Awake, Alert, Oriented X 3, no distress HEENT: PERRLA, Neck supple, no JVD Lungs: Good air movement bilaterally, CTAB CVS: RRR,No Gallops,Rubs or new Murmurs Abd: soft, Non tender, non distended, BS present Extremities: No Cyanosis, Clubbing or edema Skin: no new rashes Neuro: LUE and LLE flaccid and L hemineglect noted   Data Reviewed:   CBC:  Recent Labs Lab 01/15/17 1521 01/17/17 0423  WBC 4.8 7.9  NEUTROABS 3.5  --   HGB 13.2 11.5*  HCT 40.4 34.3*  MCV 88.8 87.5  PLT 184 423   Basic Metabolic Panel:  Recent Labs Lab 01/15/17 1521 01/17/17 0423  NA 135 135  K 4.2 3.9  CL 101 105  CO2 26 23  GLUCOSE 118* 153*  BUN 8 11  CREATININE 0.51 0.49  CALCIUM 9.2 8.9   GFR: Estimated Creatinine Clearance: 68.7 mL/min (by C-G formula based on SCr of 0.49 mg/dL). Liver Function Tests:  Recent Labs Lab  01/15/17 1521  AST 45*  ALT 51  ALKPHOS 119  BILITOT 1.0  PROT 7.6  ALBUMIN 3.6   No results for input(s): LIPASE, AMYLASE in the last 168 hours. No results for input(s): AMMONIA in the last 168 hours. Coagulation Profile: No results for input(s): INR, PROTIME in the last 168 hours. Cardiac Enzymes: No results for input(s): CKTOTAL, CKMB, CKMBINDEX, TROPONINI in the last 168 hours. BNP (last 3 results) No results for input(s): PROBNP in the last 8760 hours. HbA1C: No results for input(s): HGBA1C in the last 72 hours. CBG:  Recent Labs Lab  01/17/17 0807  GLUCAP 148*   Lipid Profile: No results for input(s): CHOL, HDL, LDLCALC, TRIG, CHOLHDL, LDLDIRECT in the last 72 hours. Thyroid Function Tests: No results for input(s): TSH, T4TOTAL, FREET4, T3FREE, THYROIDAB in the last 72 hours. Anemia Panel: No results for input(s): VITAMINB12, FOLATE, FERRITIN, TIBC, IRON, RETICCTPCT in the last 72 hours. Urine analysis:    Component Value Date/Time   BILIRUBINUR n 09/11/2014 0903   PROTEINUR n 09/11/2014 0903   UROBILINOGEN negative 09/11/2014 0903   NITRITE n 09/11/2014 0903   LEUKOCYTESUR Negative 09/11/2014 0903   Sepsis Labs: @LABRCNTIP (procalcitonin:4,lacticidven:4)  )No results found for this or any previous visit (from the past 240 hour(s)).       Radiology Studies: Ct Head Wo Contrast  Result Date: 01/15/2017 CLINICAL DATA:  Metastatic  cancer.  Increased left-sided weakness EXAM: CT HEAD WITHOUT CONTRAST TECHNIQUE: Contiguous axial images were obtained from the base of the skull through the vertex without intravenous contrast. COMPARISON:  CT head 12/30/2016, MRI 12/20/2016 and 12/31/2016. FINDINGS: Brain: Hemorrhagic metastatic deposit in the right frontal parietal lobe has increased in size. There has been further hemorrhage since the prior studies. The lesion now measures 4.5 x 4.3 cm with surrounding edema. Local mass-effect without shift of the midline structures. Enhancing metastatic deposit in the left parietal lobe is not identified on CT without contrast but is seen on the prior MRI. No acute infarct. No other areas of hemorrhage. Negative for hydrocephalus or shift of the midline structures. Vascular: Negative for hyperdense vessel Skull: Negative Sinuses/Orbits: Negative Other: None IMPRESSION: 4.3 x 4.5 cm hemorrhagic metastatic deposit in the right frontal parietal lobe has increased in size since the prior studies. There has been further hemorrhage in the lesion. No shift of the midline structures. Additional  metastatic deposit left parietal lobe not identified on CT. These results were called by telephone at the time of interpretation on 01/15/2017 at 2:21 pm to Dr. Davonna Belling , who verbally acknowledged these results. Electronically Signed   By: Franchot Gallo M.D.   On: 01/15/2017 14:18        Scheduled Meds: . amLODipine  5 mg Oral Daily  . dexamethasone  4 mg Oral Q12H  . escitalopram  20 mg Oral Daily  . levETIRAcetam  1,000 mg Oral BID  . multivitamin with minerals  1 tablet Oral Daily  . pantoprazole  40 mg Oral BID  . protein supplement shake  11 oz Oral Q24H   Continuous Infusions:   LOS: 2 days    Time spent: 3min    Domenic Polite, MD [facility] Pager [phone]  If 7PM-7AM, please contact night-coverage [email or web address] Password [email or web address] 01/17/2017, 1:27 PM

## 2017-01-17 NOTE — Progress Notes (Signed)
Occupational Therapy Treatment Patient Details Name: Kelly Stuart MRN: [id number] DOB: [birth year] Today's Date: 01/17/2017    History of present illness 71 y.o. 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 She was started on Keppra and Decadron and discharged home. Patient being planned to start systemic chemotherapy in October by her oncologist. She had radiation to the brain done on 9/10. Also completed a course of Decadron 2 weeks back.    OT comments  OT session focused on looking left as well as sitting balance.  Did not get pt to chair as sisters were not in room. Did not feel safe. Will do this next OT session   Follow Up Recommendations  CIR -called and spoke to rehab admissions. Order already placed. Pt has GREAT support system  Equipment Recommendations  None recommended by OT       Precautions / Restrictions Precautions Precautions: Fall Precaution Comments: pt is a pusher as well as decreased attention to the left       Mobility Bed Mobility Overal bed mobility: Needs Assistance Bed Mobility: Rolling;Supine to Sit;Sit to Supine Rolling: Min assist   Supine to sit: Mod assist Sit to supine: Mod assist   General bed mobility comments: pt   Transfers Overall transfer level: Needs assistance   Transfers: Sit to/from Stand Sit to Stand: Max assist         General transfer comment: Upon attempted standing LLE demonstated extension pattern. did not perform transfer this session - only sit to stand.      Balance Overall balance assessment: Needs assistance Sitting-balance support: Single extremity supported;Feet supported;Bilateral upper extremity supported (LUe in supported position but only tone noted) Sitting  balance-Leahy Scale: Poor Sitting balance - Comments: balance flunctuated during OT session .  Pt tends to move quickly but did well with VC and encouragement. Pt did maintain sitting balance for 15 min with min a and VC Postural control: Posterior lean;Left lateral lean   Standing balance-Leahy Scale: Zero                             ADL either performed or assessed with clinical judgement   ADL Overall ADL's : Needs assistance/impaired Eating/Feeding: Moderate assistance;Sitting;Cueing for sequencing;Cueing for safety;Cueing for compensatory techinques Eating/Feeding Details (indicate cue type and reason): VC  to find needed items on left and focus on sitting balance Grooming: Moderate assistance;Cueing for safety;Sitting;Cueing for compensatory techniques;Cueing for sequencing                                       Vision Patient Visual Report: Other (comment) Vision Assessment?: Yes Ocular Range of Motion: Restricted on the left Tracking/Visual Pursuits: Other (comment);Requires cues, head turns, or add eye shifts to track;Left eye does not track laterally Additional Comments: OT session focused on attending to left side in sitting. Pt with L inattention as well as pt is not able to track to left past midline. Pt needs max VC for gives good effort .  Pt does not an awareness of her iinattention and attempts to self correct with VC          Cognition Arousal/Alertness: Awake/alert  Behavior During Therapy: WFL for tasks assessed/performed Overall Cognitive Status: Impaired/Different from baseline                               Problem Solving: Difficulty sequencing;Requires verbal cues General Comments: L inattention              General Comments  Upon sitting OT did note 1 fingers subluxation in which OT continued to provide education to pt regarding importance of supported positioning and to not let any one pull on it during transfers  or bed mobility.   Will reinterate with sisters as well as pt each OT session.             Frequency  Min 3X/week        Progress Toward Goals  OT Goals(current goals can now be found in the care plan section)  Progress towards OT goals: Progressing toward goals  Acute Rehab OT Goals Patient Stated Goal: home OT Goal Formulation: With patient Time For Goal Achievement: 01/30/17 Potential to Achieve Goals: Fair  Plan      Co-evaluation          would be a GREAT idea to cotx this patient to address pushing, extension patterns as well as L inattention        AM-PAC PT "6 Clicks" Daily Activity     Outcome Measure   Help from another person eating meals?: A Lot Help from another person taking care of personal grooming?: A Lot Help from another person toileting, which includes using toliet, bedpan, or urinal?: Total Help from another person bathing (including washing, rinsing, drying)?: Total Help from another person to put on and taking off regular upper body clothing?: A Lot Help from another person to put on and taking off regular lower body clothing?: Total 6 Click Score: 9    End of Session Equipment Utilized During Treatment: Gait belt  OT Visit Diagnosis: Unsteadiness on feet (R26.81);Muscle weakness (generalized) (M62.81);Other symptoms and signs involving the nervous system (R29.898)   Activity Tolerance Patient tolerated treatment well   Patient Left in bed;with call bell/phone within reach;with bed alarm set;with family/visitor present   Nurse Communication Mobility status        Time: 2979-8921 OT Time Calculation (min): 45 min  Charges: OT General Charges $OT Visit: 1 Visit OT Treatments $Therapeutic Activity: 38-52 mins  [place], [place]   Betsy Pries 01/17/2017, 1:59 PM

## 2017-01-17 NOTE — Progress Notes (Signed)
Rehab admissions - Noted order for rehab consult.  I spoke with OT and patient seems to be a good candidate.  I will see patient likely tomorrow and see if I can get her into inpatient rehab if she is agreeable.  Call me for questions.  #[phone]

## 2017-01-18 ENCOUNTER — Encounter: Payer: Self-pay | Admitting: Radiation Oncology

## 2017-01-18 ENCOUNTER — Encounter (HOSPITAL_COMMUNITY): Payer: Self-pay | Admitting: *Deleted

## 2017-01-18 ENCOUNTER — Ambulatory Visit: Payer: Medicare Other | Admitting: Physical Therapy

## 2017-01-18 ENCOUNTER — Inpatient Hospital Stay (HOSPITAL_COMMUNITY)
Admission: RE | Admit: 2017-01-18 | Discharge: 2017-01-28 | DRG: 057 | Disposition: A | Discharge status: Other Acute Inpatient Hospital | Payer: Medicare Other | Source: Other Acute Inpatient Hospital | Attending: Physical Medicine & Rehabilitation | Admitting: Physical Medicine & Rehabilitation

## 2017-01-18 DIAGNOSIS — C7971 Secondary malignant neoplasm of right adrenal gland: Secondary | ICD-10-CM | POA: Diagnosis present

## 2017-01-18 DIAGNOSIS — I69154 Hemiplegia and hemiparesis following nontraumatic intracerebral hemorrhage affecting left non-dominant side: Principal | ICD-10-CM

## 2017-01-18 DIAGNOSIS — M6289 Other specified disorders of muscle: Secondary | ICD-10-CM | POA: Diagnosis not present

## 2017-01-18 DIAGNOSIS — C7989 Secondary malignant neoplasm of other specified sites: Secondary | ICD-10-CM | POA: Diagnosis present

## 2017-01-18 DIAGNOSIS — I82449 Acute embolism and thrombosis of unspecified tibial vein: Secondary | ICD-10-CM | POA: Diagnosis present

## 2017-01-18 DIAGNOSIS — T732XXD Exhaustion due to exposure, subsequent encounter: Secondary | ICD-10-CM

## 2017-01-18 DIAGNOSIS — R270 Ataxia, unspecified: Secondary | ICD-10-CM | POA: Diagnosis not present

## 2017-01-18 DIAGNOSIS — M8588 Other specified disorders of bone density and structure, other site: Secondary | ICD-10-CM | POA: Diagnosis not present

## 2017-01-18 DIAGNOSIS — I619 Nontraumatic intracerebral hemorrhage, unspecified: Secondary | ICD-10-CM | POA: Diagnosis present

## 2017-01-18 DIAGNOSIS — K5900 Constipation, unspecified: Secondary | ICD-10-CM | POA: Diagnosis present

## 2017-01-18 DIAGNOSIS — G8194 Hemiplegia, unspecified affecting left nondominant side: Secondary | ICD-10-CM | POA: Diagnosis present

## 2017-01-18 DIAGNOSIS — R531 Weakness: Secondary | ICD-10-CM | POA: Diagnosis present

## 2017-01-18 DIAGNOSIS — I10 Essential (primary) hypertension: Secondary | ICD-10-CM | POA: Diagnosis present

## 2017-01-18 DIAGNOSIS — Z882 Allergy status to sulfonamides status: Secondary | ICD-10-CM

## 2017-01-18 DIAGNOSIS — C7972 Secondary malignant neoplasm of left adrenal gland: Secondary | ICD-10-CM | POA: Diagnosis present

## 2017-01-18 DIAGNOSIS — C7951 Secondary malignant neoplasm of bone: Secondary | ICD-10-CM | POA: Diagnosis present

## 2017-01-18 DIAGNOSIS — R5383 Other fatigue: Secondary | ICD-10-CM

## 2017-01-18 DIAGNOSIS — S43002A Unspecified subluxation of left shoulder joint, initial encounter: Secondary | ICD-10-CM

## 2017-01-18 DIAGNOSIS — I611 Nontraumatic intracerebral hemorrhage in hemisphere, cortical: Secondary | ICD-10-CM | POA: Diagnosis not present

## 2017-01-18 DIAGNOSIS — Z881 Allergy status to other antibiotic agents status: Secondary | ICD-10-CM | POA: Diagnosis not present

## 2017-01-18 DIAGNOSIS — Z79899 Other long term (current) drug therapy: Secondary | ICD-10-CM | POA: Diagnosis not present

## 2017-01-18 DIAGNOSIS — C3431 Malignant neoplasm of lower lobe, right bronchus or lung: Secondary | ICD-10-CM | POA: Diagnosis present

## 2017-01-18 DIAGNOSIS — R609 Edema, unspecified: Secondary | ICD-10-CM | POA: Diagnosis not present

## 2017-01-18 DIAGNOSIS — Z885 Allergy status to narcotic agent status: Secondary | ICD-10-CM | POA: Diagnosis not present

## 2017-01-18 DIAGNOSIS — Z87891 Personal history of nicotine dependence: Secondary | ICD-10-CM | POA: Diagnosis not present

## 2017-01-18 DIAGNOSIS — D696 Thrombocytopenia, unspecified: Secondary | ICD-10-CM | POA: Diagnosis present

## 2017-01-18 DIAGNOSIS — K219 Gastro-esophageal reflux disease without esophagitis: Secondary | ICD-10-CM | POA: Diagnosis present

## 2017-01-18 DIAGNOSIS — Z8739 Personal history of other diseases of the musculoskeletal system and connective tissue: Secondary | ICD-10-CM | POA: Diagnosis not present

## 2017-01-18 DIAGNOSIS — F419 Anxiety disorder, unspecified: Secondary | ICD-10-CM | POA: Diagnosis present

## 2017-01-18 DIAGNOSIS — C7931 Secondary malignant neoplasm of brain: Secondary | ICD-10-CM | POA: Diagnosis present

## 2017-01-18 DIAGNOSIS — R569 Unspecified convulsions: Secondary | ICD-10-CM | POA: Diagnosis present

## 2017-01-18 DIAGNOSIS — Z923 Personal history of irradiation: Secondary | ICD-10-CM

## 2017-01-18 DIAGNOSIS — I82409 Acute embolism and thrombosis of unspecified deep veins of unspecified lower extremity: Secondary | ICD-10-CM | POA: Insufficient documentation

## 2017-01-18 DIAGNOSIS — C3490 Malignant neoplasm of unspecified part of unspecified bronchus or lung: Secondary | ICD-10-CM | POA: Diagnosis present

## 2017-01-18 DIAGNOSIS — G8190 Hemiplegia, unspecified affecting unspecified side: Secondary | ICD-10-CM | POA: Diagnosis not present

## 2017-01-18 DIAGNOSIS — C7801 Secondary malignant neoplasm of right lung: Secondary | ICD-10-CM | POA: Diagnosis present

## 2017-01-18 DIAGNOSIS — I82439 Acute embolism and thrombosis of unspecified popliteal vein: Secondary | ICD-10-CM | POA: Diagnosis present

## 2017-01-18 DIAGNOSIS — I8290 Acute embolism and thrombosis of unspecified vein: Secondary | ICD-10-CM | POA: Diagnosis not present

## 2017-01-18 DIAGNOSIS — R414 Neurologic neglect syndrome: Secondary | ICD-10-CM | POA: Diagnosis not present

## 2017-01-18 DIAGNOSIS — I82412 Acute embolism and thrombosis of left femoral vein: Secondary | ICD-10-CM | POA: Diagnosis not present

## 2017-01-18 DIAGNOSIS — I82402 Acute embolism and thrombosis of unspecified deep veins of left lower extremity: Secondary | ICD-10-CM | POA: Diagnosis not present

## 2017-01-18 DIAGNOSIS — T380X5D Adverse effect of glucocorticoids and synthetic analogues, subsequent encounter: Secondary | ICD-10-CM | POA: Diagnosis not present

## 2017-01-18 DIAGNOSIS — C50912 Malignant neoplasm of unspecified site of left female breast: Secondary | ICD-10-CM

## 2017-01-18 DIAGNOSIS — I2692 Saddle embolus of pulmonary artery without acute cor pulmonale: Secondary | ICD-10-CM | POA: Diagnosis present

## 2017-01-18 DIAGNOSIS — Z853 Personal history of malignant neoplasm of breast: Secondary | ICD-10-CM

## 2017-01-18 DIAGNOSIS — I610 Nontraumatic intracerebral hemorrhage in hemisphere, subcortical: Secondary | ICD-10-CM | POA: Diagnosis not present

## 2017-01-18 DIAGNOSIS — I824Z9 Acute embolism and thrombosis of unspecified deep veins of unspecified distal lower extremity: Secondary | ICD-10-CM | POA: Diagnosis not present

## 2017-01-18 LAB — GLUCOSE, CAPILLARY: GLUCOSE-CAPILLARY: 138 mg/dL — AB (ref 65–99)

## 2017-01-18 MED ORDER — DEXAMETHASONE 4 MG PO TABS
4.0000 mg | ORAL_TABLET | Freq: Two times a day (BID) | ORAL | Status: DC
  Administered 2017-01-18: 4 mg via ORAL
  Filled 2017-01-18: qty 1

## 2017-01-18 MED ORDER — LORAZEPAM 0.5 MG PO TABS
0.5000 mg | ORAL_TABLET | Freq: Every evening | ORAL | Status: DC | PRN
  Administered 2017-01-21 – 2017-01-27 (×6): 0.5 mg via ORAL
  Filled 2017-01-18 (×7): qty 1

## 2017-01-18 MED ORDER — PANTOPRAZOLE SODIUM 40 MG PO TBEC
40.0000 mg | DELAYED_RELEASE_TABLET | Freq: Two times a day (BID) | ORAL | Status: DC
  Administered 2017-01-18 – 2017-01-26 (×16): 40 mg via ORAL
  Filled 2017-01-18 (×16): qty 1

## 2017-01-18 MED ORDER — ONDANSETRON HCL 4 MG/2ML IJ SOLN: 4.0000 mg | Freq: Four times a day (QID) | INTRAMUSCULAR | Status: DC | PRN

## 2017-01-18 MED ORDER — PROCHLORPERAZINE EDISYLATE 5 MG/ML IJ SOLN: 5.0000 mg | Freq: Four times a day (QID) | INTRAMUSCULAR | Status: DC | PRN

## 2017-01-18 MED ORDER — TRAZODONE HCL 50 MG PO TABS
25.0000 mg | ORAL_TABLET | Freq: Every evening | ORAL | Status: DC | PRN
  Administered 2017-01-18 – 2017-01-25 (×8): 50 mg via ORAL
  Filled 2017-01-18 (×8): qty 1

## 2017-01-18 MED ORDER — GUAIFENESIN-DM 100-10 MG/5ML PO SYRP: 5.0000 mL | ORAL_SOLUTION | Freq: Four times a day (QID) | ORAL | Status: DC | PRN

## 2017-01-18 MED ORDER — ESCITALOPRAM OXALATE 10 MG PO TABS
20.0000 mg | ORAL_TABLET | Freq: Every day | ORAL | Status: DC
  Administered 2017-01-19 – 2017-01-28 (×10): 20 mg via ORAL
  Filled 2017-01-18 (×10): qty 2

## 2017-01-18 MED ORDER — LEVETIRACETAM 1000 MG PO TABS: 1000.0000 mg | ORAL_TABLET | Freq: Two times a day (BID) | ORAL | 0 refills | Status: AC

## 2017-01-18 MED ORDER — POLYETHYLENE GLYCOL 3350 17 G PO PACK
17.0000 g | PACK | Freq: Two times a day (BID) | ORAL | Status: DC
  Administered 2017-01-18 – 2017-01-23 (×9): 17 g via ORAL
  Filled 2017-01-18 (×11): qty 1

## 2017-01-18 MED ORDER — ACETAMINOPHEN 325 MG PO TABS: 650.0000 mg | ORAL_TABLET | Freq: Four times a day (QID) | ORAL | 0 refills | Status: AC | PRN

## 2017-01-18 MED ORDER — POLYETHYLENE GLYCOL 3350 17 G PO PACK
17.0000 g | PACK | Freq: Once | ORAL | Status: AC
  Administered 2017-01-18: 17 g via ORAL

## 2017-01-18 MED ORDER — ONDANSETRON HCL 4 MG PO TABS: 4.0000 mg | ORAL_TABLET | Freq: Four times a day (QID) | ORAL | Status: DC | PRN

## 2017-01-18 MED ORDER — PANTOPRAZOLE SODIUM 40 MG PO TBEC: 40.0000 mg | DELAYED_RELEASE_TABLET | Freq: Two times a day (BID) | ORAL | 0 refills | Status: AC

## 2017-01-18 MED ORDER — AMLODIPINE BESYLATE 5 MG PO TABS
5.0000 mg | ORAL_TABLET | Freq: Every day | ORAL | Status: DC
  Administered 2017-01-19 – 2017-01-22 (×4): 5 mg via ORAL
  Filled 2017-01-18 (×5): qty 1

## 2017-01-18 MED ORDER — LEVETIRACETAM 500 MG PO TABS
1000.0000 mg | ORAL_TABLET | Freq: Two times a day (BID) | ORAL | Status: DC
  Administered 2017-01-18 – 2017-01-28 (×20): 1000 mg via ORAL
  Filled 2017-01-18 (×20): qty 2

## 2017-01-18 MED ORDER — ADULT MULTIVITAMIN W/MINERALS CH
1.0000 | ORAL_TABLET | Freq: Every day | ORAL | Status: DC
Start: 2017-01-19 — End: 2017-01-28
  Administered 2017-01-19 – 2017-01-28 (×10): 1 via ORAL
  Filled 2017-01-18 (×10): qty 1

## 2017-01-18 MED ORDER — PREMIER PROTEIN SHAKE: 11.0000 [oz_av] | ORAL | 0 refills | Status: DC

## 2017-01-18 MED ORDER — BISACODYL 10 MG RE SUPP
10.0000 mg | Freq: Every day | RECTAL | Status: DC | PRN
  Filled 2017-01-18: qty 1

## 2017-01-18 MED ORDER — PREMIER PROTEIN SHAKE
11.0000 [oz_av] | ORAL | Status: DC
  Filled 2017-01-18 (×2): qty 325.31

## 2017-01-18 MED ORDER — DEXAMETHASONE 4 MG PO TABS: 4.0000 mg | ORAL_TABLET | Freq: Two times a day (BID) | ORAL | 0 refills | Status: AC

## 2017-01-18 MED ORDER — ACETAMINOPHEN 325 MG PO TABS
325.0000 mg | ORAL_TABLET | ORAL | Status: DC | PRN
  Filled 2017-01-18: qty 2

## 2017-01-18 MED ORDER — ALUM & MAG HYDROXIDE-SIMETH 200-200-20 MG/5ML PO SUSP: 30.0000 mL | ORAL | Status: DC | PRN

## 2017-01-18 MED ORDER — PROCHLORPERAZINE MALEATE 5 MG PO TABS: 5.0000 mg | ORAL_TABLET | Freq: Four times a day (QID) | ORAL | Status: DC | PRN

## 2017-01-18 MED ORDER — PROCHLORPERAZINE 25 MG RE SUPP: 12.5000 mg | Freq: Four times a day (QID) | RECTAL | Status: DC | PRN

## 2017-01-18 MED ORDER — FLEET ENEMA 7-19 GM/118ML RE ENEM: 1.0000 | ENEMA | Freq: Once | RECTAL | Status: DC | PRN

## 2017-01-18 MED ORDER — DIPHENHYDRAMINE HCL 12.5 MG/5ML PO ELIX: 12.5000 mg | ORAL_SOLUTION | Freq: Four times a day (QID) | ORAL | Status: DC | PRN

## 2017-01-18 NOTE — Discharge Instructions (Signed)
Dexamethasone tablets What is this medicine? DEXAMETHASONE (dex a METH a sone) is a corticosteroid. It is commonly used to treat inflammation of the skin, joints, lungs, and other organs. Common conditions treated include asthma, allergies, and arthritis. It is also used for other conditions, such as blood disorders and diseases of the adrenal glands. This medicine may be used for other purposes; ask your health care provider or pharmacist if you have questions. COMMON BRAND NAME(S): CUSHINGS SYNDROME DIAGNOSTIC, Decadron, DexPak Jr TaperPak, DexPak TaperPak, Zema-Pak, ZoDex, ZonaCort 11 Day, ZonaCort 7 Day What should I tell my health care provider before I take this medicine? They need to know if you have any of these conditions: -Cushing's syndrome -diabetes -glaucoma -heart problems or disease -high blood pressure -infection like herpes, measles, tuberculosis, or chickenpox -kidney disease -liver disease -mental problems -myasthenia gravis -osteoporosis -previous heart attack -seizures -stomach, ulcer or intestine disease including colitis and diverticulitis -thyroid problem -an unusual or allergic reaction to dexamethasone, corticosteroids, other medicines, lactose, foods, dyes, or preservatives -pregnant or trying to get pregnant -breast-feeding How should I use this medicine? Take this medicine by mouth with a drink of water. Follow the directions on the prescription label. Take it with food or milk to avoid stomach upset. If you are taking this medicine once a day, take it in the morning. Do not take more medicine than you are told to take. Do not suddenly stop taking your medicine because you may develop a severe reaction. Your doctor will tell you how much medicine to take. If your doctor wants you to stop the medicine, the dose may be slowly lowered over time to avoid any side effects. Talk to your pediatrician regarding the use of this medicine in children. Special care may be  needed. Patients over 81 years old may have a stronger reaction and need a smaller dose. Overdosage: If you think you have taken too much of this medicine contact a poison control center or emergency room at once. NOTE: This medicine is only for you. Do not share this medicine with others. What if I miss a dose? If you miss a dose, take it as soon as you can. If it is almost time for your next dose, talk to your doctor or health care professional. You may need to miss a dose or take an extra dose. Do not take double or extra doses without advice. What may interact with this medicine? Do not take this medicine with any of the following medications: -mifepristone, RU-486 -vaccines This medicine may also interact with the following medications: -amphotericin B -antibiotics like clarithromycin, erythromycin, and troleandomycin -aspirin and aspirin-like drugs -barbiturates like phenobarbital -carbamazepine -cholestyramine -cholinesterase inhibitors like donepezil, galantamine, rivastigmine, and tacrine -cyclosporine -digoxin -diuretics -ephedrine -female hormones, like estrogens or progestins and birth control pills -indinavir -isoniazid -ketoconazole -medicines for diabetes -medicines that improve muscle tone or strength for conditions like myasthenia gravis -NSAIDs, medicines for pain and inflammation, like ibuprofen or naproxen -phenytoin -rifampin -thalidomide -warfarin This list may not describe all possible interactions. Give your health care provider a list of all the medicines, herbs, non-prescription drugs, or dietary supplements you use. Also tell them if you smoke, drink alcohol, or use illegal drugs. Some items may interact with your medicine. What should I watch for while using this medicine? Visit your doctor or health care professional for regular checks on your progress. If you are taking this medicine over a prolonged period, carry an identification card with your name  and address, the  type and dose of your medicine, and your doctor's name and address. This medicine may increase your risk of getting an infection. Stay away from people who are sick. Tell your doctor or health care professional if you are around anyone with measles or chickenpox. If you are going to have surgery, tell your doctor or health care professional that you have taken this medicine within the last twelve months. Ask your doctor or health care professional about your diet. You may need to lower the amount of salt you eat. The medicine can increase your blood sugar. If you are a diabetic check with your doctor if you need help adjusting the dose of your diabetic medicine. What side effects may I notice from receiving this medicine? Side effects that you should report to your doctor or health care professional as soon as possible: -allergic reactions like skin rash, itching or hives, swelling of the face, lips, or tongue -changes in vision -fever, sore throat, sneezing, cough, or other signs of infection, wounds that will not heal -increased thirst -mental depression, mood swings, mistaken feelings of self importance or of being mistreated -pain in hips, back, ribs, arms, shoulders, or legs -redness, blistering, peeling or loosening of the skin, including inside the mouth -trouble passing urine or change in the amount of urine -swelling of feet or lower legs -unusual bleeding or bruising Side effects that usually do not require medical attention (report to your doctor or health care professional if they continue or are bothersome): -headache -nausea, vomiting -skin problems, acne, thin and shiny skin -weight gain This list may not describe all possible side effects. Call your doctor for medical advice about side effects. You may report side effects to FDA at 1-800-FDA-1088. Where should I keep my medicine? Keep out of the reach of children. Store at room temperature between 20 and 25  degrees C (68 and 77 degrees F). Protect from light. Throw away any unused medicine after the expiration date. NOTE: This sheet is a summary. It may not cover all possible information. If you have questions about this medicine, talk to your doctor, pharmacist, or health care provider.  2018 Elsevier/Gold Standard (2007-08-02 14:02:13)

## 2017-01-18 NOTE — Progress Notes (Signed)
Occupational Therapy Treatment Patient Details Name: Kelly Stuart MRN: [id number] DOB: 1945/04/27 Today's Date: 01/18/2017    History of present illness 71 y.o. 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 She was started on Keppra and Decadron and discharged home. Patient being planned to start systemic chemotherapy in October by her oncologist. She had radiation to the brain done on 9/10. Also completed a course of Decadron 2 weeks back.    OT comments  Pt going to rehab this day! Pt able to get to a chair this session with PT and OT.  Upon sitting pt needed VC to look and attend to the left.  Encouraged sister to sit on pts left and have her attend to items on the left. Pt has just a good attitude and is a very Scientist, research (physical sciences).   Follow Up Recommendations  CIR    Equipment Recommendations  None recommended by OT       Precautions / Restrictions Precautions Precautions: Fall Precaution Comments: decreased attention to left Restrictions Weight Bearing Restrictions: No       Mobility Bed Mobility   Bed Mobility: Rolling;Sidelying to Sit Rolling: Mod assist   Supine to sit: Max assist     General bed mobility comments: pt rolled to left side which pt not able to push up with L side and needed increased A   Transfers Overall transfer level: Needs assistance   Transfers: Sit to/from Stand;Stand Pivot Transfers Sit to Stand: Max assist;+2 physical assistance;+2 safety/equipment Stand pivot transfers: Max assist;+2 physical assistance;+2 safety/equipment       General transfer comment: pt able to transfer bed to chair with 2 person A transferring to R side.  Upon standing did note pts L ankle does roll and LLE extends    Balance  Overall balance assessment: Needs assistance Sitting-balance support: Single extremity supported;Feet supported;Bilateral upper extremity supported                                       ADL either performed or assessed with clinical judgement   ADL Overall ADL's : Needs assistance/impaired Eating/Feeding: Minimal assistance;Bed level (HOB raised) Eating/Feeding Details (indicate cue type and reason): focused on finding items on the tray and attending to left side. Pt is improving and was able to track past midline with the left eye this OT session!                                                   Cognition Arousal/Alertness: Awake/alert Behavior During Therapy: WFL for tasks assessed/performed Overall Cognitive Status: Impaired/Different from baseline Area of Impairment: Awareness                             Problem Solving: Difficulty sequencing;Requires verbal cues General Comments: L inattention                   Pertinent Vitals/ Pain       Pain Assessment: No/denies pain  Frequency  Min 3X/week        Progress Toward Goals  OT Goals(current goals can now be found in the care plan section)  Progress towards OT goals: Progressing toward goals  Acute Rehab OT Goals Patient Stated Goal: home OT Goal Formulation: With patient Time For Goal Achievement: 01/30/17 Potential to Achieve Goals: [place] Discharge plan remains appropriate    Co-evaluation    PT/OT/SLP Co-Evaluation/Treatment: Yes Reason for Co-Treatment: Complexity of the patient's impairments (multi-system involvement);For patient/therapist safety;To address functional/ADL transfers PT goals addressed during session: Mobility/safety with mobility OT goals addressed during session: ADL's and self-care         End of Session    OT Visit Diagnosis: Unsteadiness on feet (R26.81);Muscle weakness (generalized) (M62.81);Other symptoms and  signs involving the nervous system (R29.898);Hemiplegia and hemiparesis Hemiplegia - Right/Left: Left Hemiplegia - dominant/non-dominant: Non-Dominant Hemiplegia - caused by: Unspecified   Activity Tolerance Patient tolerated treatment well   Patient Left in bed;with call bell/phone within reach;with family/visitor present           Time: 1355-1430 OT Time Calculation (min): 35 min  Charges: OT General Charges $OT Visit: 1 Visit OT Treatments $Self Care/Home Management : 8-22 mins $Therapeutic Activity: 8-22 mins  [place], [place]   Betsy Pries 01/18/2017, 4:33 PM

## 2017-01-18 NOTE — Care Management Important Message (Signed)
Important Message  Patient Details  Name: HILARIA TITSWORTH MRN: [id number] Date of Birth: 11/02/1945   Medicare Important Message Given:  Yes    Kerin Salen 01/18/2017, 10:35 AMImportant Message  Patient Details  Name: MARTHANN ABSHIER MRN: [id number] Date of Birth: 04-Dec-1945   Medicare Important Message Given:  Yes    Kerin Salen 01/18/2017, 10:35 AM

## 2017-01-18 NOTE — Progress Notes (Signed)
Physical Therapy Treatment Patient Details Name: Kelly Stuart MRN: [id number] DOB: 28-Oct-1945 Today's Date: 01/18/2017    History of Present Illness 71 y.o. 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 She was started on Keppra and Decadron and discharged home. Patient being planned to start systemic chemotherapy in October by her oncologist. She had radiation to the brain done on 9/10. Also completed a course of Decadron 2 weeks back.     PT Comments    Pt very motivated and demonstrating increased activity tolerance and improvement in balance and inattention to L.   Follow Up Recommendations  CIR     Equipment Recommendations  None recommended by PT    Recommendations for Other Services Rehab consult     Precautions / Restrictions Precautions Precautions: Fall Precaution Comments: decreased attention to left Restrictions Weight Bearing Restrictions: No    Mobility  Bed Mobility Overal bed mobility: Needs Assistance Bed Mobility: Rolling;Sidelying to Sit Rolling: Mod assist   Supine to sit: Max assist     General bed mobility comments: pt rolled to left side which pt not able to push up with L side and needed increased A   Transfers Overall transfer level: Needs assistance Equipment used: None Transfers: Sit to/from [employer] Transfers Sit to Stand: +2 physical assistance;+2 safety/equipment;Mod assist Stand pivot transfers: +2 physical assistance;+2 safety/equipment;Mod assist       General transfer comment: pt able to transfer bed to chair with 2 person A transferring to R side.  Upon standing did note pts L ankle does roll and LLE extends  Ambulation/Gait             General Gait Details:  unable   Stairs            Wheelchair Mobility    Modified Rankin (Stroke Patients Only)       Balance Overall balance assessment: Needs assistance Sitting-balance support: Single extremity supported;Feet supported;Bilateral upper extremity supported Sitting balance-Leahy Scale: Poor     Standing balance support: Single extremity supported;During functional activity Standing balance-Leahy Scale: Poor                              Cognition Arousal/Alertness: Awake/alert Behavior During Therapy: WFL for tasks assessed/performed Overall Cognitive Status: Impaired/Different from baseline Area of Impairment: Awareness                     Memory: Decreased short-term memory   Safety/Judgement: Decreased awareness of safety   Problem Solving: Difficulty sequencing;Requires verbal cues General Comments: L inattention      Exercises      General Comments        Pertinent Vitals/Pain Pain Assessment: No/denies pain    Home Living                      Prior Function            PT Goals (current goals can now be found in the care plan section) Acute Rehab PT Goals Patient Stated Goal: home PT Goal Formulation: With patient/family Time For Goal Achievement: 01/30/17 Potential to Achieve Goals: Good    Frequency    Min 4X/week      PT Plan  Current plan remains appropriate    Co-evaluation PT/OT/SLP Co-Evaluation/Treatment: Yes Reason for Co-Treatment: Complexity of the patient's impairments (multi-system involvement);For patient/therapist safety PT goals addressed during session: Mobility/safety with mobility OT goals addressed during session: ADL's and self-care      AM-PAC PT "6 Clicks" Daily Activity  Outcome Measure  Difficulty turning over in bed (including adjusting bedclothes, sheets and blankets)?: Unable Difficulty moving from lying on back to sitting on the side of the bed? : Unable Difficulty sitting  down on and standing up from a chair with arms (e.g., wheelchair, bedside commode, etc,.)?: Unable Help needed moving to and from a bed to chair (including a wheelchair)?: A Lot Help needed walking in hospital room?: Total Help needed climbing 3-5 steps with a railing? : Total 6 Click Score: 7    End of Session Equipment Utilized During Treatment: Gait belt Activity Tolerance: Patient tolerated treatment well Patient left: in chair;with call bell/phone within reach;with chair alarm set;with family/visitor present Nurse Communication: Mobility status PT Visit Diagnosis: Other abnormalities of gait and mobility (R26.89);Other symptoms and signs involving the nervous system (R29.898);Hemiplegia and hemiparesis Hemiplegia - Right/Left: Left     Time: 1354-1430 PT Time Calculation (min) (ACUTE ONLY): 36 min  Charges:  $Therapeutic Activity: 8-22 mins                    G Codes:       Pg [phone]    Trea Latner 01/18/2017, 5:31 PM

## 2017-01-18 NOTE — Progress Notes (Addendum)
Pt will transfer to CIR today.

## 2017-01-18 NOTE — Progress Notes (Signed)
   01/18/17 1400  Clinical Encounter Type  Visited With Patient and family together  Visit Type Initial  Referral From Physician  Consult/Referral To [place]   Following up on a request from the patients Physician.  Was welcomed in by patient and her sister.  Patient wanted to know abut my background and there shared about her faith journey.  She seemed comforted by sharing.  PT came in for her therapy time so I will follow up later today. Chaplain Katherene Ponto

## 2017-01-18 NOTE — PMR Pre-admission (Signed)
[facility] PMR Admission Coordinator Pre-Admission Assessment  Patient: Kelly Stuart is an 71 y.o., female MRN: [id number] DOB: 1946-01-10 Height: 5\' 10"  (177.8 cm) Weight: 71.1 kg (156 lb 12 oz)  Insurance Information HMO: No   PPO:       PCP:       IPA:       80/20:       OTHER:   PRIMARY:  Medicare a/B      Policy#: [id number]      Subscriber: Kelly Stuart CM Name:        Phone#:       Fax#:   Pre-Cert#:        Employer: Retired Benefits:  Phone #:       Name: Checked in Kelly Stuart. Date: A=05/26/10 and B-04/25/12     Deduct: $1340      Out of Pocket Max: none      Life Max: N/A CIR: 100%      SNF: 100 days Outpatient: 80%     Co-Pay: 20% Home Health: 100%      Co-Pay: none DME: 80%     Co-Pay: 20% Providers: patient's choice  SECONDARY: CSI      Policy#: [id number]      Subscriber: Kelly Stuart CM Name:        Phone#:       Fax#:   Pre-Cert#:        Employer: Retired  Benefits:  Phone #:  [phone]     Name:   Eff. 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 She was started on Keppra and Decadron and discharged home. Patient being planned to start systemic chemotherapy in October by oncology. She had radiation to the brain done on 9/10 and completed a course of Decadron 2 weeks back. Patient  started having weakness of her left side on 9/21 symptoms worsened and over the past 2 days he was progressively unable to move both her left arm and legs. CT scan showed 4.3 x 4.5 cm hemorrhagic metastatic deposit in the right frontal parietal lobe has increased in size since the prior studies. There has been further hemorrhage in the lesion. No shift of the midline structures , restarted decadron and IV keppra, NeuroOnc following.  PT/OT evaluations with recommendations for inpatient rehab admission.  Patient to be admitted today for comprehensive inpatient rehab program.  Patient's medical record from  [facility] has been reviewed by the rehabilitation admission coordinator and physician.  Past Medical History  Past Medical History:  Diagnosis Date  .  Anxiety   . Brain cancer ([place])   . Breast cancer ([place])    ER+PR+ HER-2NEU negative  . Lung cancer ([place])     Family History   family history includes Cancer in her father and mother; Colon cancer in her father, mother, and paternal aunt; Hepatitis in her father; Hiatal hernia in her father; Rheumatic fever in her sister.  Prior Rehab/Hospitalizations Has the patient had major surgery during 100 days prior to admission? 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  Independent  Indoor Mobility: Did the patient need assistance with  walking from room to room (with or without device)? Independent  Stairs: Did the patient need assistance with internal or external stairs (with or without device)? Independent  Functional Cognition: Did the patient need help planning regular tasks such as shopping or remembering to take medications? Independent  Home Assistive Devices / Equipment Home Assistive Devices/Equipment: Eyeglasses Home Equipment: Environmental consultant - 2 wheels, Wheelchair - manual, Bedside commode, Shower seat - built in  Prior Device Use: Indicate devices/aids used by the patient prior to current illness, exacerbation or injury? None, sister says she uses a gait belt if needed.   Prior Functional Level Current Functional Level  Bed Mobility  Independent  Max assist   Transfers  Independent  Mod assist   Mobility - Walk/Wheelchair  Independent  Max assist (Stood for 1 minute max assist.)   Upper Body Dressing  Independent  Mod to max assist  Lower Body Dressing  Independent  Mod to max assist  Grooming  Independent  Mod assist   Eating/Drinking  Independent  Mod assist   Toilet Transfer  Independent  Mod to max assist   Bladder Continence   WDL  Using bedpan   Bowel Management  WDL  Last BM 01/13/17   Stair Climbing  Independent Other (Not tried)   Counsellor, intact  Verbal, intact   Memory  Intact  Intact   Cooking/Meal Prep  Independent      Housework  Independent    Money Management  Independent    Driving  Independent     Special needs/care consideration BiPAP/CPAP No CPM No Continuous Drip IV No Dialysis No        Life Vest No Oxygen No Special Bed No Trach Size no Wound Vac (area) No      Skin No                           Bowel mgmt: Last BM 01/13/17 Bladder mgmt: Using bedpan.  Was using BSC at home. Diabetic mgmt No  Previous Home Environment Living Arrangements: Other relatives (sister) Available Help at Discharge: Family, Available 24  hours/day Type of Home: House Home Layout: Able to live on main level with bedroom/bathroom Home Access: Stairs to enter Entrance Stairs-Number of Steps: 2 Bathroom Shower/Tub: Gaffer, Charity fundraiser: [place]  Services: No  Discharge Living Setting Plans for Discharge Living Setting: Lives with (comment), Other (Comment) (Plans home with sister to townhouse.) Type of Home at Discharge: Other (Comment) (Townhouse) Discharge Home Layout: Two level, Able to live on main level with bedroom/bathroom Alternate Level Stairs-Number of Steps: Flight Discharge Home Access: Stairs to enter Entrance Stairs-Number of Steps: 1 and 1/2 step entry Does the patient have any problems obtaining your medications?: No  Social/Family/Support Systems Patient Roles: Parent, Other (Comment) (Has 2 sisters and a daughter.) Contact Information: Luciana Axe - sister - [phone] Anticipated Caregiver: sister - Judson Roch Ability/Limitations of Caregiver: Has 2 sisters who live close by and can assist. Caregiver Availability: 24/7 Discharge Plan Discussed with Primary Caregiver: Yes Is Caregiver In Agreement with Plan?: Yes Does Caregiver/Family have Issues with Lodging/Transportation while Pt is in Rehab?: No  Goals/Additional Needs Patient/Family Goal for Rehab: PT/OT supervision to min assist goals Expected length of stay: 10-14 days Cultural Considerations: None Dietary Needs: Regular diet, thin liquids Equipment Needs: TBD Special Service Needs: Will need oncology followup and may need chemo q 3 weeks Pt/Family Agrees to Admission and willing to participate: Yes Program Orientation Provided & Reviewed with Pt/Caregiver Including Roles  & Responsibilities: Yes  Patient Condition: I spoke patient and her sister by phone.  Patient was independent a week ago and family hopeful to get patient back home as independent as possible.  Patient is participating with PT/OT and will benefit  from 3 hours of therapy a day.  Patient can tolerate increased therapies.  I have reviewed and discussed all information with rehab MD and have approval for acute inpatient rehab admission for today.  Preadmission Screen Completed By:  Retta Diones, 01/18/2017 2:38 PM ______________________________________________________________________   Discussed status with Dr. Posey Pronto on 01/18/17 at 1428 and received telephone approval for admission today.  Admission Coordinator:  Retta Diones, time 3:30PM/Date 01/18/17   Assessment/Plan: Diagnosis: Debility  1. Does the need for close, 24 hr/day  Medical supervision in concert with the patient's rehab needs make it unreasonable for this patient to be served in a less intensive setting? Yes  2. Co-Morbidities requiring supervision/potential complications: breast CA in 2013 s/p chemofollowed by lumpectomy with mets, anxiety, ABLA 3. Due to safety, disease management, pain management and patient education, does the patient require 24 hr/day rehab nursing? Yes 4. Does the patient require coordinated care of a physician, rehab nurse, PT (1-2 hrs/day, 5 days/week) and OT (1-2 hrs/day, 5 days/week) to address physical and functional deficits in the context of the above medical diagnosis(es)? Yes Addressing deficits in the following areas: balance, endurance, locomotion, strength, transferring, bathing, dressing, toileting and psychosocial support 5. Can the patient actively participate in an intensive therapy program of at least 3 hrs of therapy 5 days a week? Yes 6. The potential for patient to make measurable gains while on inpatient rehab is excellent 7. Anticipated functional outcomes upon discharge from inpatients are: min assist PT, min assist OT, n/a SLP 8. Estimated rehab length of stay to reach the above functional goals is: 14-17 days. 9. Does the patient have adequate social supports to accommodate these discharge functional goals?  Yes 10. Anticipated D/C setting: Home 11. Anticipated post D/C treatments: HH therapy and Home excercise program 12. Overall Rehab/Functional Prognosis: good and fair    RECOMMENDATIONS: This patient's condition is appropriate for continued rehabilitative care in the following setting: CIR Patient has agreed to participate in recommended program. Yes Note that insurance prior authorization may be  required for reimbursement for recommended care.  Delice Lesch, MD, ABPMR Jodell Cipro Jerilynn Mages 01/18/2017

## 2017-01-18 NOTE — Progress Notes (Signed)
Rehab admissions - I spoke with patient and her sister by phone today.  They are interested in inpatient rehab admission.  Patient has been cleared for discharge today.  Bed available and will admit to acute inpatient rehab today.  Call me for questions.  #[phone]

## 2017-01-18 NOTE — Progress Notes (Signed)
treport given to Genene Churn, Therapist, sports at [employer]. Pt will be transported via [facility].

## 2017-01-18 NOTE — Discharge Summary (Signed)
Physician Discharge Summary  Kelly Stuart LMB:[id number] DOB: May 29, 1945 DOA: 01/15/2017  PCP: Jonathon Jordan, MD  Admit date: 01/15/2017 Discharge date: 01/18/2017  Recommendations for Outpatient Follow-up:  1. Continue decadron and keppra 2. 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 She was started on Keppra and Decadron and discharged home. Patient being planned to start systemic chemotherapy from October by oncology. She had radiation to the brain done on 9/10 and completed a course of Decadron 2 weeks back. Patient started having weakness of her left side on 9/21 symptoms worsened and over the past 2 days he was progressively unable to move both her left arm and legs. Ct with hemorrhage into mets, restarted decadron and IV keppra, NeuroOnc following."  Hospital Course:   Left sided weakness - Due to increased hemorrhage in the right fronto parietal lobe secondary to metastatic lesions following recent XRT - Resume decadron - CIR placement if possible  Cancer of left breast / lung metastatic to brain [facility]) - Status post SRS completed on 9/13 - Follow up with oncology outpt  Focal seizures ([place]) - Continue Keppra 1000 mg BID and decadron  Essential hypertension - Continue Norvasc   DVT  ProphylaxisSCD's Code Status: Full code Family Communication: No family at the bedside    Consultants:   Dr. Mickeal Skinner  Dr. Sonny Dandy  Signed:  Leisa Lenz, MD  [facility] 01/18/2017, 11:42 AM  Pager #: [phone]  Time spent in minutes: more than 30 minutes    Discharge Exam: Vitals:   01/18/17 0619 01/18/17 0800  BP: (!) 114/58 115/70  Pulse: 62 63  Resp: 18 16  Temp: 98 F (36.7 C) 98.6 F (37 C)  SpO2:  95%   Vitals:   01/17/17 1548 01/17/17 2130 01/18/17 0619 01/18/17 0800  BP: (!) 103/51 119/68 (!) 114/58 115/70  Pulse:  63 62 63  Resp:  20 18 16   Temp:  (!) 97.4 F (36.3 C) 98 F (36.7 C) 98.6 F (37 C)  TempSrc:  Oral Oral Oral  SpO2:  97%  95%  Weight:      Height:        General: Pt is alert, follows commands appropriately, not in acute distress Cardiovascular: Regular rate and rhythm, S1/S2 +, no murmurs Respiratory: Clear to auscultation bilaterally, no wheezing, no crackles, no rhonchi Abdominal: Soft, non tender, non distended, bowel sounds +, no guarding Extremities: no edema, no cyanosis, pulses palpable bilaterally DP and PT Neuro: Grossly nonfocal  Discharge Instructions  Discharge Instructions    Call MD for:  persistant nausea and vomiting    Complete by:  As directed    Call MD for:  redness, tenderness, or signs of infection (pain, swelling, redness, odor or green/yellow discharge around incision site)    Complete by:  As directed  Call MD for:  severe uncontrolled pain    Complete by:  As directed    Diet - low sodium heart healthy    Complete by:  As directed    Increase activity slowly    Complete by:  As directed      Allergies as of 01/18/2017      Reactions   Codeine Nausea And Vomiting   Penicillins Rash   Has patient had a PCN reaction causing immediate rash, facial/tongue/throat swelling, SOB or lightheadedness with hypotension: Yes Has patient had a PCN reaction causing severe rash involving mucus  membranes or skin necrosis: No Has patient had a PCN reaction that required hospitalization: Yes Has patient had a PCN reaction occurring within the last 10 years: No If all of the above answers are "NO", then may proceed with Cephalosporin use.   Sulfa Antibiotics Rash   Tetracyclines & Related Rash   Patient states Acromycin      Medication List    STOP taking these medications   naproxen sodium 220 MG tablet Commonly known as:  ANAPROX     TAKE these medications   acetaminophen 325 MG tablet Commonly known as:  TYLENOL Take 2 tablets (650 mg total) by mouth every 6 (six) hours as needed for mild pain (or Fever >/= 101).   amLODipine 5 MG tablet Commonly known as:  NORVASC Take 5 mg by mouth daily.   dexamethasone 4 MG tablet Commonly known as:  DECADRON Take 1 tablet (4 mg total) by mouth every 12 (twelve) hours. What changed:  medication strength  when to take this  additional instructions   escitalopram 20 MG tablet Commonly known as:  LEXAPRO Take 20 mg by mouth daily.   ibuprofen 200 MG tablet Commonly known as:  ADVIL,MOTRIN Take 200 mg by mouth 2 (two) times daily as needed for headache or moderate pain.   levETIRAcetam 1000 MG tablet Commonly known as:  KEPPRA Take 1 tablet (1,000 mg total) by mouth 2 (two) times daily. What changed:  medication strength   pantoprazole 40 MG tablet Commonly known as:  PROTONIX Take 1 tablet (40 mg total) by mouth 2 (two) times daily.   protein supplement shake Liqd Commonly known as:  PREMIER PROTEIN Take 325 mLs (11 oz total) by mouth daily.            Discharge Care Instructions        Start     Ordered   01/18/17 0000  acetaminophen (TYLENOL) 325 MG tablet  Every 6 hours PRN     01/18/17 1141   01/18/17 0000  dexamethasone (DECADRON) 4 MG tablet  Every 12 hours     01/18/17 1141   01/18/17 0000  levETIRAcetam (KEPPRA) 1000 MG tablet  2 times daily     01/18/17 1141   01/18/17 0000  pantoprazole  (PROTONIX) 40 MG tablet  2 times daily     01/18/17 1141   01/18/17 0000  protein supplement shake (PREMIER PROTEIN) LIQD  Every 24 hours     01/18/17 1141   01/18/17 0000  Increase activity slowly     01/18/17 1141   01/18/17 0000  Diet - low sodium heart healthy     01/18/17 1141   01/18/17 0000  Call MD for:  persistant nausea and vomiting     01/18/17 1141   01/18/17 0000  Call MD for:  severe uncontrolled pain     01/18/17 1141   01/18/17 0000  Call MD for:  redness, tenderness, or signs of infection (pain, swelling, redness, odor or green/yellow discharge around incision site)     01/18/17 1141     Follow-up Information    Jonathon Jordan, MD. Schedule an appointment as soon as possible for a visit.   Specialty:  Family Medicine Contact information: [address] [place] [address] [phone]            The results of significant diagnostics from this hospitalization (including imaging, microbiology, ancillary and laboratory) are listed below for reference.    Significant Diagnostic Studies: Ct Head Wo Contrast  Result Date: 01/15/2017 CLINICAL DATA:  Metastatic  cancer.  Increased left-sided weakness EXAM: CT HEAD WITHOUT CONTRAST TECHNIQUE: Contiguous axial images were obtained from the base of the skull through the vertex without intravenous contrast. COMPARISON:  CT head 12/30/2016, MRI 12/20/2016 and 12/31/2016. FINDINGS: Brain: Hemorrhagic metastatic deposit in the right frontal parietal lobe has increased in size. There has been further hemorrhage since the prior studies. The lesion now measures 4.5 x 4.3 cm with surrounding edema. Local mass-effect without shift of the midline structures. Enhancing metastatic deposit in the left parietal lobe is not identified on CT without contrast but is seen on the prior MRI. No acute infarct. No other areas of hemorrhage. Negative for hydrocephalus or shift of the midline structures. Vascular: Negative for  hyperdense vessel Skull: Negative Sinuses/Orbits: Negative Other: None IMPRESSION: 4.3 x 4.5 cm hemorrhagic metastatic deposit in the right frontal parietal lobe has increased in size since the prior studies. There has been further hemorrhage in the lesion. No shift of the midline structures. Additional metastatic deposit left parietal lobe not identified on CT. These results were called by telephone at the time of interpretation on 01/15/2017 at 2:21 pm to Dr. Davonna Belling , who verbally acknowledged these results. Electronically Signed   By: Franchot Gallo M.D.   On: 01/15/2017 14:18   Ct Head Wo Contrast  Result Date: 12/30/2016 CLINICAL DATA:  Patient had radiation therapy to the brain today and this evening noticed abnormal gait. Loss of cord nasion in the left leg. History of seizures and lung cancer with brain metastasis. EXAM: CT HEAD WITHOUT CONTRAST TECHNIQUE: Contiguous axial images were obtained from the base of the skull through the vertex without intravenous contrast. COMPARISON:  MRI brain 12/20/2016 FINDINGS: Brain: Mass lesion in the right posterior frontal lobe with central necrosis measuring 3.6 cm diameter. Mild surrounding vasogenic edema. 1.1 cm diameter mass lesion along the left medial parietal cortical gyrus. Both of these lesions can be seen on the previous MRI examination. Additional tiny lesion in the left parietal cortex is not identified at CT. There is mild effacement of right frontal sulci caused by the mass lesion but without change since the MRI. No midline shift. No abnormal extra-axial fluid collections. Gray-white matter junctions are distinct. Basal cisterns are not effaced. Ventricles are not dilated or effaced. No acute intracranial hemorrhage. Vascular: No hyperdense vessel or unexpected calcification. Skull: Normal. Negative for fracture or focal lesion. Sinuses/Orbits: No acute finding. Other: None. IMPRESSION: Known metastatic lesions identified in the right  posterior frontal low measuring 3.6 cm diameter and in the left medial parietal lobe measuring 1.1 cm diameter. Mild vasogenic edema. No increase in mass effect and no midline shift. No acute intracranial hemorrhage. Electronically Signed   By: Lucienne Capers M.D.   On: 12/30/2016 22:21   Mr Jeri Cos TM Contrast  Result Date: 12/31/2016 CLINICAL DATA:  Ataxia. Rule out  stroke. History of metastatic disease EXAM: MRI HEAD WITHOUT AND WITH CONTRAST MRI CERVICAL SPINE WITHOUT AND WITH CONTRAST TECHNIQUE: Multiplanar, multiecho pulse sequences of the brain and surrounding structures, and cervical spine, to include the craniocervical junction and cervicothoracic junction, were obtained without and with intravenous contrast. CONTRAST:  15 mL MultiHance IV COMPARISON:  MRI head 12/20/2016 FINDINGS: MRI HEAD FINDINGS Brain: Image quality degraded by motion Large enhancing metastatic deposit in the right parietal lobe shows interval growth now measuring 37 x 38 mm, compared with 30 x 34 mm previously. Mild surrounding edema unchanged. Moderate intra tumor hemorrhage unchanged. Hemorrhagic metastatic deposit left medial parietal lobe mildly larger now measuring 12 mm compared with 10.5 x 8.3 mm previously. Mild intra tumor hemorrhage unchanged. 4 mm left parietal cortical metastatic disease unchanged. Negative for hydrocephalus or midline shift. Mild mass-effect on the right ventricle due to the right parietal tumor. No acute ischemic infarction. Vascular: Normal arterial flow void Skull and upper cervical spine: Negative Sinuses/Orbits: Negative Other: None MRI CERVICAL SPINE FINDINGS Image quality degraded by significant motion. Alignment: Normal alignment.  Mild kyphosis Vertebrae: Negative for fracture or metastatic disease in the cervical spine Cord: Normal cord signal.  No enhancing lesion in the cord Posterior Fossa, vertebral arteries, paraspinal tissues: Negative Disc levels: Disc degeneration and spondylosis at  C4-5, C5-6, and C6-7 causing foraminal narrowing bilaterally. No cord compression. IMPRESSION: Progression of metastatic disease in the parietal lobe bilaterally. Intra tumor hemorrhage is present as noted previously. No new metastatic disease in the brain. No acute infarct Negative for cervical spine metastatic disease.  No cord compression Image quality degraded by motion. Electronically Signed   By: Franchot Gallo M.D.   On: 12/31/2016 10:53   Mr Jeri Cos HC Contrast  Result Date: 12/20/2016 CLINICAL DATA:  Secondary malignant neoplasm of the brain and spinal cord. Hemorrhagic metastasis secondary to lung cancer. SRS planning. EXAM: MRI HEAD WITHOUT AND WITH CONTRAST TECHNIQUE: Multiplanar, multiecho pulse sequences of the brain and surrounding structures were obtained without and with intravenous contrast. CONTRAST:  65mL MULTIHANCE GADOBENATE DIMEGLUMINE 529 MG/ML IV SOLN COMPARISON:  MRI brain 12/03/2016 at [facility]. FINDINGS: Brain: A hemorrhagic lesion in the posterior right frontal lobe sixth has increased in size since the prior study. This likely reflects interval hemorrhage there is peripheral enhancement of this lesion which now measures 3.4 x 3.0 x 3.4 cm. A lesion in the medial left parietal lobe is also slightly larger than on the prior exam, now measuring 1.0 x 0.8 x 1.0 cm. Minimal hemorrhage is associated with the left parietal lesion. A 4 mm left parietal lesion is not well seen due to patient motion on the prior exam. There is significant surrounding vasogenic edema with each of these lesions. Other scattered periventricular and subcortical T2 changes bilaterally likely reflect the sequela of chronic microvascular ischemia. No acute infarct is present. The brainstem and cerebellum are within normal limits. Vascular: Flow is present in the major intracranial arteries. Skull and upper cervical spine: The skullbase is within normal limits. The craniocervical junction is normal.  Degenerative changes are present the upper cervical spine. No definite osseous lesions are evident. Sinuses/Orbits: The paranasal sinuses and mastoid air cells are clear. The globes and orbits are unremarkable. IMPRESSION: 1. Interval increase in size of posterior right frontal lobe and medial left parietal lesions previously described. This likely reflects interval hemorrhage of the right frontal lesion and possibly in the left parietal lesion. 2. Additional 4 mm lesion noted in  the left parietal lobe on image 112 of series 12. Electronically Signed   By: San Morelle M.D.   On: 12/20/2016 16:25   Mr Cervical Spine W Wo Contrast  Result Date: 12/31/2016 CLINICAL DATA:  Ataxia. Rule out stroke. History of metastatic disease EXAM: MRI HEAD WITHOUT AND WITH CONTRAST MRI CERVICAL SPINE WITHOUT AND WITH CONTRAST TECHNIQUE: Multiplanar, multiecho pulse sequences of the brain and surrounding structures, and cervical spine, to include the craniocervical junction and cervicothoracic junction, were obtained without and with intravenous contrast. CONTRAST:  15 mL MultiHance IV COMPARISON:  MRI head 12/20/2016 FINDINGS: MRI HEAD FINDINGS Brain: Image quality degraded by motion Large enhancing metastatic deposit in the right parietal lobe shows interval growth now measuring 37 x 38 mm, compared with 30 x 34 mm previously. Mild surrounding edema unchanged. Moderate intra tumor hemorrhage unchanged. Hemorrhagic metastatic deposit left medial parietal lobe mildly larger now measuring 12 mm compared with 10.5 x 8.3 mm previously. Mild intra tumor hemorrhage unchanged. 4 mm left parietal cortical metastatic disease unchanged. Negative for hydrocephalus or midline shift. Mild mass-effect on the right ventricle due to the right parietal tumor. No acute ischemic infarction. Vascular: Normal arterial flow void Skull and upper cervical spine: Negative Sinuses/Orbits: Negative Other: None MRI CERVICAL SPINE FINDINGS Image  quality degraded by significant motion. Alignment: Normal alignment.  Mild kyphosis Vertebrae: Negative for fracture or metastatic disease in the cervical spine Cord: Normal cord signal.  No enhancing lesion in the cord Posterior Fossa, vertebral arteries, paraspinal tissues: Negative Disc levels: Disc degeneration and spondylosis at C4-5, C5-6, and C6-7 causing foraminal narrowing bilaterally. No cord compression. IMPRESSION: Progression of metastatic disease in the parietal lobe bilaterally. Intra tumor hemorrhage is present as noted previously. No new metastatic disease in the brain. No acute infarct Negative for cervical spine metastatic disease.  No cord compression Image quality degraded by motion. Electronically Signed   By: Franchot Gallo M.D.   On: 12/31/2016 10:53   Mr Thoracic Spine W Wo Contrast  Result Date: 12/31/2016 CLINICAL DATA:  Metastatic disease.  Ataxia.  Pain EXAM: MRI THORACIC WITHOUT AND WITH CONTRAST TECHNIQUE: Multiplanar and multiecho pulse sequences of the thoracic spine were obtained without and with intravenous contrast. CONTRAST:  15 mL MultiHance IV COMPARISON:  CT chest 11/02/2016 FINDINGS: MRI THORACIC SPINE FINDINGS Image quality degraded by significant motion. Alignment:  Normal Vertebrae: Negative for vertebral body fracture. Bony metastatic disease is present however small lesions could be missed given the amount of motion. Metastatic disease in the left pedicles of T6 and T7 as well as in the left seventh rib with associated soft tissue mass. Metastatic disease in the posterior elements of T12 with mild posterior epidural tumor. Epidural tumor is causing mild spinal stenosis and displacement of the cord anteriorly. Tumor extends into the right pedicle and vertebral body of T12 Cord:  Negative for cord compression.  Cord signal normal. Paraspinal and other soft tissues: Bony metastatic disease as above. Right lower lobe lung mass as noted on prior CT. Disc levels: Negative  for disc degeneration or disc protrusion. IMPRESSION: Bony metastatic disease as above. 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 Negative for cord compression.  No pathologic fracture. Electronically Signed   By: Franchot Gallo M.D.   On: 12/31/2016 10:39    Microbiology: No results found for this or any previous visit (from the past 240 hour(s)).   Labs: Basic Metabolic Panel:  Recent Labs Lab 01/15/17 1521 01/17/17 0423  NA 135  135  K 4.2 3.9  CL 101 105  CO2 26 23  GLUCOSE 118* 153*  BUN 8 11  CREATININE 0.51 0.49  CALCIUM 9.2 8.9   Liver Function Tests:  Recent Labs Lab 01/15/17 1521  AST 45*  ALT 51  ALKPHOS 119  BILITOT 1.0  PROT 7.6  ALBUMIN 3.6   No results for input(s): LIPASE, AMYLASE in the last 168 hours. No results for input(s): AMMONIA in the last 168 hours. CBC:  Recent Labs Lab 01/15/17 1521 01/17/17 0423  WBC 4.8 7.9  NEUTROABS 3.5  --   HGB 13.2 11.5*  HCT 40.4 34.3*  MCV 88.8 87.5  PLT 184 176   Cardiac Enzymes: No results for input(s): CKTOTAL, CKMB, CKMBINDEX, TROPONINI in the last 168 hours. BNP: BNP (last 3 results) No results for input(s): BNP in the last 8760 hours.  ProBNP (last 3 results) No results for input(s): PROBNP in the last 8760 hours.  CBG:  Recent Labs Lab 01/17/17 0807 01/18/17 0758  GLUCAP 148* 138*

## 2017-01-18 NOTE — Progress Notes (Signed)
Occupational Therapy Treatment Patient Details Name: Kelly Stuart MRN: [id number] DOB: 27-Feb-1946 Today's Date: 01/18/2017    History of present illness 71 y.o. 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 She was started on Keppra and Decadron and discharged home. Patient being planned to start systemic chemotherapy in October by her oncologist. She had radiation to the brain done on 9/10. Also completed a course of Decadron 2 weeks back.    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 Pt is improving and was able to track past midline with the left eye this OT session!                                         Vision  noted pt able to track past mid line with left eye this OT session            Cognition Arousal/Alertness: Awake/alert Behavior During Therapy: [facility] for tasks assessed/performed Overall Cognitive Status: Impaired/Different from baseline Area of Impairment: Awareness                             Problem Solving: Difficulty sequencing;Requires verbal cues General Comments: L  inattention          Frequency  Min 3X/week        Progress Toward Goals  OT Goals(current goals can now be found in the care plan section)     Acute Rehab OT Goals Patient Stated Goal: home OT Goal Formulation: With patient Time For Goal Achievement: 01/30/17 Potential to Achieve Goals: [place] Discharge plan remains appropriate          End of Session    OT Visit Diagnosis: Unsteadiness on feet (R26.81);Muscle weakness (generalized) (M62.81);Other symptoms and signs involving the nervous system (R29.898)   Activity Tolerance Patient tolerated treatment well   Patient Left in bed;with call bell/phone within reach;with family/visitor present   Nurse Communication          Time: 7673-4193 OT Time Calculation (min): 10 min  Charges: OT General Charges $OT Visit: 1 Visit OT Treatments $Self Care/Home Management : 8-22 mins  [place], [place]   Payton Mccallum D 01/18/2017, 2:48 PM

## 2017-01-18 NOTE — Progress Notes (Signed)
Pt admitted to unit via [facility]. Pt alert and oriented. No pain or distress noted. Continue plan of care.

## 2017-01-19 ENCOUNTER — Inpatient Hospital Stay (HOSPITAL_COMMUNITY): Payer: Medicare Other | Admitting: Occupational Therapy

## 2017-01-19 ENCOUNTER — Encounter (HOSPITAL_COMMUNITY): Payer: Self-pay

## 2017-01-19 ENCOUNTER — Encounter (HOSPITAL_COMMUNITY): Payer: Medicare Other

## 2017-01-19 ENCOUNTER — Inpatient Hospital Stay (HOSPITAL_COMMUNITY): Payer: Medicare Other

## 2017-01-19 DIAGNOSIS — C7931 Secondary malignant neoplasm of brain: Secondary | ICD-10-CM

## 2017-01-19 DIAGNOSIS — R414 Neurologic neglect syndrome: Secondary | ICD-10-CM

## 2017-01-19 DIAGNOSIS — G8194 Hemiplegia, unspecified affecting left nondominant side: Secondary | ICD-10-CM

## 2017-01-19 LAB — CBC WITH DIFFERENTIAL/PLATELET
BASOS ABS: 0 10*3/uL (ref 0.0–0.1)
BASOS PCT: 0 %
EOS ABS: 0 10*3/uL (ref 0.0–0.7)
EOS PCT: 0 %
HCT: 33.9 % — ABNORMAL LOW (ref 36.0–46.0)
Hemoglobin: 10.8 g/dL — ABNORMAL LOW (ref 12.0–15.0)
LYMPHS PCT: 9 %
Lymphs Abs: 0.6 10*3/uL — ABNORMAL LOW (ref 0.7–4.0)
MCH: 28.1 pg (ref 26.0–34.0)
MCHC: 31.9 g/dL (ref 30.0–36.0)
MCV: 88.3 fL (ref 78.0–100.0)
MONO ABS: 0.5 10*3/uL (ref 0.1–1.0)
Monocytes Relative: 9 %
Neutro Abs: 5.2 10*3/uL (ref 1.7–7.7)
Neutrophils Relative %: 82 %
PLATELETS: 165 10*3/uL (ref 150–400)
RBC: 3.84 MIL/uL — ABNORMAL LOW (ref 3.87–5.11)
RDW: 15 % (ref 11.5–15.5)
WBC: 6.3 10*3/uL (ref 4.0–10.5)

## 2017-01-19 LAB — COMPREHENSIVE METABOLIC PANEL
ALBUMIN: 2.7 g/dL — AB (ref 3.5–5.0)
ALT: 23 U/L (ref 14–54)
AST: 21 U/L (ref 15–41)
Alkaline Phosphatase: 74 U/L (ref 38–126)
Anion gap: 8 (ref 5–15)
BUN: 14 mg/dL (ref 6–20)
CHLORIDE: 103 mmol/L (ref 101–111)
CO2: 23 mmol/L (ref 22–32)
CREATININE: 0.58 mg/dL (ref 0.44–1.00)
Calcium: 8.7 mg/dL — ABNORMAL LOW (ref 8.9–10.3)
GFR calc Af Amer: >60 mL/min (ref >60)
GFR calc non Af Amer: >60 mL/min (ref >60)
Glucose, Bld: 134 mg/dL — ABNORMAL HIGH (ref 65–99)
POTASSIUM: 3.9 mmol/L (ref 3.5–5.1)
SODIUM: 134 mmol/L — AB (ref 135–145)
Total Bilirubin: 0.7 mg/dL (ref 0.3–1.2)
Total Protein: 6.2 g/dL — ABNORMAL LOW (ref 6.5–8.1)

## 2017-01-19 LAB — GLUCOSE, CAPILLARY
GLUCOSE-CAPILLARY: 113 mg/dL — AB (ref 65–99)
Glucose-Capillary: 135 mg/dL — ABNORMAL HIGH (ref 65–99)
Glucose-Capillary: 188 mg/dL — ABNORMAL HIGH (ref 65–99)

## 2017-01-19 MED ORDER — BOOST / RESOURCE BREEZE PO LIQD
1.0000 | Freq: Three times a day (TID) | ORAL | Status: DC
  Administered 2017-01-19 – 2017-01-23 (×9): 1 via ORAL

## 2017-01-19 MED ORDER — DEXAMETHASONE 4 MG PO TABS
4.0000 mg | ORAL_TABLET | Freq: Every day | ORAL | Status: DC
  Administered 2017-01-19 – 2017-01-28 (×10): 4 mg via ORAL
  Filled 2017-01-19 (×11): qty 1

## 2017-01-19 NOTE — Progress Notes (Signed)
[name], MD Physician Signed Physical Medicine and Rehabilitation  PMR Pre-admission Date of Service: 01/18/2017 2:16 PM  Related encounter: ED to Hosp-Admission (Discharged) from 01/15/2017 in [place]       [] Hide copied text   [employer] PMR Admission Coordinator Pre-Admission Assessment  Patient: Kelly Stuart is an 71 y.o., female MRN: [id number] DOB: May 15, 1945 Height: 5\' 10"  (177.8 cm) Weight: 71.1 kg (156 lb 12 oz)  Insurance Information HMO: No   PPO:       PCP:       IPA:       80/20:       OTHER:   PRIMARY:  Medicare a/B      Policy#: [id number]      Subscriber: [name] CM Name:        Phone#:       Fax#:   Pre-Cert#:        Employer: Retired Benefits:  Phone #:       Name: Checked in [place]. Date: A=05/26/10 and B-04/25/12     Deduct: $1340      Out of Pocket Max: none      Life Max: N/A CIR: 100%      SNF: 100 days Outpatient: 80%     Co-Pay: 20% Home Health: 100%      Co-Pay: none DME: 80%     Co-Pay: 20% Providers: patient's choice  SECONDARY: CSI      Policy#: [id number]      Subscriber: [name] CM Name:        Phone#:       Fax#:   Pre-Cert#:        Employer: Retired  Benefits:  Phone #:  [phone]     Name:   Eff. 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  In 01/2016 she was diagnosed withmetastatic lung cancer to the brain and spine, was  hospitalized 2 weeks back for focal seizures and generalized weakness. She was started on Keppra and Decadron and discharged home. Patient being planned to start systemic chemotherapy in October by oncology. She had radiation to the brain done on 9/10 and completed a course of Decadron 2 weeks back. Patient started having weakness of her left side on 9/21 symptoms worsened and over the past 2 days he was progressively unable to move both her left arm and legs. CT scan showed 4.3 x 4.5 cm hemorrhagic metastatic deposit in the right frontal parietal lobe has increased in size since the prior studies. There has been further hemorrhage in the lesion. No shift of the midline structures , restarted decadron and IV keppra, NeuroOnc following.  PT/OT evaluations with recommendations for inpatient rehab admission.  Patient to be admitted today for comprehensive inpatient rehab program.  Patient's medical record from  [facility] has been reviewed by the rehabilitation admission coordinator and physician.  Past Medical History      Past Medical History:  Diagnosis Date  . Anxiety   . Brain cancer ([place])   . Breast cancer ([place])    ER+PR+ HER-2NEU negative  . Lung cancer ([place])     Family History   family history includes Cancer in her father and mother; Colon cancer in her father, mother, and paternal aunt; Hepatitis in her father; Hiatal hernia in her father; Rheumatic fever in her sister.  Prior Rehab/Hospitalizations Has the patient had major surgery during 100 days prior to admission? 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  Independent  Indoor Mobility: Did the patient need assistance with walking from room to room (with or without device)? Independent  Stairs: Did the patient need assistance with internal or external stairs (with or without device)? Independent  Functional Cognition: Did the patient need help planning regular tasks such as shopping or remembering to take medications? Independent  Home Assistive Devices / Equipment Home Assistive Devices/Equipment: Eyeglasses Home Equipment: Environmental consultant - 2 wheels, Wheelchair - manual, Bedside commode, Shower seat - built in  Prior Device Use: Indicate devices/aids used by the patient prior to current illness, exacerbation or injury? None, sister says she uses a gait belt if needed.   Prior Functional Level Current Functional Level  Bed Mobility  Independent  Max assist   Transfers  Independent  Mod assist   Mobility - Walk/Wheelchair  Independent  Max assist (Stood for 1 minute max assist.)   Upper Body Dressing  Independent  Mod to max assist  Lower Body Dressing  Independent  Mod to max assist  Grooming  Independent  Mod assist   Eating/Drinking  Independent  Mod assist   Toilet Transfer  Independent  Mod to max assist   Bladder Continence   WDL  Using bedpan   Bowel Management  WDL  Last BM 01/13/17   Stair Climbing  Independent Other (Not tried)   Counsellor, intact  Verbal, intact   Memory  Intact  Intact   Cooking/Meal Prep  Independent      Housework  Independent    Money Management  Independent    Driving  Independent     Special needs/care consideration BiPAP/CPAP No CPM  No Continuous Drip IV No Dialysis No        Life Vest No Oxygen No Special Bed No Trach Size no Wound Vac (area) No      Skin No                           Bowel mgmt:  Last BM 01/13/17 Bladder mgmt: Using bedpan.  Was using BSC at home. Diabetic mgmt No  Previous Home Environment Living Arrangements: Other relatives (sister) Available Help at Discharge: Family, Available 24 hours/day Type of Home: House Home Layout: Able to live on main level with bedroom/bathroom Home Access: Stairs to enter Entrance Stairs-Number of Steps: 2 Bathroom Shower/Tub: Gaffer, Charity fundraiser: [place]: No  Discharge Living Setting Plans for Discharge Living Setting: Lives with (comment), Other (Comment) (Plans home with sister to townhouse.) Type of Home at Discharge: Other (Comment) (Townhouse) Discharge Home Layout: Two level, Able to live on main level with bedroom/bathroom Alternate Level Stairs-Number of Steps: Flight Discharge Home Access: Stairs to enter Entrance Stairs-Number of Steps: 1 and 1/2 step entry Does the patient have any problems obtaining your medications?: No  Social/Family/Support Systems Patient Roles: Parent, Other (Comment) (Has 2 sisters and a daughter.) Contact Information: Luciana Axe - sister - [phone] Anticipated Caregiver: sister - Judson Roch Ability/Limitations of Caregiver: Has 2 sisters who live close by and can assist. Caregiver Availability: 24/7 Discharge Plan Discussed with Primary Caregiver: Yes Is Caregiver In Agreement with Plan?: Yes Does Caregiver/Family have Issues with Lodging/Transportation while Pt is in Rehab?: No  Goals/Additional Needs Patient/Family Goal for Rehab: PT/OT supervision to min assist goals Expected length of stay: 10-14 days Cultural Considerations: None Dietary Needs: Regular diet, thin liquids Equipment Needs: TBD Special Service Needs: Will need oncology followup and may need chemo q 3  weeks Pt/Family Agrees to Admission and willing to participate: Yes Program Orientation Provided & Reviewed with Pt/Caregiver Including Roles  & Responsibilities: Yes  Patient Condition: I spoke patient and her sister by phone.  Patient was independent a week ago and family hopeful to get patient back home as independent as possible.  Patient is participating with PT/OT and will benefit from 3 hours of therapy a day.  Patient can tolerate increased therapies.  I have reviewed and discussed all information with rehab MD and have approval for acute inpatient rehab admission for today.  Preadmission Screen Completed By:  Retta Diones, 01/18/2017 2:38 PM ______________________________________________________________________   Discussed status with Dr. Posey Pronto on 01/18/17 at 1428 and received telephone approval for admission today.  Admission Coordinator:  Retta Diones, time 3:30PM/Date 01/18/17   Assessment/Plan: Diagnosis: Debility  1. Does the need for close, 24 hr/day  Medical supervision in concert with the patient's rehab needs make it unreasonable for this patient to be served in a less intensive setting? Yes  2. Co-Morbidities requiring supervision/potential complications: breast CA in 2013 s/p chemofollowed by lumpectomy with mets, anxiety, ABLA 3. Due to safety, disease management, pain management and patient education, does the patient require 24 hr/day rehab nursing? Yes 4. Does the patient require coordinated care of a physician, rehab nurse, PT (1-2 hrs/day, 5 days/week) and OT (1-2 hrs/day, 5 days/week) to address physical and functional deficits in the context of the above medical diagnosis(es)? Yes Addressing deficits in the following areas: balance, endurance, locomotion, strength, transferring, bathing, dressing, toileting and psychosocial support 5. Can the patient actively participate in an intensive therapy program of at least 3 hrs of therapy 5 days a week? Yes 6. The  potential for patient to make measurable gains while on inpatient rehab is excellent 7. Anticipated functional outcomes upon discharge from inpatients are: min assist PT, min assist OT, n/a SLP 8. Estimated rehab length of stay to reach the above functional goals is: 14-17 days. 9. Does the patient have  adequate social supports to accommodate these discharge functional goals? Yes 10. Anticipated D/C setting: Home 11. Anticipated post D/C treatments: HH therapy and Home excercise program 12. Overall Rehab/Functional Prognosis: good and fair    RECOMMENDATIONS: This patient's condition is appropriate for continued rehabilitative care in the following setting: CIR Patient has agreed to participate in recommended program. Yes Note that insurance prior authorization may be required for reimbursement for recommended care.  Delice Lesch, MD, ABPMR Retta Diones 01/18/2017    Revision History

## 2017-01-19 NOTE — Care Management Note (Signed)
Inpatient Rehabilitation Center Individual Statement of Services  Patient Name:  Kelly Stuart  Date:  01/19/2017  Welcome to the [facility].  Our goal is to provide you with an individualized program based on your diagnosis and situation, designed to meet your specific needs.  With this comprehensive rehabilitation program, you will be expected to participate in at least 3 hours of rehabilitation therapies Monday-Friday, with modified therapy programming on the weekends.  Your rehabilitation program will include the following services:  Physical Therapy (PT), Occupational Therapy (OT), Speech Therapy (ST), 24 hour per day rehabilitation nursing, Therapeutic Recreaction (TR), Neuropsychology, Case Management (Social Worker), Rehabilitation Medicine, Nutrition Services and Pharmacy Services  Weekly team conferences will be held on Wednesday to discuss your progress.  Your Social Worker will talk with you frequently to get your input and to update you on team discussions.  Team conferences with you and your family in attendance may also be held.  Expected length of stay: 17-21 days  Overall anticipated outcome: overall min assist level goals  Depending on your progress and recovery, your program may change. Your Social Worker will coordinate services and will keep you informed of any changes. Your Social Worker's name and contact numbers are listed  below.  The following services may also be recommended but are not provided by the [facility] will be made to provide these services after discharge if needed.  Arrangements include referral to agencies that provide these services.  Your insurance has been verified to be:  Medicare & Commerical Your primary doctor is:  Jonathon Jordan  Pertinent information will be shared with your doctor and  your insurance company.  Social Worker:  Ovidio Kin, [place] or ([phone]  Information discussed with and copy given to patient by: Elease Hashimoto, 01/19/2017, 9:52 AM

## 2017-01-19 NOTE — Progress Notes (Signed)
Initial Nutrition Assessment  DOCUMENTATION CODES:   Non-severe (moderate) malnutrition in context of chronic illness  INTERVENTION:  - Discontinue Premier Protein - Boost Breeze po BID, each supplement provides 250 kcal and 9 grams of protein - Snacks BID  NUTRITION DIAGNOSIS:   Malnutrition (moderate) related to chronic illness, catabolic illness, cancer and cancer related treatments as evidenced by mild depletion of muscle mass, mild depletion of body fat, percent weight loss (9.9% in 1 month).  GOAL:   Patient will meet greater than or equal to 90% of their needs  MONITOR:   PO intake, Weight trends, Labs  REASON FOR ASSESSMENT:   Malnutrition Screening Tool    ASSESSMENT:   Pt with PMH of breast cancer relapse 10/2016 revealing metastasis. 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   Per chart review pt is to be initiated on systemic chemotherapy in October.  Pt reports she has not been consuming enough PTA but would not elaborate. Pt reports prior steroid she was on was causing her to be nauseous effecting her appetite. No meal completion available for pt this admission. Pt reports disliking the Premier Protein supplement. Will provide Boost Breeze to sample and snacks for patient.   Pt unsure of weight status. Per chart pt has experienced recent weight loss, 9.9% in 1 month, significant for time frame.  Nutrition focused physical exam completed findings include mild fat depletion, mild muscle depletion and no edema.   Labs reviewed; CBG 135-148, Na 134 Medications reviewed; Decadron, multivitamin, Protonix, Miralax  Diet Order:  Diet regular Room service appropriate? Yes; Fluid consistency: Thin  Skin:  Reviewed, no issues  Last BM:  01/13/17  Height:   Ht Readings from Last 1 Encounters:  01/16/17 5\' 10"  (1.778 m)     Weight:   Wt Readings from Last 1 Encounters:  01/16/17 156 lb 12 oz (71.1 kg)    Ideal Body Weight:  68.2 kg  BMI:  There is no height or weight on file to calculate BMI.  Estimated Nutritional Needs:   Kcal:  3664-4034  Protein:  107-120 grams  Fluid:  >/= 2.1 L/d  EDUCATION NEEDS:   No education needs identified at this time  Parks Ranger, MS, RDN, LDN 01/19/2017 2:40 PM

## 2017-01-19 NOTE — Evaluation (Signed)
Occupational Therapy Assessment and Plan  Patient Details  Name: Kelly Stuart MRN: [id number] Date of Birth: 11/20/1945  OT Diagnosis: ataxia, cognitive deficits, hemiplegia affecting non-dominant side, muscle weakness (generalized) and pain in joint Rehab Potential: Rehab Potential (ACUTE ONLY): Good ELOS: 3 WEEKS   Today's Date: 01/19/2017  Session 1 OT Individual Time: 0800-0910 OT Individual Time Calculation (min): 70 min     Session 2 OT Individual Time: 1445-1533 OT Individual Time Calculation (min): 48 min     Problem List:  Patient Active Problem List   Diagnosis Date Noted  . Metastatic cancer to brain (HCC) 01/18/2017  . Malnutrition of moderate degree 01/17/2017  . Hemorrhage, intracerebral (HCC) 01/16/2017  . 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 Left hemiplegia (HCC) 01/15/2017  . Acute left-sided weakness 01/15/2017  . 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 Fatigue 01/06/2017  . Focal seizures (HCC) 12/30/2016  . 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 Spine metastasis (HCC) 12/14/2016  . Brain metastases (HCC) 12/13/2016  . Metastatic lung cancer (metastasis from lung to other site), unspecified laterality (HCC) 11/28/2016  . Lung nodules 11/14/2016  . Primary cancer of upper outer quadrant of left female breast (HCC) 05/05/2011    Past Medical History:  Past Medical History:  Diagnosis Date  . Anxiety   . Brain cancer (HCC)   . Breast cancer (HCC)    ER+PR+ HER-2NEU negative  . Lung cancer (HCC)    Past Surgical History:  Past Surgical History:  Procedure Laterality Date  . BREAST LUMPECTOMY WITH NEEDLE LOCALIZATION  02/29/2012   Procedure: BREAST LUMPECTOMY WITH NEEDLE LOCALIZATION;  Surgeon: Matthew Wakefield, MD;  Location: [facility];  Service: General;  Laterality: Left;  . BREAST SURGERY    . COSMETIC SURGERY    . DILATION AND CURETTAGE OF UTERUS  1998   submucus myoma-resected   . EYE SURGERY    . FACELIFT W/BLEPHAROPLASTY  1999      . maxillofacial surgery  1980   mouth-ealign teeth-  . minifacelift  2007  . RADIOACTIVE SEED GUIDED EXCISIONAL BREAST BIOPSY N/A 02/11/2016   Procedure: LEFT BREAST RADIOACTIVE SEED GUIDED EXCISIONAL BIOPSY;  Surgeon: Matthew Wakefield, MD;  Location: [facility];  Service: General;  Laterality: N/A;  . TONSILLECTOMY      Assessment & Plan Clinical Impression: Patient is a 71 y.o. year old female who was hospitalized 2 weeks back for focal seizures and generalized weakness. She was started on Keppra and Decadron and discharged home. Patient being planned to start systemic chemotherapy in October by oncology. She had radiation to the brain done on 9/10 and completed a course of Decadron 2 weeks back. Patient started having weakness of her left side on 9/21 symptoms worsened and over the past 2 days he was progressively unable to move both her left arm and legs. CT scan showed 4.3 x 4.5 cm hemorrhagic metastatic deposit in the right frontal parietal lobe has increased in size since the prior studies.  Patient transferred to CIR on 01/18/2017 .    Patient currently requires max with basic self-care skills secondary to muscle weakness, impaired timing and sequencing, abnormal tone, unbalanced muscle activation, ataxia, decreased coordination and decreased motor planning, decreased midline orientation and decreased attention to left, decreased initiation, decreased attention, decreased awareness, decreased problem solving and decreased safety awareness and decreased sitting balance, decreased standing balance, decreased postural control, hemiplegia and decreased balance strategies.  Prior to   hospitalization, patient could complete BADL with independent .  Patient will benefit from skilled intervention to increase independence with basic self-care skills prior to discharge home with care partner.  Anticipate patient will require  minimal physical assistance and follow up home health.  OT - End of Session Endurance Deficit: Yes Endurance Deficit Description: Pt needed multiple rest breaks throughout BADLs OT Assessment Rehab Potential (ACUTE ONLY): Good OT Patient demonstrates impairments in the following area(s): Balance;Cognition;Endurance;Motor;Perception;Safety;Sensory;Vision OT Basic ADL's Functional Problem(s): Eating;Grooming;Bathing;Dressing;Toileting OT Transfers Functional Problem(s): Tub/Shower;Toilet OT Additional Impairment(s): Fuctional Use of Upper Extremity OT Plan OT Intensity: Minimum of 1-2 x/day, 45 to 90 minutes OT Frequency: 5 out of 7 days OT Duration/Estimated Length of Stay: 3 WEEKS OT Treatment/Interventions: Balance/vestibular training;Cognitive remediation/compensation;Community reintegration;Discharge planning;DME/adaptive equipment instruction;Functional electrical stimulation;Functional mobility training;Neuromuscular re-education;Patient/family education;Psychosocial support;Self Care/advanced ADL retraining;Therapeutic Activities;Splinting/orthotics;Therapeutic Exercise;UE/LE Coordination activities;UE/LE Strength taining/ROM;Visual/perceptual remediation/compensation;Wheelchair propulsion/positioning OT Self Feeding Anticipated Outcome(s): supervision OT Basic Self-Care Anticipated Outcome(s): Supervision OT Toileting Anticipated Outcome(s): Supervision OT Bathroom Transfers Anticipated Outcome(s): Min A OT Recommendation Recommendations for Other Services: Neuropsych consult;Therapeutic Recreation consult Therapeutic Recreation Interventions: Other (comment);Outing/community reintergration;Kitchen group Patient destination: Home Follow Up Recommendations: Home health OT Equipment Recommended: To be determined Skilled Therapeutic Intervention Session 1 Initial eval completed with treatment provided addressing transfers, L inattention, functional use of L UE and modified  bathing/dressing. Pt came to sitting EOB with assistance to advance LLE and mod A to elevate trunk. Pt needed mod A to maintain sitting balance initially, progressing to min with verbal cues. Pt with extensor synergies with scooting and when weight bearing to transfer w/ moderate pusher syndrome. Max A squat pivot to R side. Stedy used for sit<>stand to help maintain L knee position. Max A lift/lower in [place].  Bathing completed with Max A and utilized hand over hand A to integrate L UE into bathing. Pt with some active flexion of L arm. Max multimodal cues to attend to objects on L side, but able to look past midline with cues. Dressing completed with total A for LB dressing and Max A UB dressing. Pt left seated in wc with safety belt on and needs met.   Session 2 OT treatment session focused on L NMR, L attention, and lateral scooting. Pt brought to raised table and completed towel pushes and joint input through elbow/wrist. Active assisted shoulder shrugs and scap protraction/retraction. Pt returned to room and Stedy used to transfer pt back to bed with mix A sit<>stand. Worked on lateral scooting once seated EOB with max verbal cues to slow down and position body before trying to move. Pt need facilitation and knee block on LLE to stop extensor tone so pt could laterally scoot to L. Difficulty with head/hips relationship. Pt left semi-reclined in bed with sister present and needs met.    OT Evaluation Precautions/Restrictions  Precautions Precautions: Fall;Other (comment) Precaution Comments: decreased attention to left; CA; h/o seizures Restrictions Weight Bearing Restrictions: No Pain  none/denies pain Home Living/Prior Functioning Home Living Family/patient expects to be discharged to:: Private residence Living Arrangements: Other relatives Available Help at Discharge: Family, Available 24 hours/day Type of Home: House Home Access: Stairs to enter [employer] of Steps: 2 (1 +  1) Entrance Stairs-Rails: None Home Layout: One level Bathroom Shower/Tub: Gaffer, Charity fundraiser: Standard Additional Comments: Pt reports walk-in shower with built in seat  Lives With: Family (sister) Prior Function Level of Independence: Independent with basic ADLs, Independent with homemaking with ambulation, Independent with gait, Independent with transfers (prior to  recent decline.)  Able to Take Stairs?: Yes Comments: 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 ADL ADL ADL Comments: Please see functional navigator Vision Vision Assessment?: Yes Ocular Range of Motion: Restricted on the left Tracking/Visual Pursuits: Other (comment);Requires cues, head turns, or add eye shifts to track;Left eye does not track laterally Perception  Perception: Impaired Inattention/Neglect: Does not attend to left visual field;Does not attend to left side of body Spatial Orientation: impaired Praxis   Cognition Overall Cognitive Status: Impaired/Different from baseline Arousal/Alertness: Awake/alert Orientation Level: Person;Place;Situation Person: Oriented Place: Oriented Situation: Oriented Year: 2018 Month: September Day of Week: Correct Memory: Appears intact Immediate Memory Recall: Sock;Blue;Bed Memory Recall: Sock;Blue;Bed Memory Recall Sock: Without Cue Memory Recall Blue: Without Cue Memory Recall Bed: Without Cue Attention: Sustained Awareness: Impaired Awareness Impairment: Anticipatory impairment Behaviors: Impulsive Safety/Judgment: Impaired Sensation Sensation Light Touch: Impaired Detail Light Touch Impaired Details: Impaired LLE;Impaired LUE Proprioception: Impaired Detail Proprioception Impaired Details: Absent LUE;Absent LLE Coordination Gross Motor Movements are Fluid and Coordinated: No Fine Motor Movements are Fluid and Coordinated: No Coordination and Movement Description: impaired throughout L hemibody and trunk Motor   Motor Motor: Hemiplegia;Abnormal tone;Ataxia;Abnormal postural alignment and control Motor - Skilled Clinical Observations: L hemi; LLE extensor tone; pushes to the L Trunk/Postural Assessment  Cervical Assessment Cervical Assessment: Exceptions to WFL (tendency to maintain R rotation due to L neglect) Thoracic Assessment Thoracic Assessment: Exceptions to WFL (kyphotic posture) Lumbar Assessment Lumbar Assessment: Exceptions to WFL (posterior pelvic tilt) Postural Control Postural Control: Deficits on evaluation Trunk Control: impaired; tendency for L lateral lean Protective Responses: delayed and inadequate  Balance Balance Balance Assessed: Yes Static Sitting Balance Static Sitting - Level of Assistance: 5: Stand by assistance;4: Min assist;3: Mod assist Dynamic Sitting Balance Dynamic Sitting - Level of Assistance: 3: Mod assist;2: Max assist Static Standing Balance Static Standing - Level of Assistance: 2: Max assist Extremity/Trunk Assessment RUE Assessment RUE Assessment: Within Functional Limits LUE Assessment LUE Assessment: Exceptions to WFL LUE Strength LUE Overall Strength Comments: Brunstrom stage 3 LUE Tone LUE Tone: Modified Ashworth Modified Ashworth Scale for Grading Hypertonia LUE: Slight increase in muscle tone, manifested by a catch and release or by minimal resistance at the end of the range of motion when the affected part(s) is moved in flexion or extension   See Function Navigator for Current Functional Status.   Refer to Care Plan for Long Term Goals  Recommendations for other services: Neuropsych and Therapeutic Recreation  Other yoga? maybe a kitchen activity   Discharge Criteria: Patient will be discharged from OT if patient refuses treatment 3 consecutive times without medical reason, if treatment goals not met, if there is a change in medical status, if patient makes no progress towards goals or if patient is discharged from hospital.  The  above assessment, treatment plan, treatment alternatives and goals were discussed and mutually agreed upon: by patient  Elisabeth S Doe 01/19/2017, 4:07 PM  

## 2017-01-19 NOTE — Progress Notes (Signed)
Social Work Assessment and Plan Social Work Assessment and Plan  Patient Details  Name: Kelly Stuart MRN: [id number] Date of Birth: Jun 04, 1945  Today's Date: 01/19/2017  Problem List:  Patient Active Problem List   Diagnosis Date Noted  . Metastatic cancer to brain ([place]) 01/18/2017  . Malnutrition of moderate degree 01/17/2017  . Hemorrhage, intracerebral ([place]) 01/16/2017  . 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 Left hemiplegia ([place]) 01/15/2017  . Acute left-sided weakness 01/15/2017  . 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 Fatigue 01/06/2017  . Focal seizures ([place]) 12/30/2016  . 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 Spine metastasis ([place]) 12/14/2016  . Brain metastases ([place]) 12/13/2016  . Metastatic lung cancer (metastasis from lung to other site), unspecified laterality ([place]) 11/28/2016  . Lung nodules 11/14/2016  . Primary cancer of upper outer quadrant of left female breast ([place]) 05/05/2011   Past Medical History:  Past Medical History:  Diagnosis Date  . Anxiety   . Brain cancer ([place])   . Breast cancer ([place])    ER+PR+ HER-2NEU negative  . Lung cancer [facility])    Past Surgical History:  Past Surgical History:  Procedure Laterality Date  . BREAST LUMPECTOMY WITH NEEDLE LOCALIZATION  02/29/2012   Procedure: BREAST LUMPECTOMY WITH NEEDLE LOCALIZATION;  Surgeon: Rolm Bookbinder, MD;  Location: [place];  Service: General;  Laterality: Left;  . BREAST SURGERY    . COSMETIC SURGERY    . DILATION AND CURETTAGE OF UTERUS  1998   submucus myoma-resected  . EYE SURGERY    . FACELIFT W/BLEPHAROPLASTY  1999      . maxillofacial surgery  1980   mouth-ealign teeth-  . minifacelift  2007  . RADIOACTIVE SEED GUIDED EXCISIONAL BREAST BIOPSY N/A 02/11/2016   Procedure: LEFT BREAST RADIOACTIVE SEED GUIDED EXCISIONAL BIOPSY;  Surgeon: Rolm Bookbinder, MD;  Location: [place];  Service: General;   Laterality: N/A;  . TONSILLECTOMY     Social History:  reports that she quit smoking about 25 years ago. Her smoking use included Cigarettes. She smoked 2.00 packs per day. She has never used smokeless tobacco. She reports that she drinks about 1.8 oz of alcohol per week . She reports that she does not use drugs.  Family / Support Systems Marital Status: Divorced Patient Roles: Parent, Other (Comment) (siblings) Children: 67 yo son who lives with his Dad-pt's ex-husband Other Supports: Sandre Kitty [phone]  Barnetta Hammersmith [phone] Anticipated Caregiver: Judson Roch and sister from [place] who plans to come down to help Judson Roch Ability/Limitations of Caregiver: Two sister's are local and two are out of state Caregiver Availability: 24/7 Family Dynamics: Close knit with her sister's their were five of them and all are very close. Her son has coem to see her while beng in the hospital. She has friends and church members who come to see here and provide support.  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 Have encouraged sister and pt to do a HCPOA and /or POA   Abuse/Neglect Physical Abuse: Denies Verbal Abuse: Denies Sexual Abuse: Denies Exploitation of patient/patient's resources: Denies Self-Neglect: Denies  Emotional Status Pt's affect, behavior adn adjustment status: Pt is motivated to do well and voiced she has learned many life lessons through  all of this. She has become quite humble throughout this process. She was not one to ask for help and now she can't help but ask for help. She is hoping she does well and can recover from this and become independent again. Recent Psychosocial Issues:  Cancer treatments and complications, losing her independence and mioving in with her sister and brother in-law Pyschiatric History: No history seems to be coping appropriately at this time, she is grateful to be on rehab and looking forward to getting intensive therapies while here. Feel she would benefit from seeing neuro-psych while here due to all she has been through in a short time-cancer diagnosis and now her stroke. Substance Abuse History: Wine drinker-social  Patient / Family Perceptions, Expectations & Goals Pt/Family understanding of illness & functional limitations: Pt and sister can explain her stroke and deficits. Pt seems to have a basic understanding but has inattention and limited insight into her deficits. Both have spoken with the MD's and feel their questions have been answered and addressed. Premorbid pt/family roles/activities: Mother, sister, friend, retiree, church member, etc Anticipated changes in roles/activities/participation: resume Pt/family expectations/goals: Pt states: " The stroke is worse than the cancer, I'm not concerned about that, but need the blood to re-absorb in my brain to make progress here."  Judson Roch states: " I hope she does well here, she has been through a lot since July."  [employer]: Other (Comment) ([place] [facility] OP-PT) Premorbid Home Care/DME Agencies: Other (Comment) (has bsc, wc, rw already) Transportation available at discharge: [employer] referrals recommended: Neuropsychology, Support group (specify)  Discharge Planning Living Arrangements: Other relatives Support Systems: Other relatives, Friends/neighbors Type of Residence: Private residence Insurance Resources: [employer], Multimedia programmer (specify) (Manufacturing engineer) Financial Resources: Radio broadcast assistant Screen Referred: No Living Expenses: Lives with family (Staying with sister and borhter in-law for the last 2 weeks) Money  Management: Family Does the patient have any problems obtaining your medications?: No Home Management: Patient did this up til 2 weeks ago Patient/Family Preliminary Plans: Plans to go to [employer] where she has been staying the past two weeks when she declined. Judson Roch doesn't work and can assist her. Will await therapy evaluations and work on a safe discharge plan. Sister's are very supportive of pt. Social Work Anticipated Follow Up Needs: HH/OP, Support Group  Clinical Impression Pleasant female who has gone through much in the past few months with cancer coming back and treatments and now her stroke. She is one who has always been very independent and assisted others. She has focused on the life lessons and the here and now. Will make neuro-psych referral and work with on a safe and realistic discharge plan for her. See how well pt does here and the progress she makes.  Elease Hashimoto 01/19/2017, 12:37 PM

## 2017-01-19 NOTE — Evaluation (Addendum)
Physical Therapy Assessment and Plan  Patient Details  Name: Kelly Stuart MRN: [id number] Date of Birth: 1945/11/24  PT Diagnosis: Abnormal posture, Ataxia, Difficulty walking, Hemiplegia non-dominant, Impaired cognition, Impaired sensation, Muscle weakness and Pain in joint Rehab Potential: Good ELOS: 17-21 days   Today's Date: 01/19/2017 PT Individual Time: 0925-1030 PT Individual Time Calculation (min): 65 min    Problem List:  Patient Active Problem List   Diagnosis Date Noted  . Metastatic cancer to brain ([place]) 01/18/2017  . Malnutrition of moderate degree 01/17/2017  . Hemorrhage, intracerebral ([place]) 01/16/2017  . 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 Left hemiplegia ([place]) 01/15/2017  . Acute left-sided weakness 01/15/2017  . 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 Fatigue 01/06/2017  . Focal seizures ([place]) 12/30/2016  . 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 Brain metastases ([place]) 12/13/2016  . Metastatic lung cancer (metastasis from lung to other site), unspecified laterality ([place]) 11/28/2016  . Lung nodules 11/14/2016  . Primary cancer of upper outer quadrant of left female breast ([place]) 05/05/2011    Past Medical History:  Past Medical History:  Diagnosis Date  . Anxiety   . Brain cancer ([place])   . Breast cancer ([place])    ER+PR+ HER-2NEU negative  . Lung cancer [facility])    Past Surgical History:  Past Surgical History:  Procedure Laterality Date  . BREAST LUMPECTOMY WITH NEEDLE LOCALIZATION  02/29/2012   Procedure: BREAST LUMPECTOMY WITH NEEDLE LOCALIZATION;  Surgeon: Rolm Bookbinder, MD;  Location: [place];  Service: General;  Laterality: Left;  . BREAST SURGERY    . COSMETIC SURGERY    . DILATION AND CURETTAGE OF UTERUS  1998   submucus myoma-resected  . EYE SURGERY    . FACELIFT W/BLEPHAROPLASTY  1999      . maxillofacial surgery  1980   mouth-ealign  teeth-  . minifacelift  2007  . RADIOACTIVE SEED GUIDED EXCISIONAL BREAST BIOPSY N/A 02/11/2016   Procedure: LEFT BREAST RADIOACTIVE SEED GUIDED EXCISIONAL BIOPSY;  Surgeon: Rolm Bookbinder, MD;  Location: [place];  Service: General;  Laterality: N/A;  . TONSILLECTOMY      Assessment & Plan Clinical Impression: Patient is a 71 y.o.female prior h/o breast CA in 2013 s/p chemofollowed by lumpectomy.  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 She was started on Keppra and Decadron and discharged home. Patient being planned to start systemic chemotherapy in October by oncology. She had radiation to the brain done on 9/10 and completed a course of Decadron 2 weeks back. Patient started having weakness of her left side on 9/21 symptoms worsened and over the past 2 days he was progressively unable to move both her left arm and legs. CT scan showed 4.3 x 4.5 cm hemorrhagic metastatic deposit in the right frontal parietal lobe has increased in size since the prior studies. There has been further hemorrhage in the lesion. No shift of the midline structures , restarted decadron and IV keppra, NeuroOnc following.  PT/OT evaluations with recommendations for inpatient rehab admission.  Patient to be admitted today for comprehensive inpatient rehab program.Patient transferred to CIR on 01/18/2017 .   Patient currently requires max to +2 assist with mobility secondary to muscle weakness, muscle joint tightness and muscle paralysis, decreased cardiorespiratoy endurance, impaired timing and sequencing, abnormal tone, ataxia, decreased  coordination and decreased motor planning, decreased midline orientation and decreased attention to left, decreased attention, decreased awareness, decreased problem solving and decreased safety awareness and decreased sitting balance, decreased standing  balance, decreased postural control, hemiplegia and decreased balance strategies.  Prior to hospitalization, patient was independent  with mobility and lived with Family (sister) in a House home.  Home access is 2 (1 + 1)Stairs to enter.  Patient will benefit from skilled PT intervention to maximize safe functional mobility, minimize fall risk and decrease caregiver burden for planned discharge home with 24 hour assist.  Anticipate patient will benefit from follow up [facility] at discharge.  PT Assessment Rehab Potential (ACUTE/IP ONLY): Good PT Barriers to Discharge: Pending chemo/radiation;Inaccessible home environment PT Barriers to Discharge Comments: will have to negotiate stairs for home entry PT Patient demonstrates impairments in the following area(s): Balance;Behavior;Endurance;Motor;Pain;Perception;Safety;Sensory;Skin Integrity PT Transfers Functional Problem(s): Bed to Chair;Bed Mobility;Car;Furniture PT Locomotion Functional Problem(s): Ambulation;Stairs;Wheelchair Mobility PT Plan PT Intensity: Minimum of 1-2 x/day ,45 to 90 minutes PT Frequency: 5 out of 7 days PT Duration Estimated Length of Stay: 17-21 days PT Treatment/Interventions: Ambulation/gait training;Balance/vestibular training;Cognitive remediation/compensation;Community reintegration;Discharge planning;Disease management/prevention;DME/adaptive equipment instruction;Functional mobility training;Neuromuscular re-education;Pain management;Patient/family education;Psychosocial support;Skin care/wound management;Splinting/orthotics;Stair training;Therapeutic Activities;Therapeutic Exercise;UE/LE Strength taining/ROM;UE/LE Coordination activities;Visual/perceptual remediation/compensation;Wheelchair propulsion/positioning PT Transfers Anticipated Outcome(s): min assist PT Locomotion Anticipated Outcome(s): supervision w/c mobility; min assist household gait PT Recommendation Follow Up Recommendations: Home health PT;24 hour  supervision/assistance Patient destination: Home Equipment Recommended: To be determined  Skilled Therapeutic Intervention Evaluation completed (see details above and below) with education on PT POC and goals and individual treatment initiated with focus on NMR throughout session to address postural control, reorientation to midline, and functional balance and awareness of self in space including LUE/LLE during functional mobility including sit < > stands, scoot/squat pivot transfers, reciprocal scooting, and and W/c mobility training requiring up to max assist for  Sequencing and coordination using hemitechnique. Utilized stedy for toilet transfers and toileting needs with max assist overall.    PT Evaluation Precautions/Restrictions Precautions Precautions: Fall;Other (comment) Precaution Comments: decreased attention to left; CA; h/o seizures Restrictions Weight Bearing Restrictions: No Pain  No current pain.  Home Living/Prior Functioning Home Living Available Help at Discharge: Family;Available 24 hours/day Type of Home: House Home Access: Stairs to enter [employer] of Steps: 2 (1 + 1) Entrance Stairs-Rails: None Home Layout: One level  Lives With: Family (sister) Prior Function Level of Independence: Independent with basic ADLs;Independent with homemaking with ambulation;Independent with gait;Independent with transfers (prior to recent decline.)  Able to Take Stairs?: Yes Comments: Worked as a Geophysicist/field seismologist and attends hot yoga regularly. Vision/Perception  Perception Perception: Impaired Inattention/Neglect: Does not attend to left visual field;Does not attend to left side of body Spatial Orientation: impaired  Cognition Overall Cognitive Status: Impaired/Different from baseline Attention: Sustained Awareness: Impaired Awareness Impairment: Anticipatory impairment Behaviors: Impulsive Safety/Judgment: Impaired Sensation Sensation Light Touch:  Impaired Detail Light Touch Impaired Details: Impaired LLE;Impaired LUE Proprioception: Impaired Detail Proprioception Impaired Details: Absent LUE;Absent LLE Coordination Gross Motor Movements are Fluid and Coordinated: No Fine Motor Movements are Fluid and Coordinated: No Coordination and Movement Description: impaired throughout L hemibody and trunk Motor  Motor Motor: Hemiplegia;Abnormal tone;Ataxia;Abnormal postural alignment and control Motor - Skilled Clinical Observations: L hemi; LLE extensor tone; pushes to the L     Trunk/Postural Assessment  Cervical Assessment Cervical Assessment: Exceptions to [facility] (tendency to maintain R rotation due to L neglect) Thoracic Assessment Thoracic Assessment: Exceptions to [facility] (kyphotic posture) Lumbar Assessment  Lumbar Assessment: Exceptions to [facility] (posterior pelvic tilt) Postural Control Postural Control: Deficits on evaluation Trunk Control: impaired; tendency for L lateral lean Protective Responses: delayed and inadequate  Balance Balance Balance Assessed: Yes Static Sitting Balance Static Sitting - Level of Assistance: 5: Stand by assistance;4: Min assist;3: Mod assist Dynamic Sitting Balance Dynamic Sitting - Level of Assistance: 3: Mod assist;2: Max assist Static Standing Balance Static Standing - Level of Assistance: 2: Max assist Dynamic Standing Balance Dynamic Standing - Level of Assistance: 1: +2 Total assist Extremity Assessment      RLE Assessment RLE Assessment: Within Functional Limits LLE Assessment LLE Assessment: Exceptions to [facility] LLE Strength LLE Overall Strength Comments: 3-/5 knee extension; active ankle movement noted; able to activate hip flexors during gait LLE Tone LLE Tone Comments: extensor tone noted during mobility   See Function Navigator for Current Functional Status.   Refer to Care Plan for Long Term Goals  Recommendations for other services: Other: potentially SLP?  Discharge Criteria:  Patient will be discharged from PT if patient refuses treatment 3 consecutive times without medical reason, if treatment goals not met, if there is a change in medical status, if patient makes no progress towards goals or if patient is discharged from hospital.  The above assessment, treatment plan, treatment alternatives and goals were discussed and mutually agreed upon: by patient      Session #2: 1105-1145 (40 min individual time) Denies pain. PT adjusted leg rests for improved alignment and positioning to increase sitting tolerance. Cues for positioning of LUE on lap tray for UE support. NMR for gait with 3 muskateers technique and close w/c follow. During gait requires manual facilitation for weightshift and postural control, cues for sequencing, and assist for LLE placement (pt able to activate hip flexors during swing and knee extension in stance). Pt requires manual facilitation for weightshift during all transitional movements, cues to decrease strong L Lateral lean and cues for slowed movement. During squat pivot transfer training blocked LLE due to extensor tone and rolling of LLE and provided manual and verbal cues for slowed movement and attention to LUE/LLE. Using mirror for visual feedback seated EOM, address postural control and reorientation to midline during scooting EOM for carryover during transfers demonstrating improved carryover.  Kelly Stuart, PT, DPT  01/19/2017, 12:31 PM

## 2017-01-19 NOTE — Progress Notes (Signed)
Patient information reviewed and entered into eRehab system by Stella Encarnacion, RN, CRRN, PPS Coordinator.  Information including medical coding and functional independence measure will be reviewed and updated through discharge.     Per nursing patient was given "Data Collection Information Summary for Patients in Inpatient Rehabilitation Facilities with attached "Privacy Act Statement-Health Care Records" upon admission.  

## 2017-01-19 NOTE — H&P (Addendum)
Physical Medicine and Rehabilitation Admission H&P         Chief Complaint  Patient presents with  . Brain mets with hemorrhage      Left sided weakness and left inattention affecting mobility/self care tasks.       HPI: Kelly Stuart 71 y.o. female with history of breast cancer s/p lumpectomy 02/2012 with relapse 10/2016 and work up revealing metastasis to  RUL, B-adrenals, ribs, thoracic spine and soft tissue left lateral chest, shoulder and left thigh and hemorrhagic deposit right frontoparietal lobe and left medial parietal lobe. She underwent stereotactic radiosurgery right frontal lobe by Dr. Rita Ohara and has completed XRT to brain.  She was admitted for focal motor seizures  affecting LUE/LLE  and was started on Keppra as well as steroids on 9/9. She started developing progressive left sided weakness on 9/21 that progressed to diffuse weakness and left inattention.  She had decreased keppra to 500 mg bid due to lethargy and questioned weakness due to post-ictal state. As symptoms did not improve, she presented to ED on 9/23 for work up. . CT head done revealing increase in size of hemorrhagic deposit right frontal lobe with further hemorrhage in lesion. She was started on IV decadron 4 mg bid with improvement. Dr. Mickeal Skinner evaluated patient and recommended continuing steroids bid as edema and hemorrhagic conversion felt to be likely be due to NSAIDs and recent radiotherapy.  No further work up needed and plans for systemic therapy in the future. Therapy recommended by MD and rehab team due to significant deficits in mobility and ability to carry out ADL tasks.   Slept well last noc      Review of Systems  HENT: Negative for hearing loss and tinnitus.   Eyes: Negative for blurred vision and double vision.  Respiratory: Negative for cough and shortness of breath.   Cardiovascular: Negative for chest pain and palpitations.  Gastrointestinal: Negative for abdominal pain, heartburn and nausea.   Genitourinary: Negative for dysuria, frequency and urgency.  Musculoskeletal: Positive for back pain, joint pain (Sciatica LLE) and myalgias.  Skin: Negative for itching and rash.  Neurological: Positive for sensory change (sensory changes around the waist due to recent shingles? ), focal weakness (left foot drop X years/foot rolls.  Left sided weakness  since last friday), seizures and weakness. Negative for dizziness, speech change and headaches.  Psychiatric/Behavioral: Positive for memory loss. The patient is nervous/anxious.           Past Medical History:  Diagnosis Date  . Anxiety    . Brain cancer ([place])    . Breast cancer ([place])      ER+PR+ HER-2NEU negative  . Lung cancer [facility])             Past Surgical History:  Procedure Laterality Date  . BREAST LUMPECTOMY WITH NEEDLE LOCALIZATION   02/29/2012    Procedure: BREAST LUMPECTOMY WITH NEEDLE LOCALIZATION;  Surgeon: Rolm Bookbinder, MD;  Location: [place];  Service: General;  Laterality: Left;  . BREAST SURGERY      . COSMETIC SURGERY      . DILATION AND CURETTAGE OF UTERUS   1998    submucus myoma-resected  . EYE SURGERY      . FACELIFT W/BLEPHAROPLASTY   1999       . maxillofacial surgery   1980    mouth-ealign teeth-  . minifacelift   2007  . RADIOACTIVE SEED GUIDED EXCISIONAL BREAST BIOPSY N/A 02/11/2016    Procedure: LEFT  BREAST RADIOACTIVE SEED GUIDED EXCISIONAL BIOPSY;  Surgeon: Rolm Bookbinder, MD;  Location: [place];  Service: General;  Laterality: N/A;  . TONSILLECTOMY               Family History  Problem Relation Age of Onset  . Cancer Mother    . Colon cancer Mother    . Cancer Father    . Colon cancer Father    . Hepatitis Father    . Hiatal hernia Father    . Rheumatic fever Sister    . Colon cancer Paternal Aunt        Social History:  Lived alone till couple of weeks ago. She was working full time as Geophysicist/field seismologist and doing hot yoga 5 days a week till  early August (shingles) . Per reports that she quit smoking about 25 years ago. Her smoking use included Cigarettes. She smoked 2.00 packs per day. She has never used smokeless tobacco. She reports that she drinks a glass of wine daily.  She reports that she does not use drugs.           Allergies  Allergen Reactions  . Codeine Nausea And Vomiting  . Penicillins Rash      Has patient had a PCN reaction causing immediate rash, facial/tongue/throat swelling, SOB or lightheadedness with hypotension: Yes Has patient had a PCN reaction causing severe rash involving mucus membranes or skin necrosis: No Has patient had a PCN reaction that required hospitalization: Yes Has patient had a PCN reaction occurring within the last 10 years: No If all of the above answers are "NO", then may proceed with Cephalosporin use.    . Sulfa Antibiotics Rash  . Tetracyclines & Related Rash      Patient states Acromycin            Medications Prior to Admission  Medication Sig Dispense Refill  . amLODipine (NORVASC) 5 MG tablet Take 5 mg by mouth daily.      Marland Kitchen escitalopram (LEXAPRO) 20 MG tablet Take 20 mg by mouth daily.      Marland Kitchen ibuprofen (ADVIL,MOTRIN) 200 MG tablet Take 200 mg by mouth 2 (two) times daily as needed for headache or moderate pain.      Marland Kitchen levETIRAcetam (KEPPRA) 500 MG tablet Take 2 tablets (1,000 mg total) by mouth 2 (two) times daily. 120 tablet 3  . naproxen sodium (ANAPROX) 220 MG tablet Take 220 mg by mouth daily as needed (pain).      Marland Kitchen dexamethasone (DECADRON) 2 MG tablet Take 2 tablets (4 mg total) by mouth 2 (two) times daily with a meal. 53m BIDx 4 days, then 247mBID x4 days, then 69m40maily x4 days (Patient not taking: Reported on 01/13/2017) 28 tablet 0      Drug Regimen Review Drug regimen was reviewed and the following issues were identified and addressed reduce decadron to 4mg50mily   Home: Home Living Family/patient expects to be discharged to:: Private residence Living  Arrangements: Other relatives (sister) Available Help at Discharge: Family, Available 24 hours/day Type of Home: House Home Access: Stairs to enter EntrCenterPoint EnergySteps: 2 Home Layout: Able to live on main level with bedroom/bathroom Bathroom Shower/Tub: WalkGafferor BathConocoPhillipslet: Standard Home Equipment: WalkEnvironmental consultant wheels, Wheelchair - manual, Bedside commode, Shower seat - built in   Functional History: Prior Function Level of Independence: Independent Comments: independent living alone prior to 2 weeks ago   Functional Status:  Mobility: Bed  Mobility Overal bed mobility: Needs Assistance Bed Mobility: Rolling, Supine to Sit, Sit to Supine Rolling: Min assist Supine to sit: Mod assist Sit to supine: Mod assist General bed mobility comments: pt  Transfers Overall transfer level: Needs assistance Equipment used: None Transfers: Sit to/from Stand Sit to Stand: Max assist General transfer comment: Upon attempted standing LLE demonstated extension pattern. did not perform transfer this session - only sit to stand.   Ambulation/Gait General Gait Details: unable   ADL: ADL Overall ADL's : Needs assistance/impaired Eating/Feeding: Minimal assistance, Bed level (HOB raised) Eating/Feeding Details (indicate cue type and reason): focused on finding items on the tray and attending to left side. Pt is improving and was able to track past midline with the left eye this OT session! Grooming: Moderate assistance, Cueing for safety, Sitting, Cueing for compensatory techniques, Cueing for sequencing Grooming Details (indicate cue type and reason): Vc to find needed items   Cognition: Cognition Overall Cognitive Status: Impaired/Different from baseline Orientation Level: Oriented X4 Cognition Arousal/Alertness: Awake/alert Behavior During Therapy: WFL for tasks assessed/performed Overall Cognitive Status: Impaired/Different from baseline Area of Impairment:  Awareness Memory: Decreased short-term memory Safety/Judgement: Decreased awareness of safety Problem Solving: Difficulty sequencing, Requires verbal cues General Comments: L inattention      Blood pressure 107/64, pulse 73, temperature 98.4 F (36.9 C), temperature source Oral, resp. rate 17, height _0  (1.778 m), weight 71.1 kg (156 lb 12 oz), last menstrual period 08/23/2008, SpO2 97 %. Physical Exam  Nursing note and vitals reviewed. Constitutional: She is oriented to person, place, and time. She appears well-developed and well-nourished. No distress.  HENT:  Head: Normocephalic and atraumatic.  Mouth/Throat: Oropharynx is clear and moist.  Eyes: Pupils are equal, round, and reactive to light. Conjunctivae are normal. Right eye exhibits no discharge. Left eye exhibits abnormal extraocular motion.  Neck: Normal range of motion. Neck supple.  Cardiovascular: Normal rate and regular rhythm.   No murmur heard. Respiratory: Effort normal and breath sounds normal. No stridor. No respiratory distress. She has no wheezes. She exhibits no tenderness.  GI: Soft. Bowel sounds are normal. She exhibits no distension. There is no tenderness.  Musculoskeletal: She exhibits no edema or tenderness.  Neurological: She is alert and oriented to person, place, and time. A cranial nerve deficit is present.  Up in chair with left inattention with right gaze preference and left lean.  Left facial weakness. Speech clear. Able to follow basic commands without difficulty. Has decreased insight/awareness into deficits.   Dysconjugate gaze-left and unable to move eyes fully to left field. Left hemiparesis with sensory deficits. RUE/RLE 4+/5. LUE: Deltoid/biceps 0/5, triceps 0/5, intrinsics 0/5, HF 3/5, KF/KE 1-2/5--question apraxia. Left foot drop 0/5 ankle PF/DF.   Sensation absent LT LUE and LLE Skin: Skin is warm and dry. She is not diaphoretic.  Psychiatric: She has a normal mood and affect. Her behavior is  normal. Thought content normal.      Lab Results Last 48 Hours        Results for orders placed or performed during the hospital encounter of 01/15/17 (from the past 48 hour(s))  CBC     Status: Abnormal    Collection Time: 01/17/17  4:23 AM  Result Value Ref Range    WBC 7.9 4.0 - 10.5 K/uL    RBC 3.92 3.87 - 5.11 MIL/uL    Hemoglobin 11.5 (L) 12.0 - 15.0 g/dL    HCT 34.3 (L) 36.0 - 46.0 %    MCV 87.5  78.0 - 100.0 fL    MCH 29.3 26.0 - 34.0 pg    MCHC 33.5 30.0 - 36.0 g/dL    RDW 14.8 11.5 - 15.5 %    Platelets 176 150 - 400 K/uL  Basic metabolic panel     Status: Abnormal    Collection Time: 01/17/17  4:23 AM  Result Value Ref Range    Sodium 135 135 - 145 mmol/L    Potassium 3.9 3.5 - 5.1 mmol/L    Chloride 105 101 - 111 mmol/L    CO2 23 22 - 32 mmol/L    Glucose, Bld 153 (H) 65 - 99 mg/dL    BUN 11 6 - 20 mg/dL    Creatinine, Ser 0.49 0.44 - 1.00 mg/dL    Calcium 8.9 8.9 - 10.3 mg/dL    GFR calc non Af Amer >60 >60 mL/min    GFR calc Af Amer >60 >60 mL/min      Comment: (NOTE) The eGFR has been calculated using the CKD EPI equation. This calculation has not been validated in all clinical situations. eGFR's persistently <60 mL/min signify possible Chronic Kidney Disease.      Anion gap 7 5 - 15  Glucose, capillary     Status: Abnormal    Collection Time: 01/17/17  8:07 AM  Result Value Ref Range    Glucose-Capillary 148 (H) 65 - 99 mg/dL  Glucose, capillary     Status: Abnormal    Collection Time: 01/18/17  7:58 AM  Result Value Ref Range    Glucose-Capillary 138 (H) 65 - 99 mg/dL      Imaging Results (Last 48 hours)  No results found.           Medical Problem List and Plan: 1.  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  DVT Prophylaxis/Anticoagulation: Mechanical: Sequential compression devices, below knee Bilateral lower extremities 3. Pain Management: tylenol prn 4. Mood: Stable on home dose Lexapro with ativan at  nights to help with sleep/anxiety. Sister supportive and mood appears stable.  LCSW to follow for evaluation and support.  5. Neuropsych: This patient is capable of making decisions on her own behalf. 6. Skin/Wound Care: routine pressure relief measures. Maintain adequate nutritional and hydration status.  7. Fluids/Electrolytes/Nutrition: Monitor I/O. Check lytes in am.  8. 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  9. 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  10 GERD: due to steroids. Managed with PPI.  11. Right frontoparietal metastatic disease: Hemorrhage and increase in edema treated with decadron  4 mg bid D# 4. Hem/Onc recommended decreasing to once a day at discharge--decrease to daily 9/27 12. HTN: Monitor BP bid. Continue Norvasc daily.  13. Constipation: Has not had BM since last Tuesday. Give two doses of miralax today. Enema tomorrow if not results.      Post Admission Physician Evaluation: 1. Functional deficits secondary  to Left hemiparesis. 2. Patient is admitted to receive collaborative, interdisciplinary care between the physiatrist, rehab nursing staff, and therapy team. 3. Patient's level of medical complexity and substantial therapy needs in context of that medical necessity cannot be provided at a lesser intensity of care such as a SNF. 4. Patient has experienced substantial functional loss from his/her baseline which was documented above under the "Functional History" and "Functional Status" headings.  Judging by the patient's diagnosis, physical exam, and functional history, the patient has potential for  functional progress which will result in measurable gains while on inpatient rehab.  These gains will be of substantial and practical use upon discharge  in facilitating mobility and self-care at the household level. 5. Physiatrist will provide 24 hour management of medical needs as well as oversight of the  therapy plan/treatment and provide guidance as appropriate regarding the interaction of the two. 6. The Preadmission Screening has been reviewed and patient status is unchanged unless otherwise stated above. 7. 24 hour rehab nursing will assist with bladder management, bowel management, safety, skin/wound care, disease management, medication administration, pain management and patient education  and help integrate therapy concepts, techniques,education, etc. 8. PT will assess and treat for/with: pre gait, gait training, endurance , safety, equipment, neuromuscular re education.   Goals are: Min A. 9. OT will assess and treat for/with: ADLs, Cognitive perceptual skills, Neuromuscular re education, safety, endurance, equipment.   Goals are: MIn A. Therapy may proceed with showering this patient. 10. SLP will assess and treat for/with: cognition, Left neglect,swallowing.  Goals are: MinA. 11. Case Management and Social Worker will assess and treat for psychological issues and discharge planning. 12. Team conference will be held weekly to assess progress toward goals and to determine barriers to discharge. 13. Patient will receive at least 3 hours of therapy per day at least 5 days per week. 14. ELOS: 14-18d       15. Prognosis:  fair         Charlett Blake M.D. [facility] FAAPM&R (Sports Med, Neuromuscular Med) Diplomate Am Board of Electrodiagnostic Med  Flora Lipps 01/18/2017

## 2017-01-20 ENCOUNTER — Inpatient Hospital Stay (HOSPITAL_COMMUNITY): Payer: Medicare Other | Admitting: Physical Therapy

## 2017-01-20 ENCOUNTER — Ambulatory Visit: Payer: Medicare Other | Admitting: Physical Therapy

## 2017-01-20 ENCOUNTER — Inpatient Hospital Stay (HOSPITAL_COMMUNITY): Payer: Medicare Other | Admitting: Occupational Therapy

## 2017-01-20 ENCOUNTER — Inpatient Hospital Stay (HOSPITAL_COMMUNITY): Payer: Medicare Other

## 2017-01-20 DIAGNOSIS — R609 Edema, unspecified: Secondary | ICD-10-CM

## 2017-01-20 LAB — GLUCOSE, CAPILLARY
GLUCOSE-CAPILLARY: 106 mg/dL — AB (ref 65–99)
GLUCOSE-CAPILLARY: 113 mg/dL — AB (ref 65–99)
Glucose-Capillary: 109 mg/dL — ABNORMAL HIGH (ref 65–99)

## 2017-01-20 MED ORDER — PRO-STAT SUGAR FREE PO LIQD
30.0000 mL | Freq: Two times a day (BID) | ORAL | Status: DC
  Administered 2017-01-20 – 2017-01-24 (×6): 30 mL via ORAL
  Filled 2017-01-20 (×11): qty 30

## 2017-01-20 NOTE — Progress Notes (Signed)
*  PRELIMINARY RESULTS* Vascular Ultrasound Bilateral lower extremity venous duplex has been completed.  Preliminary findings: No evidence of deep vein thrombosis or baker's cysts bilaterally.  A small focal segment of intermuscular thrombus is noted in the left gastrocnemius vein of the left calf.  Preliminary results given to patients nurse, [facility] @ 13:55  Everrett Coombe 01/20/2017, 1:56 PM

## 2017-01-20 NOTE — Progress Notes (Signed)
Physical Therapy Session Note  Patient Details  Name: Kelly Stuart MRN: [id number] Date of Birth: 05-05-1945  Today's Date: 01/20/2017 PT Individual Time: 1100-1200 PT Individual Time Calculation (min): 60 min   Short Term Goals: Week 1:  PT Short Term Goal 1 (Week 1): Pt will be able to perform functional transfer with mod assist PT Short Term Goal 2 (Week 1): Pt will be able to propel w/c with hemi-technique with min assist PT Short Term Goal 3 (Week 1): Pt will be able to gait x 30' with max assist of 1 (w/c follow for safety) PT Short Term Goal 4 (Week 1): Pt will be able to manage LUE during transfers with mod verbal cues  Skilled Therapeutic Interventions/Progress Updates:    no c/o pain throughout session; session focused on gait training as well as functional mobility and addressing L side neglect.   Pt completed transfers throughout session with max A using squat pivot transfers. Pt amb 12' + 10' with 3 musketeers style assistance requiring PT to give mod A to advance L LE, max A to place and stabilize L LE throughout ambulation. Pt received education on w/c propulsion using R UE and LE and propelled w/c 100' with max A with tactile cues and manual facilitation from PT for proper sequencing of R LE. Pt performed Dynavision seated in w/c with only the lower quadrants, 60 sec for first trial with a score of 20, 3:00 reaction time, 4:07 on L and 1.68 on R for reaction time, 2 min for second trial with a score of 50, 2:40 reaction time, 2.71 on L and 1.85 on R for reaction time. Dynavision used to work on scanning and encourage L side attention as well as weight shifting in sitting. Pt left upright in recliner in room, call bell within reach and all needs addressed.   Therapy Documentation Precautions:  Precautions Precautions: Fall, Other (comment) Precaution Comments: decreased attention to left; CA; h/o seizures Restrictions Weight Bearing Restrictions: No   See Function  Navigator for Current Functional Status.   Therapy/Group: Individual Therapy  Harriet Masson 01/20/2017, 4:48 PM

## 2017-01-20 NOTE — Progress Notes (Signed)
Subjective/Complaints: Great day in therapy yesterdy  ROS- - constipation , no abd pain , no N/V, no breathing problems, no pains  Objective: Vital Signs: Blood pressure (!) 106/53, pulse 64, temperature 98.3 F (36.8 C), temperature source Oral, resp. rate 18, last menstrual period 08/23/2008, SpO2 98 %. No results found. Results for orders placed or performed during the hospital encounter of 01/18/17 (from the past 72 hour(s))  CBC WITH DIFFERENTIAL     Status: Abnormal   Collection Time: 01/19/17  5:20 AM  Result Value Ref Range   WBC 6.3 4.0 - 10.5 K/uL   RBC 3.84 (L) 3.87 - 5.11 MIL/uL   Hemoglobin 10.8 (L) 12.0 - 15.0 g/dL   HCT 33.9 (L) 36.0 - 46.0 %   MCV 88.3 78.0 - 100.0 fL   MCH 28.1 26.0 - 34.0 pg   MCHC 31.9 30.0 - 36.0 g/dL   RDW 15.0 11.5 - 15.5 %   Platelets 165 150 - 400 K/uL   Neutrophils Relative % 82 %   Neutro Abs 5.2 1.7 - 7.7 K/uL   Lymphocytes Relative 9 %   Lymphs Abs 0.6 (L) 0.7 - 4.0 K/uL   Monocytes Relative 9 %   Monocytes Absolute 0.5 0.1 - 1.0 K/uL   Eosinophils Relative 0 %   Eosinophils Absolute 0.0 0.0 - 0.7 K/uL   Basophils Relative 0 %   Basophils Absolute 0.0 0.0 - 0.1 K/uL  Comprehensive metabolic panel     Status: Abnormal   Collection Time: 01/19/17  5:20 AM  Result Value Ref Range   Sodium 134 (L) 135 - 145 mmol/L   Potassium 3.9 3.5 - 5.1 mmol/L   Chloride 103 101 - 111 mmol/L   CO2 23 22 - 32 mmol/L   Glucose, Bld 134 (H) 65 - 99 mg/dL   BUN 14 6 - 20 mg/dL   Creatinine, Ser 0.58 0.44 - 1.00 mg/dL   Calcium 8.7 (L) 8.9 - 10.3 mg/dL   Total Protein 6.2 (L) 6.5 - 8.1 g/dL   Albumin 2.7 (L) 3.5 - 5.0 g/dL   AST 21 15 - 41 U/L   ALT 23 14 - 54 U/L   Alkaline Phosphatase 74 38 - 126 U/L   Total Bilirubin 0.7 0.3 - 1.2 mg/dL   GFR calc non Af Amer >60 >60 mL/min   GFR calc Af Amer >60 >60 mL/min    Comment: (NOTE) The eGFR has been calculated using the CKD EPI equation. This calculation has not been validated in all clinical  situations. eGFR's persistently <60 mL/min signify possible Chronic Kidney Disease.    Anion gap 8 5 - 15  Glucose, capillary     Status: Abnormal   Collection Time: 01/19/17 12:06 PM  Result Value Ref Range   Glucose-Capillary 135 (H) 65 - 99 mg/dL   Comment 1 Notify RN   Glucose, capillary     Status: Abnormal   Collection Time: 01/19/17  5:28 PM  Result Value Ref Range   Glucose-Capillary 188 (H) 65 - 99 mg/dL  Glucose, capillary     Status: Abnormal   Collection Time: 01/19/17  9:15 PM  Result Value Ref Range   Glucose-Capillary 113 (H) 65 - 99 mg/dL     HEENT: normal Cardio: RRR and no murmur Resp: CTA B/L and unlabored GI: BS positive and NT, ND Extremity:  Pulses positive and No Edema Skin:   Intact Neuro: Alert/Oriented, Abnormal Sensory Reduced on Left and Abnormal Motor 2- L biceps, 0 left   delt, triceps, finger flex/ext, 2- hip/knee ext synergy Musc/Skel:  Other no pain with UE or LE ROM Gen NAD   Assessment/Plan: 1. Functional deficits secondary to RIght frontal metastasis breast CA primary which require 3+ hours per day of interdisciplinary therapy in a comprehensive inpatient rehab setting. Physiatrist is providing close team supervision and 24 hour management of active medical problems listed below. Physiatrist and rehab team continue to assess barriers to discharge/monitor patient progress toward functional and medical goals. FIM: Function - Bathing Position: Shower Body parts bathed by patient: Left arm, Chest, Abdomen, Front perineal area, Right upper leg, Left upper leg Body parts bathed by helper: Right lower leg, Left lower leg, Buttocks, Back Assist Level: Touching or steadying assistance(Pt > 75%)  Function- Upper Body Dressing/Undressing What is the patient wearing?: Pull over shirt/dress Pull over shirt/dress - Perfomed by patient: Thread/unthread right sleeve, Put head through opening Pull over shirt/dress - Perfomed by helper: Thread/unthread  left sleeve, Pull shirt over trunk Assist Level: Touching or steadying assistance(Pt > 75%) Function - Lower Body Dressing/Undressing What is the patient wearing?: Pants, Underwear, Non-skid slipper socks Position: Wheelchair/chair at sink Underwear - Performed by helper: Thread/unthread right underwear leg, Thread/unthread left underwear leg, Pull underwear up/down Pants- Performed by helper: Thread/unthread right pants leg, Thread/unthread left pants leg, Pull pants up/down Non-skid slipper socks- Performed by helper: Don/doff right sock, Don/doff left sock Assist for footwear: Dependant Assist for lower body dressing: Touching or steadying assistance (Pt > 75%)  Function - Toileting Toileting steps completed by helper: Adjust clothing prior to toileting, Performs perineal hygiene, Adjust clothing after toileting Toileting Assistive Devices: Other (comment) (stedy) Assist level: Touching or steadying assistance (Pt.75%)  Function - Toilet Transfers Toilet transfer assistive device: Elevated toilet seat/BSC over toilet Assist level to toilet: Maximal assist (Pt 25 - 49%/lift and lower) Assist level from toilet: Maximal assist (Pt 25 - 49%/lift and lower)  Function - Chair/bed transfer Chair/bed transfer method: Squat pivot Chair/bed transfer assist level: Maximal assist (Pt 25 - 49%/lift and lower) Chair/bed transfer assistive device: Armrests  Function - Locomotion: Wheelchair Will patient use wheelchair at discharge?: Yes Type: Manual Max wheelchair distance: 25' Assist Level: Maximal assistance (Pt 25 - 49%) Wheel 50 feet with 2 turns activity did not occur: Safety/medical concerns Wheel 150 feet activity did not occur: Safety/medical concerns Turns around,maneuvers to table,bed, and toilet,negotiates 3% grade,maneuvers on rugs and over doorsills: No Function - Locomotion: Ambulation Assistive device: No device Max distance: 15' Assist level: 2 helpers Assist level: 2  helpers Walk 50 feet with 2 turns activity did not occur: Safety/medical concerns Walk 150 feet activity did not occur: Safety/medical concerns Walk 10 feet on uneven surfaces activity did not occur: Safety/medical concerns  Function - Comprehension Comprehension: Auditory Comprehension assist level: Follows basic conversation/direction with no assist  Function - Expression Expression: Verbal Expression assist level: Expresses basic needs/ideas: With no assist  Function - Social Interaction Social Interaction assist level: Interacts appropriately with others - No medications needed.  Function - Problem Solving Problem solving assist level: Solves basic problems with no assist  Function - Memory Memory assist level: More than reasonable amount of time Patient normally able to recall (first 3 days only): Current season, Location of own room, Staff names and faces, That he or she is in a hospital  Medical Problem List and Plan: 1. 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 DVT Prophylaxis/Anticoagulation: Mechanical: Sequential compression devices, below   knee Bilateral lower extremities 3. Pain Management: tylenol prn 4. Mood: Stable on home dose Lexapro with ativan at nights to help with sleep/anxiety. Sister supportive and mood appears stable. LCSW to follow for evaluation and support.  5. Neuropsych: This patient iscapable of making decisions on herown behalf. 6. Skin/Wound Care: routine pressure relief measures. Maintain adequate nutritional and hydration status.  7. Fluids/Electrolytes/Nutrition: Monitor I/O. CMET nl 9/27 except low alb, po intake 360ml 8. 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  9. 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  10 GERD: due to steroids. Managed with PPI.  11. Right frontoparietal metastatic disease: Hemorrhage and increase in  edema treated with decadron 4 mg bid D# 4. Hem/Onc recommended decreasing to once a day at discharge--decrease to daily 9/27 12. HTN: Monitor BP bid. Continue Norvasc daily. Controlled 9/28 Vitals:   01/19/17 1551 01/20/17 0434  BP: 114/70 (!) 106/53  Pulse: 80 64  Resp: 18   Temp: 97.6 F (36.4 C) 98.3 F (36.8 C)  SpO2: 97% 98%   13. Constipation: Has not had BM since last Tuesday. Give two doses of miralax today. BM this am  LOS (Days) 2 A FACE TO FACE EVALUATION WAS PERFORMED  KIRSTEINS,ANDREW E 01/20/2017, 7:56 AM   

## 2017-01-20 NOTE — Progress Notes (Signed)
Patient with intermuscular thrombus--will recheck dopplers next week to monitor for stability. Patient/family informed.

## 2017-01-20 NOTE — Progress Notes (Signed)
Occupational Therapy Session Note  Patient Details  Name: Kelly Stuart MRN: [id number] Date of Birth: 14-Feb-1946  Today's Date: 01/20/2017  Session 1 OT Individual Time: 0800-0930 OT Individual Time Calculation (min): 90 min   OT Individual Time: 1435-1535 OT Individual Time Calculation (min): 60 min   Short Term Goals: Week 1:  OT Short Term Goal 1 (Week 1): Pt will recall hemi-dressing techniques 2 consecutive days with min questioning cues OT Short Term Goal 2 (Week 1): Pt will look past midline to left to locate 3/4 grooming items on L side of sink OT Short Term Goal 3 (Week 1): Pt will maintian midline orientation in sitting with MIN A  Skilled Therapeutic Interventions/Progress Updates:    Session 1 Treatment session focused on body awareness, L side attention, functional use of L UE, and improved sit<>stand within modified bathing/dressing tasks. Addressed sitting balance at EOB using NDT techniques to achieve pelvic and trunk alignment. OT facilitated weight bearing through L knee to decrease extensor tone, while completing squat-pivot to R with Max A. Stedy used to transfer pt into shower for safety with Max A sit<>stand. Worked on postural control seated on tub bench. Pt able to maintain sitting balance with supervision for short periods if holding on to R railing, otherwise, she drifts to the L and has no awareness to fix It unless you cue her. Facilitated weight bearing through R UE while washing with hand-over hand A. Educated pt on hemi-techniques for dressing, beginning with UB dressing. Pt needed assistance to orient shirt and multiple trials to initiate and problem solve. LB dressing completed with OT positioning LLE into figure 4 position and Mod A for sitting balance to then get R leg into pant leg. Pt is impulsive at times and requires cues to slow down. Sit<>stand at the sink with Max A + facilitation at L knee. Once standing, pt able to assist with pulling up pants while OT  provided max A for standing balance. Addressed L attention using visual scanning activity to locate grooming items on the sink. Pt needed min instructional cues to scan L and locate 2/4 items.   Session 2 OT treatment session focused on bed level LB dressing, transfers, L NMR, L NMES, and standing posture. Educated pt on adapted strategies to don pants in bed. Pt needed assistance to achieve figure 4 position in supine with LLE, but then was able to thread pant leg. OT provided LLE support and pt able to fully bridge bottom off of bed to pull up pants. Squat-pivot transfer bed>WC with Max A and facilitation at L knee to force weight bearing 2/2 L extensor synergy. Pt then brought to day room and NMES applied to supraspinatus and deltoid to help approximate shoulder joint, then also to wrist extensors. Pt came to standing with e-stim on and worked on standing posture and scanning to midline. NDT techniques to facilitate hip and trunk extension. Pt returned to room and left seated in wc with needs met and call bell in hand.  Therapy Documentation Precautions:  Precautions Precautions: Fall, Other (comment) Precaution Comments: decreased attention to left; CA; h/o seizures Restrictions Weight Bearing Restrictions: No Pain:  none/denies pain ADL: ADL ADL Comments: Please see functional navigator  See Function Navigator for Current Functional Status.   Therapy/Group: Individual Therapy  Valma Cava 01/20/2017, 3:38 PM

## 2017-01-21 ENCOUNTER — Inpatient Hospital Stay (HOSPITAL_COMMUNITY): Payer: Medicare Other | Admitting: Occupational Therapy

## 2017-01-21 ENCOUNTER — Inpatient Hospital Stay (HOSPITAL_COMMUNITY): Payer: Medicare Other | Admitting: Physical Therapy

## 2017-01-21 ENCOUNTER — Inpatient Hospital Stay (HOSPITAL_COMMUNITY): Payer: Medicare Other | Admitting: *Deleted

## 2017-01-21 NOTE — IPOC Note (Signed)
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[place])     Functional Problem List: Nursing Sensory, Nutrition, Behavior, Pain, Endurance, Medication Management, Motor, Bowel, Safety  PT Balance, Behavior, Endurance, Motor, Pain, Perception, Safety, Sensory, Skin Integrity  OT Balance, Cognition, Endurance, Motor, Perception, Safety, Sensory, Vision  SLP    TR         Basic ADL's: OT Eating, Grooming, Bathing, Dressing, Toileting     Advanced  ADL's: OT       Transfers: PT Bed to Chair, Bed Mobility, Car, Patent attorney, Agricultural engineer: PT Ambulation, Stairs, Emergency planning/management officer     Additional Impairments: OT Fuctional Use of Upper Extremity  SLP        TR      Anticipated Outcomes Item Anticipated Outcome  Self Feeding supervision  Swallowing      Basic self-care  Supervision  Toileting  Supervision   Bathroom Transfers Min A  Bowel/Bladder  Pt will manage bowel and bladder with min assist at discharge   Transfers  min assist  Locomotion  supervision w/c mobility; min assist household gait  Communication     Cognition     Pain  Pt will manage pain at 4 or less on a scale of 0-10.   Safety/Judgment  Pt will remain free of skin breakdown and infection with min assist    Therapy Plan: PT Intensity: Minimum of 1-2 x/day ,45 to 90 minutes PT Frequency: 5 out of 7 days PT Duration Estimated Length of Stay: 17-21 days OT Intensity: Minimum of 1-2 x/day, 45 to 90 minutes OT Frequency: 5 out of 7 days OT Duration/Estimated Length of Stay: 3 WEEKS      Team Interventions: Nursing Interventions Patient/Family Education, Bowel Management, Pain Management, Disease  Management/Prevention, Medication Management, Skin Care/Wound Management, Psychosocial Support, Discharge Planning  PT interventions Ambulation/gait training, Balance/vestibular training, Cognitive remediation/compensation, Community reintegration, Discharge planning, Disease management/prevention, DME/adaptive equipment instruction, Functional mobility training, Neuromuscular re-education, Pain management, Patient/family education, Psychosocial support, Skin care/wound management, Splinting/orthotics, Stair training, Therapeutic Activities, Therapeutic Exercise, UE/LE Strength taining/ROM, UE/LE Coordination activities, Visual/perceptual remediation/compensation, Wheelchair propulsion/positioning  OT Interventions Training and development officer, Cognitive remediation/compensation, Community reintegration, Discharge planning, DME/adaptive equipment instruction, Functional electrical stimulation, Functional mobility training, Neuromuscular re-education, Patient/family education, Psychosocial support, Self Care/advanced ADL retraining, Therapeutic Activities, Splinting/orthotics, Therapeutic Exercise, UE/LE Coordination activities, UE/LE Strength taining/ROM, Visual/perceptual remediation/compensation, Wheelchair propulsion/positioning  SLP Interventions    TR Interventions    SW/CM Interventions Discharge Planning, Psychosocial Support, Patient/Family Education   Barriers to Discharge MD  Medical stability  Nursing      PT Pending chemo/radiation, Inaccessible home environment will have to negotiate stairs for home entry  OT      SLP      SW       Team Discharge Planning: Destination: PT-Home ,OT- Home , SLP-  Projected Follow-up: PT-Home health PT, 24 hour supervision/assistance, OT-  Home health OT, SLP-  Projected Equipment Needs: PT-To be determined, OT- To be determined, SLP-  Equipment Details: PT- , OT-  Patient/family involved in discharge planning: PT- Patient,  OT-Patient, SLP-   MD  ELOS: 18-20 days Medical Rehab Prognosis:  Good Assessment: The patient has been admitted for CIR therapies with the diagnosis of  metastatic cancer to brain with subsequent functional deficits. The team will be addressing functional mobility, strength, stamina, balance, safety, adaptive techniques and equipment, self-care, bowel and bladder mgt, patient and caregiver education, NMR, vestibular assessment, w/c set up, community reentry. Goals have been set at supervision to occasional min assist with basic mobility and self-care tasks.    Meredith Staggers, MD, FAAPMR      See Team Conference Notes for weekly updates to the plan of care

## 2017-01-21 NOTE — Progress Notes (Signed)
Occupational Therapy Session Note  Patient Details  Name: Kelly Stuart MRN: [id number] Date of Birth: [birth year]  Today's Date: 01/21/2017 OT Individual Time: 1330-1415 OT Individual Time Calculation (min): 45 min    Short Term Goals: Week 1:  OT Short Term Goal 1 (Week 1): Pt will recall hemi-dressing techniques 2 consecutive days with min questioning cues OT Short Term Goal 2 (Week 1): Pt will look past midline to left to locate 3/4 grooming items on L side of sink OT Short Term Goal 3 (Week 1): Pt will maintian midline orientation in sitting with MIN A  Skilled Therapeutic Interventions/Progress Updates:    1:1 NMR with focus on activation of left UE with visual attention with movement to promote more normal patterns of movement as well as initiation of movement. Pt noted with some wrist flexion with supination but difficulty with pronation without facilitation. Focus on squat pivot transfers slowly and controlled with multiple scoots instead of big gross movements that are uncontrolled. Transitional movements supine <>sit, bridging, rolling and sidying lying.   Improved transfers by the end with VC to be more cognizant of slow/ controlled movement- min A transfers in 3 squats.  Therapy Documentation Precautions:  Precautions Precautions: Fall, Other (comment) Precaution Comments: decreased attention to left; CA; h/o seizures Restrictions Weight Bearing Restrictions: No Pain:  no c/o pain  ADL: ADL ADL Comments: Please see functional navigator  See Function Navigator for Current Functional Status.   Therapy/Group: Individual Therapy  Willeen Cass [facility] 01/21/2017, 2:43 PM

## 2017-01-21 NOTE — Progress Notes (Signed)
Subjective/Complaints: Feeling well. Good night sleep.   ROS: pt denies nausea, vomiting, diarrhea, cough, shortness of breath or chest pain   Objective: Vital Signs: Blood pressure (!) 98/50, pulse 82, temperature 98 F (36.7 C), temperature source Oral, resp. rate 20, last menstrual period 08/23/2008, SpO2 98 %. No results found. Results for orders placed or performed during the hospital encounter of 01/18/17 (from the past 72 hour(s))  CBC WITH DIFFERENTIAL     Status: Abnormal   Collection Time: 01/19/17  5:20 AM  Result Value Ref Range   WBC 6.3 4.0 - 10.5 K/uL   RBC 3.84 (L) 3.87 - 5.11 MIL/uL   Hemoglobin 10.8 (L) 12.0 - 15.0 g/dL   HCT 33.9 (L) 36.0 - 46.0 %   MCV 88.3 78.0 - 100.0 fL   MCH 28.1 26.0 - 34.0 pg   MCHC 31.9 30.0 - 36.0 g/dL   RDW 15.0 11.5 - 15.5 %   Platelets 165 150 - 400 K/uL   Neutrophils Relative % 82 %   Neutro Abs 5.2 1.7 - 7.7 K/uL   Lymphocytes Relative 9 %   Lymphs Abs 0.6 (L) 0.7 - 4.0 K/uL   Monocytes Relative 9 %   Monocytes Absolute 0.5 0.1 - 1.0 K/uL   Eosinophils Relative 0 %   Eosinophils Absolute 0.0 0.0 - 0.7 K/uL   Basophils Relative 0 %   Basophils Absolute 0.0 0.0 - 0.1 K/uL  Comprehensive metabolic panel     Status: Abnormal   Collection Time: 01/19/17  5:20 AM  Result Value Ref Range   Sodium 134 (L) 135 - 145 mmol/L   Potassium 3.9 3.5 - 5.1 mmol/L   Chloride 103 101 - 111 mmol/L   CO2 23 22 - 32 mmol/L   Glucose, Bld 134 (H) 65 - 99 mg/dL   BUN 14 6 - 20 mg/dL   Creatinine, Ser 0.58 0.44 - 1.00 mg/dL   Calcium 8.7 (L) 8.9 - 10.3 mg/dL   Total Protein 6.2 (L) 6.5 - 8.1 g/dL   Albumin 2.7 (L) 3.5 - 5.0 g/dL   AST 21 15 - 41 U/L   ALT 23 14 - 54 U/L   Alkaline Phosphatase 74 38 - 126 U/L   Total Bilirubin 0.7 0.3 - 1.2 mg/dL   GFR calc non Af Amer >60 >60 mL/min   GFR calc Af Amer >60 >60 mL/min    Comment: (NOTE) The eGFR has been calculated using the CKD EPI equation. This calculation has not been validated in all  clinical situations. eGFR's persistently <60 mL/min signify possible Chronic Kidney Disease.    Anion gap 8 5 - 15  Glucose, capillary     Status: Abnormal   Collection Time: 01/19/17 12:06 PM  Result Value Ref Range   Glucose-Capillary 135 (H) 65 - 99 mg/dL   Comment 1 Notify RN   Glucose, capillary     Status: Abnormal   Collection Time: 01/19/17  5:28 PM  Result Value Ref Range   Glucose-Capillary 188 (H) 65 - 99 mg/dL  Glucose, capillary     Status: Abnormal   Collection Time: 01/19/17  9:15 PM  Result Value Ref Range   Glucose-Capillary 113 (H) 65 - 99 mg/dL  Glucose, capillary     Status: Abnormal   Collection Time: 01/20/17 12:15 PM  Result Value Ref Range   Glucose-Capillary 109 (H) 65 - 99 mg/dL   Comment 1 Notify RN   Glucose, capillary     Status: Abnormal  Collection Time: 01/20/17  4:27 PM  Result Value Ref Range   Glucose-Capillary 106 (H) 65 - 99 mg/dL   Comment 1 Notify RN   Glucose, capillary     Status: Abnormal   Collection Time: 01/20/17  8:32 PM  Result Value Ref Range   Glucose-Capillary 113 (H) 65 - 99 mg/dL     HEENT: normal Cardio: RRR without murmur. No JVD  Resp: CTA Bilaterally without wheezes or rales. Normal effort  GI: BS positive and NT, ND Extremity:  Pulses positive and No Edema Skin:   Intact Neuro: Alert/Oriented, Abnormal Sensory Reduced on Left and Abnormal Motor 2- L biceps, 0 left delt, triceps, finger flex/ext, 2- hip/knee ext synergy Musc/Skel:  Other no pain with UE or LE ROM Gen NAD   Assessment/Plan: 1. Functional deficits secondary to RIght frontal metastasis breast CA primary which require 3+ hours per day of interdisciplinary therapy in a comprehensive inpatient rehab setting. Physiatrist is providing close team supervision and 24 hour management of active medical problems listed below. Physiatrist and rehab team continue to assess barriers to discharge/monitor patient progress toward functional and medical  goals. FIM: Function - Bathing Position: Shower Body parts bathed by patient: Left arm, Chest, Abdomen, Front perineal area, Right upper leg, Left upper leg Body parts bathed by helper: Left lower leg, Right lower leg, Right arm, Buttocks Assist Level: Touching or steadying assistance(Pt > 75%) (Max A)  Function- Upper Body Dressing/Undressing What is the patient wearing?: Pull over shirt/dress Pull over shirt/dress - Perfomed by patient: Put head through opening Pull over shirt/dress - Perfomed by helper: Thread/unthread right sleeve, Thread/unthread left sleeve, Pull shirt over trunk Assist Level: Touching or steadying assistance(Pt > 75%) Function - Lower Body Dressing/Undressing What is the patient wearing?: Pants, Shoes, [employer], Underwear Position: Education officer, museum at [employer] - Performed by patient: Thread/unthread right underwear leg Underwear - Performed by helper: Thread/unthread left underwear leg, Pull underwear up/down Pants- Performed by patient: Thread/unthread right pants leg Pants- Performed by helper: Thread/unthread left pants leg, Pull pants up/down Non-skid slipper socks- Performed by helper: Don/doff right sock, Don/doff left sock Shoes - Performed by helper: Don/doff right shoe, Don/doff left shoe, Fasten right, Fasten left TED Hose - Performed by helper: Don/doff left TED hose, Don/doff right TED hose Assist for footwear: Dependant Assist for lower body dressing: Touching or steadying assistance (Pt > 75%) (Max A)  Function - Toileting Toileting steps completed by helper: Adjust clothing prior to toileting, Performs perineal hygiene, Adjust clothing after toileting Toileting Assistive Devices: Other (comment) (stedy) Assist level: Touching or steadying assistance (Pt.75%)  Function - Toilet Transfers Toilet transfer assistive device: Elevated toilet seat/BSC over toilet Assist level to toilet: Maximal assist (Pt 25 - 49%/lift and lower) Assist level  from toilet: Maximal assist (Pt 25 - 49%/lift and lower)  Function - Chair/bed transfer Chair/bed transfer method: Squat pivot Chair/bed transfer assist level: Maximal assist (Pt 25 - 49%/lift and lower) Chair/bed transfer assistive device: Armrests  Function - Locomotion: Wheelchair Will patient use wheelchair at discharge?: Yes Type: Manual Max wheelchair distance: 100' Assist Level: Maximal assistance (Pt 25 - 49%) Wheel 50 feet with 2 turns activity did not occur: Safety/medical concerns Wheel 150 feet activity did not occur: Safety/medical concerns Turns around,maneuvers to table,bed, and toilet,negotiates 3% grade,maneuvers on rugs and over doorsills: No Function - Locomotion: Ambulation Assistive device: No device Max distance: 12' Assist level: 2 helpers Assist level: 2 helpers Walk 50 feet with 2 turns activity did not  occur: Safety/medical concerns Walk 150 feet activity did not occur: Safety/medical concerns Walk 10 feet on uneven surfaces activity did not occur: Safety/medical concerns  Function - Comprehension Comprehension: Auditory Comprehension assist level: Follows basic conversation/direction with no assist  Function - Expression Expression: Verbal Expression assist level: Expresses basic needs/ideas: With no assist  Function - Social Interaction Social Interaction assist level: Interacts appropriately with others - No medications needed.  Function - Problem Solving Problem solving assist level: Solves basic problems with no assist  Function - Memory Memory assist level: More than reasonable amount of time Patient normally able to recall (first 3 days only): Current season, Location of own room, Staff names and faces, That he or she is in a hospital  Medical Problem List and Plan: 1. 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 DVT Prophylaxis/Anticoagulation: Mechanical: Sequential compression devices,  below knee Bilateral lower extremities 3. Pain Management: tylenol prn 4. Mood: Stable on home dose Lexapro with ativan at nights to help with sleep/anxiety. Sister supportive and mood appears stable. LCSW to follow for evaluation and support.  5. Neuropsych: This patient iscapable of making decisions on herown behalf. 6. Skin/Wound Care: routine pressure relief measures. Maintain adequate nutritional and hydration status.  7. Fluids/Electrolytes/Nutrition: Monitor I/O. CMET nl 9/27 except low alb, po intake 326m  -continue to push po 8. 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  9. 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  10 GERD: due to steroids. Managed with PPI.  11. Right frontoparietal metastatic disease: Hemorrhage and increase in edema treated with decadron    - Hem/Onc recommended decreasing to once a day at discharge  --decreased to daily 9/27 12. HTN: Monitor BP bid. Continue Norvasc daily. Controlled 9/28 Vitals:   01/20/17 1445 01/21/17 0626  BP: (!) 97/56 (!) 98/50  Pulse: 84 82  Resp: 20 20  Temp: 97.8 F (36.6 C) 98 F (36.7 C)  SpO2: 98% 98%   13. Constipation: H . BM this 9 28  LOS (Days) 3 A FACE TO FACE EVALUATION WAS PERFORMED  SWARTZ,ZACHARY T 01/21/2017, 8:37 AM

## 2017-01-21 NOTE — Progress Notes (Signed)
Physical Therapy Session Note  Patient Details  Name: Kelly Stuart MRN: [id number] Date of Birth: [birth year]  Today's Date: 01/21/2017 PT Individual Time: 1000-1100 and 1500-1530 PT Individual Time Calculation (min): 60 min and 30 min  Short Term Goals: Week 1:  PT Short Term Goal 1 (Week 1): Pt will be able to perform functional transfer with mod assist PT Short Term Goal 2 (Week 1): Pt will be able to propel w/c with hemi-technique with min assist PT Short Term Goal 3 (Week 1): Pt will be able to gait x 30' with max assist of 1 (w/c follow for safety) PT Short Term Goal 4 (Week 1): Pt will be able to manage LUE during transfers with mod verbal cues  Skilled Therapeutic Interventions/Progress Updates: Tx1: Pt presented in w/c slumped over sleeping but easily aroused. Repositioned to more neutral positioned and transferred to rehab gym. Donned sneakers and performed sit to stand with Stedy L foot/ankle stabilized due to significant rolling. Due to this session focused on sitting balance and achieving/maintaining midline. Performed reaching, crossing/midline activities including stacking cups and placing horse shoes on basketball hoop. Pt required modA when reaching to L for control but min A for returning to midline. Pt frequently returning to a sacral sit in neutral with min verbal cues for correction. Use of mirror for visual feedback with improved results. Performed squat pivot transfer to return to w/c modA with max cues for sequencing and safety to decrease speed of pt's actions. Performed w/c mobility using hemi technique x 64f with modA . Pt with difficulty performing UE/LE simultaneously notably alternating  UE then LE requiring cues for L attention/drifting. Upon leaving room pt request to sit in recliner vs w/c. Use of Stedy for time management and pt left in recliner with call bell within reach and needs met.   Tx2: Pt presented in recliner with sister SManuela Schwartzpresent and agreeable to  therapy. Performed squat pivot transfer maxA to L with cues for hand placement and safety with transfer (to slow down movements). Pt propelled x 2040fwith min/modA with multimodal cues for RLE placement as pt places foot wide of w/c with increased varus angle. Per pt request performed x 3 trials on Dynavision mode B with targeted area zones 1-3 in R upper and lower quadrant. Pt with 2/6, 6/8, and 1/5 in upper L quadrant and 2/3, 3/8, and 7/11 in lower L quadrant. Pt transported back to room and performed squat pivot to R maxA to recliner. Pt left with needs met and sister present.       Therapy Documentation Precautions:  Precautions Precautions: Fall, Other (comment) Precaution Comments: decreased attention to left; CA; h/o seizures Restrictions Weight Bearing Restrictions: No   See Function Navigator for Current Functional Status.   Therapy/Group: Individual Therapy  Kaikoa Magro  Kyle Luppino, PTA  01/21/2017, 12:41 PM

## 2017-01-21 NOTE — Progress Notes (Signed)
Occupational Therapy Session Note  Patient Details  Name: Kelly Stuart MRN: [id number] Date of Birth: 03-17-1946  Today's Date: 01/21/2017 OT Individual Time: 3149-7026 OT Individual Time Calculation (min): 63 min   Short Term Goals: Week 1:  OT Short Term Goal 1 (Week 1): Pt will recall hemi-dressing techniques 2 consecutive days with min questioning cues OT Short Term Goal 2 (Week 1): Pt will look past midline to left to locate 3/4 grooming items on L side of sink OT Short Term Goal 3 (Week 1): Pt will maintian midline orientation in sitting with MIN A      Skilled Therapeutic Interventions/Progress Updates:    Tx focus on functional transfers, postural awareness, Lt NMR, Lt attention, balance, and adaptive dressing techniques.   Pt greeted supine in bed, requesting to change clothes. Declining bathing. Pt completed squat pivot transfer to Lt with extra time for sequencing and Max A. Pt with strong extensor tone and required manual facilitation for forward weight shifting and L LE weightbearing. Once in w/c, pt completed face washing and grooming tasks, scanning Lt/Rt with cues, L UE facilitated as gross stabilizer, also facilitated weightbearing through forearm for NMR. Pt completed UB/LB dressing with step by step instruction on hemi techniques. With extra time and instruction, pt able to elevate L LE (at foot) into figure 4 position to thread pants and don gripper sock! Pt requiring mod-max questioning cues to recognize Lt lean and maintain midline throughout session. Heavy use of mirror for visual biofeedback to increase awareness. Pt standing with Max A and L LE blocked for perihygiene and lifting pants over hips. Had pt focus on standing posture while OT assisted with LB dressing tasks at this time. Using visual landmarks appeared to help her improve postural alignment. Afterwards pt returned to w/c. She was left with half lap tray, safety belt, and all needs within reach at time of  departure.   Pt teary near end of tx, concerned with her current level of function. Discussed pts progress, provided encouragement and emotional support to improve psychosocial wellbeing and feelings of self efficacy.   Therapy Documentation Precautions:  Precautions Precautions: Fall, Other (comment) Precaution Comments: decreased attention to left; CA; h/o seizures Restrictions Weight Bearing Restrictions: No Pain: No c/o pain during tx    ADL: ADL ADL Comments: Please see functional navigator     See Function Navigator for Current Functional Status.   Therapy/Group: Individual Therapy  Luvern Mischke A Piera Downs 01/21/2017, 12:18 PM

## 2017-01-22 ENCOUNTER — Inpatient Hospital Stay (HOSPITAL_COMMUNITY): Payer: Medicare Other

## 2017-01-22 LAB — GLUCOSE, CAPILLARY
GLUCOSE-CAPILLARY: 105 mg/dL — AB (ref 65–99)
GLUCOSE-CAPILLARY: 146 mg/dL — AB (ref 65–99)

## 2017-01-22 NOTE — Progress Notes (Signed)
Occupational Therapy Session Note  Patient Details  Name: Kelly Stuart MRN: [id number] Date of Birth: Oct 08, 1945  Today's Date: 01/22/2017 OT Individual Time: 1300-1400 OT Individual Time Calculation (min): 60 min    Short Term Goals: Week 1:  OT Short Term Goal 1 (Week 1): Pt will recall hemi-dressing techniques 2 consecutive days with min questioning cues OT Short Term Goal 2 (Week 1): Pt will look past midline to left to locate 3/4 grooming items on L side of sink OT Short Term Goal 3 (Week 1): Pt will maintian midline orientation in sitting with MIN A  Skilled Therapeutic Interventions/Progress Updates:    1:1. Focus os session on squat pivot trnasfers to R, sitting balance, posture, core exercise and NMR during favorite leisure activity of yoga. Pt squat pivot transfer w/c<>EOM to R with VC for safety awareness, "slowing down" and head/hips relationship to break up extensor pattern. With LUE weight bearing into mat, Pt reaches with RUE to play connect four, crossing midline to place piece mod ranges outside BOS on L. Pt requires tactile, verbal and visual cues in mirror for feedback to adjust pelvic tilt, lateral lean to L and L trunk elongation. Pt engages in series of seated yoga poses to facilitate trunk rotation, self ROM, weight shifting, core engagement, weight bearing through LUE, and L attention with tactile cues and visual feedback in mirror for proper sitting/posture. Pt states, "I really enjoyed doing something I used to love to do on my own." Exited session with pt seated in recliner with call light in reach and QRB donned.   Therapy Documentation Precautions:  Precautions Precautions: Fall, Other (comment) Precaution Comments: decreased attention to left; CA; h/o seizures Restrictions Weight Bearing Restrictions: No  See Function Navigator for Current Functional Status.   Therapy/Group: Individual Therapy  Tonny Branch 01/22/2017, 2:00 PM

## 2017-01-22 NOTE — Progress Notes (Signed)
Subjective/Complaints: Had a question about SCD's. Doesn't feel left device is "pumping" as much as the right. kpad for low back pain very helpful.  ROS: pt denies nausea, vomiting, diarrhea, cough, shortness of breath or chest pain   Objective: Vital Signs: Blood pressure (!) 100/48, pulse 75, temperature 98.2 F (36.8 C), temperature source Oral, resp. rate 18, height 5\' 10"  (1.778 m), last menstrual period 08/23/2008, SpO2 94 %. No results found. Results for orders placed or performed during the hospital encounter of 01/18/17 (from the past 72 hour(s))  Glucose, capillary     Status: Abnormal   Collection Time: 01/19/17 12:06 PM  Result Value Ref Range   Glucose-Capillary 135 (H) 65 - 99 mg/dL   Comment 1 Notify RN   Glucose, capillary     Status: Abnormal   Collection Time: 01/19/17  5:28 PM  Result Value Ref Range   Glucose-Capillary 188 (H) 65 - 99 mg/dL  Glucose, capillary     Status: Abnormal   Collection Time: 01/19/17  9:15 PM  Result Value Ref Range   Glucose-Capillary 113 (H) 65 - 99 mg/dL  Glucose, capillary     Status: Abnormal   Collection Time: 01/20/17 12:15 PM  Result Value Ref Range   Glucose-Capillary 109 (H) 65 - 99 mg/dL   Comment 1 Notify RN   Glucose, capillary     Status: Abnormal   Collection Time: 01/20/17  4:27 PM  Result Value Ref Range   Glucose-Capillary 106 (H) 65 - 99 mg/dL   Comment 1 Notify RN   Glucose, capillary     Status: Abnormal   Collection Time: 01/20/17  8:32 PM  Result Value Ref Range   Glucose-Capillary 113 (H) 65 - 99 mg/dL  Glucose, capillary     Status: Abnormal   Collection Time: 01/22/17  6:25 AM  Result Value Ref Range   Glucose-Capillary 105 (H) 65 - 99 mg/dL     HEENT: normal Cardio: RRR without murmur. No JVD   Resp: CTA Bilaterally without wheezes or rales. Normal effort   GI: BS positive and NT, ND Extremity:  Pulses positive and No Edema Skin:   Intact Neuro: Alert/Oriented, Abnormal Sensory Reduced on Left  and Abnormal Motor 2- L biceps, 0 left delt, triceps, finger flex/ext, 2- hip/knee ext synergy Musc/Skel:  Other no pain with UE or LE ROM. No LBP currently Gen NAD   Assessment/Plan: 1. Functional deficits secondary to RIght frontal metastasis breast CA primary which require 3+ hours per day of interdisciplinary therapy in a comprehensive inpatient rehab setting. Physiatrist is providing close team supervision and 24 hour management of active medical problems listed below. Physiatrist and rehab team continue to assess barriers to discharge/monitor patient progress toward functional and medical goals. FIM: Function - Bathing Position: Shower Body parts bathed by patient: Left arm, Chest, Abdomen, Front perineal area, Right upper leg, Left upper leg Body parts bathed by helper: Left lower leg, Right lower leg, Right arm, Buttocks Assist Level: Touching or steadying assistance(Pt > 75%) (Max A)  Function- Upper Body Dressing/Undressing What is the patient wearing?: Pull over shirt/dress Pull over shirt/dress - Perfomed by patient: Thread/unthread right sleeve, Put head through opening Pull over shirt/dress - Perfomed by helper: Pull shirt over trunk, Thread/unthread left sleeve Assist Level: Touching or steadying assistance(Pt > 75%) Function - Lower Body Dressing/Undressing What is the patient wearing?: Pants, Non-skid slipper socks Position: Wheelchair/chair at sink Underwear - Performed by patient: Thread/unthread right underwear leg Underwear - Performed by helper:  Thread/unthread left underwear leg, Pull underwear up/down Pants- Performed by patient: Thread/unthread right pants leg, Thread/unthread left pants leg Pants- Performed by helper: Pull pants up/down Non-skid slipper socks- Performed by patient: Don/doff right sock, Don/doff left sock Non-skid slipper socks- Performed by helper: Don/doff right sock, Don/doff left sock Shoes - Performed by helper: Don/doff right shoe,  Don/doff left shoe, Fasten right, Fasten left TED Hose - Performed by helper: Don/doff left TED hose, Don/doff right TED hose Assist for footwear: Supervision/touching assist Assist for lower body dressing: Touching or steadying assistance (Pt > 75%) (Max A)  Function - Toileting Toileting steps completed by helper: Adjust clothing prior to toileting, Performs perineal hygiene, Adjust clothing after toileting Toileting Assistive Devices: Other (comment) (stedy) Assist level: Touching or steadying assistance (Pt.75%)  Function - Toilet Transfers Toilet transfer assistive device: Elevated toilet seat/BSC over toilet Assist level to toilet: Maximal assist (Pt 25 - 49%/lift and lower) Assist level from toilet: Maximal assist (Pt 25 - 49%/lift and lower)  Function - Chair/bed transfer Chair/bed transfer method: Squat pivot Chair/bed transfer assist level: Maximal assist (Pt 25 - 49%/lift and lower) Chair/bed transfer assistive device: Armrests  Function - Locomotion: Wheelchair Will patient use wheelchair at discharge?: Yes Type: Manual Max wheelchair distance: 100' Assist Level: Maximal assistance (Pt 25 - 49%) Wheel 50 feet with 2 turns activity did not occur: Safety/medical concerns Wheel 150 feet activity did not occur: Safety/medical concerns Turns around,maneuvers to table,bed, and toilet,negotiates 3% grade,maneuvers on rugs and over doorsills: No Function - Locomotion: Ambulation Assistive device: No device Max distance: 12' Assist level: 2 helpers Assist level: 2 helpers Walk 50 feet with 2 turns activity did not occur: Safety/medical concerns Walk 150 feet activity did not occur: Safety/medical concerns Walk 10 feet on uneven surfaces activity did not occur: Safety/medical concerns  Function - Comprehension Comprehension: Auditory Comprehension assist level: Follows basic conversation/direction with no assist  Function - Expression Expression: Verbal Expression assist  level: Expresses basic needs/ideas: With no assist  Function - Social Interaction Social Interaction assist level: Interacts appropriately with others - No medications needed.  Function - Problem Solving Problem solving assist level: Solves basic problems with no assist  Function - Memory Memory assist level: More than reasonable amount of time Patient normally able to recall (first 3 days only): Current season, Location of own room, Staff names and faces, That he or she is in a hospital  Medical Problem List and Plan: 1. 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 DVT Prophylaxis/Anticoagulation: Mechanical: Sequential compression devices, below knee Bilateral lower extremities  -adjusted left SCD (tightened wrap). If still not giving same effect, we can change out 3. Pain Management: tylenol prn + kpad 4. Mood: Stable on home dose Lexapro with ativan at nights to help with sleep/anxiety. Sister supportive and mood appears stable. LCSW to follow for evaluation and support.  5. Neuropsych: This patient iscapable of making decisions on herown behalf. 6. Skin/Wound Care: routine pressure relief measures. Maintain adequate nutritional and hydration status.  7. Fluids/Electrolytes/Nutrition: Monitor I/O. CMET nl 9/27 except low alb, po intake 340ml  -continue to encourage po 8. 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  9. 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  10 GERD: due to steroids. Managed with PPI.  11. Right frontoparietal metastatic disease: Hemorrhage and increase in edema treated with decadron    - Hem/Onc recommended  decreasing to once a day at discharge  --decreased to daily 9/27 12. HTN: Monitor BP bid. Continue Norvasc daily. Controlled 9/28--avoid over treating Vitals:   01/21/17 1444 01/22/17 0518  BP: 103/68 (!) 100/48  Pulse: (!) 103 75  Resp:  20 18  Temp: 98.4 F (36.9 C) 98.2 F (36.8 C)  SpO2: 94% 94%   13. Constipation: H . BM  9/28  LOS (Days) 4 A FACE TO FACE EVALUATION WAS PERFORMED  SWARTZ,ZACHARY T 01/22/2017, 8:05 AM

## 2017-01-23 ENCOUNTER — Inpatient Hospital Stay (HOSPITAL_COMMUNITY): Payer: Medicare Other | Admitting: Physical Therapy

## 2017-01-23 ENCOUNTER — Other Ambulatory Visit: Payer: Medicare Other

## 2017-01-23 ENCOUNTER — Inpatient Hospital Stay (HOSPITAL_COMMUNITY): Payer: Medicare Other | Admitting: Occupational Therapy

## 2017-01-23 ENCOUNTER — Ambulatory Visit: Payer: Medicare Other

## 2017-01-23 ENCOUNTER — Ambulatory Visit: Payer: Medicare Other | Admitting: Hematology and Oncology

## 2017-01-23 DIAGNOSIS — M6289 Other specified disorders of muscle: Secondary | ICD-10-CM

## 2017-01-23 DIAGNOSIS — R270 Ataxia, unspecified: Secondary | ICD-10-CM

## 2017-01-23 LAB — GLUCOSE, CAPILLARY: Glucose-Capillary: 106 mg/dL — ABNORMAL HIGH (ref 65–99)

## 2017-01-23 MED ORDER — AMLODIPINE BESYLATE 2.5 MG PO TABS
2.5000 mg | ORAL_TABLET | Freq: Every day | ORAL | Status: DC
  Administered 2017-01-23: 2.5 mg via ORAL
  Filled 2017-01-23: qty 1

## 2017-01-23 NOTE — Progress Notes (Signed)
Occupational Therapy Session Note  Patient Details  Name: KEYON WINNICK MRN: [id number] Date of Birth: [birth year]  Today's Date: 01/23/2017   Session 1 OT Individual Time: 0803-0900 OT Individual Time Calculation (min): 57 min   Session 2 OT Individual Time: 3868-5488 OT Individual Time Calculation (min): 57 min    Short Term Goals: Week 1:  OT Short Term Goal 1 (Week 1): Pt will recall hemi-dressing techniques 2 consecutive days with min questioning cues OT Short Term Goal 2 (Week 1): Pt will look past midline to left to locate 3/4 grooming items on L side of sink OT Short Term Goal 3 (Week 1): Pt will maintian midline orientation in sitting with MIN A  Skilled Therapeutic Interventions/Progress Updates:  Session 1   Treatment session focused on sitting balance, trunk control, L side attention, and functional use of L UE. Pt transferred to sitting EOB and utilized NDT techniques to facilitate trunk and pelvic positioning to midline. Discussed importance of head/hips relationship with pt verbalizing understanding. Stedy used to transfer pt to tub bench with Mod A sit<>stand. Improved sitting posture on Stedy. Provided pt with long handled sponge and wash mit for bathing. Hand over hand A to achieve L UE pronation to promote weight bearing through L UE while bathing. Dressing completed sit<>stand with Max A LB dressing and Mod A UB dressing 2/2 time constraints. Pt left seated in wc at end of session with lap tray on and needs met.   Session 2 OT treatment session focused on improved sit<>stand, L NMR, and NMES. Pt transferred recliner to wc on L side with Max A 2/2 extensor patterns making transfer difficulty. Pt brought to day room to practice maintaining anterior weight shift with sit<>stand using standing frame. Incorporated e-stim to L wrist extensors and promoted weight bearing through L elbow in standing. Utilized point of focus on the wall to maintain midline gaze. Provided pt with  elbow pad protector as her other one was lost. Pt then returned to room and transferred back to recliner on R side with Mod A, improved anterior weight shift and facilitation through LLE to inhibit extensor pattern. Pt left with call bell in hand and needs met.  Therapy Documentation Precautions:  Precautions Precautions: Fall, Other (comment) Precaution Comments: decreased attention to left; CA; h/o seizures Restrictions Weight Bearing Restrictions: No  See Function Navigator for Current Functional Status.   Therapy/Group: Individual Therapy  Valma Cava 01/23/2017, 3:46 PM

## 2017-01-23 NOTE — Progress Notes (Signed)
Physical Therapy Session Note  Patient Details  Name: Kelly Stuart MRN: [id number] Date of Birth: Sep 11, 1945  Today's Date: 01/23/2017 PT Individual Time: 1100-1200 PT Individual Time Calculation (min): 60 min   Short Term Goals: Week 1:  PT Short Term Goal 1 (Week 1): Pt will be able to perform functional transfer with mod assist PT Short Term Goal 2 (Week 1): Pt will be able to propel w/c with hemi-technique with min assist PT Short Term Goal 3 (Week 1): Pt will be able to gait x 30' with max assist of 1 (w/c follow for safety) PT Short Term Goal 4 (Week 1): Pt will be able to manage LUE during transfers with mod verbal cues  Skilled Therapeutic Interventions/Progress Updates:    no c/o pain.  Session focus on NMR and activity tolerance via Bodyweight Support Treadmill Training (BWSTT).    Pt transitions sit<>stand throughout session to don/doff lite gait harness with mod assist to stand and mod/max for standing balance and R weight shift.    Pt completes 3 trials of lite gait, ambulating at 0.5 speed, 88' +86' +85' with rest breaks in between using harness for support.  During ambulation, pt requires manual facilitation for L<>R weight shift and for advancing/placing/stabilizing LLE, as well as max multimodal cues for R step, increased step length, upright posture, and hip extension.    Pt returned to room at end of session and positioned upright in w/c and positioned with call bell in reach and needs met.   Therapy Documentation Precautions:  Precautions Precautions: Fall, Other (comment) Precaution Comments: decreased attention to left; CA; h/o seizures Restrictions Weight Bearing Restrictions: No   See Function Navigator for Current Functional Status.   Therapy/Group: Individual Therapy  Michel Santee 01/23/2017, 4:21 PM

## 2017-01-23 NOTE — Progress Notes (Signed)
Subjective/Complaints:   ROS: pt denies nausea, vomiting, diarrhea, cough, shortness of breath or chest pain   Objective: Vital Signs: Blood pressure (!) 101/53, pulse 68, temperature 98 F (36.7 C), temperature source Oral, resp. rate 18, height 5\' 10"  (1.778 m), last menstrual period 08/23/2008, SpO2 95 %. No results found. Results for orders placed or performed during the hospital encounter of 01/18/17 (from the past 72 hour(s))  Glucose, capillary     Status: Abnormal   Collection Time: 01/20/17 12:15 PM  Result Value Ref Range   Glucose-Capillary 109 (H) 65 - 99 mg/dL   Comment 1 Notify RN   Glucose, capillary     Status: Abnormal   Collection Time: 01/20/17  4:27 PM  Result Value Ref Range   Glucose-Capillary 106 (H) 65 - 99 mg/dL   Comment 1 Notify RN   Glucose, capillary     Status: Abnormal   Collection Time: 01/20/17  8:32 PM  Result Value Ref Range   Glucose-Capillary 113 (H) 65 - 99 mg/dL  Glucose, capillary     Status: Abnormal   Collection Time: 01/22/17  6:25 AM  Result Value Ref Range   Glucose-Capillary 105 (H) 65 - 99 mg/dL  Glucose, capillary     Status: Abnormal   Collection Time: 01/22/17  9:06 PM  Result Value Ref Range   Glucose-Capillary 146 (H) 65 - 99 mg/dL   Comment 1 Notify RN   Glucose, capillary     Status: Abnormal   Collection Time: 01/23/17  6:56 AM  Result Value Ref Range   Glucose-Capillary 106 (H) 65 - 99 mg/dL     HEENT: normal Cardio: RRR without murmur. No JVD   Resp: CTA Bilaterally without wheezes or rales. Normal effort   GI: BS positive and NT, ND Extremity:  Pulses positive and No Edema Skin:   Intact Neuro: Alert/Oriented, Abnormal Sensory Reduced on Left and Abnormal Motor 2- L biceps, 0 left delt, triceps, finger flex/ext, 2- hip/knee ext synergy Musc/Skel:  Other no pain with UE or LE ROM. No LBP currently Gen NAD   Assessment/Plan: 1. Functional deficits secondary to RIght frontal metastasis breast CA primary which  require 3+ hours per day of interdisciplinary therapy in a comprehensive inpatient rehab setting. Physiatrist is providing close team supervision and 24 hour management of active medical problems listed below. Physiatrist and rehab team continue to assess barriers to discharge/monitor patient progress toward functional and medical goals. FIM: Function - Bathing Position: Shower Body parts bathed by patient: Left arm, Chest, Abdomen, Front perineal area, Right upper leg, Left upper leg Body parts bathed by helper: Left lower leg, Right lower leg, Right arm, Buttocks Assist Level: Touching or steadying assistance(Pt > 75%) (Max A)  Function- Upper Body Dressing/Undressing What is the patient wearing?: Pull over shirt/dress Pull over shirt/dress - Perfomed by patient: Thread/unthread right sleeve, Put head through opening Pull over shirt/dress - Perfomed by helper: Pull shirt over trunk, Thread/unthread left sleeve Assist Level: Touching or steadying assistance(Pt > 75%) Function - Lower Body Dressing/Undressing What is the patient wearing?: Pants, Non-skid slipper socks Position: Wheelchair/chair at sink Underwear - Performed by patient: Thread/unthread right underwear leg Underwear - Performed by helper: Thread/unthread left underwear leg, Pull underwear up/down Pants- Performed by patient: Thread/unthread right pants leg, Thread/unthread left pants leg Pants- Performed by helper: Pull pants up/down Non-skid slipper socks- Performed by patient: Don/doff right sock, Don/doff left sock Non-skid slipper socks- Performed by helper: Don/doff right sock, Don/doff left sock Shoes -  Performed by helper: Don/doff right shoe, Don/doff left shoe, Fasten right, Fasten left TED Hose - Performed by helper: Don/doff left TED hose, Don/doff right TED hose Assist for footwear: Supervision/touching assist Assist for lower body dressing: Touching or steadying assistance (Pt > 75%) (Max A)  Function -  Toileting Toileting steps completed by patient: Performs perineal hygiene Toileting steps completed by helper: Adjust clothing prior to toileting, Adjust clothing after toileting Toileting Assistive Devices: Other (comment) (stedy) Assist level: Two helpers  Function - Air cabin crew transfer assistive device: Elevated toilet seat/BSC over toilet Assist level to toilet: Moderate assist (Pt 50 - 74%/lift or lower) Assist level from toilet: Moderate assist (Pt 50 - 74%/lift or lower)  Function - Chair/bed transfer Chair/bed transfer method: Squat pivot Chair/bed transfer assist level: Maximal assist (Pt 25 - 49%/lift and lower) Chair/bed transfer assistive device: Armrests  Function - Locomotion: Wheelchair Will patient use wheelchair at discharge?: Yes Type: Manual Max wheelchair distance: 100' Assist Level: Maximal assistance (Pt 25 - 49%) Wheel 50 feet with 2 turns activity did not occur: Safety/medical concerns Wheel 150 feet activity did not occur: Safety/medical concerns Turns around,maneuvers to table,bed, and toilet,negotiates 3% grade,maneuvers on rugs and over doorsills: No Function - Locomotion: Ambulation Assistive device: No device Max distance: 12' Assist level: 2 helpers Assist level: 2 helpers Walk 50 feet with 2 turns activity did not occur: Safety/medical concerns Walk 150 feet activity did not occur: Safety/medical concerns Walk 10 feet on uneven surfaces activity did not occur: Safety/medical concerns  Function - Comprehension Comprehension: Auditory Comprehension assist level: Follows basic conversation/direction with no assist  Function - Expression Expression: Verbal Expression assist level: Expresses basic needs/ideas: With no assist  Function - Social Interaction Social Interaction assist level: Interacts appropriately with others - No medications needed.  Function - Problem Solving Problem solving assist level: Solves basic problems with  no assist  Function - Memory Memory assist level: More than reasonable amount of time Patient normally able to recall (first 3 days only): Current season, Location of own room, Staff names and faces, That he or she is in a hospital  Medical Problem List and Plan: 1. 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 DVT Prophylaxis/Anticoagulation: Mechanical: Sequential compression devices, below knee Bilateral lower extremities  -adjusted left SCD (tightened wrap). If still not giving same effect, we can change out 3. Pain Management: tylenol prn + kpad 4. Mood: Stable on home dose Lexapro with ativan at nights to help with sleep/anxiety. Sister supportive and mood appears stable. LCSW to follow for evaluation and support.  5. Neuropsych: This patient iscapable of making decisions on herown behalf. 6. Skin/Wound Care: routine pressure relief measures. Maintain adequate nutritional and hydration status.  7. Fluids/Electrolytes/Nutrition: Monitor I/O. CMET nl 9/27 except low alb, po intake 379ml  -continue to encourage po 8. 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  9. 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  10 GERD: due to steroids. Managed with PPI.  11. Right frontoparietal metastatic disease: Hemorrhage and increase in edema treated with decadron    - Hem/Onc recommended decreasing to once a day at discharge  --decreased to daily 9/27 12. HTN: Monitor BP bid. Continue Norvasc daily. Controlled 10/1--on low side reduce amlodipine to 2.5mg   Vitals:   01/22/17 1445 01/23/17 0547  BP: (!) 100/58 (!) 101/53  Pulse: 87 68  Resp: 20 18  Temp: 98 F (36.7 C) 98 F (36.7 C)  SpO2: 95% 95%   13. Constipation: H . BM  10/1  LOS (Days) 5 A FACE TO FACE EVALUATION WAS PERFORMED  KIRSTEINS,ANDREW E 01/23/2017, 7:34 AM

## 2017-01-23 NOTE — Progress Notes (Signed)
Physical Therapy Session Note  Patient Details  Name: Kelly Stuart MRN: [id number] Date of Birth: [birth year]  Today's Date: 01/23/2017 PT Individual Time: 1415-1445 PT Individual Time Calculation (min): 30 min   Short Term Goals: Week 1:  PT Short Term Goal 1 (Week 1): Pt will be able to perform functional transfer with mod assist PT Short Term Goal 2 (Week 1): Pt will be able to propel w/c with hemi-technique with min assist PT Short Term Goal 3 (Week 1): Pt will be able to gait x 30' with max assist of 1 (w/c follow for safety) PT Short Term Goal 4 (Week 1): Pt will be able to manage LUE during transfers with mod verbal cues  Skilled Therapeutic Interventions/Progress Updates:    no c/o pain, session focus on NMR via ambulation over level ground and positioning.    Pt requires mod assist to transition from w/c to standing and max multimodal cues for forward weight shift and forward gaze.  Attempted ambulation with modified 3 muskateers and w/c follow for safety, however pt unable to control LEs for swing phase or coordinate weight bearing through either LE for stance phase.    Pt returned to room at end of session and positioned in recliner with mod assist to transfer, call bell in reach and needs met.   Therapy Documentation Precautions:  Precautions Precautions: Fall, Other (comment) Precaution Comments: decreased attention to left; CA; h/o seizures Restrictions Weight Bearing Restrictions: No   See Function Navigator for Current Functional Status.   Therapy/Group: Individual Therapy  Michel Santee 01/23/2017, 4:26 PM

## 2017-01-23 NOTE — Progress Notes (Signed)
Pt complaining of shortness of breath after taking her to the restroom and getting her back to bed. She is diaphoretic and pale. Blood pressure is 88/66 with a pulse of 140. O2 levels were at 90. On call physician notified and RN was advised to put patients legs up and use O2 [place] no more than 2 L. Pt was put on 1L O2 [place] and pt's saturations improved to 95. Pt advised to relax and take deep breaths. Pulse still elevated. Pt requesting Ativan but RN was advised to hold off due to low blood pressure. Report given to Oncoming RN. Continue plan of care.

## 2017-01-24 ENCOUNTER — Inpatient Hospital Stay (HOSPITAL_COMMUNITY): Payer: Medicare Other | Admitting: Physical Therapy

## 2017-01-24 ENCOUNTER — Inpatient Hospital Stay (HOSPITAL_COMMUNITY): Payer: Medicare Other | Admitting: Occupational Therapy

## 2017-01-24 ENCOUNTER — Encounter (HOSPITAL_COMMUNITY): Payer: Self-pay

## 2017-01-24 ENCOUNTER — Inpatient Hospital Stay (HOSPITAL_COMMUNITY): Payer: Medicare Other | Admitting: *Deleted

## 2017-01-24 ENCOUNTER — Telehealth: Payer: Self-pay | Admitting: Hematology and Oncology

## 2017-01-24 DIAGNOSIS — I610 Nontraumatic intracerebral hemorrhage in hemisphere, subcortical: Secondary | ICD-10-CM

## 2017-01-24 MED ORDER — SIMETHICONE 80 MG PO CHEW: 80.0000 mg | CHEWABLE_TABLET | Freq: Four times a day (QID) | ORAL | Status: DC | PRN

## 2017-01-24 MED ORDER — DOCUSATE SODIUM 100 MG PO CAPS
100.0000 mg | ORAL_CAPSULE | Freq: Every day | ORAL | Status: DC
  Filled 2017-01-24 (×3): qty 1

## 2017-01-24 MED ORDER — PREMIER PROTEIN SHAKE
2.0000 [oz_av] | Freq: Two times a day (BID) | ORAL | Status: DC
  Administered 2017-01-25 – 2017-01-27 (×3): 2 [oz_av] via ORAL
  Filled 2017-01-24 (×11): qty 325.31

## 2017-01-24 NOTE — Evaluation (Signed)
Recreational Therapy Assessment and Plan  Patient Details  Name: Kelly Stuart MRN: [id number] Date of Birth: [birth year] Today's Date: 01/24/2017  Rehab Potential: Good ELOS: 3 weeks   Assessment Problem List:      Patient Active Problem List   Diagnosis Date Noted  . Metastatic cancer to brain ([place]) 01/18/2017  . Malnutrition of moderate degree 01/17/2017  . Hemorrhage, intracerebral ([place]) 01/16/2017  . 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 Left hemiplegia ([place]) 01/15/2017  . Acute left-sided weakness 01/15/2017  . Hemiparesis of left nondominant side due to non-cerebrovascular etiology ([place])   . Fatigue 01/06/2017  . Focal seizures ([place]) 12/30/2016  . 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 Brain metastases ([place]) 12/13/2016  . Metastatic lung cancer (metastasis from lung to other site), unspecified laterality ([place]) 11/28/2016  . Lung nodules 11/14/2016  . Primary cancer of upper outer quadrant of left female breast ([place]) 05/05/2011    Past Medical History:      Past Medical History:  Diagnosis Date  . Anxiety   . Brain cancer ([place])   . Breast cancer ([place])    ER+PR+ HER-2NEU negative  . Lung cancer [facility])    Past Surgical History:       Past Surgical History:  Procedure Laterality Date  . BREAST LUMPECTOMY WITH NEEDLE LOCALIZATION  02/29/2012   Procedure: BREAST LUMPECTOMY WITH NEEDLE LOCALIZATION;  Surgeon: Rolm Bookbinder, MD;  Location: [place];  Service: General;  Laterality: Left;  . BREAST SURGERY    . COSMETIC SURGERY    . DILATION AND CURETTAGE OF UTERUS  1998   submucus myoma-resected  . EYE SURGERY    . FACELIFT W/BLEPHAROPLASTY  1999      . maxillofacial surgery  1980   mouth-ealign teeth-  . minifacelift  2007  . RADIOACTIVE SEED GUIDED EXCISIONAL BREAST BIOPSY N/A 02/11/2016   Procedure: LEFT BREAST RADIOACTIVE SEED GUIDED EXCISIONAL BIOPSY;  Surgeon: Rolm Bookbinder, MD;  Location: [place];  Service: General;  Laterality: N/A;  . TONSILLECTOMY      Assessment & Plan Clinical Impression: Patient is a 71 y.o. year old female who was hospitalized 2 weeks back for focal seizures and generalized weakness. She was started on Keppra and Decadron and discharged home. Patient being planned to start systemic chemotherapy inOctober by oncology. She had radiation to the brain done on 9/10 and completed a course of Decadron 2 weeks back. Patient started having weakness of her left side on 9/21 symptoms worsened and over the past 2 days he was progressively unable to move both her left arm and legs. CT scan showed 4.3 x 4.5 cm hemorrhagic metastatic deposit in the right frontal parietal lobe has increased in size since the prior studies. Patient transferred to CIR on 01/18/2017 .    Pt presents with decreased activity tolerance, decreased functional mobility, decreased balance, decreased coordination, decreased initiation, decreased attention, decreased awareness, decreased problem solving Limiting pt's independence with leisure/community pursuits.  Leisure History/Participation Premorbid leisure interest/current participation: Medical laboratory scientific officer - Building control surveyor - Doctor, hospital - Travel (Comment);Sports - Exercise (Comment) (yoga- "hot Yoga", is a massage therapist at [employer]) Expression Interests: Music (Comment) Other Leisure Interests: Movies Leisure Participation Style: With Family/Friends Awareness of Community Resources: Good-identify 3 post discharge leisure resources Psychosocial / Spiritual Social interaction - Mood/Behavior: Cooperative Academic librarian Appropriate for Education?: Yes Recreational Therapy Orientation Orientation -Reviewed with patient: Available activity resources Strengths/Weaknesses Patient Strengths/Abilities: Willingness to participate;Active  premorbidly Patient weaknesses: Physical  limitations TR Patient demonstrates impairments in the following area(s): Endurance;Motor;Safety  Plan Rec Therapy Plan Is patient appropriate for Therapeutic Recreation?: Yes Rehab Potential: Good Treatment times per week: Min 1 TR session/group >25 minutes during LOS Estimated Length of Stay: 3 weeks TR Treatment/Interventions: Adaptive equipment instruction;1:1 session;Balance/vestibular training;Functional mobility training;Community reintegration;Group participation (Comment);Patient/family education;Therapeutic activities;Recreation/leisure participation;Leisure education;Therapeutic exercise;UE/LE Coordination activities;Visual/perceptual remediation/compensation;Wheelchair propulsion/positioning  Recommendations for other services: None   Discharge Criteria: Patient will be discharged from TR if patient refuses treatment 3 consecutive times without medical reason.  If treatment goals not met, if there is a change in medical status, if patient makes no progress towards goals or if patient is discharged from hospital.  The above assessment, treatment plan, treatment alternatives and goals were discussed and mutually agreed upon: by patient  [place] 01/24/2017, 12:13 PM

## 2017-01-24 NOTE — Progress Notes (Signed)
Patients blood pressure & pulse were retaken last night & was WNL. Her meds were given as ordered & tolerated well. Her anxiety was much less, no sweating or SOB. She expressed no signs of distress at that time & was alert. She had 1 large loose bowel movement & was incontinent at that time. Patient was discouraged from straining during bowel movements, but patient had multiple loose stools & abdominal cramping during the day. Pericare was provided & patient was reassured. During the night, she stopped having loose stools & did not c/o abdominal pain or cramping. Monitoring continued.

## 2017-01-24 NOTE — Telephone Encounter (Signed)
I called the patient but her telephone does not accept any voice mail. 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

## 2017-01-24 NOTE — Progress Notes (Signed)
Physical Therapy Session Note  Patient Details  Name: Kelly Stuart MRN: [id number] Date of Birth: December 15, 1945  Today's Date: 01/24/2017 PT Individual Time: 0900-1015 PT Individual Time Calculation (min): 75 min   Short Term Goals: Week 1:  PT Short Term Goal 1 (Week 1): Pt will be able to perform functional transfer with mod assist PT Short Term Goal 2 (Week 1): Pt will be able to propel w/c with hemi-technique with min assist PT Short Term Goal 3 (Week 1): Pt will be able to gait x 30' with max assist of 1 (w/c follow for safety) PT Short Term Goal 4 (Week 1): Pt will be able to manage LUE during transfers with mod verbal cues  Skilled Therapeutic Interventions/Progress Updates:    no c/o pain throughout session; Session focused on functional mobility and NMR.  Pt performed lower body dressing at bed level with mod A for sequencing, pulling pants up, and technique. Pt performed bed mobility with mod A in order to initiate trunk lean to come to upright position, sitting EOB. Pt performed transfer bed > w/c with mod A and v/c for technique, body position, and sequencing. Pt required mod A for proper positioning in w/c and scooting hips into position. Pt required total A for putting on shoes in w/c. Pt required mod A for upper body dressing seated in w/c. Pt performed weight shifting, seated in w/c, with therapy bench between LEs and mirror in front for visual postural cues. Pt reached forward and placed cups on bench out in front and returned to upright posture between each cup placement. Cups also placed on L side on mat table and pt instructed to pick up cup on L and then shift forward to place cup on bench. Pt required verbal cuing for upright posture and to avoid lateral lean as well as visual cues of reflection in mirror. Pt required tactile cuing intermittently for correction of L lean. Pt required frequent rest breaks and stated she did feel more fatigued. Pt reported having a panic attack  yesterday and feeling as though she needed a nap to 'reset her brain.' Pt transferred w/c > recliner with max A for lifting, lowering, and placement of UEs and LEs as well as blocking of L LE. Pt positioned in recliner with call bell and all needs addressed.   Therapy Documentation Precautions:  Precautions Precautions: Fall, Other (comment) Precaution Comments: decreased attention to left; CA; h/o seizures Restrictions Weight Bearing Restrictions: No   See Function Navigator for Current Functional Status.   Therapy/Group: Individual Therapy  Harriet Masson 01/24/2017, 10:29 AM

## 2017-01-24 NOTE — Progress Notes (Signed)
Subjective/Complaints:  No issues overnite, some hypersensitivity noted in LLE on plantar area ROS: pt denies nausea, vomiting, diarrhea, cough, shortness of breath or chest pain   Objective: Vital Signs: Blood pressure (!) 90/59, pulse 96, temperature 98 F (36.7 C), temperature source Oral, resp. rate 18, height 5\' 10"  (1.778 m), last menstrual period 08/23/2008, SpO2 93 %. No results found. Results for orders placed or performed during the hospital encounter of 01/18/17 (from the past 72 hour(s))  Glucose, capillary     Status: Abnormal   Collection Time: 01/22/17  6:25 AM  Result Value Ref Range   Glucose-Capillary 105 (H) 65 - 99 mg/dL  Glucose, capillary     Status: Abnormal   Collection Time: 01/22/17  9:06 PM  Result Value Ref Range   Glucose-Capillary 146 (H) 65 - 99 mg/dL   Comment 1 Notify RN   Glucose, capillary     Status: Abnormal   Collection Time: 01/23/17  6:56 AM  Result Value Ref Range   Glucose-Capillary 106 (H) 65 - 99 mg/dL     HEENT: normal Cardio: RRR without murmur. No JVD   Resp: CTA Bilaterally without wheezes or rales. Normal effort   GI: BS positive and NT, ND Extremity:  Pulses positive and No Edema Skin:   Intact Neuro: Alert/Oriented, Abnormal Sensory Reduced on Left and Abnormal Motor 2- L biceps, 0 left delt, triceps, finger flex/ext, 2- hip/knee ext synergy, no evidence of flexor withdrawl spasticity Musc/Skel:  Other no pain with UE or LE ROM. No LBP currently Gen NAD   Assessment/Plan: 1. Functional deficits secondary to RIght frontal metastasis breast CA primary which require 3+ hours per day of interdisciplinary therapy in a comprehensive inpatient rehab setting. Physiatrist is providing close team supervision and 24 hour management of active medical problems listed below. Physiatrist and rehab team continue to assess barriers to discharge/monitor patient progress toward functional and medical goals. FIM: Function - Bathing Position:  Shower Body parts bathed by patient: Left arm, Chest, Abdomen, Front perineal area, Right upper leg, Left upper leg, Right lower leg, Left lower leg Body parts bathed by helper: Right arm, Buttocks Assist Level: Touching or steadying assistance(Pt > 75%), Assistive device Assistive Device Comment: long handled sponge and wash mit   Function- Upper Body Dressing/Undressing What is the patient wearing?: Pull over shirt/dress Pull over shirt/dress - Perfomed by patient: Thread/unthread right sleeve, Put head through opening Pull over shirt/dress - Perfomed by helper: Pull shirt over trunk, Thread/unthread left sleeve Assist Level: Touching or steadying assistance(Pt > 75%) Function - Lower Body Dressing/Undressing What is the patient wearing?: Pants, Non-skid slipper socks Position: Wheelchair/chair at sink Underwear - Performed by patient: Thread/unthread right underwear leg Underwear - Performed by helper: Thread/unthread left underwear leg, Pull underwear up/down Pants- Performed by patient: Thread/unthread right pants leg, Thread/unthread left pants leg Pants- Performed by helper: Pull pants up/down Non-skid slipper socks- Performed by patient: Don/doff right sock, Don/doff left sock Non-skid slipper socks- Performed by helper: Don/doff right sock, Don/doff left sock Shoes - Performed by helper: Don/doff right shoe, Don/doff left shoe, Fasten right, Fasten left TED Hose - Performed by helper: Don/doff left TED hose, Don/doff right TED hose Assist for footwear: Supervision/touching assist Assist for lower body dressing: Touching or steadying assistance (Pt > 75%) (Max A)  Function - Toileting Toileting steps completed by patient: Performs perineal hygiene Toileting steps completed by helper: Adjust clothing prior to toileting, Adjust clothing after toileting Toileting Assistive Devices: Other (comment) (stedy) Assist level:  Two helpers  Function - Air cabin crew transfer  assistive device: Elevated toilet seat/BSC over toilet Assist level to toilet: Moderate assist (Pt 50 - 74%/lift or lower) Assist level from toilet: Moderate assist (Pt 50 - 74%/lift or lower)  Function - Chair/bed transfer Chair/bed transfer method: Squat pivot Chair/bed transfer assist level: Moderate assist (Pt 50 - 74%/lift or lower) Chair/bed transfer assistive device: Armrests Mechanical lift: Stedy Chair/bed transfer details: Manual facilitation for weight shifting, Verbal cues for safe use of DME/AE, Verbal cues for precautions/safety, Verbal cues for technique, Verbal cues for sequencing  Function - Locomotion: Wheelchair Will patient use wheelchair at discharge?: Yes Type: Manual Max wheelchair distance: 100' Assist Level: Maximal assistance (Pt 25 - 49%) Wheel 50 feet with 2 turns activity did not occur: Safety/medical concerns Wheel 150 feet activity did not occur: Safety/medical concerns Turns around,maneuvers to table,bed, and toilet,negotiates 3% grade,maneuvers on rugs and over doorsills: No Function - Locomotion: Ambulation Assistive device: Lite gait Max distance: 88 Assist level: 2 helpers Assist level: 2 helpers Walk 50 feet with 2 turns activity did not occur: Safety/medical concerns Assist level: 2 helpers Walk 150 feet activity did not occur: Safety/medical concerns Walk 10 feet on uneven surfaces activity did not occur: Safety/medical concerns  Function - Comprehension Comprehension: Auditory Comprehension assist level: Follows basic conversation/direction with no assist  Function - Expression Expression: Verbal Expression assist level: Expresses basic needs/ideas: With no assist  Function - Social Interaction Social Interaction assist level: Interacts appropriately with others with medication or extra time (anti-anxiety, antidepressant).  Function - Problem Solving Problem solving assist level: Solves basic problems with no assist  Function -  Memory Memory assist level: More than reasonable amount of time Patient normally able to recall (first 3 days only): Current season, Location of own room, Staff names and faces, That he or she is in a hospital  Medical Problem List and Plan: 1. Left hemiparesis  secondary to Right frontal metastasis from breast primary CIR PT OT, SLP- team conf in am 2. DVT Prophylaxis/Anticoagulation: Mechanical: Sequential compression devices, below knee Bilateral lower extremities  -adjusted left SCD (tightened wrap). If still not giving same effect, we can change out 3. Pain Management: tylenol prn + kpad 4. Mood: Stable on home dose Lexapro with ativan at nights to help with sleep/anxiety. Sister supportive and mood appears stable. LCSW to follow for evaluation and support.  5. Neuropsych: This patient iscapable of making decisions on herown behalf. 6. Skin/Wound Care: routine pressure relief measures. Maintain adequate nutritional and hydration status.  7. Fluids/Electrolytes/Nutrition: Monitor I/O. CMET nl 9/27 except low alb, po intake 639ml on 10/1  -continue to encourage po 8. 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  9. 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  10 GERD: due to steroids. Managed with PPI.  11. Right frontoparietal metastatic disease: Hemorrhage and increase in edema treated with decadron     --decreased to daily 9/27 12. HTN: Monitor BP bid. Continue Norvasc daily.10/2--on low side d/c amlodipine, mild tachy may trial BB Vitals:   01/23/17 2004 01/24/17 0400  BP: 111/75 (!) 90/59  Pulse: (!) 110 96  Resp: 18 18  Temp: 98.6 F (37 C) 98 F (36.7 C)  SpO2: 94% 93%   13. Constipation: H . BM  10/1  LOS (Days) 6 A FACE TO FACE EVALUATION WAS PERFORMED  KIRSTEINS,ANDREW E 01/24/2017, 7:23 AM

## 2017-01-24 NOTE — Progress Notes (Signed)
Occupational Therapy Session Note  Patient Details  Name: Kelly Stuart MRN: [id number] Date of Birth: Dec 09, 1945  Today's Date: 01/24/2017 OT Individual Time: 1301-1431 OT Individual Time Calculation (min): 90 min    Short Term Goals: Week 1:  OT Short Term Goal 1 (Week 1): Pt will recall hemi-dressing techniques 2 consecutive days with min questioning cues OT Short Term Goal 2 (Week 1): Pt will look past midline to left to locate 3/4 grooming items on L side of sink OT Short Term Goal 3 (Week 1): Pt will maintian midline orientation in sitting with MIN A  Skilled Therapeutic Interventions/Progress Updates:    OT treatment session focused on L UE NMR, postural control, core strengthening, and L side attention. Pt reported having a bad night last night with a panic attack, but has felt better this morning. Pt completed squat-pivot transfer to R with Max A, LLE blocked and therapist over top of patient to maintain anterior weihgt shift. Pt transferred in similar fashion to therapy mat. Utilized mirror feedback to achieve midline sitting balance. OT placed small wedge under buttocks to promote forward pelvic position. Pt then completed therapeutic activity using mirror with letters placed on L side. Utilized hand over hadn A to achieve finger isolation, then pt able to turn on shoulder to help guide UE through erasing letters. In sitting, utilized guided imagery and bilateral feedback to promote L elbow flex/ext and shoulder flex/ext. Pt then brought into sidelying for L NMR in gravity eliminated position. Utilized UE ranger and weight bearing to turn on triceps and biceps and facilitate elbow flex/ext. OT facilitated normal movement patterns by facilitating through elbow to decrease exaggerated shoulder abduction. Progressed to shoulder flex/ext with facilitation at elbow as well to facilitate normal movement pattern. Pt with improved activation with repetition. Worked on scooting on edge of mat L and  R with weight bearing forced through LLE and facilitation over back to maintain anterior weight shift. Max A to transfer back to wc on L side in similar fashion as above. Pt left seated in wc with L lap tray, safety belt on, and needs met.   Therapy Documentation Precautions:  Precautions Precautions: Fall, Other (comment) Precaution Comments: decreased attention to left; CA; h/o seizures Restrictions Weight Bearing Restrictions: No Pain:  none/denies pain ADL: ADL ADL Comments: Please see functional navigator  See Function Navigator for Current Functional Status.   Therapy/Group: Individual Therapy  Valma Cava 01/24/2017, 1:48 PM

## 2017-01-24 NOTE — Progress Notes (Signed)
Physical Therapy Session Note  Patient Details  Name: Kelly Stuart MRN: [id number] Date of Birth: Apr 19, 1946  Today's Date: 01/24/2017 PT Individual Time: 1530-1605 PT Individual Time Calculation (min): 35 min   Short Term Goals: Week 1:  PT Short Term Goal 1 (Week 1): Pt will be able to perform functional transfer with mod assist PT Short Term Goal 2 (Week 1): Pt will be able to propel w/c with hemi-technique with min assist PT Short Term Goal 3 (Week 1): Pt will be able to gait x 30' with max assist of 1 (w/c follow for safety) PT Short Term Goal 4 (Week 1): Pt will be able to manage LUE during transfers with mod verbal cues  Skilled Therapeutic Interventions/Progress Updates:    no c/o pain throughout session; session focused on therapeutic activity and change of scenery.   Pt taken outside for therapeutic change of scenery. Therapeutic use of self during discussion with therapist regarding current level of function, impairments, expected recovery, and discharge planning. Pt returned to room, transfer to recliner with mod A, positioned for comfort, call bell within reach, all needs addressed.   Therapy Documentation Precautions:  Precautions Precautions: Fall, Other (comment) Precaution Comments: decreased attention to left; CA; h/o seizures Restrictions Weight Bearing Restrictions: No   See Function Navigator for Current Functional Status.   Therapy/Group: Individual Therapy  Harriet Masson 01/24/2017, 4:07 PM

## 2017-01-25 ENCOUNTER — Inpatient Hospital Stay (HOSPITAL_COMMUNITY): Payer: Medicare Other | Admitting: Occupational Therapy

## 2017-01-25 ENCOUNTER — Inpatient Hospital Stay (HOSPITAL_COMMUNITY): Payer: Medicare Other | Admitting: Physical Therapy

## 2017-01-25 ENCOUNTER — Encounter (HOSPITAL_COMMUNITY): Payer: Medicare Other | Admitting: Psychology

## 2017-01-25 ENCOUNTER — Inpatient Hospital Stay (HOSPITAL_COMMUNITY): Payer: Medicare Other

## 2017-01-25 ENCOUNTER — Ambulatory Visit: Payer: Medicare Other | Admitting: Rehabilitative and Restorative Service Providers"

## 2017-01-25 DIAGNOSIS — I611 Nontraumatic intracerebral hemorrhage in hemisphere, cortical: Secondary | ICD-10-CM

## 2017-01-25 LAB — CBC WITH DIFFERENTIAL/PLATELET
BASOS ABS: 0 10*3/uL (ref 0.0–0.1)
BASOS PCT: 0 %
EOS ABS: 0.1 10*3/uL (ref 0.0–0.7)
Eosinophils Relative: 3 %
HEMATOCRIT: 30.6 % — AB (ref 36.0–46.0)
Hemoglobin: 10 g/dL — ABNORMAL LOW (ref 12.0–15.0)
Lymphocytes Relative: 18 %
Lymphs Abs: 0.9 10*3/uL (ref 0.7–4.0)
MCH: 28.5 pg (ref 26.0–34.0)
MCHC: 32.7 g/dL (ref 30.0–36.0)
MCV: 87.2 fL (ref 78.0–100.0)
MONO ABS: 0.6 10*3/uL (ref 0.1–1.0)
Monocytes Relative: 12 %
NEUTROS ABS: 3.3 10*3/uL (ref 1.7–7.7)
NEUTROS PCT: 67 %
Platelets: 120 10*3/uL — ABNORMAL LOW (ref 150–400)
RBC: 3.51 MIL/uL — ABNORMAL LOW (ref 3.87–5.11)
RDW: 15.8 % — AB (ref 11.5–15.5)
WBC: 5 10*3/uL (ref 4.0–10.5)

## 2017-01-25 LAB — BASIC METABOLIC PANEL
Anion gap: 9 (ref 5–15)
BUN: 13 mg/dL (ref 6–20)
CALCIUM: 8.4 mg/dL — AB (ref 8.9–10.3)
CHLORIDE: 101 mmol/L (ref 101–111)
CO2: 23 mmol/L (ref 22–32)
CREATININE: 0.6 mg/dL (ref 0.44–1.00)
GFR calc Af Amer: >60 mL/min (ref >60)
GLUCOSE: 104 mg/dL — AB (ref 65–99)
POTASSIUM: 3.6 mmol/L (ref 3.5–5.1)
SODIUM: 133 mmol/L — AB (ref 135–145)

## 2017-01-25 LAB — GLUCOSE, CAPILLARY: GLUCOSE-CAPILLARY: 110 mg/dL — AB (ref 65–99)

## 2017-01-25 MED ORDER — BENEPROTEIN PO POWD
1.0000 | Freq: Three times a day (TID) | ORAL | Status: DC
  Administered 2017-01-25 – 2017-01-28 (×7): 6 g via ORAL
  Filled 2017-01-25: qty 227

## 2017-01-25 NOTE — Progress Notes (Addendum)
Subjective/Complaints:  Appreciate Onc phone encounter, msg sent to inform Dr Lindi Adie of pt's admission Pt working with OT this am  ROS: pt denies nausea, vomiting, diarrhea, cough, shortness of breath or chest pain   Objective: Vital Signs: Blood pressure (!) 99/50, pulse 82, temperature 97.7 F (36.5 C), temperature source Oral, resp. rate 20, height _0  (1.778 m), last menstrual period 08/23/2008, SpO2 95 %. No results found. Results for orders placed or performed during the hospital encounter of 01/18/17 (from the past 72 hour(s))  Glucose, capillary     Status: Abnormal   Collection Time: 01/22/17  9:06 PM  Result Value Ref Range   Glucose-Capillary 146 (H) 65 - 99 mg/dL   Comment 1 Notify RN   Glucose, capillary     Status: Abnormal   Collection Time: 01/23/17  6:56 AM  Result Value Ref Range   Glucose-Capillary 106 (H) 65 - 99 mg/dL  Glucose, capillary     Status: Abnormal   Collection Time: 01/25/17  6:41 AM  Result Value Ref Range   Glucose-Capillary 110 (H) 65 - 99 mg/dL  CBC with Differential/Platelet     Status: Abnormal   Collection Time: 01/25/17  6:52 AM  Result Value Ref Range   WBC 5.0 4.0 - 10.5 K/uL   RBC 3.51 (L) 3.87 - 5.11 MIL/uL   Hemoglobin 10.0 (L) 12.0 - 15.0 g/dL   HCT 30.6 (L) 36.0 - 46.0 %   MCV 87.2 78.0 - 100.0 fL   MCH 28.5 26.0 - 34.0 pg   MCHC 32.7 30.0 - 36.0 g/dL   RDW 15.8 (H) 11.5 - 15.5 %   Platelets 120 (L) 150 - 400 K/uL   Neutrophils Relative % 67 %   Neutro Abs 3.3 1.7 - 7.7 K/uL   Lymphocytes Relative 18 %   Lymphs Abs 0.9 0.7 - 4.0 K/uL   Monocytes Relative 12 %   Monocytes Absolute 0.6 0.1 - 1.0 K/uL   Eosinophils Relative 3 %   Eosinophils Absolute 0.1 0.0 - 0.7 K/uL   Basophils Relative 0 %   Basophils Absolute 0.0 0.0 - 0.1 K/uL     HEENT: normal Cardio: RRR without murmur. No JVD   Resp: CTA Bilaterally without wheezes or rales. Normal effort   GI: BS positive and NT, ND Extremity:  Pulses positive and No  Edema Skin:   Intact Neuro: Alert/Oriented, Abnormal Sensory Reduced on Left and Abnormal Motor 2- L biceps, 0 left delt, triceps, finger flex/ext, 2- hip/knee ext synergy, no evidence of flexor withdrawl spasticity Musc/Skel:  Other no pain with UE or LE ROM. No LBP currently Gen NAD   Assessment/Plan: 1. Functional deficits secondary to RIght frontal metastasis breast CA primary which require 3+ hours per day of interdisciplinary therapy in a comprehensive inpatient rehab setting. Physiatrist is providing close team supervision and 24 hour management of active medical problems listed below. Physiatrist and rehab team continue to assess barriers to discharge/monitor patient progress toward functional and medical goals. FIM: Function - Bathing Position: Shower Body parts bathed by patient: Left arm, Chest, Abdomen, Front perineal area, Right upper leg, Left upper leg, Right lower leg, Left lower leg Body parts bathed by helper: Right arm, Buttocks Assist Level: Touching or steadying assistance(Pt > 75%), Assistive device Assistive Device Comment: long handled sponge and wash mit   Function- Upper Body Dressing/Undressing What is the patient wearing?: Pull over shirt/dress Pull over shirt/dress - Perfomed by patient: Thread/unthread right sleeve, Put head through opening Pull  over shirt/dress - Perfomed by helper: Thread/unthread left sleeve, Pull shirt over trunk Assist Level: Touching or steadying assistance(Pt > 75%) Function - Lower Body Dressing/Undressing What is the patient wearing?: Pants, Non-skid slipper socks, Shoes Position: Bed Underwear - Performed by patient: Thread/unthread right underwear leg Underwear - Performed by helper: Thread/unthread left underwear leg, Pull underwear up/down Pants- Performed by patient: Thread/unthread right pants leg, Thread/unthread left pants leg Pants- Performed by helper: Pull pants up/down Non-skid slipper socks- Performed by patient:  Don/doff right sock, Don/doff left sock Non-skid slipper socks- Performed by helper: Don/doff right sock, Don/doff left sock Shoes - Performed by helper: Don/doff right shoe, Don/doff left shoe, Fasten right, Fasten left TED Hose - Performed by helper: Don/doff left TED hose, Don/doff right TED hose Assist for footwear: Dependant Assist for lower body dressing: Touching or steadying assistance (Pt > 75%)  Function - Toileting Toileting steps completed by patient: Performs perineal hygiene Toileting steps completed by helper: Adjust clothing prior to toileting, Adjust clothing after toileting Toileting Assistive Devices: Other (comment) (stedy) Assist level: Two helpers  Function - Air cabin crew transfer assistive device: Elevated toilet seat/BSC over toilet, Mechanical lift Mechanical lift: Stedy Assist level to toilet: Moderate assist (Pt 50 - 74%/lift or lower) Assist level from toilet: Moderate assist (Pt 50 - 74%/lift or lower)  Function - Chair/bed transfer Chair/bed transfer method: Squat pivot Chair/bed transfer assist level: Maximal assist (Pt 25 - 49%/lift and lower) Chair/bed transfer assistive device: Armrests Mechanical lift: Stedy Chair/bed transfer details: Manual facilitation for weight shifting, Manual facilitation for placement, Verbal cues for sequencing, Verbal cues for technique, Verbal cues for precautions/safety  Function - Locomotion: Wheelchair Will patient use wheelchair at discharge?: Yes Type: Manual Max wheelchair distance: 100' Assist Level: Maximal assistance (Pt 25 - 49%) Wheel 50 feet with 2 turns activity did not occur: Safety/medical concerns Wheel 150 feet activity did not occur: Safety/medical concerns Turns around,maneuvers to table,bed, and toilet,negotiates 3% grade,maneuvers on rugs and over doorsills: No Function - Locomotion: Ambulation Assistive device: Lite gait Max distance: 88 Assist level: 2 helpers Assist level: 2  helpers Walk 50 feet with 2 turns activity did not occur: Safety/medical concerns Assist level: 2 helpers Walk 150 feet activity did not occur: Safety/medical concerns Walk 10 feet on uneven surfaces activity did not occur: Safety/medical concerns  Function - Comprehension Comprehension: Auditory Comprehension assist level: Follows basic conversation/direction with no assist  Function - Expression Expression: Verbal Expression assist level: Expresses basic needs/ideas: With no assist  Function - Social Interaction Social Interaction assist level: Interacts appropriately with others with medication or extra time (anti-anxiety, antidepressant).  Function - Problem Solving Problem solving assist level: Solves basic problems with no assist  Function - Memory Memory assist level: More than reasonable amount of time Patient normally able to recall (first 3 days only): Current season, Location of own room, Staff names and faces, That he or she is in a hospital  Medical Problem List and Plan: 1. 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 Formulized individual treatment plan based on medical history, underlying problem and comorbidities. 2. DVT Prophylaxis/Anticoagulation: Mechanical: Sequential compression devices, below knee Bilateral lower extremities  -adjusted left SCD (tightened wrap). If still not giving same effect, we can change out 3. Pain Management: tylenol prn + kpad 4. Mood: Stable on home dose Lexapro  with ativan at nights to help with sleep/anxiety. Sister supportive and mood appears stable. LCSW to follow for evaluation and support.  5. Neuropsych: This patient iscapable of making decisions on herown behalf. 6. Skin/Wound Care: routine pressure relief measures. Maintain  adequate nutritional and hydration status.  7. Fluids/Electrolytes/Nutrition: Monitor I/O. CMET nl 9/27 except low alb, po intake 356m on 10/1  -continue to encourage po, check BMET 8. 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  9. 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  10 GERD: due to steroids. Managed with PPI.  11. Right frontoparietal metastatic disease: Hemorrhage and increase in edema treated with decadron     -- 12. HTN: Normotensive off meds , check BMET Monitor BP bid.  Vitals:   01/24/17 1435 01/25/17 0500  BP: 107/62 (!) 99/50  Pulse: 89 82  Resp: (!) 30 20  Temp: 97.8 F (36.6 C) 97.7 F (36.5 C)  SpO2: 95% 95%   13. Constipation: H . BM  10/3  LOS (Days) 7 A FACE TO FACE EVALUATION WAS PERFORMED  Machaela Caterino E 01/25/2017, 8:00 AM

## 2017-01-25 NOTE — Progress Notes (Signed)
Radiation Oncology         [phone] ________________________________  Name: Kelly Stuart MRN: [id number]  Date: 01/18/2017  DOB: 07-03-1945  Follow-Up Visit Note  Inpatient  CC: Jonathon Jordan, MD  No ref. provider found  Diagnosis and Prior Radiotherapy: brain metastases  CHIEF COMPLAINT: bleeding brain tumor                      Previous Radiation: 1. Spine: 12/19/16-12/23/16, 30 Gy in 10 fractions 2. SRS: 12/30/16-01/04/17, PVT1-27 Gy in 3 fractions, PVT2-20Gy in 1 fractions, PVT3- 20 Gy in 1 fraction   Narrative:  Kelly Stuart is a wonderful 71 y.o. female who recently finished a course of palliative radiation to the spine and SRS to the brain for ongoing management of her metastatic lung cancer.   Initially, she presented into our clinic on 12/12/16 for consult. Prior to this, she had an MRI ordered by Dr Lindi Adie which showed a 24x20 hemorrhagic metastatic deposit in the right frontal parietal lobe over the convexity near the motor strip, as well as a 6.5cm enhancing metastatic deposit in the left medial parietal lobe. She underwent SRS for this from 12/30/16-01/04/17. Following her first [facility] treatment she did experience some balance issues and difficulty with ambulation, her family brought her into the ED at that time for which she was admitted with ataxia. She had a repeat brain MRI, as well as a C and T-spine MRI while admitted without evidence of cord compression. She was discharged the following day and continued with her SRS treatments.   Her following treatments went well and she was recovering steadily until 01/13/17 when she began to develop weakness on her left side. Her family called her neuro-oncologist following her developing these symptoms who recommended monitoring her symptoms for progression. Her symptoms continued to worsen until 01/15/17 when she was brought into the ED unable to move her left arm or left leg due to her  weakness. CT head was performed in the ED which showed a 4.3x4.5cm hemorrhagic metastatic deposit in the right frontal parietal lobe which had increased in size since prior studies. No midline shift was noted. She was subsequently admitted onto medical service and Dr Mickeal Skinner of neuro-oncology was consulted who recommended IV Decadron 4mg  q12hr and to continue Keppra.   Medical oncology's plans for the patient are to hold systemic therapy for now  Since she has been in the hospital she reports that she has been slowly improving. She has been motivated increasingly with her physical rehabilitation. Her nurse reports that she was unable to lift her left arm or leg on admission, but this has now improved and she is able to now lift both of the extremities off of her bed somewhat. She still has persistent weakness of her left hand and fingers. She would like to speak with our chaplain. Previously, she was also noted to have a cough and poor appetite, but this seems to be improving as well throughout her admission.       ALLERGIES:  is allergic to codeine; penicillins; sulfa antibiotics; and tetracyclines & related.  Meds: No current facility-administered medications for this visit.    No current outpatient prescriptions on file.   Facility-Administered Medications Ordered in Other Visits  Medication Dose Route Frequency Provider Last Rate Last Dose  . acetaminophen (TYLENOL) tablet 325-650 mg  325-650 mg Oral Q4H PRN Love, Pamela S, PA-C      . alum & mag hydroxide-simeth (MAALOX/MYLANTA) 200-200-20 MG/5ML  suspension 30 mL  30 mL Oral Q4H PRN Love, Pamela S, PA-C      . bisacodyl (DULCOLAX) suppository 10 mg  10 mg Rectal Daily PRN Love, Pamela S, PA-C      . dexamethasone (DECADRON) tablet 4 mg  4 mg Oral Daily Kirsteins, Luanna Salk, MD   4 mg at 01/25/17 0744  . diphenhydrAMINE (BENADRYL) 12.5 MG/5ML elixir 12.5-25 mg  12.5-25 mg Oral Q6H PRN Love, Pamela S, PA-C      . docusate sodium (COLACE)  capsule 100 mg  100 mg Oral QHS Love, Pamela S, PA-C      . escitalopram (LEXAPRO) tablet 20 mg  20 mg Oral Daily Bary Leriche, PA-C   20 mg at 01/25/17 0744  . guaiFENesin-dextromethorphan (ROBITUSSIN DM) 100-10 MG/5ML syrup 5-10 mL  5-10 mL Oral Q6H PRN Love, Pamela S, PA-C      . levETIRAcetam (KEPPRA) tablet 1,000 mg  1,000 mg Oral BID Love, Pamela S, PA-C   1,000 mg at 01/25/17 0745  . LORazepam (ATIVAN) tablet 0.5 mg  0.5 mg Oral QHS PRN Bary Leriche, PA-C   0.5 mg at 01/24/17 2009  . multivitamin with minerals tablet 1 tablet  1 tablet Oral Daily Bary Leriche, PA-C   1 tablet at 01/25/17 0744  . ondansetron (ZOFRAN) tablet 4 mg  4 mg Oral Q6H PRN Love, Pamela S, PA-C       Or  . ondansetron (ZOFRAN) injection 4 mg  4 mg Intravenous Q6H PRN Love, Pamela S, PA-C      . pantoprazole (PROTONIX) EC tablet 40 mg  40 mg Oral BID Love, Pamela S, PA-C   40 mg at 01/25/17 0744  . prochlorperazine (COMPAZINE) tablet 5-10 mg  5-10 mg Oral Q6H PRN Love, Pamela S, PA-C       Or  . prochlorperazine (COMPAZINE) injection 5-10 mg  5-10 mg Intramuscular Q6H PRN Love, Pamela S, PA-C       Or  . prochlorperazine (COMPAZINE) suppository 12.5 mg  12.5 mg Rectal Q6H PRN Love, Pamela S, PA-C      . protein supplement (PREMIER PROTEIN) liquid  2 oz Oral BID BM Love, Pamela S, PA-C      . protein supplement (RESOURCE BENEPROTEIN) powder 6 g  1 scoop Oral TID WC Kirsteins, Luanna Salk, MD      . simethicone [facility]) chewable tablet 80 mg  80 mg Oral QID PRN Love, Pamela S, PA-C      . sodium phosphate (FLEET) 7-19 GM/118ML enema 1 enema  1 enema Rectal Once PRN Love, Pamela S, PA-C      . traZODone (DESYREL) tablet 25-50 mg  25-50 mg Oral QHS PRN Love, Pamela S, PA-C   50 mg at 01/24/17 2009    Physical Findings:The patient is in no acute distress. Patient is alert and oriented. height is 5\' 10"  (1.778 m) and weight is 156 lb 12 oz (71.1 kg). Her oral temperature is 98.6 F (37 C). Her blood pressure is  115/70 and her pulse is 63. Her respiration is 16 and oxygen saturation is 95%. .  No significant changes. General: Alert and oriented, in no acute distress HEENT: Head is normocephalic. She is restricted in her ability to gaze laterally to the left. Oropharynx is clear. No oral thrush.  Neurologic: She can partially lift left leg against gravity, but it is a struggle.  She has a contracture of her left hand.    Lab Findings: Lab  Results  Component Value Date   WBC 5.0 01/25/2017   HGB 10.0 (L) 01/25/2017   HCT 30.6 (L) 01/25/2017   MCV 87.2 01/25/2017   PLT 120 (L) 01/25/2017    Radiographic Findings: Ct Head Wo Contrast  Result Date: 01/15/2017 CLINICAL DATA:  Metastatic  cancer.  Increased left-sided weakness EXAM: CT HEAD WITHOUT CONTRAST TECHNIQUE: Contiguous axial images were obtained from the base of the skull through the vertex without intravenous contrast. COMPARISON:  CT head 12/30/2016, MRI 12/20/2016 and 12/31/2016. FINDINGS: Brain: Hemorrhagic metastatic deposit in the right frontal parietal lobe has increased in size. There has been further hemorrhage since the prior studies. The lesion now measures 4.5 x 4.3 cm with surrounding edema. Local mass-effect without shift of the midline structures. Enhancing metastatic deposit in the left parietal lobe is not identified on CT without contrast but is seen on the prior MRI. No acute infarct. No other areas of hemorrhage. Negative for hydrocephalus or shift of the midline structures. Vascular: Negative for hyperdense vessel Skull: Negative Sinuses/Orbits: Negative Other: None IMPRESSION: 4.3 x 4.5 cm hemorrhagic metastatic deposit in the right frontal parietal lobe has increased in size since the prior studies. There has been further hemorrhage in the lesion. No shift of the midline structures. Additional metastatic deposit left parietal lobe not identified on CT. These results were called by telephone at the time of interpretation on  01/15/2017 at 2:21 pm to Dr. Davonna Belling , who verbally acknowledged these results. Electronically Signed   By: Franchot Gallo M.D.   On: 01/15/2017 14:18   Ct Head Wo Contrast  Result Date: 12/30/2016 CLINICAL DATA:  Patient had radiation therapy to the brain today and this evening noticed abnormal gait. Loss of cord nasion in the left leg. History of seizures and lung cancer with brain metastasis. EXAM: CT HEAD WITHOUT CONTRAST TECHNIQUE: Contiguous axial images were obtained from the base of the skull through the vertex without intravenous contrast. COMPARISON:  MRI brain 12/20/2016 FINDINGS: Brain: Mass lesion in the right posterior frontal lobe with central necrosis measuring 3.6 cm diameter. Mild surrounding vasogenic edema. 1.1 cm diameter mass lesion along the left medial parietal cortical gyrus. Both of these lesions can be seen on the previous MRI examination. Additional tiny lesion in the left parietal cortex is not identified at CT. There is mild effacement of right frontal sulci caused by the mass lesion but without change since the MRI. No midline shift. No abnormal extra-axial fluid collections. Gray-white matter junctions are distinct. Basal cisterns are not effaced. Ventricles are not dilated or effaced. No acute intracranial hemorrhage. Vascular: No hyperdense vessel or unexpected calcification. Skull: Normal. Negative for fracture or focal lesion. Sinuses/Orbits: No acute finding. Other: None. IMPRESSION: Known metastatic lesions identified in the right posterior frontal low measuring 3.6 cm diameter and in the left medial parietal lobe measuring 1.1 cm diameter. Mild vasogenic edema. No increase in mass effect and no midline shift. No acute intracranial hemorrhage. Electronically Signed   By: Lucienne Capers M.D.   On: 12/30/2016 22:21   Mr Jeri Cos MW Contrast  Result Date: 12/31/2016 CLINICAL DATA:  Ataxia. Rule out stroke. History of metastatic disease EXAM: MRI HEAD WITHOUT AND WITH  CONTRAST MRI CERVICAL SPINE WITHOUT AND WITH CONTRAST TECHNIQUE: Multiplanar, multiecho pulse sequences of the brain and surrounding structures, and cervical spine, to include the craniocervical junction and cervicothoracic junction, were obtained without and with intravenous contrast. CONTRAST:  15 mL MultiHance IV COMPARISON:  MRI head 12/20/2016 FINDINGS:  MRI HEAD FINDINGS Brain: Image quality degraded by motion Large enhancing metastatic deposit in the right parietal lobe shows interval growth now measuring 37 x 38 mm, compared with 30 x 34 mm previously. Mild surrounding edema unchanged. Moderate intra tumor hemorrhage unchanged. Hemorrhagic metastatic deposit left medial parietal lobe mildly larger now measuring 12 mm compared with 10.5 x 8.3 mm previously. Mild intra tumor hemorrhage unchanged. 4 mm left parietal cortical metastatic disease unchanged. Negative for hydrocephalus or midline shift. Mild mass-effect on the right ventricle due to the right parietal tumor. No acute ischemic infarction. Vascular: Normal arterial flow void Skull and upper cervical spine: Negative Sinuses/Orbits: Negative Other: None MRI CERVICAL SPINE FINDINGS Image quality degraded by significant motion. Alignment: Normal alignment.  Mild kyphosis Vertebrae: Negative for fracture or metastatic disease in the cervical spine Cord: Normal cord signal.  No enhancing lesion in the cord Posterior Fossa, vertebral arteries, paraspinal tissues: Negative Disc levels: Disc degeneration and spondylosis at C4-5, C5-6, and C6-7 causing foraminal narrowing bilaterally. No cord compression. IMPRESSION: Progression of metastatic disease in the parietal lobe bilaterally. Intra tumor hemorrhage is present as noted previously. No new metastatic disease in the brain. No acute infarct Negative for cervical spine metastatic disease.  No cord compression Image quality degraded by motion. Electronically Signed   By: Franchot Gallo M.D.   On: 12/31/2016  10:53   Mr Cervical Spine W Wo Contrast  Result Date: 12/31/2016 CLINICAL DATA:  Ataxia. Rule out stroke. History of metastatic disease EXAM: MRI HEAD WITHOUT AND WITH CONTRAST MRI CERVICAL SPINE WITHOUT AND WITH CONTRAST TECHNIQUE: Multiplanar, multiecho pulse sequences of the brain and surrounding structures, and cervical spine, to include the craniocervical junction and cervicothoracic junction, were obtained without and with intravenous contrast. CONTRAST:  15 mL MultiHance IV COMPARISON:  MRI head 12/20/2016 FINDINGS: MRI HEAD FINDINGS Brain: Image quality degraded by motion Large enhancing metastatic deposit in the right parietal lobe shows interval growth now measuring 37 x 38 mm, compared with 30 x 34 mm previously. Mild surrounding edema unchanged. Moderate intra tumor hemorrhage unchanged. Hemorrhagic metastatic deposit left medial parietal lobe mildly larger now measuring 12 mm compared with 10.5 x 8.3 mm previously. Mild intra tumor hemorrhage unchanged. 4 mm left parietal cortical metastatic disease unchanged. Negative for hydrocephalus or midline shift. Mild mass-effect on the right ventricle due to the right parietal tumor. No acute ischemic infarction. Vascular: Normal arterial flow void Skull and upper cervical spine: Negative Sinuses/Orbits: Negative Other: None MRI CERVICAL SPINE FINDINGS Image quality degraded by significant motion. Alignment: Normal alignment.  Mild kyphosis Vertebrae: Negative for fracture or metastatic disease in the cervical spine Cord: Normal cord signal.  No enhancing lesion in the cord Posterior Fossa, vertebral arteries, paraspinal tissues: Negative Disc levels: Disc degeneration and spondylosis at C4-5, C5-6, and C6-7 causing foraminal narrowing bilaterally. No cord compression. IMPRESSION: Progression of metastatic disease in the parietal lobe bilaterally. Intra tumor hemorrhage is present as noted previously. No new metastatic disease in the brain. No acute infarct  Negative for cervical spine metastatic disease.  No cord compression Image quality degraded by motion. Electronically Signed   By: Franchot Gallo M.D.   On: 12/31/2016 10:53   Mr Thoracic Spine W Wo Contrast  Result Date: 12/31/2016 CLINICAL DATA:  Metastatic disease.  Ataxia.  Pain EXAM: MRI THORACIC WITHOUT AND WITH CONTRAST TECHNIQUE: Multiplanar and multiecho pulse sequences of the thoracic spine were obtained without and with intravenous contrast. CONTRAST:  15 mL MultiHance IV COMPARISON:  CT  chest 11/02/2016 FINDINGS: MRI THORACIC SPINE FINDINGS Image quality degraded by significant motion. Alignment:  Normal Vertebrae: Negative for vertebral body fracture. Bony metastatic disease is present however small lesions could be missed given the amount of motion. Metastatic disease in the left pedicles of T6 and T7 as well as in the left seventh rib with associated soft tissue mass. Metastatic disease in the posterior elements of T12 with mild posterior epidural tumor. Epidural tumor is causing mild spinal stenosis and displacement of the cord anteriorly. Tumor extends into the right pedicle and vertebral body of T12 Cord:  Negative for cord compression.  Cord signal normal. Paraspinal and other soft tissues: Bony metastatic disease as above. Right lower lobe lung mass as noted on prior CT. Disc levels: Negative for disc degeneration or disc protrusion. IMPRESSION: Bony metastatic disease as above. 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 Negative for cord compression.  No pathologic fracture. Electronically Signed   By: Franchot Gallo M.D.   On: 12/31/2016 10:39    Impression/Plan: Bleeding metastatic disease in the brain. Patient will proceed with neurologic / physical rehabilitation.  Most of the visit today was spent on moral support for Ms. Truman Hayward.  Nursing will ask our chaplain to visit her.  There is no role for radiotherapy at this time. Continue medical management of symptoms and  rehabilitation.  She appreciated the visit today and I will see her back in my clinic next month.   Eppie Gibson, MD

## 2017-01-25 NOTE — Progress Notes (Signed)
Nutrition Follow-up  DOCUMENTATION CODES:   Non-severe (moderate) malnutrition in context of chronic illness  INTERVENTION:  Continue Premier Protein po BID, each container provides 160 kcal and 30 grams of protein.  Continue Beneprotein 1 scoop TID with meals, each scoop provides 25 kcal and 6 grams of protein.  Encourage adequate PO intake.   NUTRITION DIAGNOSIS:   Malnutrition (moderate) related to chronic illness, catabolic illness, cancer and cancer related treatments as evidenced by mild depletion of muscle mass, mild depletion of body fat, percent weight loss (9.9% in 1 month); ongoing  GOAL:   Patient will meet greater than or equal to 90% of their needs; met  MONITOR:   PO intake, Weight trends, Labs  REASON FOR ASSESSMENT:   Malnutrition Screening Tool    ASSESSMENT:   Pt with PMH of breast cancer relapse 10/2016 revealing metastasis. 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  Meal completion has been varied from 25-100%. Premier Protein and Beneprotein has been ordered via PA. Pt has been consuming the nutritional supplements. RD to continue with current orders to aid in adequate nutrition needs. Labs and medications reviewed.    Diet Order:  Diet regular Room service appropriate? Yes; Fluid consistency: Thin  Skin:  Reviewed, no issues  Last BM:  10/3  Height:   Ht Readings from Last 1 Encounters:  01/22/17 5' 10" (1.778 m)    Weight:   Wt Readings from Last 1 Encounters:  01/16/17 156 lb 12 oz (71.1 kg)    Ideal Body Weight:  68.2 kg  BMI:  There is no height or weight on file to calculate BMI.  Estimated Nutritional Needs:   Kcal:  0258-5277  Protein:  107-120 grams  Fluid:  >/= 2.1 L/d  EDUCATION NEEDS:   No education needs identified at this time  Corrin Parker, MS, RD, LDN Pager #  [phone] After hours/ weekend pager # [phone]

## 2017-01-25 NOTE — Progress Notes (Signed)
Physical Therapy Session Note  Patient Details  Name: Kelly Stuart MRN: [id number] Date of Birth: 10/05/1945  Today's Date: 01/25/2017 PT Individual Time: 1130-1300 PT Individual Time Calculation (min): 90 min   Short Term Goals: Week 1:  PT Short Term Goal 1 (Week 1): Pt will be able to perform functional transfer with mod assist PT Short Term Goal 2 (Week 1): Pt will be able to propel w/c with hemi-technique with min assist PT Short Term Goal 3 (Week 1): Pt will be able to gait x 30' with max assist of 1 (w/c follow for safety) PT Short Term Goal 4 (Week 1): Pt will be able to manage LUE during transfers with mod verbal cues  Skilled Therapeutic Interventions/Progress Updates:    no c/o pain.  Session focus on seating and positioning evaluation, visual scanning, and activity tolerance.   Pt participated in w/c evaluation to determine positioning system to increase independence and decrease burden of care.  Pt transfers throughout session with max assist for squat/pivot to R and L.  Dynavision x3 trials mode B for visual scanning, attention, and fine motor coordination with results as below. VCs for second and third trial for visual scanning to L.  Pt demos significant difficulty finding targets in L upper quadrant.   1) 8 hits out of 15 2) 11 hits out of 16 3) 13 hits out of 17  Pt returned to room at end of session and positioned in recliner with max assist.  PT provided education regarding w/c evaluation with pt and sister.  Discussed w/c level mobility at home and need for ramp.  SW in room at end of session.   Therapy Documentation Precautions:  Precautions Precautions: Fall, Other (comment) Precaution Comments: decreased attention to left; CA; h/o seizures Restrictions Weight Bearing Restrictions: No   See Function Navigator for Current Functional Status.   Therapy/Group: Individual Therapy  Michel Santee 01/25/2017, 1:42 PM

## 2017-01-25 NOTE — Progress Notes (Signed)
Social Work Patient ID: Kelly Stuart, female   DOB: [birth year], 71 y.o.   MRN: [id number]  Met with [name] to discuss team conference goals min-mod assist level and target discharge date 10/12. Both felt she would be here three weeks. Sister wants her to get as much rehab to be able to go through the cancer treatments she has ahead of her. Sister wants to make sure they can manage her and hire assist If needed. She has been told the new treatment will begin in five days and feels this may impact her with her endurance and fatigue. Will ask MD to contact her and work on discharge needs. [facility] was here today And did the wheelchair evaluation to get her a better fitting chair for home. Sister aware to begin coming in for education and making sure they can provide the care her sister needs at discharge. Will continue to work On discharge plans.

## 2017-01-25 NOTE — Patient Care Conference (Signed)
Inpatient RehabilitationTeam Conference and Plan of Care Update Date: 01/25/2017   Time: 11:20 AM    Patient Name: Kelly Stuart      Medical Record Number: [id number]  Date of Birth: 08-23-1945 Sex: Female         Room/Bed: 4M09C/4M09C-01 Payor Info: Payor: MEDICARE / Plan: MEDICARE PART A AND B / Product Type: *No Product type* /    Admitting Diagnosis: Debility  Admit Date/Time:  01/18/2017  5:15 PM Admission Comments: No comment available   Primary Diagnosis:  Metastatic cancer to brain Kelly Stuart) Principal Problem: Metastatic cancer to brain Kelly Stuart)  Patient Active Problem List   Diagnosis Date Noted  . Metastatic cancer to brain (Kelly Stuart) 01/18/2017  . Malnutrition of moderate degree 01/17/2017  . Hemorrhage, intracerebral (Kelly Stuart) 01/16/2017  . 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 Left hemiplegia (Kelly Stuart) 01/15/2017  . Acute left-sided weakness 01/15/2017  . Hemiparesis of left nondominant side due to non-cerebrovascular etiology (Kelly Stuart)   . Fatigue 01/06/2017  . Focal seizures (Kelly Stuart) 12/30/2016  . 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 Spine metastasis (Kelly Stuart) 12/14/2016  . Brain metastases (Kelly Stuart) 12/13/2016  . Metastatic lung cancer (metastasis from lung to other site), unspecified laterality (Kelly Stuart) 11/28/2016  . Lung nodules 11/14/2016  . Primary cancer of upper outer quadrant of left female breast (Kelly Stuart) 05/05/2011    Expected Discharge Date: Expected Discharge Date: 02/04/17  Team Members Present: Physician leading conference: Dr. Alysia Penna Social Worker Present: Ovidio Kin, LCSW Nurse Present: Rayetta Pigg, RN PT Present: Dwyane Dee, PT OT Present: Cherylynn Ridges, OT SLP Present: Weston Anna, SLP PPS Coordinator present : Daiva Nakayama, RN, CRRN     Current Status/Progress Goal Weekly Team Focus  Medical   Poor progress, some minimal left elbow flexion, leans toward the left, blood pressure is running on the low side  Maintain medical stability to  complete inpatient rehabilitation program  Increase endurance, improve sitting balance   Bowel/Bladder   Continent of bowel & bladder with episodes, LBM 01/23/17, diarrhea, miralax discontinued, on colace at this time  continence  continue to assist as needed   Swallow/Nutrition/ Hydration             ADL's   Max A overall  Min A overall  L NMR, L side attention, transfer training, postural control, modified bathing/dressing   Mobility   max overall  min overall  transfers, gait as able, coordination, motor planning, NMR, balance, safety awareness   Communication             Safety/Cognition/ Behavioral Observations            Pain   no c/o pain, has tylenol prn  pain scale <3  continue to assess & treat as needed   Skin   sporadic bruising that is fading, slightly reddened buttocks, especially after diarrhea, has skin barrier  no new areas of skin breakdown  assess q shift      *See Care Plan and progress notes for long and short-term goals.     Barriers to Discharge  Current Status/Progress Possible Resolutions Date Resolved   Physician    Medical stability;Pending chemo/radiation  May be having palliative chemotherapy  No progress towards goals  Family training will be emphasized      Nursing                  PT  OT                  SLP                SW Decreased caregiver support Unsure if sister can provide the level of care pt will require at DC            Discharge Planning/Teaching Needs:  Plan home with sister who made aware pt will require 24 hr physical care. Will need to have her come in when appropriate and see if she can provide this level of care to pt.      Team Discussion:  Goals-min-mod level of assist. L-inattention and neglect limits her, getting better about looking left. Needs to slow down. WC evaluation today. Working on sitting balance. Neuro-psych seeing for coping. Speech eval pending.Checking shoulder x-ray to see if can  do e-stim. MD contacting oncologist regarding next treatment plan. Will need to do multiple days of family education prior to discharge home   Revisions to Treatment Plan:  Made need to downgrade goals if DC 10/13    Continued Need for Acute Rehabilitation Level of Care: The patient requires daily medical management by a physician with specialized training in physical medicine and rehabilitation for the following conditions: Daily direction of a multidisciplinary physical rehabilitation program to ensure safe treatment while eliciting the highest outcome that is of practical value to the patient.: Yes Daily medical management of patient stability for increased activity during participation in an intensive rehabilitation regime.: Yes Daily analysis of laboratory values and/or radiology reports with any subsequent need for medication adjustment of medical intervention for : Other;Neurological problems  [name], Gardiner Rhyme 01/26/2017, 12:12 PM

## 2017-01-25 NOTE — Progress Notes (Signed)
Occupational Therapy Session Note  Patient Details  Name: Kelly Stuart MRN: [id number] Date of Birth: 1945/10/23  Today's Date: 01/25/2017  Session 1 OT Individual Time: 0801-0900 OT Individual Time Calculation (min): 59 min   Session 2 OT Individual Time: 8101-7510 OT Individual Time Calculation (min): 71 min   Short Term Goals: Week 1:  OT Short Term Goal 1 (Week 1): Pt will recall hemi-dressing techniques 2 consecutive days with min questioning cues OT Short Term Goal 2 (Week 1): Pt will look past midline to left to locate 3/4 grooming items on L side of sink OT Short Term Goal 3 (Week 1): Pt will maintian midline orientation in sitting with MIN A  Skilled Therapeutic Interventions/Progress Updates:  Session 1   Focus on L side attention, functional use of L UE, sitting balance, and standing balance. Pt completed squat-pivot transfer to R with facilitation at L knee and max A to pivot. W/C brought to the sink and provided hand over hand A to integrate L UE to wring out wash cloth using both hands, then facilitated pushing and pulling with L UE to wash R arm and underarm. Sit<>stand with max A + L knee block then facilitated weight bearing through L UE on counter top. Pt needed assistance to pronate L wrist for weight bearing through palm. Pt then needed max A to maintain standing balance while reaching to wash buttocks and peri-area 2/2 lateral lean to L and L knee buckle. Reviewed hemi-dressing techniques. Pt needed instructional cues during task to complete even though she could verbally state the steps. Pt able to thread L UE with min A 2/2 long sleeve, assistance to locate opening of shirt, then able to thread R UE and pull over head. Utilized Pharmacist, hospital to show pt she needed to pull shirt down over L shoulder. LB dressing with assistance to place L LE into ffigure 4 position. OT oriented pants for pt and demonstrated technique to make sure toes go through pants. Blocked practice  threading LLE x3. Pt left seated in wc at end of session with safety belt on and L UE supported.   Session 2 OT treatment session focused on wc propulsion, transfer training, L attention, lateral scooting, and L NMR. Pt greeted asleep in recliner with sister present. Pt completed squat-pivot to wc on R side with max A and bobath method to maintain anterior weight shift. Educated pt on wc propulsion using hemi techniques. Pt able to propel wc well forward, but had difficulty scanning L to locate obstacles and turns. Blocked transfer training using slideboard from wc<>therapy mat. Demonstrated technique for maintaining anterior weight shift throughout transfer. Pt needed instructional cues to slow down and scoot little by little, instead of "flinging" herself. Max A initially for slide board and scooting along edge of mat, progressing to mod A with repetition. Pt needed continued facilitation at L knee to prevent extension, but was able to better maintain forward weight shift. L NMR in sitting with joint input through L wrist and elbow to promote elbow flex/ext and shoulder flex/ext. Utilzed NDT techniques and mirror feedback to promote midline posture. Scavenger hunt in hallway with only objects on L side with pt needing mod instructional cues to locate items. Pt left seated in recliner at end of session with needs met and sister present.  Therapy Documentation Precautions:  Precautions Precautions: Fall, Other (comment) Precaution Comments: decreased attention to left; CA; h/o seizures Restrictions Weight Bearing Restrictions: No Pain:  none/denies pain ADL: ADL ADL  Comments: Please see functional navigator  See Function Navigator for Current Functional Status.   Therapy/Group: Individual Therapy  Valma Cava 01/25/2017, 3:27 PM

## 2017-01-25 NOTE — Consult Note (Signed)
Neuropsychological Consultation   Patient:   Kelly Stuart   DOB:   1945-06-12  MR Number:  [id number]  Location:  [place] [address] [facility] [address] [id number] [place] [address] Dept: [place]: [phone]           Date of Service:   01/25/2017  Start Time:   9 AM End Time:   10 AM  Provider/Observer:  Ilean Skill, Psy.D.       Clinical Neuropsychologist       Billing Code/Service: [phone] Units  Chief Complaint:    Kelly Stuart is a 71 year old female with prior history of breast cancer and lumpectomy on 03/14/2012.  Caner relapse in 10/2016 with metastasis in multi sites including brain metastasis.  MRI showed hemorrhagic deposit right frontoparetal lobe and left medial parietal lobe.  The patient has been trying to cope with the return of her cancer with metastasis as well as impact brain involvement has produced from hemorrhage lesions.  Stress, worry and coping issues noted.  Reason for Service:  Kelly Stuart was referred for neuropsychological consultation due to ongoing issues with coping and dealing with cognitive and psychomotor changes as well as overall existential issues.  Below is the HPI for the current admission.  Kelly Stuart, Kelly Stuart, Kelly Stuart, Kelly Stuart, Kelly and left thigh and hemorrhagic deposit right frontoparietal lobe and left medial parietal lobe. She underwent stereotactic radiosurgery right frontal lobe by Dr. Rita Ohara and has completed XRT to brain. She was admitted for focal motor seizures affecting LUE/LLE and was started on Keppra as well as steroids on 9/9. She started developing progressive left sided weakness on 9/21 that progressed to diffuse weakness and left inattention. She had decreased  keppra to 500 mg bid due to lethargy and questioned weakness due to post-ictal state. As symptoms did not improve, she presented to ED on 9/23 for work up. . CT head done revealing increase in size of hemorrhagic deposit right frontal lobe with further hemorrhage in lesion. She was started on IV decadron 4 mg bid with improvement. Dr. Mickeal Skinner evaluated patient and recommended continuing steroids bid as edema and hemorrhagic conversion felt to be likely be due to NSAIDs and recent radiotherapy. No further work up needed and plans for systemic therapy in the future. Therapy recommended by MD and rehab team due to significant deficits in mobility and ability to carry out ADL tasks.   Current Status:  The patient reports that she is having lots of visitors, which helps her from spending time in her own head and having "a pity party."  She reports that she is trying to stay as positive as she can.  Behavioral Observation: Kelly Stuart  presents as a 70 y.o.-year-old Right Caucasian Female who appeared her stated age. her dress was Appropriate and she was Well Groomed and her manners were Appropriate to the situation.  her participation was indicative of Appropriate behaviors.  There were any physical disabilities noted.  she displayed an appropriate level of cooperation and motivation.     Interactions:    Active Appropriate  Attention:   abnormal and attention span appeared shorter than expected for age  Memory:   abnormal; remote memory intact, recent memory impaired  Visuo-spatial:  within normal limits  Speech (Volume):  low  Speech:   normal;   Thought Process:  Coherent and Relevant  Though Content:  WNL;   Orientation:   person, place, time/date and situation  Judgment:   Good  Planning:   Fair  Affect:    Flat and Lethargic  Mood:    Dysphoric  Insight:   Good  Intelligence:   high  Medical History:   Past Medical History:  Diagnosis Date  . Anxiety   . Brain cancer  ([place])   . Breast cancer ([place])    ER+PR+ HER-2NEU negative  . Lung cancer [facility])        Family Med/Psych History:  Family History  Problem Relation Age of Onset  . Cancer Mother   . Colon cancer Mother   . Cancer Father   . Colon cancer Father   . Hepatitis Father   . Hiatal hernia Father   . Rheumatic fever Sister   . Colon cancer Paternal Aunt     Risk of Suicide/Violence: low The patient denies SI or HI.  Impression/DX:  Kelly Stuart is a 71 year old female with prior history of breast cancer and lumpectomy on 03/14/2012.  Caner relapse in 10/2016 with metastasis in multi sites including brain metastasis.  MRI showed hemorrhagic deposit right frontoparetal lobe and left medial parietal lobe.  The patient has been trying to cope with the return of her cancer with metastasis as well as impact brain involvement has produced from hemorrhage lesions.  Stress, worry and coping issues noted.  The patient reports that she is having lots of visitors, which helps her from spending time in her own head and having "a pity party."  She reports that she is trying to stay as positive as she can.  The patient appears to be dealing with adjustment issues, but not a full blown clinical depression.  She has good support system and good internal coping resources.  Disposition/Plan:  Will see the patient again first of next week.  Diagnosis:    Metastatic cancer of brain.  Adjustment issues        Electronically Signed   _______________________ Ilean Skill, Psy.D.

## 2017-01-26 ENCOUNTER — Inpatient Hospital Stay (HOSPITAL_COMMUNITY): Payer: Medicare Other

## 2017-01-26 ENCOUNTER — Inpatient Hospital Stay (HOSPITAL_COMMUNITY): Payer: Self-pay | Admitting: Physical Therapy

## 2017-01-26 ENCOUNTER — Inpatient Hospital Stay (HOSPITAL_COMMUNITY): Payer: Medicare Other | Admitting: Occupational Therapy

## 2017-01-26 ENCOUNTER — Inpatient Hospital Stay (HOSPITAL_COMMUNITY): Payer: Medicare Other | Admitting: Speech Pathology

## 2017-01-26 DIAGNOSIS — I8290 Acute embolism and thrombosis of unspecified vein: Secondary | ICD-10-CM

## 2017-01-26 HISTORY — PX: IR IVC FILTER PLMT / S&I /IMG GUID/MOD SED: IMG701

## 2017-01-26 LAB — URINALYSIS, ROUTINE W REFLEX MICROSCOPIC
BILIRUBIN URINE: NEGATIVE
GLUCOSE, UA: NEGATIVE mg/dL
Hgb urine dipstick: NEGATIVE
KETONES UR: NEGATIVE mg/dL
LEUKOCYTES UA: NEGATIVE
NITRITE: NEGATIVE
PH: 8 (ref 5.0–8.0)
PROTEIN: NEGATIVE mg/dL
Specific Gravity, Urine: 1.005 (ref 1.005–1.030)

## 2017-01-26 LAB — GLUCOSE, CAPILLARY: GLUCOSE-CAPILLARY: 114 mg/dL — AB (ref 65–99)

## 2017-01-26 MED ORDER — SODIUM CHLORIDE 0.9 % IV BOLUS (SEPSIS)
500.0000 mL | Freq: Once | INTRAVENOUS | Status: AC
  Administered 2017-01-26: 500 mL via INTRAVENOUS

## 2017-01-26 MED ORDER — SODIUM CHLORIDE 0.9 % IV BOLUS (SEPSIS): 500.0000 mL | Freq: Once | INTRAVENOUS | Status: DC

## 2017-01-26 MED ORDER — SODIUM CHLORIDE 0.9 % IV SOLN
75.0000 mL/h | INTRAVENOUS | Status: AC
  Administered 2017-01-26: 75 mL/h via INTRAVENOUS

## 2017-01-26 MED ORDER — IOPAMIDOL (ISOVUE-300) INJECTION 61%
INTRAVENOUS | Status: AC
  Administered 2017-01-26: 65 mL
  Filled 2017-01-26: qty 100

## 2017-01-26 MED ORDER — MUSCLE RUB 10-15 % EX CREA
TOPICAL_CREAM | Freq: Three times a day (TID) | CUTANEOUS | Status: DC
  Administered 2017-01-26 – 2017-01-28 (×7): via TOPICAL
  Filled 2017-01-26: qty 85

## 2017-01-26 MED ORDER — LIDOCAINE HCL 1 % IJ SOLN
INTRAMUSCULAR | Status: DC | PRN
  Administered 2017-01-26: 5 mL

## 2017-01-26 MED ORDER — POTASSIUM CHLORIDE CRYS ER 20 MEQ PO TBCR
20.0000 meq | EXTENDED_RELEASE_TABLET | Freq: Once | ORAL | Status: AC
  Administered 2017-01-26: 20 meq via ORAL
  Filled 2017-01-26: qty 1

## 2017-01-26 MED ORDER — TRAZODONE HCL 50 MG PO TABS: 25.0000 mg | ORAL_TABLET | Freq: Every evening | ORAL | Status: DC | PRN

## 2017-01-26 MED ORDER — LIDOCAINE HCL 1 % IJ SOLN
INTRAMUSCULAR | Status: AC
  Filled 2017-01-26: qty 20

## 2017-01-26 MED ORDER — ACETAMINOPHEN 325 MG PO TABS
650.0000 mg | ORAL_TABLET | Freq: Two times a day (BID) | ORAL | Status: DC
  Administered 2017-01-26 – 2017-01-28 (×5): 650 mg via ORAL
  Filled 2017-01-26 (×5): qty 2

## 2017-01-26 NOTE — Significant Event (Signed)
Benjamine Mola, OT reports change in mental status for patient.  She assisted patient in eating and noticed her pocketing in her left cheek and drooling out of the left side of her mouth.  She reports the patient is also more lethargic than usual.  RN notified Algis Liming, PA of findings.  Brita Romp, RN

## 2017-01-26 NOTE — Progress Notes (Signed)
Referring Physician(s): Dr. Alysia Penna  Supervising Physician: Daryll Brod  Patient Status: [facility] - In-pt  Chief Complaint: LLE DVT  Subjective: This patient has a history of breast cancer and now with lung cancer with mets to the brain and spine.  She has been admitted secondary to left hemiparesis secondary to right frontal metastasis.  She has now been found to have an extensive LLE DVT from fem to pop and all the way down.  She denies any symptoms or pain.  Given her inability to be anti-coagulated, an IVC filter has currently been requested.  Allergies: Codeine; Penicillins; Sulfa antibiotics; and Tetracyclines & related  Medications: Prior to Admission medications   Medication Sig Start Date End Date Taking? Authorizing Provider  acetaminophen (TYLENOL) 325 MG tablet Take 2 tablets (650 mg total) by mouth every 6 (six) hours as needed for mild pain (or Fever >/= 101). 01/18/17  Yes Robbie Lis, MD  amLODipine (NORVASC) 5 MG tablet Take 5 mg by mouth daily. 11/04/16  Yes [provider]  dexamethasone (DECADRON) 4 MG tablet Take 1 tablet (4 mg total) by mouth every 12 (twelve) hours. 01/18/17  Yes Robbie Lis, MD  escitalopram (LEXAPRO) 20 MG tablet Take 20 mg by mouth daily.   Yes [provider]  ibuprofen (ADVIL,MOTRIN) 200 MG tablet Take 200 mg by mouth 2 (two) times daily as needed for headache or moderate pain.   Yes [provider]  levETIRAcetam (KEPPRA) 1000 MG tablet Take 1 tablet (1,000 mg total) by mouth 2 (two) times daily. 01/18/17  Yes Robbie Lis, MD  pantoprazole (PROTONIX) 40 MG tablet Take 1 tablet (40 mg total) by mouth 2 (two) times daily. 01/18/17  Yes Robbie Lis, MD  protein supplement shake (PREMIER PROTEIN) LIQD Take 325 mLs (11 oz total) by mouth daily. 01/18/17  Yes Robbie Lis, MD    Vital Signs: BP 112/65 (BP Location: Left Arm)   Pulse 86   Temp 97.9 F (36.6 C) (Oral)   Resp 18   Ht 5\' 10"  (1.778  m)   LMP 08/23/2008   SpO2 95%   Physical Exam: Heart: regular, with occasional PVCs Lungs: CTAB Ext: no significant increase in edema of the LLE, nontender to touch.  Imaging: Dg Chest 2 View  Result Date: 01/26/2017 CLINICAL DATA:  Lethargy today. 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 Former smoker. EXAM: CHEST  2 VIEW COMPARISON:  Chest x-ray of July 2nd 2018 FINDINGS: The right hemidiaphragm is elevated as compared to the left. There is persistent abnormal density in the right perihilar region. On the left there is no focal infiltrate or evidence of a parenchymal mass. The heart is top-normal in size. The pulmonary vascularity is normal. The mediastinum is normal in width. IMPRESSION: New elevation of the right hemidiaphragm. Stable parenchymal density in the right mid lung consistent with known metastatic disease. No CHF nor pneumonia. Electronically Signed   By: David  Martinique M.D.   On: 01/26/2017 13:30   Dg Shoulder Left  Result Date: 01/25/2017 CLINICAL DATA:  History of left shoulder subluxation. 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 EXAM: LEFT SHOULDER - 2+ VIEW COMPARISON:  Chest x-ray dated October 24, 2016 which included portions of the left shoulder. FINDINGS: The bones are subjectively adequately mineralized. The glenohumeral joint space is well maintained. The subacromial subdeltoid space is normal. As best as can  be determined the acromioclavicular joint is also normal. There is no evidence of acute or old fracture. IMPRESSION: There is no acute or significant chronic bony abnormality of the left shoulder. Electronically Signed   By: David  Martinique M.D.   On: 01/25/2017 16:20    Labs:  CBC:  Recent Labs  01/15/17 1521 01/17/17 0423 01/19/17 0520 01/25/17 0652  WBC 4.8 7.9 6.3 5.0  HGB 13.2 11.5* 10.8* 10.0*  HCT 40.4 34.3* 33.9* 30.6*  PLT 184 176 165 120*    COAGS:  Recent Labs   11/21/16 1038  INR 1.15    BMP:  Recent Labs  01/15/17 1521 01/17/17 0423 01/19/17 0520 01/25/17 0652  NA 135 135 134* 133*  K 4.2 3.9 3.9 3.6  CL 101 105 103 101  CO2 26 23 23 23   GLUCOSE 118* 153* 134* 104*  BUN 8 11 14 13   CALCIUM 9.2 8.9 8.7* 8.4*  CREATININE 0.51 0.49 0.58 0.60  GFRNONAA >60 >60 >60 >60  GFRAA >60 >60 >60 >60    LIVER FUNCTION TESTS:  Recent Labs  11/21/16 1038 01/15/17 1521 01/19/17 0520  BILITOT 1.1 1.0 0.7  AST 24 45* 21  ALT 13* 51 23  ALKPHOS 87 119 74  PROT 7.0 7.6 6.2*  ALBUMIN 3.4* 3.6 2.7*    Assessment and Plan: 1. Extensive LLE DVT  Given the patient's history of brain mets, etc, the patient is unable to be currently anticoagulated for her LLE DVT.  We will plan to place an IVC filter as a preventative measure.  The patient request no medications for sedation or pain.  We will proceed with just local. Risks and benefits discussed with the patient including, but not limited to bleeding, infection, contrast induced renal failure, filter fracture or migration which can lead to emergency surgery or even death, strut penetration with damage or irritation to adjacent structures and caval thrombosis. All of the patient's questions were answered, patient is agreeable to proceed. Consent signed and in chart.   Electronically Signed: Henreitta Cea 01/26/2017, 4:22 PM   I spent a total of 25 Minutes at the the patient's bedside AND on the patient's hospital floor or unit, greater than 50% of which was counseling/coordinating care for LLE DVT

## 2017-01-26 NOTE — Progress Notes (Signed)
Patient and sister aware of results. Patient feeling better with improvement in BP with fluid bolus. CXR without acute changes. UA negative for infection. Discussed patient with Dr. Lindi Adie. Dr. Lindi Adie concurs that patient at high risk for re-bleeding and recommends a filter at this time. He feels that she may be stable to start anticoagulation in about a month.

## 2017-01-26 NOTE — Evaluation (Signed)
Speech Language Pathology Assessment and Plan  Patient Details  Name: Kelly Stuart MRN: [id number] Date of Birth: May 08, 1945  SLP Diagnosis: Cognitive Impairments  Rehab Potential: Good ELOS: 02/04/17    Today's Date: 01/26/2017 SLP Individual Time: 8916-9450 SLP Individual Time Calculation (min): 45 min   Problem List:  Patient Active Problem List   Diagnosis Date Noted  . Metastatic cancer to brain ([place]) 01/18/2017  . Malnutrition of moderate degree 01/17/2017  . Hemorrhage, intracerebral ([place]) 01/16/2017  . 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 Left hemiplegia ([place]) 01/15/2017  . Acute left-sided weakness 01/15/2017  . 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 Fatigue 01/06/2017  . Focal seizures ([place]) 12/30/2016  . 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 Brain metastases ([place]) 12/13/2016  . Metastatic lung cancer (metastasis from lung to other site), unspecified laterality ([place]) 11/28/2016  . Lung nodules 11/14/2016  . Primary cancer of upper outer quadrant of left female breast ([place]) 05/05/2011   Past Medical History:  Past Medical History:  Diagnosis Date  . Anxiety   . Brain cancer ([place])   . Breast cancer ([place])    ER+PR+ HER-2NEU negative  . Lung cancer [facility])    Past Surgical History:  Past Surgical History:  Procedure Laterality Date  . BREAST LUMPECTOMY WITH NEEDLE LOCALIZATION  02/29/2012   Procedure: BREAST LUMPECTOMY WITH NEEDLE LOCALIZATION;  Surgeon: Rolm Bookbinder, MD;  Location: [place];  Service: General;  Laterality: Left;  . BREAST SURGERY    . COSMETIC SURGERY    . DILATION AND CURETTAGE OF UTERUS  1998   submucus myoma-resected  . EYE SURGERY    . FACELIFT W/BLEPHAROPLASTY  1999      . maxillofacial surgery  1980   mouth-ealign teeth-  . minifacelift  2007  . RADIOACTIVE SEED GUIDED EXCISIONAL BREAST BIOPSY N/A 02/11/2016   Procedure:  LEFT BREAST RADIOACTIVE SEED GUIDED EXCISIONAL BIOPSY;  Surgeon: Rolm Bookbinder, MD;  Location: [place];  Service: General;  Laterality: N/A;  . TONSILLECTOMY      Assessment / Plan / Recommendation Clinical Impression Patient was administered a cognitive-linguistic evaluation per OT/PT request due to reports of cognitive impairments. SLP administered the MoCA (version 7.3). Patient scored 17/30 points with a score of 26 or above considered normal. Patient demonstrated deficits in sustained attention, attention to left field of environment, recall, awareness, problem solving, reasoning, executive functioning and visuospatial skills. Patient was also exteremly lethargic throughout session which impacted her overall function. Patient would benefit from skilled SLP intervention to maximize her cognitive function and overall functional independence prior to discharge home.    Skilled Therapeutic Interventions          Administered a cognitive-linguistic evaluation. Please see above for details. Patient educated in regards to current cognitive impairments and goals of skilled SLP intervention, she verbalized understanding. Patient extremely lethargic throughout session, RN aware. Patient transferred back to bed with total +2 A and was asleep before clinician could leave room. Patient missed remaining 15 minutes of session.   SLP Assessment  Patient will need skilled [place] [facility] Services during CIR admission    Recommendations  Oral Care Recommendations: Oral care BID Recommendations for Other Services: Neuropsych consult Patient destination: Home Follow up Recommendations: Home Health SLP;24 hour supervision/assistance;None;Outpatient SLP Equipment Recommended: None recommended by SLP    SLP Frequency 3 to 5 out of 7 days   SLP  Duration  SLP Intensity  SLP Treatment/Interventions 02/04/17  Minumum of 1-2 x/day, 30 to 90 minutes  Cognitive  remediation/compensation;Cueing hierarchy;Environmental controls;Internal/external aids;Therapeutic Activities;Patient/family education;Functional tasks    Pain No/Denies Pain    Function:   Cognition Comprehension Comprehension assist level: Understands basic 75 - 89% of the time/ requires cueing 10 - 24% of the time  Expression   Expression assist level: Expresses basic 90% of the time/requires cueing < 10% of the time.  Social Interaction Social Interaction assist level: Interacts appropriately 75 - 89% of the time - Needs redirection for appropriate language or to initiate interaction.  Problem Solving Problem solving assist level: Solves basic 50 - 74% of the time/requires cueing 25 - 49% of the time  Memory Memory assist level: Recognizes or recalls 50 - 74% of the time/requires cueing 25 - 49% of the time   Short Term Goals: Week 1: SLP Short Term Goal 1 (Week 1): STG=LTGs  Refer to Care Plan for Long Term Goals  Recommendations for other services: Neuropsych  Discharge Criteria: Patient will be discharged from SLP if patient refuses treatment 3 consecutive times without medical reason, if treatment goals not met, if there is a change in medical status, if patient makes no progress towards goals or if patient is discharged from hospital.  The above assessment, treatment plan, treatment alternatives and goals were discussed and mutually agreed upon: by patient  Mateen Franssen 01/26/2017, 1:36 PM

## 2017-01-26 NOTE — Procedures (Signed)
ACUTE LLE DVT, HEMORRHAGIC BRAIN MET  S/P IVC FILTER NO COMP STABLE EBL 0 FULL REPORT IN PACS

## 2017-01-26 NOTE — Significant Event (Signed)
Vascular lab called to report an extensive DVT involving entire left leg.  Algis Liming, PA notified by RN.  Ordered to place patient on bedrest for now.  Pam to assess patient and provide further orders for management.  Brita Romp, RN

## 2017-01-26 NOTE — Significant Event (Signed)
Kelly Danker, PA from interventional radiology present to obtain consent for IVC filter placement.  Patient visibly anxious, with sisters at bedside.  Patient agreed to proceed with IVC placement, which can be done immediately.  Patient discussed anxiety with radiology PA and she was ok with the patient getting ativan 0.5 mg orally prior to the procedure.  Patient will not be having any sedation, just local anesthetic.  Patient has an existing order for ativan 0.5mg  orally at bedtime as needed for anxiety.  As to not delay procedure, RN retrieved ativan and administered to patient as an override.  Attempted to call Kelly Liming, PA to inform her of procedure being done today, no answer, left message.  Transport here to take patient down for procedure.  Kelly Romp, RN

## 2017-01-26 NOTE — Progress Notes (Signed)
Patient reported to be different today--pocketing, fatigued and increased lean to left.  On exam speech is clear and she is oriented X 3, able to follow commands without difficulty. She reports feeling off --tired and sleep full of dreams. Back hurting due to all the therapy yesterday. Has been getting trazodone 50 mg at bedtime--nothing for pain. SBP in 80's and could be contributing to symptoms with hypoperfusion. Will bolus with 500 cc and start IV to help with hypotension. Monitor for improvement or other neuro changes. Will decrease trazodone to 25 mg prn and schedule tylenol to help with acute on chronic back pain --used NSAIDs at home.

## 2017-01-26 NOTE — Progress Notes (Signed)
Subjective/Complaints:   Pt working with OT this am, no shoulder pain, trying to sit during meal, some spillage, left sideof  mouth  ROS: pt denies nausea, vomiting, diarrhea, cough, shortness of breath or chest pain   Objective: Vital Signs: Blood pressure (!) 97/50, pulse 93, temperature 98.5 F (36.9 C), temperature source Oral, resp. rate 20, height 5' 10" (1.778 m), last menstrual period 08/23/2008, SpO2 96 %. Dg Shoulder Left  Result Date: 01/25/2017 CLINICAL DATA:  History of left shoulder subluxation. 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 EXAM: LEFT SHOULDER - 2+ VIEW COMPARISON:  Chest x-ray dated October 24, 2016 which included portions of the left shoulder. FINDINGS: The bones are subjectively adequately mineralized. The glenohumeral joint space is well maintained. The subacromial subdeltoid space is normal. As best as can be determined the acromioclavicular joint is also normal. There is no evidence of acute or old fracture. IMPRESSION: There is no acute or significant chronic bony abnormality of the left shoulder. Electronically Signed   By: David  Martinique M.D.   On: 01/25/2017 16:20   Results for orders placed or performed during the hospital encounter of 01/18/17 (from the past 72 hour(s))  Glucose, capillary     Status: Abnormal   Collection Time: 01/25/17  6:41 AM  Result Value Ref Range   Glucose-Capillary 110 (H) 65 - 99 mg/dL  Basic metabolic panel     Status: Abnormal   Collection Time: 01/25/17  6:52 AM  Result Value Ref Range   Sodium 133 (L) 135 - 145 mmol/L   Potassium 3.6 3.5 - 5.1 mmol/L   Chloride 101 101 - 111 mmol/L   CO2 23 22 - 32 mmol/L   Glucose, Bld 104 (H) 65 - 99 mg/dL   BUN 13 6 - 20 mg/dL   Creatinine, Ser 0.60 0.44 - 1.00 mg/dL   Calcium 8.4 (L) 8.9 - 10.3 mg/dL   GFR calc non Af Amer >60 >60 mL/min   GFR calc Af Amer >60 >60 mL/min    Comment: (NOTE) The eGFR has been calculated using the CKD EPI  equation. This calculation has not been validated in all clinical situations. eGFR's persistently <60 mL/min signify possible Chronic Kidney Disease.    Anion gap 9 5 - 15  CBC with Differential/Platelet     Status: Abnormal   Collection Time: 01/25/17  6:52 AM  Result Value Ref Range   WBC 5.0 4.0 - 10.5 K/uL   RBC 3.51 (L) 3.87 - 5.11 MIL/uL   Hemoglobin 10.0 (L) 12.0 - 15.0 g/dL   HCT 30.6 (L) 36.0 - 46.0 %   MCV 87.2 78.0 - 100.0 fL   MCH 28.5 26.0 - 34.0 pg   MCHC 32.7 30.0 - 36.0 g/dL   RDW 15.8 (H) 11.5 - 15.5 %   Platelets 120 (L) 150 - 400 K/uL   Neutrophils Relative % 67 %   Neutro Abs 3.3 1.7 - 7.7 K/uL   Lymphocytes Relative 18 %   Lymphs Abs 0.9 0.7 - 4.0 K/uL   Monocytes Relative 12 %   Monocytes Absolute 0.6 0.1 - 1.0 K/uL   Eosinophils Relative 3 %   Eosinophils Absolute 0.1 0.0 - 0.7 K/uL   Basophils Relative 0 %   Basophils Absolute 0.0 0.0 - 0.1 K/uL  Glucose, capillary     Status: Abnormal   Collection Time: 01/26/17  5:45 AM  Result Value Ref Range   Glucose-Capillary 114 (  H) 65 - 99 mg/dL     HEENT: normal Cardio: RRR without murmur. No JVD   Resp: CTA Bilaterally without wheezes or rales. Normal effort   GI: BS positive and NT, ND Extremity:  Pulses positive and No Edema Skin:   Intact Neuro: Alert/Oriented, Abnormal Sensory Reduced on Left and Abnormal Motor 2- L biceps, 0 left delt, triceps, finger flex/ext, 2- hip/knee ext synergy, no evidence of flexor withdrawl spasticity Musc/Skel:  Other no pain with UE or LE ROM. No LBP currently Gen NAD   Assessment/Plan: 1. Functional deficits secondary to RIght frontal metastasis breast CA primary which require 3+ hours per day of interdisciplinary therapy in a comprehensive inpatient rehab setting. Physiatrist is providing close team supervision and 24 hour management of active medical problems listed below. Physiatrist and rehab team continue to assess barriers to discharge/monitor patient progress  toward functional and medical goals. FIM: Function - Bathing Position: Wheelchair/chair at sink Body parts bathed by patient: Left arm, Chest, Abdomen, Front perineal area, Right upper leg, Left upper leg, Right lower leg, Left lower leg Body parts bathed by helper: Right arm, Buttocks Assist Level: Touching or steadying assistance(Pt > 75%) Assistive Device Comment: long handled sponge and wash mit   Function- Upper Body Dressing/Undressing What is the patient wearing?: Pull over shirt/dress Pull over shirt/dress - Perfomed by patient: Thread/unthread right sleeve, Put head through opening, Pull shirt over trunk Pull over shirt/dress - Perfomed by helper: Thread/unthread left sleeve Assist Level: Touching or steadying assistance(Pt > 75%) Function - Lower Body Dressing/Undressing What is the patient wearing?: Pants, Non-skid slipper socks, Shoes Position: Wheelchair/chair at sink Underwear - Performed by patient: Thread/unthread right underwear leg Underwear - Performed by helper: Thread/unthread left underwear leg, Pull underwear up/down Pants- Performed by patient: Thread/unthread right pants leg Pants- Performed by helper: Pull pants up/down, Thread/unthread left pants leg Non-skid slipper socks- Performed by patient: Don/doff right sock, Don/doff left sock Non-skid slipper socks- Performed by helper: Don/doff right sock, Don/doff left sock Shoes - Performed by patient: Don/doff right shoe Shoes - Performed by helper: Don/doff left shoe, Fasten right, Fasten left TED Hose - Performed by helper: Don/doff left TED hose, Don/doff right TED hose Assist for footwear: Maximal assist Assist for lower body dressing: Touching or steadying assistance (Pt > 75%)  Function - Toileting Toileting steps completed by patient: Performs perineal hygiene Toileting steps completed by helper: Adjust clothing prior to toileting, Adjust clothing after toileting Toileting Assistive Devices: Other  (comment) (stedy) Assist level: Two helpers  Function - Air cabin crew transfer assistive device: Elevated toilet seat/BSC over toilet, Mechanical lift Mechanical lift: Stedy Assist level to toilet: Moderate assist (Pt 50 - 74%/lift or lower) Assist level from toilet: Moderate assist (Pt 50 - 74%/lift or lower)  Function - Chair/bed transfer Chair/bed transfer method: Squat pivot Chair/bed transfer assist level: Maximal assist (Pt 25 - 49%/lift and lower) Chair/bed transfer assistive device: Armrests Mechanical lift: Stedy Chair/bed transfer details: Manual facilitation for weight shifting, Manual facilitation for placement, Verbal cues for sequencing, Verbal cues for technique, Verbal cues for precautions/safety  Function - Locomotion: Wheelchair Will patient use wheelchair at discharge?: Yes Type: Manual Max wheelchair distance: 100' Assist Level: Maximal assistance (Pt 25 - 49%) Wheel 50 feet with 2 turns activity did not occur: Safety/medical concerns Wheel 150 feet activity did not occur: Safety/medical concerns Turns around,maneuvers to table,bed, and toilet,negotiates 3% grade,maneuvers on rugs and over doorsills: No Function - Locomotion: Ambulation Assistive device: Lite gait Max distance:  88 Assist level: 2 helpers Assist level: 2 helpers Walk 50 feet with 2 turns activity did not occur: Safety/medical concerns Assist level: 2 helpers Walk 150 feet activity did not occur: Safety/medical concerns Walk 10 feet on uneven surfaces activity did not occur: Safety/medical concerns  Function - Comprehension Comprehension: Auditory Comprehension assist level: Follows basic conversation/direction with no assist  Function - Expression Expression: Verbal Expression assist level: Expresses basic needs/ideas: With no assist  Function - Social Interaction Social Interaction assist level: Interacts appropriately with others with medication or extra time (anti-anxiety,  antidepressant).  Function - Problem Solving Problem solving assist level: Solves basic problems with no assist  Function - Memory Memory assist level: More than reasonable amount of time Patient normally able to recall (first 3 days only): Current season, Location of own room, Staff names and faces, That he or she is in a hospital  Medical Problem List and Plan: 1. 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 DVT Prophylaxis/Anticoagulation: Mechanical: Sequential compression devices, below knee Bilateral lower extremities  -adjusted left SCD (tightened wrap). If still not giving same effect, we can change out 3. Pain Management: tylenol prn + kpad 4. Mood: Stable on home dose Lexapro with ativan at nights to help with sleep/anxiety. Sister supportive and mood appears stable. LCSW to follow for evaluation and support.  5. Neuropsych: This patient iscapable of making decisions on herown behalf. 6. Skin/Wound Care: routine pressure relief measures. Maintain adequate nutritional and hydration status.  7. Fluids/Electrolytes/Nutrition: Monitor I/O. CMET nl 9/27 except low alb, po intake 329m on 10/1  -continue to encourage po,normal BMET 8. 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  9. 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  10 GERD: due to steroids. Managed with PPI.  11. Right frontoparietal metastatic disease: Hemorrhage and increase in edema treated with decadron     -- 12. HTN: Normotensive off meds , check BMET Monitor BP bid.  Vitals:   01/26/17 0543 01/26/17 0548  BP: (!) 89/51 (!) 97/50  Pulse:  93  Resp:  20  Temp:  98.5 F (36.9 C)  SpO2:  96%   13. Constipation: H . BM  10/3  LOS (Days) 8 A FACE TO FACE EVALUATION WAS PERFORMED  KIRSTEINS,ANDREW E 01/26/2017, 8:03 AM

## 2017-01-26 NOTE — Progress Notes (Signed)
Patient returned from IR after IVC filter placement.  Patient alert and oriented, denies pain.  VSS.  Dressing to right neck clean, dry, and intact.  Sisters at bedside to assist patient in eating dinner.  Brita Romp, RN

## 2017-01-26 NOTE — Progress Notes (Signed)
Patient arrived back to floor from radiology.  Brita Romp, RN

## 2017-01-26 NOTE — Progress Notes (Signed)
Physical Therapy Session Note  Patient Details  Name: Kelly Stuart MRN: [id number] Date of Birth: 1946-01-08  Today's Date: 01/26/2017 PT Individual Time: 1100-1200 PT Individual Time Calculation (min): 60 min   Short Term Goals: Week 1:  PT Short Term Goal 1 (Week 1): Pt will be able to perform functional transfer with mod assist PT Short Term Goal 2 (Week 1): Pt will be able to propel w/c with hemi-technique with min assist PT Short Term Goal 3 (Week 1): Pt will be able to gait x 30' with max assist of 1 (w/c follow for safety) PT Short Term Goal 4 (Week 1): Pt will be able to manage LUE during transfers with mod verbal cues  Skilled Therapeutic Interventions/Progress Updates:    no c/o pain, pt sleeping soundly initially but awakes easily and agreeable to therapy.  BP in sitting 86/50.  Supine>sit with mod assist for trunk elevation and pt able to sit EOB 30 minutes with overall supervision, occasional min guard for safety.  Stand/pivot to and from [facility] with max assist. Pt requires assist for clothing management but completes hygiene in sitting with supervision.  Seated EOB focus on trunk stretches, 3 minutes extended stretch to L/R trunk, forward flexion, and supine pec stretch.  Pt positioned to comfort at end of session with call bell in reach, asleep before therapist exited room.   Therapy Documentation Precautions:  Precautions Precautions: Fall, Other (comment) Precaution Comments: decreased attention to left; CA; h/o seizures Restrictions Weight Bearing Restrictions: No   See Function Navigator for Current Functional Status.   Therapy/Group: Individual Therapy  Michel Santee 01/26/2017, 12:42 PM

## 2017-01-26 NOTE — Progress Notes (Signed)
Social Work Kelly Stuart, Kelly Stuart Social Worker Signed   Patient Care Conference Date of Service: 01/25/2017  3:43 PM      Hide copied text Hover for attribution information Inpatient RehabilitationTeam Conference and Plan of Care Update Date: 01/25/2017   Time: 11:20 AM      Patient Name: Kelly Stuart      Medical Record Number: [id number]  Date of Birth: May 08, 1945 Sex: Female         Room/Bed: 4M09C/4M09C-01 Payor Info: Payor: MEDICARE / Plan: MEDICARE PART A AND B / Product Type: *No Product type* /     Admitting Diagnosis: Debility  Admit Date/Time:  01/18/2017  5:15 PM Admission Comments: No comment available    Primary Diagnosis:  Metastatic cancer to brain [facility]) Principal Problem: Metastatic cancer to brain [facility])       Patient Active Problem List    Diagnosis Date Noted  . Metastatic cancer to brain ([place]) 01/18/2017  . Malnutrition of moderate degree 01/17/2017  . Hemorrhage, intracerebral ([place]) 01/16/2017  . 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 Left hemiplegia ([place]) 01/15/2017  . Acute left-sided weakness 01/15/2017  . 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 Fatigue 01/06/2017  . Focal seizures ([place]) 12/30/2016  . 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 Brain metastases ([place]) 12/13/2016  . Metastatic lung cancer (metastasis from lung to other site), unspecified laterality ([place]) 11/28/2016  . Lung nodules 11/14/2016  . Primary cancer of upper outer quadrant of left female breast ([place]) 05/05/2011      Expected Discharge Date: Expected Discharge Date: 02/04/17   Team Members Present: Physician leading conference: Dr. Alysia Stuart Social Worker Present: Kelly Kin, LCSW Nurse Present: Kelly Pigg, RN PT Present: Kelly Stuart, PT OT Present: Kelly Stuart, OT SLP Present: Kelly Stuart, SLP PPS Coordinator present : Kelly Nakayama, RN, CRRN       Current  Status/Progress Goal Weekly Team Focus  Medical     Poor progress, some minimal left elbow flexion, leans toward the left, blood pressure is running on the low side  Maintain medical stability to complete inpatient rehabilitation program  Increase endurance, improve sitting balance   Bowel/Bladder     Continent of bowel & bladder with episodes, LBM 01/23/17, diarrhea, miralax discontinued, on colace at this time  continence  continue to assist as needed   Swallow/Nutrition/ Hydration               ADL's     Max A overall  Min A overall  L NMR, L side attention, transfer training, postural control, modified bathing/dressing   Mobility     max overall  min overall  transfers, gait as able, coordination, motor planning, NMR, balance, safety awareness   Communication               Safety/Cognition/ Behavioral Observations             Pain     no c/o pain, has tylenol prn  pain scale <3  continue to assess & treat as needed   Skin     sporadic bruising that is fading, slightly reddened buttocks, especially after diarrhea, has skin barrier  no new areas of skin breakdown  assess q shift     *See Care Plan and progress notes for long and short-term goals.      Barriers to Discharge   Current Status/Progress Possible Resolutions Date Resolved  Physician     Medical stability;Pending chemo/radiation  May be having palliative chemotherapy  No progress towards goals  Family training will be emphasized      Nursing                 PT                    OT                 SLP            SW Decreased caregiver support Unsure if sister can provide the level of care pt will require at DC             Discharge Planning/Teaching Needs:  Plan home with sister who made aware pt will require 24 hr physical care. Will need to have her come in when appropriate and see if she can provide this level of care to pt.      Team Discussion:  Goals-min-mod level of assist. L-inattention and  neglect limits her, getting better about looking left. Needs to slow down. WC evaluation today. Working on sitting balance. Neuro-psych seeing for coping. Speech eval pending.Checking shoulder x-ray to see if can do e-stim. MD contacting oncologist regarding next treatment plan. Will need to do multiple days of family education prior to discharge home   Revisions to Treatment Plan:  Made need to downgrade goals if DC 10/13    Continued Need for Acute Rehabilitation Level of Care: The patient requires daily medical management by a physician with specialized training in physical medicine and rehabilitation for the following conditions: Daily direction of a multidisciplinary physical rehabilitation program to ensure safe treatment while eliciting the highest outcome that is of practical value to the patient.: Yes Daily medical management of patient stability for increased activity during participation in an intensive rehabilitation regime.: Yes Daily analysis of laboratory values and/or radiology reports with any subsequent need for medication adjustment of medical intervention for : Other;Neurological problems   Kelly Stuart 01/26/2017, 12:12 PM           Patient ID: Kelly Stuart, female   DOB: 16-Jun-1945, 71 y.o.   MRN: [id number]

## 2017-01-26 NOTE — Significant Event (Signed)
IV obtained in Right AC on second attempt.  IVF bolus now infusing.  Patient completing cognitive evaluation with SLP now.  Increased drooling noted, different symptom for patient.  Notified Algis Liming, PA.  She recommends we continue with IVF bolus, recheck blood pressure after completion and see how she feels at lunch. Will continue to monitor.  Brita Romp, RN

## 2017-01-26 NOTE — Progress Notes (Signed)
Patient states she is feeling much better.  Patients bed was soaked with urine after coming back from radiology.  Informed patient we needed to obtain urine sample for testing.  Intermittent catheterization performed to obtain the best sample.  Changed patients linen and placed external female catheter on patient since she is now on bedrest.  Algis Liming, PA at bedside discussing test results with patient and sister.  Will continue to monitor.  Brita Romp, RN

## 2017-01-26 NOTE — Progress Notes (Signed)
Occupational Therapy Session Note  Patient Details  Name: Kelly Stuart MRN: [id number] Date of Birth: 17-Jun-1945  Today's Date: 01/26/2017 OT Individual Time: 0730-0900 OT Individual Time Calculation (min): 90 min    Short Term Goals: Week 1:  OT Short Term Goal 1 (Week 1): Pt will recall hemi-dressing techniques 2 consecutive days with min questioning cues OT Short Term Goal 2 (Week 1): Pt will look past midline to left to locate 3/4 grooming items on L side of sink OT Short Term Goal 3 (Week 1): Pt will maintian midline orientation in sitting with MIN A  Skilled Therapeutic Interventions/Progress Updates:    Pt greeted In bed, stated she felt a little foggy headed today, but agreeable to therapy. Pt transferred to sitting EOB with Min A to advance LLE and min A to elevate trunk. Addressed sitting balance, L attention, self-feeding, and weight bearing through  L UE while eating breakfast at EOB. Pt able to achieve sitting balance with intermittent min A and 50% supervision. Pt with improved reaction to right herself when drifting L. Noted some pocketing of food during breakfast with pt stating " I feel like food grows while I,m  Eating," verbal cues to finish chewing 2/2 attention deficits. Noted spillage from L side of mouth without awareness. Possibly 2/2 increased task demand of sitting up while also eating breakfast. Pt transferred EOB> wc using slide board with Max A to maintain anterior weight shift and facilitation at L knee and hip. Wc brought to the sink for bathing/dressing.Sit<>stand with Max A + L knee block, then pt able to wash buttocks with instructional cues and max  A for balance.  Pt lethargic with very slow cognition today. She needed max instructional cues to initiate and sequence each step of bathing and dressing, which is more cognitive assistance than usual. Pt also drooling much more today from L side of mouth. Pt stated she feels like she is slow today and having difficulty  thinking. Informed RN of pt status and pt left seated in wc with safety belt on, L UE supported on 1/2 lap tray, and needs met.  Therapy Documentation Precautions:  Precautions Precautions: Fall, Other (comment) Precaution Comments: decreased attention to left; CA; h/o seizures Restrictions Weight Bearing Restrictions: No ADL: ADL ADL Comments: Please see functional navigator  See Function Navigator for Current Functional Status.   Therapy/Group: Individual Therapy  Daneen Schick Seba Madole 01/26/2017, 1:00 PM

## 2017-01-26 NOTE — Progress Notes (Signed)
*  PRELIMINARY RESULTS* Vascular Ultrasound Left lower extremity venous duplex has been completed.  Preliminary findings: Extensive DVT noted through the left femoral, popliteal, posterior tibial, and peroneal veins. Intramuscular thrombosis noted in the left gastroc and soleal veins.  Can compare to exam on 01/20/17.  Called results to [place], RN   Landry Mellow, RDMS, RVT  01/26/2017, 1:03 PM

## 2017-01-27 ENCOUNTER — Ambulatory Visit: Payer: Medicare Other | Admitting: Rehabilitative and Restorative Service Providers"

## 2017-01-27 ENCOUNTER — Inpatient Hospital Stay (HOSPITAL_COMMUNITY): Payer: Medicare Other | Admitting: Physical Therapy

## 2017-01-27 ENCOUNTER — Ambulatory Visit: Payer: Medicare Other | Admitting: Internal Medicine

## 2017-01-27 ENCOUNTER — Inpatient Hospital Stay (HOSPITAL_COMMUNITY): Payer: Medicare Other | Admitting: Occupational Therapy

## 2017-01-27 ENCOUNTER — Inpatient Hospital Stay (HOSPITAL_COMMUNITY): Payer: Medicare Other | Admitting: Speech Pathology

## 2017-01-27 ENCOUNTER — Encounter (HOSPITAL_COMMUNITY): Payer: Self-pay | Admitting: Interventional Radiology

## 2017-01-27 DIAGNOSIS — I82412 Acute embolism and thrombosis of left femoral vein: Secondary | ICD-10-CM

## 2017-01-27 DIAGNOSIS — R5383 Other fatigue: Secondary | ICD-10-CM

## 2017-01-27 DIAGNOSIS — G8190 Hemiplegia, unspecified affecting unspecified side: Secondary | ICD-10-CM

## 2017-01-27 DIAGNOSIS — M8588 Other specified disorders of bone density and structure, other site: Secondary | ICD-10-CM

## 2017-01-27 LAB — CBC WITH DIFFERENTIAL/PLATELET
Basophils Absolute: 0 10*3/uL (ref 0.0–0.1)
Basophils Relative: 0 %
EOS ABS: 0.1 10*3/uL (ref 0.0–0.7)
EOS PCT: 2 %
HCT: 31.2 % — ABNORMAL LOW (ref 36.0–46.0)
Hemoglobin: 10 g/dL — ABNORMAL LOW (ref 12.0–15.0)
Lymphocytes Relative: 13 %
Lymphs Abs: 0.8 10*3/uL (ref 0.7–4.0)
MCH: 28 pg (ref 26.0–34.0)
MCHC: 32.1 g/dL (ref 30.0–36.0)
MCV: 87.4 fL (ref 78.0–100.0)
MONO ABS: 0.6 10*3/uL (ref 0.1–1.0)
MONOS PCT: 10 %
NEUTROS PCT: 75 %
Neutro Abs: 4.6 10*3/uL (ref 1.7–7.7)
PLATELETS: 124 10*3/uL — AB (ref 150–400)
RBC: 3.57 MIL/uL — AB (ref 3.87–5.11)
RDW: 15.4 % (ref 11.5–15.5)
WBC: 6.1 10*3/uL (ref 4.0–10.5)

## 2017-01-27 LAB — URINE CULTURE: CULTURE: NO GROWTH

## 2017-01-27 LAB — BASIC METABOLIC PANEL
Anion gap: 4 — ABNORMAL LOW (ref 5–15)
BUN: 8 mg/dL (ref 6–20)
CO2: 23 mmol/L (ref 22–32)
CREATININE: 0.53 mg/dL (ref 0.44–1.00)
Calcium: 8.1 mg/dL — ABNORMAL LOW (ref 8.9–10.3)
Chloride: 106 mmol/L (ref 101–111)
GFR calc Af Amer: >60 mL/min (ref >60)
GLUCOSE: 111 mg/dL — AB (ref 65–99)
Potassium: 3.4 mmol/L — ABNORMAL LOW (ref 3.5–5.1)
SODIUM: 133 mmol/L — AB (ref 135–145)

## 2017-01-27 LAB — GLUCOSE, CAPILLARY: GLUCOSE-CAPILLARY: 103 mg/dL — AB (ref 65–99)

## 2017-01-27 MED ORDER — POTASSIUM CHLORIDE CRYS ER 20 MEQ PO TBCR
20.0000 meq | EXTENDED_RELEASE_TABLET | Freq: Two times a day (BID) | ORAL | Status: DC
  Administered 2017-01-27 – 2017-01-28 (×3): 20 meq via ORAL
  Filled 2017-01-27 (×3): qty 1

## 2017-01-27 MED ORDER — CYANOCOBALAMIN 1000 MCG/ML IJ SOLN
1000.0000 ug | INTRAMUSCULAR | Status: DC
  Administered 2017-01-27: 1000 ug via INTRAMUSCULAR
  Filled 2017-01-27: qty 1

## 2017-01-27 NOTE — Progress Notes (Signed)
Physical Therapy Session Note  Patient Details  Name: Kelly Stuart MRN: [id number] Date of Birth: July 23, 1945  Today's Date: 01/27/2017 PT Individual Time: 1101-1202 PT Individual Time Calculation (min): 61 min   Short Term Goals: Week 1:  PT Short Term Goal 1 (Week 1): Pt will be able to perform functional transfer with mod assist PT Short Term Goal 2 (Week 1): Pt will be able to propel w/c with hemi-technique with min assist PT Short Term Goal 3 (Week 1): Pt will be able to gait x 30' with max assist of 1 (w/c follow for safety) PT Short Term Goal 4 (Week 1): Pt will be able to manage LUE during transfers with mod verbal cues  Skilled Therapeutic Interventions/Progress Updates:    Pt in bed upon arrival, agreeable to PT session. TE: supine LLE SAQ, dorsiflexion stretch, heel slides with focus on LE control. NRE: sitting EOB with focus on sitting balance. Sitting in recliner with cues for posture and perturbations as tolerated. Working also on finding and maintaining midline. TFA: Sit<>stand performed using stedy per pt request. Pt with heavy push toward Lt side even with use of stedy. Transferred to toilet to void. Requiring sit<>stand X2 to complete donning briefs and transition to recliner. Following session, pt up in recliner with all needs in reach and quick release belt on.   Therapy Documentation Precautions:  Precautions Precautions: Fall, Other (comment) Precaution Comments: decreased attention to left; CA; h/o seizures Restrictions Weight Bearing Restrictions: No   Pain: Denies pain  See Function Navigator for Current Functional Status.   Therapy/Group: Individual Therapy  Linard Millers, PT 01/27/2017, 3:52 PM

## 2017-01-27 NOTE — Progress Notes (Signed)
Occupational Therapy Weekly Progress Note  Patient Details  Name: Kelly Stuart MRN: [id number] Date of Birth: 02/13/1946  Beginning of progress report period: January 19, 2017 End of progress report period: January 27, 2017  Patient has met 2 of 3 short term goals. Pt is making slow, but steady progress towards OT goals at this time. She experienced a set-back yesterday with more lethargy likely 2/2 medication, and was also found to have DVT which an IVC filter was placed. Up until yesterday, pt has been progressing with BADL tasks. Pt is able to recall hemi dressing techniques and has improved L UE activation with more proximal return.   Patient continues to demonstrate the following deficits: muscle weakness, abnormal tone, unbalanced muscle activation, decreased coordination and decreased motor planning, decreased midline orientation, decreased attention to left and left side neglect, decreased initiation, decreased attention, decreased awareness, decreased problem solving and delayed processing and decreased sitting balance, decreased standing balance, decreased postural control, hemiplegia and decreased balance strategies and therefore will continue to benefit from skilled OT intervention to enhance overall performance with BADL and Reduce care partner burden.  Patient progressing toward long term goals..  Continue plan of care.  OT Short Term Goals Week 1:  OT Short Term Goal 1 (Week 1): Pt will recall hemi-dressing techniques 2 consecutive days with min questioning cues OT Short Term Goal 1 - Progress (Week 1): Met OT Short Term Goal 2 (Week 1): Pt will look past midline to left to locate 3/4 grooming items on L side of sink OT Short Term Goal 2 - Progress (Week 1): Progressing toward goal OT Short Term Goal 3 (Week 1): Pt will maintian midline orientation in sitting with MIN A OT Short Term Goal 3 - Progress (Week 1): Met Week 2:  OT Short Term Goal 1 (Week 2): Pt will look past  midline to left to locate 2/4 grooming items on L side of sink with min questioning cues OT Short Term Goal 2 (Week 2): Pt will maintain midline while donning shirt with min A OT Short Term Goal 3 (Week 2): Pt will complete toilet or BSC transfer with Mod A to decreased caregiver burder   See Function Navigator for Current Functional Status.   Therapy/Group: Individual Therapy  Valma Cava 01/27/2017, 2:26 PM

## 2017-01-27 NOTE — Progress Notes (Signed)
HEMATOLOGY-ONCOLOGY PROGRESS NOTE  SUBJECTIVE: Left-sided hemiplegia, recent diagnosis of DVT status post IVC filter Patient is very motivated to get stronger and improve her strength. She resumed physical therapy today once again  OBJECTIVE: REVIEW OF SYSTEMS:   Constitutional: Denies fevers, chills or abnormal weight loss Eyes: Left eyelid lag Ears, nose, mouth, throat, and face: Denies mucositis or sore throat Respiratory: Denies cough, dyspnea or wheezes Cardiovascular: Denies palpitation, chest discomfort Gastrointestinal:  Episodes of diarrhea following constipation treatment Skin: Denies abnormal skin rashes Neurological: Left-sided motor and sensory weakness Behavioral/Psych: Mood is stable, no new changes  Extremities: Left lower extremity edema All other systems were reviewed with the patient and are negative.  I have reviewed the past medical history, past surgical history, social history and family history with the patient and they are unchanged from previous note.   PHYSICAL EXAMINATION: ECOG PERFORMANCE STATUS: 2 - Symptomatic, <50% confined to bed  Vitals:   01/26/17 2140 01/27/17 0335  BP: (!) 90/58 (!) 90/48  Pulse: 78 79  Resp: 20 18  Temp: 97.9 F (36.6 C) 99.1 F (37.3 C)  SpO2: 95% 91%   There were no vitals filed for this visit.  GENERAL:alert, no distress and comfortable SKIN: skin color, texture, turgor are normal, no rashes or significant lesions EYES: normal, Conjunctiva are pink and non-injected, sclera clear OROPHARYNX:no exudate, no erythema and lips, buccal mucosa, and tongue normal  NECK: supple, thyroid normal size, non-tender, without nodularity LYMPH:  no palpable lymphadenopathy in the cervical, axillary or inguinal LUNGS: clear to auscultation and percussion with normal breathing effort HEART: regular rate & rhythm and no murmurs and no lower extremity edema ABDOMEN:abdomen soft, non-tender and normal bowel sounds Musculoskeletal:no  cyanosis of digits and no clubbing  NEURO: alert & oriented x 3 with fluent speech,Profound weakness of left upper and lower extremities along with sensory deficit  LABORATORY DATA:  I have reviewed the data as listed CMP Latest Ref Rng & Units 01/27/2017 01/25/2017 01/19/2017  Glucose 65 - 99 mg/dL 111(H) 104(H) 134(H)  BUN 6 - 20 mg/dL 8 13 14   Creatinine 0.44 - 1.00 mg/dL 0.53 0.60 0.58  Sodium 135 - 145 mmol/L 133(L) 133(L) 134(L)  Potassium 3.5 - 5.1 mmol/L 3.4(L) 3.6 3.9  Chloride 101 - 111 mmol/L 106 101 103  CO2 22 - 32 mmol/L 23 23 23   Calcium 8.9 - 10.3 mg/dL 8.1(L) 8.4(L) 8.7(L)  Total Protein 6.5 - 8.1 g/dL - - 6.2(L)  Total Bilirubin 0.3 - 1.2 mg/dL - - 0.7  Alkaline Phos 38 - 126 U/L - - 74  AST 15 - 41 U/L - - 21  ALT 14 - 54 U/L - - 23    Lab Results  Component Value Date   WBC 6.1 01/27/2017   HGB 10.0 (L) 01/27/2017   HCT 31.2 (L) 01/27/2017   MCV 87.4 01/27/2017   PLT 124 (L) 01/27/2017   NEUTROABS 4.6 01/27/2017    ASSESSMENT AND PLAN: 1. 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 She'll be presented at the tumor board next Monday. Patient is extremely motivated to get stronger and is working hard with physical therapy. 2. left femoral, popliteal, posterior tibial and peroneal vein DVT: Because of the intracranial bleed, she started candidate for anticoagulation. Status post IVC filter placement. 3. Metastatic lung cancer: 1.8 similar spiculated nodule right lower lobe, right hilar to mediastinal adenopathy, bilateral adrenal masses and lytic lesions of T12, left posterior eighth rib: Her last CT  scans were done in July 2018. I would like to obtain another CT scan to get a new baseline. 4. Fatigue: I will request B-12 injections to be given once a week. I have requested for Tagrisso to be obtained for her metastatic lung cancer. As soon as becomes available she can get started while she is in  rehabilitation.  We will follow her up on Monday after the brain tumor board discussion.

## 2017-01-27 NOTE — Progress Notes (Addendum)
Patient has had a good day today.  She appears to be in high spirits after IVC filter placement yesterday evening.  She states she feels much better, "I am much more like myself today."  Patient with increased appetite, eating more of her meals today.  Patient participating in therapy with sister, Judson Roch, at the bedside.  Brita Romp, RN

## 2017-01-27 NOTE — Discharge Instructions (Signed)
Inpatient Rehab Discharge Instructions  Kelly Stuart Discharge date and time:    Activities/Precautions/ Functional Status: Activity: no lifting, driving, or strenuous exercise till cleared by MD Diet: regular diet Wound Care: none needed   Functional status:  ___ No restrictions     ___ Walk up steps independently _X__ 24/7 supervision/assistance   ___ Walk up steps with assistance ___ Intermittent supervision/assistance  ___ Bathe/dress independently ___ Walk with walker     _X__ Bathe/dress with assistance ___ Walk Independently    ___ Shower independently ___ Walk with assistance    ___ Shower with assistance _X__ No alcohol     ___ Return to work/school ________  Special Instructions:    My questions have been answered and I understand these instructions. I will adhere to these goals and the provided educational materials after my discharge from the hospital.  Patient/Caregiver Signature _______________________________ Date __________  Clinician Signature _______________________________________ Date __________  Please bring this form and your medication list with you to all your follow-up doctor's appointments.

## 2017-01-27 NOTE — Progress Notes (Signed)
Much more alert this morning, without complaint of.Patrici Ranks A

## 2017-01-27 NOTE — Progress Notes (Signed)
Physical Therapy Session Note  Patient Details  Name: Kelly Stuart MRN: [id number] Date of Birth: 12/19/1945  Today's Date: 01/27/2017 PT Individual Time: 1545-1630 PT Individual Time Calculation (min): 45 min   Short Term Goals: Week 1:  PT Short Term Goal 1 (Week 1): Pt will be able to perform functional transfer with mod assist PT Short Term Goal 2 (Week 1): Pt will be able to propel w/c with hemi-technique with min assist PT Short Term Goal 3 (Week 1): Pt will be able to gait x 30' with max assist of 1 (w/c follow for safety) PT Short Term Goal 4 (Week 1): Pt will be able to manage LUE during transfers with mod verbal cues  Skilled Therapeutic Interventions/Progress Updates: Pt received seated in recliner with sister and oncologist present; oncologist requested time to finish meeting with pt. Missed 15 min therapy. Upon therapist's return pt agreeable to treatment. Performed squat pivot transfer to L side; required cues for head/hips relationship and maxA +2. Transfer to mat table modA +2 to R side. Sit >supine minA for LLE management. Supine LLE/LUE PNF D2 flexion/extension with rhythmic initiation PROM>AAROM>AROM>resisted AROM as pt improved with coordination/initiation into pattern. Pt requested to stretch BLEs; performed long sitting hamstring, hip ER stretches self-led with min cues for technique. Returned to w/c as above. Squat pivot w/c>recliner modA +2. Remained seated in recliner with family present at end of session, all needs in reach.      Therapy Documentation Precautions:  Precautions Precautions: Fall, Other (comment) Precaution Comments: decreased attention to left; CA; h/o seizures Restrictions Weight Bearing Restrictions: No  General: PT Missed Time: 15 min (meeting with oncologist) Pain: Pain Assessment Pain Assessment: 0-10 Pain Score: 0-No pain Pain Type: Chronic pain Pain Location: Back Pain Orientation: Mid Pain Descriptors / Indicators: Aching Pain  Onset: Gradual Patients Stated Pain Goal: 0 Pain Intervention(s): Medication (See eMAR)   See Function Navigator for Current Functional Status.   Therapy/Group: Individual Therapy  Luberta Mutter 01/27/2017, 4:16 PM

## 2017-01-27 NOTE — Progress Notes (Addendum)
Subjective/Complaints:  No issues overnite  ROS: pt denies nausea, vomiting, diarrhea, cough, shortness of breath or chest pain   Objective: Vital Signs: Blood pressure (!) 90/48, pulse 79, temperature 99.1 F (37.3 C), temperature source Oral, resp. rate 18, height '5\' 10"'  (1.778 m), last menstrual period 08/23/2008, SpO2 91 %. Dg Chest 2 View  Result Date: 01/26/2017 CLINICAL DATA:  Lethargy today. 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 Former smoker. EXAM: CHEST  2 VIEW COMPARISON:  Chest x-ray of July 2nd 2018 FINDINGS: The right hemidiaphragm is elevated as compared to the left. There is persistent abnormal density in the right perihilar region. On the left there is no focal infiltrate or evidence of a parenchymal mass. The heart is top-normal in size. The pulmonary vascularity is normal. The mediastinum is normal in width. IMPRESSION: New elevation of the right hemidiaphragm. Stable parenchymal density in the right mid lung consistent with known metastatic disease. No CHF nor pneumonia. Electronically Signed   By: David  Martinique M.D.   On: 01/26/2017 13:30   Ir Ivc Filter Plmt / S&i /img Guid/mod Sed  Result Date: 01/27/2017 INDICATION: Acute femoral popliteal DVT, patient cannot be anticoagulated, hemorrhagic intracranial metastatic disease EXAM: ULTRASOUND GUIDANCE FOR VASCULAR ACCESS IVC CATHETERIZATION AND VENOGRAM IVC FILTER INSERTION Date:  10/4/201810/07/2016 5:22 pm Radiologist:  M. Daryll Brod, MD Guidance:  Ultrasound and fluoroscopic CONTRAST:  65 cc Isovue-300 MEDICATIONS: 1% lidocaine local ANESTHESIA/SEDATION: Moderate Sedation Time: None. The patient was continuously monitored during the procedure by the interventional radiology nurse under my direct supervision. FLUOROSCOPY TIME:  Fluoroscopy Time: 3 minutes 24 seconds (25 mGy). COMPLICATIONS: None immediate. PROCEDURE: Informed consent was obtained from the patient following explanation of the  procedure, risks, benefits and alternatives. The patient understands, agrees and consents for the procedure. All questions were addressed. A time out was performed. Maximal barrier sterile technique utilized including caps, mask, sterile gowns, sterile gloves, large sterile drape, hand hygiene, and betadine prep. Under sterile condition and local anesthesia, right internal jugular venous access was performed with ultrasound. Over a guide wire, the IVC filter delivery sheath and inner dilator were advanced into the IVC just above the IVC bifurcation. Contrast injection was performed for an IVC venogram. IVC VENOGRAM: The IVC is patent. No evidence of thrombus, stenosis, or occlusion. No variant venous anatomy. The renal veins are identified at L1. IVC FILTER INSERTION: Through the delivery sheath, the bard Denali IVC filter was deployed in the infrarenal IVC at the L2-3 level just below the renal veins and above the IVC bifurcation. Contrast injection confirmed position. There is good apposition of the filter against the IVC. The delivery sheath was removed and hemostasis was obtained with compression for 5 minutes. The patient tolerated the procedure well. No immediate complications. IMPRESSION: Ultrasound and fluoroscopically guided infrarenal IVC filter insertion. PLAN: Due to patient related comorbidities and/or clinical necessity, this IVC filter should be considered a permanent device. This patient will not be actively followed for future filter retrieval. Electronically Signed   By: Jerilynn Mages.  Shick M.D.   On: 01/27/2017 07:46   Dg Shoulder Left  Result Date: 01/25/2017 CLINICAL DATA:  History of left shoulder subluxation. 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 EXAM: LEFT SHOULDER - 2+ VIEW COMPARISON:  Chest x-ray dated October 24, 2016 which included portions of the left shoulder. FINDINGS: The bones are subjectively adequately mineralized. The glenohumeral joint space is  well maintained.  The subacromial subdeltoid space is normal. As best as can be determined the acromioclavicular joint is also normal. There is no evidence of acute or old fracture. IMPRESSION: There is no acute or significant chronic bony abnormality of the left shoulder. Electronically Signed   By: David  Martinique M.D.   On: 01/25/2017 16:20   Results for orders placed or performed during the hospital encounter of 01/18/17 (from the past 72 hour(s))  Glucose, capillary     Status: Abnormal   Collection Time: 01/25/17  6:41 AM  Result Value Ref Range   Glucose-Capillary 110 (H) 65 - 99 mg/dL  Basic metabolic panel     Status: Abnormal   Collection Time: 01/25/17  6:52 AM  Result Value Ref Range   Sodium 133 (L) 135 - 145 mmol/L   Potassium 3.6 3.5 - 5.1 mmol/L   Chloride 101 101 - 111 mmol/L   CO2 23 22 - 32 mmol/L   Glucose, Bld 104 (H) 65 - 99 mg/dL   BUN 13 6 - 20 mg/dL   Creatinine, Ser 0.60 0.44 - 1.00 mg/dL   Calcium 8.4 (L) 8.9 - 10.3 mg/dL   GFR calc non Af Amer >60 >60 mL/min   GFR calc Af Amer >60 >60 mL/min    Comment: (NOTE) The eGFR has been calculated using the CKD EPI equation. This calculation has not been validated in all clinical situations. eGFR's persistently <60 mL/min signify possible Chronic Kidney Disease.    Anion gap 9 5 - 15  CBC with Differential/Platelet     Status: Abnormal   Collection Time: 01/25/17  6:52 AM  Result Value Ref Range   WBC 5.0 4.0 - 10.5 K/uL   RBC 3.51 (L) 3.87 - 5.11 MIL/uL   Hemoglobin 10.0 (L) 12.0 - 15.0 g/dL   HCT 30.6 (L) 36.0 - 46.0 %   MCV 87.2 78.0 - 100.0 fL   MCH 28.5 26.0 - 34.0 pg   MCHC 32.7 30.0 - 36.0 g/dL   RDW 15.8 (H) 11.5 - 15.5 %   Platelets 120 (L) 150 - 400 K/uL   Neutrophils Relative % 67 %   Neutro Abs 3.3 1.7 - 7.7 K/uL   Lymphocytes Relative 18 %   Lymphs Abs 0.9 0.7 - 4.0 K/uL   Monocytes Relative 12 %   Monocytes Absolute 0.6 0.1 - 1.0 K/uL   Eosinophils Relative 3 %   Eosinophils Absolute 0.1 0.0  - 0.7 K/uL   Basophils Relative 0 %   Basophils Absolute 0.0 0.0 - 0.1 K/uL  Glucose, capillary     Status: Abnormal   Collection Time: 01/26/17  5:45 AM  Result Value Ref Range   Glucose-Capillary 114 (H) 65 - 99 mg/dL  Urinalysis, Routine w reflex microscopic     Status: None   Collection Time: 01/26/17  2:43 PM  Result Value Ref Range   Color, Urine YELLOW YELLOW   APPearance CLEAR CLEAR   Specific Gravity, Urine 1.005 1.005 - 1.030   pH 8.0 5.0 - 8.0   Glucose, UA NEGATIVE NEGATIVE mg/dL   Hgb urine dipstick NEGATIVE NEGATIVE   Bilirubin Urine NEGATIVE NEGATIVE   Ketones, ur NEGATIVE NEGATIVE mg/dL   Protein, ur NEGATIVE NEGATIVE mg/dL   Nitrite NEGATIVE NEGATIVE   Leukocytes, UA NEGATIVE NEGATIVE  CBC with Differential/Platelet     Status: Abnormal   Collection Time: 01/27/17  4:40 AM  Result Value Ref Range   WBC 6.1 4.0 - 10.5 K/uL   RBC 3.57 (L)  3.87 - 5.11 MIL/uL   Hemoglobin 10.0 (L) 12.0 - 15.0 g/dL   HCT 31.2 (L) 36.0 - 46.0 %   MCV 87.4 78.0 - 100.0 fL   MCH 28.0 26.0 - 34.0 pg   MCHC 32.1 30.0 - 36.0 g/dL   RDW 15.4 11.5 - 15.5 %   Platelets 124 (L) 150 - 400 K/uL   Neutrophils Relative % 75 %   Neutro Abs 4.6 1.7 - 7.7 K/uL   Lymphocytes Relative 13 %   Lymphs Abs 0.8 0.7 - 4.0 K/uL   Monocytes Relative 10 %   Monocytes Absolute 0.6 0.1 - 1.0 K/uL   Eosinophils Relative 2 %   Eosinophils Absolute 0.1 0.0 - 0.7 K/uL   Basophils Relative 0 %   Basophils Absolute 0.0 0.0 - 0.1 K/uL  Basic metabolic panel     Status: Abnormal   Collection Time: 01/27/17  4:40 AM  Result Value Ref Range   Sodium 133 (L) 135 - 145 mmol/L   Potassium 3.4 (L) 3.5 - 5.1 mmol/L   Chloride 106 101 - 111 mmol/L   CO2 23 22 - 32 mmol/L   Glucose, Bld 111 (H) 65 - 99 mg/dL   BUN 8 6 - 20 mg/dL   Creatinine, Ser 0.53 0.44 - 1.00 mg/dL   Calcium 8.1 (L) 8.9 - 10.3 mg/dL   GFR calc non Af Amer >60 >60 mL/min   GFR calc Af Amer >60 >60 mL/min    Comment: (NOTE) The eGFR has been  calculated using the CKD EPI equation. This calculation has not been validated in all clinical situations. eGFR's persistently <60 mL/min signify possible Chronic Kidney Disease.    Anion gap 4 (L) 5 - 15  Glucose, capillary     Status: Abnormal   Collection Time: 01/27/17  7:06 AM  Result Value Ref Range   Glucose-Capillary 103 (H) 65 - 99 mg/dL     HEENT: normal Cardio: RRR without murmur. No JVD   Resp: CTA Bilaterally without wheezes or rales. Normal effort   GI: BS positive and NT, ND Extremity:  Pulses positive and No Edema Skin:   Intact Neuro: Alert/Oriented, Abnormal Sensory Reduced on Left and Abnormal Motor 2- L biceps, 0 left delt, triceps, finger flex/ext, 2- hip/knee ext synergy, no evidence of flexor withdrawl spasticity Musc/Skel:  Other no pain with UE or LE ROM. No LBP currently, no calf pain with palpation Gen NAD   Assessment/Plan: 1. Functional deficits secondary to RIght frontal metastasis breast CA primary which require 3+ hours per day of interdisciplinary therapy in a comprehensive inpatient rehab setting. Physiatrist is providing close team supervision and 24 hour management of active medical problems listed below. Physiatrist and rehab team continue to assess barriers to discharge/monitor patient progress toward functional and medical goals. FIM: Function - Bathing Position: Wheelchair/chair at sink Body parts bathed by patient: Left arm, Chest, Abdomen, Front perineal area, Right upper leg, Left upper leg, Right lower leg, Left lower leg Body parts bathed by helper: Right arm, Buttocks Assist Level: Touching or steadying assistance(Pt > 75%) Assistive Device Comment: long handled sponge and wash mit   Function- Upper Body Dressing/Undressing What is the patient wearing?: Pull over shirt/dress Pull over shirt/dress - Perfomed by patient: Thread/unthread right sleeve, Put head through opening, Pull shirt over trunk Pull over shirt/dress - Perfomed by  helper: Thread/unthread left sleeve Assist Level: Touching or steadying assistance(Pt > 75%) Function - Lower Body Dressing/Undressing What is the patient wearing?: Pants, Non-skid  slipper socks, Shoes Position: Wheelchair/chair at sink Underwear - Performed by patient: Thread/unthread right underwear leg Underwear - Performed by helper: Thread/unthread left underwear leg, Pull underwear up/down Pants- Performed by patient: Thread/unthread right pants leg Pants- Performed by helper: Pull pants up/down, Thread/unthread left pants leg Non-skid slipper socks- Performed by patient: Don/doff right sock, Don/doff left sock Non-skid slipper socks- Performed by helper: Don/doff right sock, Don/doff left sock Shoes - Performed by patient: Don/doff right shoe Shoes - Performed by helper: Don/doff left shoe, Fasten right, Fasten left TED Hose - Performed by helper: Don/doff left TED hose, Don/doff right TED hose Assist for footwear: Maximal assist Assist for lower body dressing: Touching or steadying assistance (Pt > 75%)  Function - Toileting Toileting activity did not occur: Safety/medical concerns (currently on bedrest R/T DVT) Toileting steps completed by patient: Performs perineal hygiene Toileting steps completed by helper: Adjust clothing prior to toileting, Performs perineal hygiene, Adjust clothing after toileting Toileting Assistive Devices: Other (comment) (stedy) Assist level: Two helpers  Function - Air cabin crew transfer activity did not occur: Safety/medical concerns (bedrest) Toilet transfer assistive device: Elevated toilet seat/BSC over toilet, Mechanical lift Mechanical lift: Stedy Assist level to toilet: Moderate assist (Pt 50 - 74%/lift or lower) Assist level from toilet: Moderate assist (Pt 50 - 74%/lift or lower)  Function - Chair/bed transfer Chair/bed transfer method: Squat pivot Chair/bed transfer assist level: Maximal assist (Pt 25 - 49%/lift and  lower) Chair/bed transfer assistive device: Armrests Mechanical lift: Stedy Chair/bed transfer details: Manual facilitation for weight shifting, Manual facilitation for placement, Verbal cues for sequencing, Verbal cues for technique, Verbal cues for precautions/safety  Function - Locomotion: Wheelchair Will patient use wheelchair at discharge?: Yes Type: Manual Max wheelchair distance: 100' Assist Level: Maximal assistance (Pt 25 - 49%) Wheel 50 feet with 2 turns activity did not occur: Safety/medical concerns Wheel 150 feet activity did not occur: Safety/medical concerns Turns around,maneuvers to table,bed, and toilet,negotiates 3% grade,maneuvers on rugs and over doorsills: No Function - Locomotion: Ambulation Assistive device: Lite gait Max distance: 88 Assist level: 2 helpers Assist level: 2 helpers Walk 50 feet with 2 turns activity did not occur: Safety/medical concerns Assist level: 2 helpers Walk 150 feet activity did not occur: Safety/medical concerns Walk 10 feet on uneven surfaces activity did not occur: Safety/medical concerns  Function - Comprehension Comprehension: Auditory Comprehension assist level: Understands basic 75 - 89% of the time/ requires cueing 10 - 24% of the time  Function - Expression Expression: Verbal Expression assist level: Expresses basic 90% of the time/requires cueing < 10% of the time.  Function - Social Interaction Social Interaction assist level: Interacts appropriately 75 - 89% of the time - Needs redirection for appropriate language or to initiate interaction.  Function - Problem Solving Problem solving assist level: Solves basic 50 - 74% of the time/requires cueing 25 - 49% of the time  Function - Memory Memory assist level: Recognizes or recalls 50 - 74% of the time/requires cueing 25 - 49% of the time Patient normally able to recall (first 3 days only): Current season, Location of own room, Staff names and faces, That he or she is in  a hospital  Medical Problem List and Plan: 1. 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 DVT Prophylaxis/Anticoagulation: Mechanical:DVT- now  With IVC filter ok to resume therapy 3. Pain Management: tylenol prn + kpad 4. Mood: Stable on home dose Lexapro with ativan at nights to help  with sleep/anxiety. Sister supportive and mood appears stable. LCSW to follow for evaluation and support.  5. Neuropsych: This patient iscapable of making decisions on herown behalf. 6. Skin/Wound Care: routine pressure relief measures. Maintain adequate nutritional and hydration status.  7. Fluids/Electrolytes/Nutrition: Monitor I/O. CMET nl 9/27 except low alb,  -continue to encourage po,normal BMET 8. 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  9. Breast cancer with mets to brain,Right mid lung, T12 epidural  : To start  immunotherapy in the near future pending results.  10 GERD: due to steroids. Managed with PPI.  11. Right frontoparietal metastatic disease: Hemorrhage and increase in edema treated with decadron     -- 12. HTN: Normotensive off meds , running low, had a bolus, BMET is normal Vitals:   01/26/17 2140 01/27/17 0335  BP: (!) 90/58 (!) 90/48  Pulse: 78 79  Resp: 20 18  Temp: 97.9 F (36.6 C) 99.1 F (37.3 C)  SpO2: 95% 91%   13. Constipation: H . BM  10/3  LOS (Days) 9 A FACE TO FACE EVALUATION WAS PERFORMED  Krystalyn Kubota E 01/27/2017, 8:00 AM

## 2017-01-27 NOTE — Progress Notes (Signed)
Speech Language Pathology Daily Session Note  Patient Details  Name: Kelly Stuart MRN: [id number] Date of Birth: 04-Feb-1946  Today's Date: 01/27/2017 SLP Individual Time: 1200-1225 SLP Individual Time Calculation (min): 25 min  Short Term Goals: Week 1: SLP Short Term Goal 1 (Week 1): STG=LTGs  Skilled Therapeutic Interventions: Skilled treatment session focused on cognitive goals. SLP facilitated session by providing Mod-Max A verbal cues for problem solving during a basic money management task. Suspect patient's function was impacted by decreased selective attention to task in which she required overall Mod A verbal cues in a mildly distracting environment. Patient left upright in recliner with sister present. Continue with current plan of care.      Function:   Cognition Comprehension Comprehension assist level: Understands basic 75 - 89% of the time/ requires cueing 10 - 24% of the time  Expression   Expression assist level: Expresses basic 90% of the time/requires cueing < 10% of the time.  Social Interaction Social Interaction assist level: Interacts appropriately 75 - 89% of the time - Needs redirection for appropriate language or to initiate interaction.  Problem Solving Problem solving assist level: Solves basic 50 - 74% of the time/requires cueing 25 - 49% of the time  Memory Memory assist level: Recognizes or recalls 50 - 74% of the time/requires cueing 25 - 49% of the time    Pain No/Denies Pain  Therapy/Group: Individual Therapy  Kaytee Taliercio, [place] 01/27/2017, 3:28 PM

## 2017-01-27 NOTE — Progress Notes (Signed)
Patiently easily aroused, but immediately falls back to sleep. "I'm feeling ok." Awake long enough to drink 1 cup of water. BP=90/58. Right SCD applied. Right neck dressing CD& I. Patrici Ranks A

## 2017-01-27 NOTE — Progress Notes (Signed)
Occupational Therapy Session Note  Patient Details  Name: Kelly Stuart MRN: [id number] Date of Birth: 02/07/1946  Today's Date: 01/27/2017 OT Individual Time: 1430-1500 OT Individual Time Calculation (min): 30 min    Short Term Goals: Week 1:  OT Short Term Goal 1 (Week 1): Pt will recall hemi-dressing techniques 2 consecutive days with min questioning cues OT Short Term Goal 1 - Progress (Week 1): Met OT Short Term Goal 2 (Week 1): Pt will look past midline to left to locate 3/4 grooming items on L side of sink OT Short Term Goal 2 - Progress (Week 1): Progressing toward goal OT Short Term Goal 3 (Week 1): Pt will maintian midline orientation in sitting with MIN A OT Short Term Goal 3 - Progress (Week 1): Met  Skilled Therapeutic Interventions/Progress Updates:    1:1 Pt in recliner with sister Sarah present. Focus on d/c planning and toilet transfers. Pt max A with max facilitation for going to right onto the toilet. Mod A for sit to stand with total A for toileting steps. Pt had been incontinent of BM requiring total A for clean up. Moid A transfers towards her left into the w/c and then back into the recliner.  continues to be challenged by full hip and knee extensor tone of left LE in transfers.   Discussed STEDY and drop arm commode as options to explore for home.   Therapy Documentation Precautions:  Precautions Precautions: Fall, Other (comment) Precaution Comments: decreased attention to left; CA; h/o seizures Restrictions Weight Bearing Restrictions: No Pain: Pain Assessment Pain Assessment: 0-10 Pain Score: 0-No pain Pain Type: Chronic pain Pain Location: Back Pain Orientation: Mid Pain Descriptors / Indicators: Aching Pain Onset: Gradual Patients Stated Pain Goal: 0 Pain Intervention(s): Medication (See eMAR) ADL: ADL ADL Comments: Please see functional navigator  See Function Navigator for Current Functional Status.   Therapy/Group: Individual  Therapy  Smith, Jennifer Lynsey 01/27/2017, 3:56 PM 

## 2017-01-28 ENCOUNTER — Encounter (HOSPITAL_COMMUNITY): Payer: Self-pay

## 2017-01-28 ENCOUNTER — Inpatient Hospital Stay (HOSPITAL_COMMUNITY): Payer: Medicare Other | Admitting: Speech Pathology

## 2017-01-28 ENCOUNTER — Inpatient Hospital Stay (HOSPITAL_COMMUNITY): Payer: Medicare Other | Admitting: Occupational Therapy

## 2017-01-28 ENCOUNTER — Inpatient Hospital Stay (HOSPITAL_COMMUNITY): Payer: Medicare Other

## 2017-01-28 ENCOUNTER — Inpatient Hospital Stay (HOSPITAL_COMMUNITY)
Admission: AD | Admit: 2017-01-28 | Discharge: 2017-02-01 | DRG: 175 | Disposition: A | Discharge status: Hospice | Payer: Medicare Other | Source: Ambulatory Visit | Attending: Internal Medicine | Admitting: Internal Medicine

## 2017-01-28 DIAGNOSIS — C3431 Malignant neoplasm of lower lobe, right bronchus or lung: Secondary | ICD-10-CM | POA: Diagnosis not present

## 2017-01-28 DIAGNOSIS — Z853 Personal history of malignant neoplasm of breast: Secondary | ICD-10-CM | POA: Diagnosis not present

## 2017-01-28 DIAGNOSIS — C7931 Secondary malignant neoplasm of brain: Secondary | ICD-10-CM | POA: Diagnosis not present

## 2017-01-28 DIAGNOSIS — F064 Anxiety disorder due to known physiological condition: Secondary | ICD-10-CM

## 2017-01-28 DIAGNOSIS — S062X9D Diffuse traumatic brain injury with loss of consciousness of unspecified duration, subsequent encounter: Secondary | ICD-10-CM | POA: Diagnosis not present

## 2017-01-28 DIAGNOSIS — I2602 Saddle embolus of pulmonary artery with acute cor pulmonale: Secondary | ICD-10-CM

## 2017-01-28 DIAGNOSIS — B009 Herpesviral infection, unspecified: Secondary | ICD-10-CM | POA: Diagnosis not present

## 2017-01-28 DIAGNOSIS — I2692 Saddle embolus of pulmonary artery without acute cor pulmonale: Secondary | ICD-10-CM | POA: Diagnosis present

## 2017-01-28 DIAGNOSIS — Z66 Do not resuscitate: Secondary | ICD-10-CM | POA: Diagnosis present

## 2017-01-28 DIAGNOSIS — E43 Unspecified severe protein-calorie malnutrition: Secondary | ICD-10-CM | POA: Diagnosis present

## 2017-01-28 DIAGNOSIS — Z888 Allergy status to other drugs, medicaments and biological substances status: Secondary | ICD-10-CM

## 2017-01-28 DIAGNOSIS — I619 Nontraumatic intracerebral hemorrhage, unspecified: Secondary | ICD-10-CM | POA: Diagnosis present

## 2017-01-28 DIAGNOSIS — C7970 Secondary malignant neoplasm of unspecified adrenal gland: Secondary | ICD-10-CM | POA: Diagnosis not present

## 2017-01-28 DIAGNOSIS — C3490 Malignant neoplasm of unspecified part of unspecified bronchus or lung: Secondary | ICD-10-CM | POA: Diagnosis not present

## 2017-01-28 DIAGNOSIS — Z882 Allergy status to sulfonamides status: Secondary | ICD-10-CM | POA: Diagnosis not present

## 2017-01-28 DIAGNOSIS — I2699 Other pulmonary embolism without acute cor pulmonale: Secondary | ICD-10-CM

## 2017-01-28 DIAGNOSIS — G8194 Hemiplegia, unspecified affecting left nondominant side: Secondary | ICD-10-CM | POA: Diagnosis not present

## 2017-01-28 DIAGNOSIS — C7989 Secondary malignant neoplasm of other specified sites: Secondary | ICD-10-CM

## 2017-01-28 DIAGNOSIS — Z86718 Personal history of other venous thrombosis and embolism: Secondary | ICD-10-CM | POA: Diagnosis not present

## 2017-01-28 DIAGNOSIS — I611 Nontraumatic intracerebral hemorrhage in hemisphere, cortical: Secondary | ICD-10-CM | POA: Diagnosis not present

## 2017-01-28 DIAGNOSIS — Z885 Allergy status to narcotic agent status: Secondary | ICD-10-CM

## 2017-01-28 DIAGNOSIS — Z9221 Personal history of antineoplastic chemotherapy: Secondary | ICD-10-CM | POA: Diagnosis not present

## 2017-01-28 DIAGNOSIS — Z87891 Personal history of nicotine dependence: Secondary | ICD-10-CM | POA: Diagnosis not present

## 2017-01-28 DIAGNOSIS — Z7189 Other specified counseling: Secondary | ICD-10-CM

## 2017-01-28 DIAGNOSIS — E440 Moderate protein-calorie malnutrition: Secondary | ICD-10-CM | POA: Diagnosis not present

## 2017-01-28 DIAGNOSIS — I2609 Other pulmonary embolism with acute cor pulmonale: Secondary | ICD-10-CM | POA: Diagnosis not present

## 2017-01-28 DIAGNOSIS — I82439 Acute embolism and thrombosis of unspecified popliteal vein: Secondary | ICD-10-CM | POA: Diagnosis present

## 2017-01-28 DIAGNOSIS — Z515 Encounter for palliative care: Secondary | ICD-10-CM | POA: Diagnosis not present

## 2017-01-28 DIAGNOSIS — C801 Malignant (primary) neoplasm, unspecified: Secondary | ICD-10-CM | POA: Diagnosis not present

## 2017-01-28 DIAGNOSIS — R569 Unspecified convulsions: Secondary | ICD-10-CM | POA: Diagnosis not present

## 2017-01-28 DIAGNOSIS — C772 Secondary and unspecified malignant neoplasm of intra-abdominal lymph nodes: Secondary | ICD-10-CM | POA: Diagnosis not present

## 2017-01-28 DIAGNOSIS — C7951 Secondary malignant neoplasm of bone: Secondary | ICD-10-CM | POA: Diagnosis not present

## 2017-01-28 DIAGNOSIS — R270 Ataxia, unspecified: Secondary | ICD-10-CM | POA: Diagnosis not present

## 2017-01-28 DIAGNOSIS — J984 Other disorders of lung: Secondary | ICD-10-CM | POA: Diagnosis not present

## 2017-01-28 DIAGNOSIS — M6289 Other specified disorders of muscle: Secondary | ICD-10-CM | POA: Diagnosis not present

## 2017-01-28 DIAGNOSIS — C787 Secondary malignant neoplasm of liver and intrahepatic bile duct: Secondary | ICD-10-CM | POA: Diagnosis not present

## 2017-01-28 LAB — GLUCOSE, CAPILLARY: GLUCOSE-CAPILLARY: 91 mg/dL (ref 65–99)

## 2017-01-28 MED ORDER — ONDANSETRON HCL 4 MG/2ML IJ SOLN: 4.0000 mg | Freq: Four times a day (QID) | INTRAMUSCULAR | Status: DC | PRN

## 2017-01-28 MED ORDER — ESCITALOPRAM OXALATE 20 MG PO TABS
20.0000 mg | ORAL_TABLET | Freq: Every day | ORAL | Status: DC
  Administered 2017-01-29 – 2017-02-01 (×4): 20 mg via ORAL
  Filled 2017-01-28 (×4): qty 1

## 2017-01-28 MED ORDER — LORAZEPAM 0.5 MG PO TABS
0.5000 mg | ORAL_TABLET | ORAL | Status: DC | PRN
  Administered 2017-01-28 – 2017-02-01 (×5): 0.5 mg via ORAL
  Filled 2017-01-28 (×6): qty 1

## 2017-01-28 MED ORDER — IOPAMIDOL (ISOVUE-300) INJECTION 61%
INTRAVENOUS | Status: AC
  Administered 2017-01-28: 100 mL
  Filled 2017-01-28: qty 100

## 2017-01-28 MED ORDER — PREMIER PROTEIN SHAKE: 11.0000 [oz_av] | ORAL | Status: DC

## 2017-01-28 MED ORDER — ACETAMINOPHEN 650 MG RE SUPP: 650.0000 mg | Freq: Four times a day (QID) | RECTAL | Status: DC | PRN

## 2017-01-28 MED ORDER — ONDANSETRON HCL 4 MG PO TABS: 4.0000 mg | ORAL_TABLET | Freq: Four times a day (QID) | ORAL | Status: DC | PRN

## 2017-01-28 MED ORDER — ACETAMINOPHEN 325 MG PO TABS
650.0000 mg | ORAL_TABLET | Freq: Four times a day (QID) | ORAL | Status: DC | PRN
  Administered 2017-01-30 – 2017-01-31 (×3): 650 mg via ORAL
  Filled 2017-01-28 (×4): qty 2

## 2017-01-28 MED ORDER — PANTOPRAZOLE SODIUM 40 MG PO TBEC
40.0000 mg | DELAYED_RELEASE_TABLET | Freq: Two times a day (BID) | ORAL | Status: DC
  Administered 2017-01-28 – 2017-02-01 (×7): 40 mg via ORAL
  Filled 2017-01-28 (×7): qty 1

## 2017-01-28 MED ORDER — IOPAMIDOL (ISOVUE-300) INJECTION 61%
15.0000 mL | INTRAVENOUS | Status: AC
  Administered 2017-01-28 (×2): 15 mL via ORAL

## 2017-01-28 MED ORDER — FENTANYL CITRATE (PF) 100 MCG/2ML IJ SOLN: 12.5000 ug | INTRAMUSCULAR | Status: DC | PRN

## 2017-01-28 MED ORDER — DEXAMETHASONE 4 MG PO TABS
4.0000 mg | ORAL_TABLET | Freq: Two times a day (BID) | ORAL | Status: DC
  Administered 2017-01-28 – 2017-02-01 (×7): 4 mg via ORAL
  Filled 2017-01-28 (×8): qty 1

## 2017-01-28 MED ORDER — ACETAMINOPHEN 325 MG PO TABS: 650.0000 mg | ORAL_TABLET | Freq: Four times a day (QID) | ORAL | Status: DC | PRN

## 2017-01-28 MED ORDER — LEVETIRACETAM 500 MG PO TABS
1000.0000 mg | ORAL_TABLET | Freq: Two times a day (BID) | ORAL | Status: DC
  Administered 2017-01-28 – 2017-02-01 (×7): 1000 mg via ORAL
  Filled 2017-01-28 (×7): qty 2

## 2017-01-28 MED ORDER — SODIUM CHLORIDE 0.9 % IV SOLN: INTRAVENOUS | Status: DC

## 2017-01-28 MED ORDER — PREMIER PROTEIN SHAKE
11.0000 [oz_av] | ORAL | Status: DC
  Filled 2017-01-28 (×3): qty 325.31

## 2017-01-28 NOTE — Progress Notes (Signed)
Kelly Stuart is a 71 y.o. female October 10, 1945 [id number]  Subjective: No new complaints. Preparing to go down for CT at my visit - Feeling OK and appreciative of care  Objective: Vital signs in last 24 hours: Temp:  [98.6 F (37 C)-99 F (37.2 C)] 99 F (37.2 C) (10/06 0615) Pulse Rate:  [82] 82 (10/06 0615) BP: (102-108)/(56-58) 102/58 (10/06 0615) SpO2:  [95 %-97 %] 97 % (10/06 0615) Weight change:  Last BM Date: 01/26/17  Intake/Output from previous day: 10/05 0701 - 10/06 0700 In: 900 [P.O.:900] Out: -   Physical Exam General: No apparent distress    Lungs: Normal effort. Lungs clear to auscultation, no crackles or wheezes. Cardiovascular: Regular rate and rhythm, no edema   Lab Results: BMET    Component Value Date/Time   NA 133 (L) 01/27/2017 0440   NA 141 02/03/2014 1259   K 3.4 (L) 01/27/2017 0440   K 4.2 02/03/2014 1259   CL 106 01/27/2017 0440   CL 106 06/12/2012 1411   CO2 23 01/27/2017 0440   CO2 25 02/03/2014 1259   GLUCOSE 111 (H) 01/27/2017 0440   GLUCOSE 121 02/03/2014 1259   GLUCOSE 163 (H) 06/12/2012 1411   BUN 8 01/27/2017 0440   BUN 13.1 02/03/2014 1259   CREATININE 0.53 01/27/2017 0440   CREATININE 0.8 02/03/2014 1259   CALCIUM 8.1 (L) 01/27/2017 0440   CALCIUM 9.6 02/03/2014 1259   GFRNONAA >60 01/27/2017 0440   GFRAA >60 01/27/2017 0440   CBC    Component Value Date/Time   WBC 6.1 01/27/2017 0440   RBC 3.57 (L) 01/27/2017 0440   HGB 10.0 (L) 01/27/2017 0440   HGB 12.8 09/11/2014 0901   HGB 12.6 02/03/2014 1258   HCT 31.2 (L) 01/27/2017 0440   HCT 38.4 02/03/2014 1258   PLT 124 (L) 01/27/2017 0440   PLT 265 02/03/2014 1258   MCV 87.4 01/27/2017 0440   MCV 97.8 02/03/2014 1258   MCH 28.0 01/27/2017 0440   MCHC 32.1 01/27/2017 0440   RDW 15.4 01/27/2017 0440   RDW 13.0 02/03/2014 1258   LYMPHSABS 0.8 01/27/2017 0440   LYMPHSABS 2.3 02/03/2014 1258   MONOABS 0.6 01/27/2017 0440   MONOABS 0.4 02/03/2014 1258   EOSABS 0.1  01/27/2017 0440   EOSABS 0.1 02/03/2014 1258   BASOSABS 0.0 01/27/2017 0440   BASOSABS 0.0 02/03/2014 1258   CBG's (last 3):    Recent Labs  01/26/17 0545 01/27/17 0706 01/28/17 0610  GLUCAP 114* 103* 91   LFT's Lab Results  Component Value Date   ALT 23 01/19/2017   AST 21 01/19/2017   ALKPHOS 74 01/19/2017   BILITOT 0.7 01/19/2017    Studies/Results: Dg Chest 2 View  Result Date: 01/26/2017 CLINICAL DATA:  Lethargy today. 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 Former smoker. EXAM: CHEST  2 VIEW COMPARISON:  Chest x-ray of July 2nd 2018 FINDINGS: The right hemidiaphragm is elevated as compared to the left. There is persistent abnormal density in the right perihilar region. On the left there is no focal infiltrate or evidence of a parenchymal mass. The heart is top-normal in size. The pulmonary vascularity is normal. The mediastinum is normal in width. IMPRESSION: New elevation of the right hemidiaphragm. Stable parenchymal density in the right mid lung consistent with known metastatic disease. No CHF nor pneumonia. Electronically Signed   By: David  Martinique M.D.   On: 01/26/2017 13:30   Ir Ivc Filter Plmt / S&i /  img Guid/mod Sed  Result Date: 01/27/2017 INDICATION: Acute femoral popliteal DVT, patient cannot be anticoagulated, hemorrhagic intracranial metastatic disease EXAM: ULTRASOUND GUIDANCE FOR VASCULAR ACCESS IVC CATHETERIZATION AND VENOGRAM IVC FILTER INSERTION Date:  10/4/201810/07/2016 5:22 pm Radiologist:  M. Daryll Brod, MD Guidance:  Ultrasound and fluoroscopic CONTRAST:  65 cc Isovue-300 MEDICATIONS: 1% lidocaine local ANESTHESIA/SEDATION: Moderate Sedation Time: None. The patient was continuously monitored during the procedure by the interventional radiology nurse under my direct supervision. FLUOROSCOPY TIME:  Fluoroscopy Time: 3 minutes 24 seconds (25 mGy). COMPLICATIONS: None immediate. PROCEDURE: Informed consent was obtained from  the patient following explanation of the procedure, risks, benefits and alternatives. The patient understands, agrees and consents for the procedure. All questions were addressed. A time out was performed. Maximal barrier sterile technique utilized including caps, mask, sterile gowns, sterile gloves, large sterile drape, hand hygiene, and betadine prep. Under sterile condition and local anesthesia, right internal jugular venous access was performed with ultrasound. Over a guide wire, the IVC filter delivery sheath and inner dilator were advanced into the IVC just above the IVC bifurcation. Contrast injection was performed for an IVC venogram. IVC VENOGRAM: The IVC is patent. No evidence of thrombus, stenosis, or occlusion. No variant venous anatomy. The renal veins are identified at L1. IVC FILTER INSERTION: Through the delivery sheath, the bard Denali IVC filter was deployed in the infrarenal IVC at the L2-3 level just below the renal veins and above the IVC bifurcation. Contrast injection confirmed position. There is good apposition of the filter against the IVC. The delivery sheath was removed and hemostasis was obtained with compression for 5 minutes. The patient tolerated the procedure well. No immediate complications. IMPRESSION: Ultrasound and fluoroscopically guided infrarenal IVC filter insertion. PLAN: Due to patient related comorbidities and/or clinical necessity, this IVC filter should be considered a permanent device. This patient will not be actively followed for future filter retrieval. Electronically Signed   By: Jerilynn Mages.  Shick M.D.   On: 01/27/2017 07:46    Medications:  I have reviewed the patient's current medications. Scheduled Medications: . acetaminophen  650 mg Oral BID WC  . cyanocobalamin  1,000 mcg Intramuscular Q Fri-1800  . dexamethasone  4 mg Oral Daily  . docusate sodium  100 mg Oral QHS  . escitalopram  20 mg Oral Daily  . levETIRAcetam  1,000 mg Oral BID  . multivitamin with  minerals  1 tablet Oral Daily  . MUSCLE RUB   Topical TID WC & HS  . potassium chloride  20 mEq Oral BID  . protein supplement shake  2 oz Oral BID BM  . protein supplement  1 scoop Oral TID WC   PRN Medications: acetaminophen, alum & mag hydroxide-simeth, bisacodyl, diphenhydrAMINE, guaiFENesin-dextromethorphan, lidocaine, LORazepam, ondansetron **OR** ondansetron (ZOFRAN) IV, [DISCONTINUED] prochlorperazine **OR** [DISCONTINUED] prochlorperazine **OR** prochlorperazine, simethicone, sodium phosphate, traZODone  Assessment/Plan: Principal Problem:   Metastatic cancer to brain [facility]) Active Problems:   Metastatic lung cancer (metastasis from lung to other site), unspecified laterality ([place])   Ataxia   Acute left-sided weakness   Hemorrhage, intracerebral ([place])  1. Weakness/ L HP due to [place] within R frontal brain lesion from met ca - continue CIR support for rehab -  2. Met adeno lung ca -seen by neuro onc and to be discussed at Tumor Board Mon; on steroids for brain met and Keppra for sz prophlaxis 3. Extensive LLE DVT s/p IVC filter as not candidate at present for anticoag following ICH 4. HTN hx - currently off meds -  monitor   Length of stay, days: 10  Effie Wahlert A. Asa Lente, MD 01/28/2017, 11:59 AM

## 2017-01-28 NOTE — Progress Notes (Signed)
RN placed foley per MD order and pt prepared for discharge to acute floor. Report called to oncoming RN and pt notified of new transfer order. Pt transferring to 4E05. Family at bedside and are supportive.

## 2017-01-28 NOTE — Progress Notes (Signed)
PRN ativan given at 2008, per patient's request. +/- sleep. Incontinent of stool during night, requiring total assist. Patrici Ranks A

## 2017-01-28 NOTE — Progress Notes (Signed)
0.5mg  ativan given. MAR would not let me document. Witnessed by Steffanie Dunn, Therapist, sports.

## 2017-01-28 NOTE — Progress Notes (Signed)
RN received several calls from radiology concerning results of CT scans this morning.  MD on call spoke with RN And let her know patient would be discharging to acute due to pulmonary embolism.  Family notified and awaiting MD orders for transfer from acute. Pt visibly anxious and tearful at this time. Will continue to monitor.

## 2017-01-28 NOTE — Progress Notes (Signed)
Overall, I spent 35 minutes on direct counseling, total about 70 minutes on reviewing her case & consult

## 2017-01-28 NOTE — Progress Notes (Signed)
Kelly Stuart   DOB:06-Feb-1946   FO#:[id number]   I was consulted urgently to see this patient due to extensive saddle PE, detected on surveillance imaging study. This patient has complex medical history of breast cancer and metastatic lung cancer to the brain, bone, bilateral adrenals and lymph node. 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      05/18/2011 - 03/12/2012 Anti-estrogen oral therapy    Neoadjuvant Arimidex 1 mg daily      02/29/2012 Surgery    Left breast lumpectomy without sentinel lymph node biopsy (patient declined), 1.1 cm well-differentiated IDC ER/PR positive HER-2 negative T1 cN0 M0 stage IA       Radiation Therapy    Declined radiation therapy, also declined antiestrogen therapy      02/11/2016 Surgery    Left Lumpectomy: 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      12/30/2016 -  Radiation Therapy    SRS with Dr. Isidore Moos initiated       Metastatic lung cancer (metastasis from lung to other site), unspecified laterality ([place])   11/21/2016 Initial Diagnosis    Left adrenal gland biopsy 11/21/2016: Consistent with metastatic lung adenocarcinoma TTF-1 positive, negative for GATA-3, CD56, CK5/6, and ER.      12/03/2016 Imaging    Brain MRI: 2.4 x 2 cm hemorrhagic metastatic deposit right frontal parietal lobe, 6.5 mm metastatic deposit left medial parietal lobe       12/20/2016 Imaging    Brain MRI: Interval increase in size of the posterior right frontal lobe and left parietal lobe lesions reflecting interval hemorrhage, additional 4 mm lesion left parietal lobe      12/31/2016 Imaging    Brain MRI: Right parietal lobe 3.7 x 3.8 cm tumor increase in size; another hemorrhagic metastatic deposit left medial parietal lobe 1.2 cm previously was 1 cm      01/03/2017 Imaging    CT chest 07/11/2018done for chronic cough for 4 months Right lower lobe lung spiculated mass 1.8 cm with right hilar and mediastinal adenopathy, right hilar soft tissue density causes mass effect, soft tissue density filling the right lower lobe bronchi, groundglass nodularity right upper lobe, 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      01/04/2017 - 01/05/2017 Radiation Therapy    Stereotactic radiosurgery for brain metastases      The patient was undergoing physical rehabilitation when she underwent surveillance imaging. Despite massive saddle PE, she is relatively asymptomatic. She had mild, nonproductive cough. She denies chest pain, dizziness or shortness of breath. The patient developed left-sided weakness since recent hemorrhagic stroke and has been bedbound. However, according to patient and family members, she is doing "very well" with physical therapy and rehabilitation. There was no reported recent fever or chills.  Objective:  Vitals:   01/28/17 1547  BP: 102/61  Resp: Marland Kitchen)  25  Temp: 97.8 F (36.6 C)  SpO2: 96%     Intake/Output Summary (Last 24 hours) at 01/28/17 1621 Last data filed at 01/28/17 1549  Gross per 24 hour  Intake                0 ml  Output              800 ml  Net             -800 ml    GENERAL:alert, no distress and comfortable. SKIN: skin color, texture, turgor are normal, no rashes or significant lesions EYES: normal, Conjunctiva are pink and  non-injected, sclera clear OROPHARYNX:no exudate, no erythema and lips, buccal mucosa, and tongue normal  NECK: supple, thyroid normal size, non-tender, without nodularity LYMPH:  no palpable lymphadenopathy in the cervical, axillary or inguinal LUNGS: clear to auscultation and percussion with normal breathing effort HEART: regular rate & rhythm and no murmurs and no lower extremity edema ABDOMEN:abdomen soft, non-tender and normal bowel sounds Musculoskeletal:no cyanosis of digits and no clubbing  NEURO: alert & oriented x 3 with fluent speech, noted left-sided weakness  Labs:  Lab Results  Component Value Date   WBC 6.1 01/27/2017   HGB 10.0 (L) 01/27/2017   HCT 31.2 (L) 01/27/2017   MCV 87.4 01/27/2017   PLT 124 (L) 01/27/2017   NEUTROABS 4.6 01/27/2017    Lab Results  Component Value Date   NA 133 (L) 01/27/2017   K 3.4 (L) 01/27/2017   CL 106 01/27/2017   CO2 23 01/27/2017    Studies: I have reviewed multiple imaging study with the patient and family  Ct Chest W Contrast  Result Date: 01/28/2017 CLINICAL DATA:  71 year-old female with history of lung cancer about to start chemotherapy. Baseline CT examination. EXAM: CT CHEST, ABDOMEN, AND PELVIS WITH CONTRAST TECHNIQUE: Multidetector CT imaging of the chest, abdomen and pelvis was performed following the standard protocol during bolus administration of intravenous contrast. CONTRAST:  168m ISOVUE-300 IOPAMIDOL (ISOVUE-300) INJECTION 61% COMPARISON:  Chest CT 11/02/2016.  PET-CT 11/23/2016. FINDINGS: CT CHEST FINDINGS Cardiovascular: Large filling defects in pulmonary artery branches throughout both lungs, including a large saddle embolus, as well as lobar, segmental and subsegmental sized emboli. Some of these appear occlusive (right lower lobe) while most are nonocclusive. Right ventricle appears mildly dilated when compared with the (RV diameter of approximately 5.2 cm versus LV diameter approximately 4.3 cm) resulting in an  elevated RV to LV ratio of 1.21, concerning for right heart strain. Pulmonic trunk is not dilated at this time. There is no significant pericardial fluid, thickening or pericardial calcification. Aortic atherosclerosis. No definite coronary artery calcifications. Mediastinum/Nodes: Extensive nodal tissue is again noted throughout the right hilar region, with individual lymph nodes measuring up to at least 1 cm in short axis. No other mediastinal or left hilar lymphadenopathy is noted. Esophagus is unremarkable in appearance. No axillary lymphadenopathy. Lungs/Pleura: Previously noted mass in the superior segment of the right lower lobe currently measures 1.9 x 2.6 x 4.0 cm (axial image 64 of series 4 and coronal image 114 of series 6). Compared a prior examinations there is again an abundant infrahilar soft tissue adjacent to the right lower lobe bronchus, with increasing narrowing of the bronchus on today's study best appreciated on axial image 28 of series 3), and increasing postobstructive changes in the basal segments of the right lower lobe common most of which appear impacted with secretions. More distally in the basal  segments of the right lower lobe there is some peribronchovascular micronodularity, likely reflective of postobstructive changes. No other definite suspicious appearing pulmonary nodules or masses are noted in the left lung. No pleural effusions. Musculoskeletal: Multiple lytic lesions are noted throughout the visualized axial and appendicular skeleton, including a lesion in the posterior aspect of the T7 vertebral body, lesion in the posterior aspect of the T8 vertebral body on the left side which also involves the adjacent medial aspect of the left eighth rib which is expansile, lytic and fractured in multiple places (pathologic fracture) with extension into the overlying soft tissues (best appreciated on axial image 32 of series 3), lesion in the posterior aspect of T12 on the right side  extending into the transverse process, and large lytic lesion in the T12 spinous process. All of these osseous changes appear progressive compared to the prior study from 11/02/2016. Surgical clips in the left breast, presumably from prior lumpectomy. CT ABDOMEN PELVIS FINDINGS Hepatobiliary: When compared to prior examinations, the previously noted right adrenal mass has enlarged, and is now invasive into the medial aspect of the right lobe of the liver occupying predominantly segment 7, where the intrahepatic portion of the mass measures up to 8.1 x 6.0 x 3.4 cm (axial image 49 of series 3 and coronal image 99 of series 6). No other hepatic lesions are confidently identified. No intra or extrahepatic biliary ductal dilatation. Gallbladder is normal in appearance. Pancreas: No pancreatic mass. No pancreatic ductal dilatation. No pancreatic or peripancreatic fluid or inflammatory changes. Spleen: Unremarkable. Adrenals/Urinary Tract: Bilateral adrenal masses are again noted, but significantly increased in size compared to the prior study, with the right adrenal mass now invading the right lobe of the liver (discussed above). The overall dimensions of the right adrenal mass (including the exclusively intrahepatic portion which was reported above) are approximately 6.2 x 6.0 x 8.1 cm (coronal image 100 of series 6 and axial image 49 of series 3). The dimensions of the left adrenal mass are currently 6.7 x 3.4 x 3.7 cm (axial image 53 of series 3 and coronal image 94 of series 6). Left kidney is normal in appearance. The previously described right adrenal mass is now causing distortion of upper pole the right kidney such that early renal invasion is not entirely excluded (best appreciated on coronal image 105 of series 6). No hydroureteronephrosis. Urinary bladder is normal in appearance. Stomach/Bowel: The appearance of the stomach is normal. There is no pathologic dilatation of small bowel or colon. Numerous colonic  diverticulae are noted, particularly in the sigmoid colon, without surrounding inflammatory changes to suggest an acute diverticulitis at this time. Normal appendix. Vascular/Lymphatic: Aortic atherosclerosis (mild), without evidence of aneurysm or dissection in the abdominal or pelvic vasculature. IVC filter noted, with tip terminating just below the level of the renal veins. Notably, there does not appear to be significant clot burden associated with the IVC filter at this time. Additionally, pelvic veins appear widely patent. Extensive retroperitoneal lymphadenopathy noted, with the largest lymph nodes measuring up to 1.7 cm in the left para-aortic nodal station (axial image 67 of series 3). Multiple other borderline enlarged upper abdominal ligament lymph nodes are also noted, nonspecific but suspicious. In the subcutaneous fat immediately anterior to the lower anterior abdominal wall in the pelvic region (axial image 109 of series 3) there is a soft tissue attenuation lesion that measures 1.5 cm in short axis (axial image 109 of series 3), new compared to the prior study, likely to  represent enlarged lymph node. Reproductive: Probable fibroid uterus with multiple lesions, the largest of which measures 5.4 cm in the fundus of the uterus and is partially calcified. Retroverted uterus. Ovaries are unremarkable in appearance. Other: No significant volume of ascites. No pneumoperitoneum. Musculoskeletal: There are no aggressive appearing lytic or blastic lesions noted in the visualized portions of the skeleton. IMPRESSION: 1. Massive saddle pulmonary embolism with additional clot burden extending throughout both lungs involving central, lobar, segmental and subsegmental sized vessels, with both occlusive and nonocclusive thrombus and evidence of right-sided heart strain (RV to LV ratio 1.21) consistent with at least submassive (intermediate risk) PE. The presence of right heart strain has been associated with an  increased risk of morbidity and mortality. Please activate Code PE by paging [phone]. 2. Although the previously described mass in the superior segment of the right lower lobe appears relatively similar to prior examinations, there is clear progression of disease on today's study, with worsening postobstructive changes in the right lower lobe, worsening metastatic disease throughout the thoracic skeleton, enlarging bilateral adrenal metastasis (with direct invasion of the liver and potentially the superior aspect of the right kidney from the right adrenal mass), and worsening pelvic and retroperitoneal lymphadenopathy, as detailed above. 3. IVC filter appears appropriately positioned. Notably, there is no significant burden of clot associated with the filter at this time, and no evidence of deep venous thrombosis in the pelvic veins. 4. Extensive colonic diverticulosis without evidence of acute diverticulitis at this time. 5. Aortic atherosclerosis (mild). 6. Additional incidental findings, as above. Critical Value/emergent results were called by telephone at the time of interpretation on 01/28/2017 at 1:01 pm to Dr. Alvy Bimler and Dr. Nehemiah Massed (for Dr. Lindi Adie), who verbally acknowledged these results. Aortic Atherosclerosis (ICD10-I70.0). Electronically Signed   By: Vinnie Langton M.D.   On: 01/28/2017 13:40   Ct Abdomen Pelvis W Contrast  Result Date: 01/28/2017 CLINICAL DATA:  71 year-old female with history of lung cancer about to start chemotherapy. Baseline CT examination. EXAM: CT CHEST, ABDOMEN, AND PELVIS WITH CONTRAST TECHNIQUE: Multidetector CT imaging of the chest, abdomen and pelvis was performed following the standard protocol during bolus administration of intravenous contrast. CONTRAST:  132m ISOVUE-300 IOPAMIDOL (ISOVUE-300) INJECTION 61% COMPARISON:  Chest CT 11/02/2016.  PET-CT 11/23/2016. FINDINGS: CT CHEST FINDINGS Cardiovascular: Large filling defects in pulmonary artery branches  throughout both lungs, including a large saddle embolus, as well as lobar, segmental and subsegmental sized emboli. Some of these appear occlusive (right lower lobe) while most are nonocclusive. Right ventricle appears mildly dilated when compared with the (RV diameter of approximately 5.2 cm versus LV diameter approximately 4.3 cm) resulting in an elevated RV to LV ratio of 1.21, concerning for right heart strain. Pulmonic trunk is not dilated at this time. There is no significant pericardial fluid, thickening or pericardial calcification. Aortic atherosclerosis. No definite coronary artery calcifications. Mediastinum/Nodes: Extensive nodal tissue is again noted throughout the right hilar region, with individual lymph nodes measuring up to at least 1 cm in short axis. No other mediastinal or left hilar lymphadenopathy is noted. Esophagus is unremarkable in appearance. No axillary lymphadenopathy. Lungs/Pleura: Previously noted mass in the superior segment of the right lower lobe currently measures 1.9 x 2.6 x 4.0 cm (axial image 64 of series 4 and coronal image 114 of series 6). Compared a prior examinations there is again an abundant infrahilar soft tissue adjacent to the right lower lobe bronchus, with increasing narrowing of the bronchus on today's study best appreciated on  axial image 28 of series 3), and increasing postobstructive changes in the basal segments of the right lower lobe common most of which appear impacted with secretions. More distally in the basal segments of the right lower lobe there is some peribronchovascular micronodularity, likely reflective of postobstructive changes. No other definite suspicious appearing pulmonary nodules or masses are noted in the left lung. No pleural effusions. Musculoskeletal: Multiple lytic lesions are noted throughout the visualized axial and appendicular skeleton, including a lesion in the posterior aspect of the T7 vertebral body, lesion in the posterior aspect  of the T8 vertebral body on the left side which also involves the adjacent medial aspect of the left eighth rib which is expansile, lytic and fractured in multiple places (pathologic fracture) with extension into the overlying soft tissues (best appreciated on axial image 32 of series 3), lesion in the posterior aspect of T12 on the right side extending into the transverse process, and large lytic lesion in the T12 spinous process. All of these osseous changes appear progressive compared to the prior study from 11/02/2016. Surgical clips in the left breast, presumably from prior lumpectomy. CT ABDOMEN PELVIS FINDINGS Hepatobiliary: When compared to prior examinations, the previously noted right adrenal mass has enlarged, and is now invasive into the medial aspect of the right lobe of the liver occupying predominantly segment 7, where the intrahepatic portion of the mass measures up to 8.1 x 6.0 x 3.4 cm (axial image 49 of series 3 and coronal image 99 of series 6). No other hepatic lesions are confidently identified. No intra or extrahepatic biliary ductal dilatation. Gallbladder is normal in appearance. Pancreas: No pancreatic mass. No pancreatic ductal dilatation. No pancreatic or peripancreatic fluid or inflammatory changes. Spleen: Unremarkable. Adrenals/Urinary Tract: Bilateral adrenal masses are again noted, but significantly increased in size compared to the prior study, with the right adrenal mass now invading the right lobe of the liver (discussed above). The overall dimensions of the right adrenal mass (including the exclusively intrahepatic portion which was reported above) are approximately 6.2 x 6.0 x 8.1 cm (coronal image 100 of series 6 and axial image 49 of series 3). The dimensions of the left adrenal mass are currently 6.7 x 3.4 x 3.7 cm (axial image 53 of series 3 and coronal image 94 of series 6). Left kidney is normal in appearance. The previously described right adrenal mass is now causing  distortion of upper pole the right kidney such that early renal invasion is not entirely excluded (best appreciated on coronal image 105 of series 6). No hydroureteronephrosis. Urinary bladder is normal in appearance. Stomach/Bowel: The appearance of the stomach is normal. There is no pathologic dilatation of small bowel or colon. Numerous colonic diverticulae are noted, particularly in the sigmoid colon, without surrounding inflammatory changes to suggest an acute diverticulitis at this time. Normal appendix. Vascular/Lymphatic: Aortic atherosclerosis (mild), without evidence of aneurysm or dissection in the abdominal or pelvic vasculature. IVC filter noted, with tip terminating just below the level of the renal veins. Notably, there does not appear to be significant clot burden associated with the IVC filter at this time. Additionally, pelvic veins appear widely patent. Extensive retroperitoneal lymphadenopathy noted, with the largest lymph nodes measuring up to 1.7 cm in the left para-aortic nodal station (axial image 67 of series 3). Multiple other borderline enlarged upper abdominal ligament lymph nodes are also noted, nonspecific but suspicious. In the subcutaneous fat immediately anterior to the lower anterior abdominal wall in the pelvic region (axial image  109 of series 3) there is a soft tissue attenuation lesion that measures 1.5 cm in short axis (axial image 109 of series 3), new compared to the prior study, likely to represent enlarged lymph node. Reproductive: Probable fibroid uterus with multiple lesions, the largest of which measures 5.4 cm in the fundus of the uterus and is partially calcified. Retroverted uterus. Ovaries are unremarkable in appearance. Other: No significant volume of ascites. No pneumoperitoneum. Musculoskeletal: There are no aggressive appearing lytic or blastic lesions noted in the visualized portions of the skeleton. IMPRESSION: 1. Massive saddle pulmonary embolism with  additional clot burden extending throughout both lungs involving central, lobar, segmental and subsegmental sized vessels, with both occlusive and nonocclusive thrombus and evidence of right-sided heart strain (RV to LV ratio 1.21) consistent with at least submassive (intermediate risk) PE. The presence of right heart strain has been associated with an increased risk of morbidity and mortality. Please activate Code PE by paging [phone]. 2. Although the previously described mass in the superior segment of the right lower lobe appears relatively similar to prior examinations, there is clear progression of disease on today's study, with worsening postobstructive changes in the right lower lobe, worsening metastatic disease throughout the thoracic skeleton, enlarging bilateral adrenal metastasis (with direct invasion of the liver and potentially the superior aspect of the right kidney from the right adrenal mass), and worsening pelvic and retroperitoneal lymphadenopathy, as detailed above. 3. IVC filter appears appropriately positioned. Notably, there is no significant burden of clot associated with the filter at this time, and no evidence of deep venous thrombosis in the pelvic veins. 4. Extensive colonic diverticulosis without evidence of acute diverticulitis at this time. 5. Aortic atherosclerosis (mild). 6. Additional incidental findings, as above. Critical Value/emergent results were called by telephone at the time of interpretation on 01/28/2017 at 1:01 pm to Dr. Alvy Bimler and Dr. Nehemiah Massed (for Dr. Lindi Adie), who verbally acknowledged these results. Aortic Atherosclerosis (ICD10-I70.0). Electronically Signed   By: Vinnie Langton M.D.   On: 01/28/2017 13:40   Ir Ivc Filter Plmt / S&i /img Guid/mod Sed  Result Date: 01/27/2017 INDICATION: Acute femoral popliteal DVT, patient cannot be anticoagulated, hemorrhagic intracranial metastatic disease EXAM: ULTRASOUND GUIDANCE FOR VASCULAR ACCESS IVC CATHETERIZATION  AND VENOGRAM IVC FILTER INSERTION Date:  10/4/201810/07/2016 5:22 pm Radiologist:  M. Daryll Brod, MD Guidance:  Ultrasound and fluoroscopic CONTRAST:  65 cc Isovue-300 MEDICATIONS: 1% lidocaine local ANESTHESIA/SEDATION: Moderate Sedation Time: None. The patient was continuously monitored during the procedure by the interventional radiology nurse under my direct supervision. FLUOROSCOPY TIME:  Fluoroscopy Time: 3 minutes 24 seconds (25 mGy). COMPLICATIONS: None immediate. PROCEDURE: Informed consent was obtained from the patient following explanation of the procedure, risks, benefits and alternatives. The patient understands, agrees and consents for the procedure. All questions were addressed. A time out was performed. Maximal barrier sterile technique utilized including caps, mask, sterile gowns, sterile gloves, large sterile drape, hand hygiene, and betadine prep. Under sterile condition and local anesthesia, right internal jugular venous access was performed with ultrasound. Over a guide wire, the IVC filter delivery sheath and inner dilator were advanced into the IVC just above the IVC bifurcation. Contrast injection was performed for an IVC venogram. IVC VENOGRAM: The IVC is patent. No evidence of thrombus, stenosis, or occlusion. No variant venous anatomy. The renal veins are identified at L1. IVC FILTER INSERTION: Through the delivery sheath, the bard Denali IVC filter was deployed in the infrarenal IVC at the L2-3 level just below the renal veins  and above the IVC bifurcation. Contrast injection confirmed position. There is good apposition of the filter against the IVC. The delivery sheath was removed and hemostasis was obtained with compression for 5 minutes. The patient tolerated the procedure well. No immediate complications. IMPRESSION: Ultrasound and fluoroscopically guided infrarenal IVC filter insertion. PLAN: Due to patient related comorbidities and/or clinical necessity, this IVC filter should be  considered a permanent device. This patient will not be actively followed for future filter retrieval. Electronically Signed   By: Jerilynn Mages.  Shick M.D.   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 versus 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 I have extensive discussion with the patient and her daughters  We are in agreement, given her recent hemorrhagic stroke, anticoagulation 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 contraindicated  Without anticoagulation therapy, it is likely that her pulmonary emboli will get worse, leading to possible progressive cardiorespiratory failure and eventual fatal event We discussed goals of care and CODE STATUS The patient agreed to be DO NOT RESUSCITATE Upon further discussion with the patient about goals of care, it appears that the patient and family members are under the impression, after recent discussion with her oncologist last week, that they are hopeful that she can be started with palliative chemotherapy next week I felt that this is unrealistic expectation but I would defer further discussion to her oncologist next week I felt that the patient should focus on comfort measures but it appears that they are somewhat reluctant to accept this as she is "doing very well" with recent rehabilitation  Final recommendation #1 no anticoagulation therapy despite extensive  saddle emboli #2 palliative care consult to discuss further goals of care #3 Stop/hold PT/OT rehabilitation for now to reduce cardiac demand  This call if questions arise. To case above is discussed with primary service. I will alert her oncologist related to this weekend event  Heath Lark, MD 01/28/2017  4:21 PM

## 2017-01-28 NOTE — H&P (Addendum)
History and Physical    Kelly Stuart LZJ:[id number] DOB: [birth year] DOA: (Not on file)  Referring MD/NP/PA:  Patient coming from: Rehab 54M  Chief Complaint: saddle PE  HPI: Kelly Stuart is a 71 y.o. female with medical history significant of  [name] a 71 y.o.female prior h/o breast CA in 2013 s/p chemo followed by lumpectomy. 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 She was progressing slowly with PT and yesterday a CT chest was ordered by Dr.Gudena for staging, which this afternoon noted massive Saddle pulmonary embolism. TRH was called by covering Rehab MD to admit to hospital. Pt denies any dyspnea or chest pain at this time. She has L sided weakness for which she is working with Rehab  Review of Systems: As per HPI otherwise 14 point review of systems negative.   Past Medical History:  Diagnosis Date  . Anxiety   . Brain cancer ([employer])   . Breast cancer ([place])    ER+PR+ HER-2NEU negative  . Lung cancer [facility])     Past Surgical History:  Procedure Laterality Date  . BREAST LUMPECTOMY WITH NEEDLE LOCALIZATION  02/29/2012   Procedure: BREAST LUMPECTOMY WITH NEEDLE LOCALIZATION;  Surgeon: Rolm Bookbinder, MD;  Location: [facility];  Service: General;  Laterality: Left;  . BREAST SURGERY    . COSMETIC SURGERY    . DILATION AND CURETTAGE OF UTERUS  1998   submucus myoma-resected  . EYE SURGERY    . FACELIFT W/BLEPHAROPLASTY  1999      . IR IVC FILTER PLMT / S&I /IMG GUID/MOD SED  01/26/2017  . maxillofacial surgery  1980   mouth-ealign teeth-  . minifacelift  2007  . RADIOACTIVE  SEED GUIDED EXCISIONAL BREAST BIOPSY N/A 02/11/2016   Procedure: LEFT BREAST RADIOACTIVE SEED GUIDED EXCISIONAL BIOPSY;  Surgeon: Rolm Bookbinder, MD;  Location: [place] [place];  Service: General;  Laterality: N/A;  . TONSILLECTOMY       reports that she quit smoking about 25 years ago. Her smoking use included Cigarettes. She smoked 2.00 packs per day. She has never used smokeless tobacco. She reports that she drinks about 1.8 oz of alcohol per week . She reports that she does not use drugs.  Allergies  Allergen Reactions  . Codeine Nausea And Vomiting  . Penicillins Rash    Has patient had a PCN reaction causing immediate rash, facial/tongue/throat swelling, SOB or lightheadedness with hypotension: Yes Has patient had a PCN reaction causing severe rash involving mucus membranes or skin necrosis: No Has patient had a PCN reaction that required hospitalization: Yes Has patient had a PCN reaction occurring within the last 10 years: No If all of the above answers are "NO", then may proceed with Cephalosporin use.   . Sulfa Antibiotics Rash  . Tetracyclines & Related Rash    Patient states Acromycin    Family History  Problem Relation Age of Onset  . Cancer Mother   . Colon cancer Mother   . Cancer Father   . Colon cancer Father   . Hepatitis Father   . Hiatal hernia Father   . Rheumatic fever Sister   . Colon cancer Paternal Aunt  Prior to Admission medications   Medication Sig Start Date End Date Taking? Authorizing Provider  acetaminophen (TYLENOL) 325 MG tablet Take 2 tablets (650 mg total) by mouth every 6 (six) hours as needed for mild pain (or Fever >/= 101). 01/18/17   Robbie Lis, MD  amLODipine (NORVASC) 5 MG tablet Take 5 mg by mouth daily. 11/04/16   [provider]  dexamethasone (DECADRON) 4 MG tablet Take 1 tablet (4 mg total) by mouth every 12 (twelve) hours. 01/18/17   Robbie Lis, MD  escitalopram (LEXAPRO) 20 MG tablet Take 20 mg by  mouth daily.    [provider]  ibuprofen (ADVIL,MOTRIN) 200 MG tablet Take 200 mg by mouth 2 (two) times daily as needed for headache or moderate pain.    [provider]  levETIRAcetam (KEPPRA) 1000 MG tablet Take 1 tablet (1,000 mg total) by mouth 2 (two) times daily. 01/18/17   Robbie Lis, MD  pantoprazole (PROTONIX) 40 MG tablet Take 1 tablet (40 mg total) by mouth 2 (two) times daily. 01/18/17   Robbie Lis, MD  protein supplement shake (PREMIER PROTEIN) LIQD Take 325 mLs (11 oz total) by mouth daily. 01/18/17   Robbie Lis, MD    Physical Exam: There were no vitals filed for this visit.    Constitutional: frail, thinly built anxious upset female Eyes: PERRL, lids and conjunctivae normal ENMT: Mucous membranes are moist.  Neck: normal, supple Respiratory: diminished BS at bases Cardiovascular: Regular rate and rhythm, no murmurs / rubs / gallops Abdomen: soft, non tender, Bowel sounds positive.  Musculoskeletal: No joint deformity upper and lower extremities. Ext: no edema Skin: no rashes, lesions, ulcers.  Neurologic: L hemiparesis Psychiatric: anxious   Labs on Admission: I have personally reviewed following labs and imaging studies  CBC:  Recent Labs Lab 01/25/17 0652 01/27/17 0440  WBC 5.0 6.1  NEUTROABS 3.3 4.6  HGB 10.0* 10.0*  HCT 30.6* 31.2*  MCV 87.2 87.4  PLT 120* 151*   Basic Metabolic Panel:  Recent Labs Lab 01/25/17 0652 01/27/17 0440  NA 133* 133*  K 3.6 3.4*  CL 101 106  CO2 23 23  GLUCOSE 104* 111*  BUN 13 8  CREATININE 0.60 0.53  CALCIUM 8.4* 8.1*   GFR: Estimated Creatinine Clearance: 68.7 mL/min (by C-G formula based on SCr of 0.53 mg/dL). Liver Function Tests: No results for input(s): AST, ALT, ALKPHOS, BILITOT, PROT, ALBUMIN in the last 168 hours. No results for input(s): LIPASE, AMYLASE in the last 168 hours. No results for input(s): AMMONIA in the last 168 hours. Coagulation Profile: No results for  input(s): INR, PROTIME in the last 168 hours. Cardiac Enzymes: No results for input(s): CKTOTAL, CKMB, CKMBINDEX, TROPONINI in the last 168 hours. BNP (last 3 results) No results for input(s): PROBNP in the last 8760 hours. HbA1C: No results for input(s): HGBA1C in the last 72 hours. CBG:  Recent Labs Lab 01/23/17 0656 01/25/17 0641 01/26/17 0545 01/27/17 0706 01/28/17 0610  GLUCAP 106* 110* 114* 103* 91   Lipid Profile: No results for input(s): CHOL, HDL, LDLCALC, TRIG, CHOLHDL, LDLDIRECT in the last 72 hours. Thyroid Function Tests: No results for input(s): TSH, T4TOTAL, FREET4, T3FREE, THYROIDAB in the last 72 hours. Anemia Panel: No results for input(s): VITAMINB12, FOLATE, FERRITIN, TIBC, IRON, RETICCTPCT in the last 72 hours. Urine analysis:    Component Value Date/Time   COLORURINE YELLOW 01/26/2017 [place] 01/26/2017 1443   LABSPEC 1.005 01/26/2017 1443  PHURINE 8.0 01/26/2017 1443   GLUCOSEU NEGATIVE 01/26/2017 1443   HGBUR NEGATIVE 01/26/2017 1443   BILIRUBINUR NEGATIVE 01/26/2017 1443   BILIRUBINUR n 09/11/2014 0903   KETONESUR NEGATIVE 01/26/2017 1443   PROTEINUR NEGATIVE 01/26/2017 1443   UROBILINOGEN negative 09/11/2014 0903   NITRITE NEGATIVE 01/26/2017 1443   LEUKOCYTESUR NEGATIVE 01/26/2017 1443   Sepsis Labs: @LABRCNTIP (procalcitonin:4,lacticidven:4) ) Recent Results (from the past 240 hour(s))  Culture, Urine     Status: None   Collection Time: 01/26/17  2:43 PM  Result Value Ref Range Status   Specimen Description URINE, CATHETERIZED  Final   Special Requests NONE  Final   Culture NO GROWTH  Final   Report Status 01/27/2017 FINAL  Final     Radiological Exams on Admission: Ct Chest W Contrast  Result Date: 01/28/2017 CLINICAL DATA:  71 year-old female with history of lung cancer about to start chemotherapy. Baseline CT examination. EXAM: CT CHEST, ABDOMEN, AND PELVIS WITH CONTRAST TECHNIQUE: Multidetector CT imaging of  the chest, abdomen and pelvis was performed following the standard protocol during bolus administration of intravenous contrast. CONTRAST:  137mL ISOVUE-300 IOPAMIDOL (ISOVUE-300) INJECTION 61% COMPARISON:  Chest CT 11/02/2016.  PET-CT 11/23/2016. FINDINGS: CT CHEST FINDINGS Cardiovascular: Large filling defects in pulmonary artery branches throughout both lungs, including a large saddle embolus, as well as lobar, segmental and subsegmental sized emboli. Some of these appear occlusive (right lower lobe) while most are nonocclusive. Right ventricle appears mildly dilated when compared with the (RV diameter of approximately 5.2 cm versus LV diameter approximately 4.3 cm) resulting in an elevated RV to LV ratio of 1.21, concerning for right heart strain. Pulmonic trunk is not dilated at this time. There is no significant pericardial fluid, thickening or pericardial calcification. Aortic atherosclerosis. No definite coronary artery calcifications. Mediastinum/Nodes: Extensive nodal tissue is again noted throughout the right hilar region, with individual lymph nodes measuring up to at least 1 cm in short axis. No other mediastinal or left hilar lymphadenopathy is noted. Esophagus is unremarkable in appearance. No axillary lymphadenopathy. Lungs/Pleura: Previously noted mass in the superior segment of the right lower lobe currently measures 1.9 x 2.6 x 4.0 cm (axial image 64 of series 4 and coronal image 114 of series 6). Compared a prior examinations there is again an abundant infrahilar soft tissue adjacent to the right lower lobe bronchus, with increasing narrowing of the bronchus on today's study best appreciated on axial image 28 of series 3), and increasing postobstructive changes in the basal segments of the right lower lobe common most of which appear impacted with secretions. More distally in the basal segments of the right lower lobe there is some peribronchovascular micronodularity, likely reflective of  postobstructive changes. No other definite suspicious appearing pulmonary nodules or masses are noted in the left lung. No pleural effusions. Musculoskeletal: Multiple lytic lesions are noted throughout the visualized axial and appendicular skeleton, including a lesion in the posterior aspect of the T7 vertebral body, lesion in the posterior aspect of the T8 vertebral body on the left side which also involves the adjacent medial aspect of the left eighth rib which is expansile, lytic and fractured in multiple places (pathologic fracture) with extension into the overlying soft tissues (best appreciated on axial image 32 of series 3), lesion in the posterior aspect of T12 on the right side extending into the transverse process, and large lytic lesion in the T12 spinous process. All of these osseous changes appear progressive compared to the prior study from 11/02/2016. Surgical  clips in the left breast, presumably from prior lumpectomy. CT ABDOMEN PELVIS FINDINGS Hepatobiliary: When compared to prior examinations, the previously noted right adrenal mass has enlarged, and is now invasive into the medial aspect of the right lobe of the liver occupying predominantly segment 7, where the intrahepatic portion of the mass measures up to 8.1 x 6.0 x 3.4 cm (axial image 49 of series 3 and coronal image 99 of series 6). No other hepatic lesions are confidently identified. No intra or extrahepatic biliary ductal dilatation. Gallbladder is normal in appearance. Pancreas: No pancreatic mass. No pancreatic ductal dilatation. No pancreatic or peripancreatic fluid or inflammatory changes. Spleen: Unremarkable. Adrenals/Urinary Tract: Bilateral adrenal masses are again noted, but significantly increased in size compared to the prior study, with the right adrenal mass now invading the right lobe of the liver (discussed above). The overall dimensions of the right adrenal mass (including the exclusively intrahepatic portion which was  reported above) are approximately 6.2 x 6.0 x 8.1 cm (coronal image 100 of series 6 and axial image 49 of series 3). The dimensions of the left adrenal mass are currently 6.7 x 3.4 x 3.7 cm (axial image 53 of series 3 and coronal image 94 of series 6). Left kidney is normal in appearance. The previously described right adrenal mass is now causing distortion of upper pole the right kidney such that early renal invasion is not entirely excluded (best appreciated on coronal image 105 of series 6). No hydroureteronephrosis. Urinary bladder is normal in appearance. Stomach/Bowel: The appearance of the stomach is normal. There is no pathologic dilatation of small bowel or colon. Numerous colonic diverticulae are noted, particularly in the sigmoid colon, without surrounding inflammatory changes to suggest an acute diverticulitis at this time. Normal appendix. Vascular/Lymphatic: Aortic atherosclerosis (mild), without evidence of aneurysm or dissection in the abdominal or pelvic vasculature. IVC filter noted, with tip terminating just below the level of the renal veins. Notably, there does not appear to be significant clot burden associated with the IVC filter at this time. Additionally, pelvic veins appear widely patent. Extensive retroperitoneal lymphadenopathy noted, with the largest lymph nodes measuring up to 1.7 cm in the left para-aortic nodal station (axial image 67 of series 3). Multiple other borderline enlarged upper abdominal ligament lymph nodes are also noted, nonspecific but suspicious. In the subcutaneous fat immediately anterior to the lower anterior abdominal wall in the pelvic region (axial image 109 of series 3) there is a soft tissue attenuation lesion that measures 1.5 cm in short axis (axial image 109 of series 3), new compared to the prior study, likely to represent enlarged lymph node. Reproductive: Probable fibroid uterus with multiple lesions, the largest of which measures 5.4 cm in the fundus of  the uterus and is partially calcified. Retroverted uterus. Ovaries are unremarkable in appearance. Other: No significant volume of ascites. No pneumoperitoneum. Musculoskeletal: There are no aggressive appearing lytic or blastic lesions noted in the visualized portions of the skeleton. IMPRESSION: 1. Massive saddle pulmonary embolism with additional clot burden extending throughout both lungs involving central, lobar, segmental and subsegmental sized vessels, with both occlusive and nonocclusive thrombus and evidence of right-sided heart strain (RV to LV ratio 1.21) consistent with at least submassive (intermediate risk) PE. The presence of right heart strain has been associated with an increased risk of morbidity and mortality. Please activate Code PE by paging [phone]. 2. Although the previously described mass in the superior segment of the right lower lobe appears relatively similar to prior  examinations, there is clear progression of disease on today's study, with worsening postobstructive changes in the right lower lobe, worsening metastatic disease throughout the thoracic skeleton, enlarging bilateral adrenal metastasis (with direct invasion of the liver and potentially the superior aspect of the right kidney from the right adrenal mass), and worsening pelvic and retroperitoneal lymphadenopathy, as detailed above. 3. IVC filter appears appropriately positioned. Notably, there is no significant burden of clot associated with the filter at this time, and no evidence of deep venous thrombosis in the pelvic veins. 4. Extensive colonic diverticulosis without evidence of acute diverticulitis at this time. 5. Aortic atherosclerosis (mild). 6. Additional incidental findings, as above. Critical Value/emergent results were called by telephone at the time of interpretation on 01/28/2017 at 1:01 pm to Dr. Alvy Bimler and Dr. Nehemiah Massed (for Dr. Lindi Adie), who verbally acknowledged these results. Aortic Atherosclerosis  (ICD10-I70.0). Electronically Signed   By: Vinnie Langton M.D.   On: 01/28/2017 13:40   Ct Abdomen Pelvis W Contrast  Result Date: 01/28/2017 CLINICAL DATA:  71 year-old female with history of lung cancer about to start chemotherapy. Baseline CT examination. EXAM: CT CHEST, ABDOMEN, AND PELVIS WITH CONTRAST TECHNIQUE: Multidetector CT imaging of the chest, abdomen and pelvis was performed following the standard protocol during bolus administration of intravenous contrast. CONTRAST:  149mL ISOVUE-300 IOPAMIDOL (ISOVUE-300) INJECTION 61% COMPARISON:  Chest CT 11/02/2016.  PET-CT 11/23/2016. FINDINGS: CT CHEST FINDINGS Cardiovascular: Large filling defects in pulmonary artery branches throughout both lungs, including a large saddle embolus, as well as lobar, segmental and subsegmental sized emboli. Some of these appear occlusive (right lower lobe) while most are nonocclusive. Right ventricle appears mildly dilated when compared with the (RV diameter of approximately 5.2 cm versus LV diameter approximately 4.3 cm) resulting in an elevated RV to LV ratio of 1.21, concerning for right heart strain. Pulmonic trunk is not dilated at this time. There is no significant pericardial fluid, thickening or pericardial calcification. Aortic atherosclerosis. No definite coronary artery calcifications. Mediastinum/Nodes: Extensive nodal tissue is again noted throughout the right hilar region, with individual lymph nodes measuring up to at least 1 cm in short axis. No other mediastinal or left hilar lymphadenopathy is noted. Esophagus is unremarkable in appearance. No axillary lymphadenopathy. Lungs/Pleura: Previously noted mass in the superior segment of the right lower lobe currently measures 1.9 x 2.6 x 4.0 cm (axial image 64 of series 4 and coronal image 114 of series 6). Compared a prior examinations there is again an abundant infrahilar soft tissue adjacent to the right lower lobe bronchus, with increasing narrowing of the  bronchus on today's study best appreciated on axial image 28 of series 3), and increasing postobstructive changes in the basal segments of the right lower lobe common most of which appear impacted with secretions. More distally in the basal segments of the right lower lobe there is some peribronchovascular micronodularity, likely reflective of postobstructive changes. No other definite suspicious appearing pulmonary nodules or masses are noted in the left lung. No pleural effusions. Musculoskeletal: Multiple lytic lesions are noted throughout the visualized axial and appendicular skeleton, including a lesion in the posterior aspect of the T7 vertebral body, lesion in the posterior aspect of the T8 vertebral body on the left side which also involves the adjacent medial aspect of the left eighth rib which is expansile, lytic and fractured in multiple places (pathologic fracture) with extension into the overlying soft tissues (best appreciated on axial image 32 of series 3), lesion in the posterior aspect of T12 on  the right side extending into the transverse process, and large lytic lesion in the T12 spinous process. All of these osseous changes appear progressive compared to the prior study from 11/02/2016. Surgical clips in the left breast, presumably from prior lumpectomy. CT ABDOMEN PELVIS FINDINGS Hepatobiliary: When compared to prior examinations, the previously noted right adrenal mass has enlarged, and is now invasive into the medial aspect of the right lobe of the liver occupying predominantly segment 7, where the intrahepatic portion of the mass measures up to 8.1 x 6.0 x 3.4 cm (axial image 49 of series 3 and coronal image 99 of series 6). No other hepatic lesions are confidently identified. No intra or extrahepatic biliary ductal dilatation. Gallbladder is normal in appearance. Pancreas: No pancreatic mass. No pancreatic ductal dilatation. No pancreatic or peripancreatic fluid or inflammatory changes.  Spleen: Unremarkable. Adrenals/Urinary Tract: Bilateral adrenal masses are again noted, but significantly increased in size compared to the prior study, with the right adrenal mass now invading the right lobe of the liver (discussed above). The overall dimensions of the right adrenal mass (including the exclusively intrahepatic portion which was reported above) are approximately 6.2 x 6.0 x 8.1 cm (coronal image 100 of series 6 and axial image 49 of series 3). The dimensions of the left adrenal mass are currently 6.7 x 3.4 x 3.7 cm (axial image 53 of series 3 and coronal image 94 of series 6). Left kidney is normal in appearance. The previously described right adrenal mass is now causing distortion of upper pole the right kidney such that early renal invasion is not entirely excluded (best appreciated on coronal image 105 of series 6). No hydroureteronephrosis. Urinary bladder is normal in appearance. Stomach/Bowel: The appearance of the stomach is normal. There is no pathologic dilatation of small bowel or colon. Numerous colonic diverticulae are noted, particularly in the sigmoid colon, without surrounding inflammatory changes to suggest an acute diverticulitis at this time. Normal appendix. Vascular/Lymphatic: Aortic atherosclerosis (mild), without evidence of aneurysm or dissection in the abdominal or pelvic vasculature. IVC filter noted, with tip terminating just below the level of the renal veins. Notably, there does not appear to be significant clot burden associated with the IVC filter at this time. Additionally, pelvic veins appear widely patent. Extensive retroperitoneal lymphadenopathy noted, with the largest lymph nodes measuring up to 1.7 cm in the left para-aortic nodal station (axial image 67 of series 3). Multiple other borderline enlarged upper abdominal ligament lymph nodes are also noted, nonspecific but suspicious. In the subcutaneous fat immediately anterior to the lower anterior abdominal wall  in the pelvic region (axial image 109 of series 3) there is a soft tissue attenuation lesion that measures 1.5 cm in short axis (axial image 109 of series 3), new compared to the prior study, likely to represent enlarged lymph node. Reproductive: Probable fibroid uterus with multiple lesions, the largest of which measures 5.4 cm in the fundus of the uterus and is partially calcified. Retroverted uterus. Ovaries are unremarkable in appearance. Other: No significant volume of ascites. No pneumoperitoneum. Musculoskeletal: There are no aggressive appearing lytic or blastic lesions noted in the visualized portions of the skeleton. IMPRESSION: 1. Massive saddle pulmonary embolism with additional clot burden extending throughout both lungs involving central, lobar, segmental and subsegmental sized vessels, with both occlusive and nonocclusive thrombus and evidence of right-sided heart strain (RV to LV ratio 1.21) consistent with at least submassive (intermediate risk) PE. The presence of right heart strain has been associated with an increased  risk of morbidity and mortality. Please activate Code PE by paging [phone]. 2. Although the previously described mass in the superior segment of the right lower lobe appears relatively similar to prior examinations, there is clear progression of disease on today's study, with worsening postobstructive changes in the right lower lobe, worsening metastatic disease throughout the thoracic skeleton, enlarging bilateral adrenal metastasis (with direct invasion of the liver and potentially the superior aspect of the right kidney from the right adrenal mass), and worsening pelvic and retroperitoneal lymphadenopathy, as detailed above. 3. IVC filter appears appropriately positioned. Notably, there is no significant burden of clot associated with the filter at this time, and no evidence of deep venous thrombosis in the pelvic veins. 4. Extensive colonic diverticulosis without evidence  of acute diverticulitis at this time. 5. Aortic atherosclerosis (mild). 6. Additional incidental findings, as above. Critical Value/emergent results were called by telephone at the time of interpretation on 01/28/2017 at 1:01 pm to Dr. Alvy Bimler and Dr. Nehemiah Massed (for Dr. Lindi Adie), who verbally acknowledged these results. Aortic Atherosclerosis (ICD10-I70.0). Electronically Signed   By: Vinnie Langton M.D.   On: 01/28/2017 13:40   Ir Ivc Filter Plmt / S&i /img Guid/mod Sed  Result Date: 01/27/2017 INDICATION: Acute femoral popliteal DVT, patient cannot be anticoagulated, hemorrhagic intracranial metastatic disease EXAM: ULTRASOUND GUIDANCE FOR VASCULAR ACCESS IVC CATHETERIZATION AND VENOGRAM IVC FILTER INSERTION Date:  10/4/201810/07/2016 5:22 pm Radiologist:  M. Daryll Brod, MD Guidance:  Ultrasound and fluoroscopic CONTRAST:  65 cc Isovue-300 MEDICATIONS: 1% lidocaine local ANESTHESIA/SEDATION: Moderate Sedation Time: None. The patient was continuously monitored during the procedure by the interventional radiology nurse under my direct supervision. FLUOROSCOPY TIME:  Fluoroscopy Time: 3 minutes 24 seconds (25 mGy). COMPLICATIONS: None immediate. PROCEDURE: Informed consent was obtained from the patient following explanation of the procedure, risks, benefits and alternatives. The patient understands, agrees and consents for the procedure. All questions were addressed. A time out was performed. Maximal barrier sterile technique utilized including caps, mask, sterile gowns, sterile gloves, large sterile drape, hand hygiene, and betadine prep. Under sterile condition and local anesthesia, right internal jugular venous access was performed with ultrasound. Over a guide wire, the IVC filter delivery sheath and inner dilator were advanced into the IVC just above the IVC bifurcation. Contrast injection was performed for an IVC venogram. IVC VENOGRAM: The IVC is patent. No evidence of thrombus, stenosis, or occlusion. No  variant venous anatomy. The renal veins are identified at L1. IVC FILTER INSERTION: Through the delivery sheath, the bard Denali IVC filter was deployed in the infrarenal IVC at the L2-3 level just below the renal veins and above the IVC bifurcation. Contrast injection confirmed position. There is good apposition of the filter against the IVC. The delivery sheath was removed and hemostasis was obtained with compression for 5 minutes. The patient tolerated the procedure well. No immediate complications. IMPRESSION: Ultrasound and fluoroscopically guided infrarenal IVC filter insertion. PLAN: Due to patient related comorbidities and/or clinical necessity, this IVC filter should be considered a permanent device. This patient will not be actively followed for future filter retrieval. Electronically Signed   By: Jerilynn Mages.  Shick M.D.   On: 01/27/2017 07:46    Assessment/Plan Principal Problem:   Acute saddle pulmonary embolism ([place]) -she is critically ill with very grave prognosis  -s/p IVC filter 10/4 -unfortunately unable to anticoagulate due to hemorrhagic brain mets -I called and discussed her case with Dr. Cecil Cobbs with Neuro oncology who follows the patient he recommended against anticoagulation,  Recommended  palliative care discussions -I also called and discussed case with Dr. Roselie Awkward with pulmonary critical care on-call today, he also agreed that unfortunately with hemorrhagic metastasis and saddle PE and already having an IVC filter, nothing else medically to offer her at this time -I called and discussed case with oncology Dr. 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

## 2017-01-28 NOTE — Plan of Care (Signed)
Problem: RH BOWEL ELIMINATION Goal: RH STG MANAGE BOWEL WITH ASSISTANCE STG Manage Bowel with Mod Assistance.   Outcome: Not Progressing Pt still has bouts of incontinent diarrhea at times.

## 2017-01-28 NOTE — Plan of Care (Signed)
Problem: RH BOWEL ELIMINATION Goal: RH STG MANAGE BOWEL WITH ASSISTANCE STG Manage Bowel with Mod Assistance.   Outcome: Not Progressing Incontinent of BM x 2 requiring total assistance.

## 2017-01-28 NOTE — Consult Note (Addendum)
Consultation Note Date: 01/28/2017   Patient Name: Kelly Stuart  DOB: [birth year]  MRN: [id number]  Age / Sex: 71 y.o., female  PCP: Jonathon Jordan, MD Referring Physician: Domenic Polite, MD  Reason for Consultation: Establishing goals of care  HPI/Patient Profile: 71 y.o. female  admitted on 01/28/2017   Clinical Assessment and Goals of Care:  71 year old lady with a past medical history significant for metastatic lung cancer, noted to have metastatic disease to the brain, was initially hospitalized at [facility] long hospital for focal seizures and generalized weakness, was started on Keppra and Decadron, noted to have hemorrhagic conversion of her metastatic brain disease, was transferred to Inpatient rehabilitation, was continued on steroids and antiepileptic drugs. A few days ago, she was diagnosed with DVT lower extremity, she underwent IVC placement.  On 01-27-17 CT scan of the chest was ordered by her primary medical oncologist Dr. Lindi Adie as part of restaging. It has shown massive saddle pulmonary embolism, evidence of right heart strain, in comparison with imaging that was obtained in July and August 2018, the CT scan of the chest shows worsening metastatic disease throughout the thoracic skeleton, increased size of right lower lobe mass.  Hence, the patient is to be transitioned out of inpatient rehabilitation to a medical telemetry floor-stepdown unit. A palliative consultation has been requested for symptom management, goals of care discussions, to provide an additional layer of support for the patient and family.  The patient is awake alert sitting up in bed. She appears visibly anxious. I introduce myself and palliative care as follows: Palliative medicine is specialized medical care for people living with serious illness. It focuses on providing relief from the symptoms and stress of a serious illness.  The goal is to improve quality of life for both the patient and the family.  Patient is anxious and tearful. She states that for her part she has tried to stay positive and has had a motivated demeanor. She has participated fully in rehabilitation. We discussed about the CT scan results. Goals wishes and values discussed. Patient's sisters were also her healthcare power of attorney agents present at the bedside offer her tremendous support and presence. We discussed about taking a step-by-step approach, making sure we had appropriate medicine such as opioids and benzodiazepines to be used only on an as-needed basis to counter symptoms such as shortness of breath and anxiety. Patient states she does not want narcotics. Explained about adequate symptom management.  Patient and family are awaiting further discussions with medical oncology. They do however understand that the patient has recent hemorrhagic transformation of her brain metastases, likely not a candidate for extensive oral anticoagulation. She has IVC filter placed.  We discussed gently about full scope of comfort measures and avoiding suffering and making sure that we have adequate pain management and on pain symptom management at all times. Offered active listening and supportive care.  See recommendations below. Thank you for the consult.  HCPOA  sister Kelly Stuart  SUMMARY OF RECOMMENDATIONS    Agree with DNR  DNI Add low dose fentanyl IV PRN  Add low dose PO Ativan Continue current scope of care Patient and family wish to further discuss recent imaging with medical oncology Spiritual care, if desired by patient and family HCPOA is 2 sisters present at bedside, first established HCPOA is sister Kelly Stuart.   Code Status/Advance Care Planning:  DNR    Symptom Management:    as above   Palliative Prophylaxis:   Bowel Regimen  Psycho-social/Spiritual:   Desire for further Chaplaincy support:yes  Additional Recommendations:  Caregiving  Support/Resources  Prognosis:   Guarded, at risk for sudden death, given massive saddle embolus, R heart strain, wide spread malignancy.   Discharge Planning: Anticipated Hospital Death  2022-09-04 qualify for residential hospice, based on further hospital course, further discussions with patient and family.       Primary Diagnoses: Present on Admission: . Metastatic lung cancer (metastasis from lung to other site), unspecified laterality ([place]) . Brain metastases ([place]) . 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 Acute saddle pulmonary embolism ([facility]) . PE (pulmonary thromboembolism) ([place])   I have reviewed the medical record, interviewed the patient and family, and examined the patient. The following aspects are pertinent.  Past Medical History:  Diagnosis Date  . Anxiety   . Brain cancer ([place])   . Breast cancer ([place])    ER+PR+ HER-2NEU negative  . Lung cancer [facility])    Social History   Social History  . Marital status: Divorced    Spouse name: N/A  . Number of children: 1  . Years of education: N/A   Occupational History  .  [employer]iological scientist  .      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 Smoking status: Former Smoker    Packs/day: 2.00    Types: Cigarettes    Quit date: 11/01/1991  . Smokeless tobacco: Never Used  . Alcohol use 1.8 oz/week    3 Cans of beer per week     Comment: drinks beer daily  . Drug use: No  . Sexual activity: Yes    Partners: Male   Other Topics Concern  . Not on file   Social History Narrative  . No narrative on file   Family History  Problem Relation Age of Onset  . Cancer Mother   . Colon cancer Mother   . Cancer Father   . Colon cancer Father   . Hepatitis Father   . Hiatal hernia Father   . Rheumatic fever Sister   . Colon cancer Paternal Aunt    Scheduled Meds: . dexamethasone  4 mg Oral Q12H  . escitalopram  20 mg Oral  Daily  . levETIRAcetam  1,000 mg Oral BID  . pantoprazole  40 mg Oral BID  . protein supplement shake  11 oz Oral Q24H   Continuous Infusions: . sodium chloride     PRN Meds:.acetaminophen **OR** acetaminophen, acetaminophen, fentaNYL (SUBLIMAZE) injection, LORazepam, ondansetron **OR** ondansetron (ZOFRAN) IV Medications Prior to Admission:  Prior to Admission medications   Medication Sig Start Date End Date Taking? Authorizing Provider  acetaminophen (TYLENOL) 325 MG tablet Take 2 tablets (650 mg total) by mouth every 6 (six) hours as needed for mild pain (or Fever >/= 101). 01/18/17   Robbie Lis, MD  amLODipine (NORVASC) 5 MG tablet Take 5 mg by mouth daily. 11/04/16   [provider]  dexamethasone (DECADRON) 4 MG tablet Take 1 tablet (  4 mg total) by mouth every 12 (twelve) hours. 01/18/17   Robbie Lis, MD  escitalopram (LEXAPRO) 20 MG tablet Take 20 mg by mouth daily.    [provider]  ibuprofen (ADVIL,MOTRIN) 200 MG tablet Take 200 mg by mouth 2 (two) times daily as needed for headache or moderate pain.    [provider]  levETIRAcetam (KEPPRA) 1000 MG tablet Take 1 tablet (1,000 mg total) by mouth 2 (two) times daily. 01/18/17   Robbie Lis, MD  pantoprazole (PROTONIX) 40 MG tablet Take 1 tablet (40 mg total) by mouth 2 (two) times daily. 01/18/17   Robbie Lis, MD  protein supplement shake (PREMIER PROTEIN) LIQD Take 325 mLs (11 oz total) by mouth daily. 01/18/17   Robbie Lis, MD   Allergies  Allergen Reactions  . Codeine Nausea And Vomiting  . Penicillins Rash    Has patient had a PCN reaction causing immediate rash, facial/tongue/throat swelling, SOB or lightheadedness with hypotension: Yes Has patient had a PCN reaction causing severe rash involving mucus membranes or skin necrosis: No Has patient had a PCN reaction that required hospitalization: Yes Has patient had a PCN reaction occurring within the last 10 years: No If all of the  above answers are "NO", then may proceed with Cephalosporin use.   . Sulfa Antibiotics Rash  . Tetracyclines & Related Rash    Patient states Acromycin   Review of Systems +mild pain +mild episodic exertional dyspnea  Physical Exam Thin, frail elderly appearing lady resting in bed Patient has look of panic, appears visibly anxious Diminished breath sounds S1-S2 Abdomen soft nontender Trace edema Moves all extremities spontaneously Flat affect  Vital Signs: BP 102/61 (BP Location: Right Arm)   Temp 97.8 F (36.6 C) (Oral)   Resp (!) 25   LMP 08/23/2008   SpO2 96%  Pain Assessment: No/denies pain       SpO2: SpO2: 96 % O2 Device:SpO2: 96 % O2 Flow Rate: .   IO: Intake/output summary:  Intake/Output Summary (Last 24 hours) at 01/28/17 1556 Last data filed at 01/28/17 1549  Gross per 24 hour  Intake                0 ml  Output              800 ml  Net             -800 ml   Palliative performance scale 30% LBM:   Baseline Weight:   Most recent weight:       Palliative Assessment/Data:     Time In: 1400 Time Out:  1510 Time Total:  70 Greater than 50%  of this time was spent counseling and coordinating care related to the above assessment and plan.  Signed by: Loistine Chance, MD  [phone] Please contact Palliative Medicine Team phone at [phone] for questions and concerns.  For individual provider: See Shea Evans

## 2017-01-28 NOTE — Progress Notes (Signed)
Speech Language Pathology Daily Session Note  Patient Details  Name: Kelly Stuart MRN: [id number] Date of Birth: 1946-03-26  Today's Date: 01/28/2017 SLP Individual Time: 1130-1155 SLP Individual Time Calculation (min): 25 min  Short Term Goals: Week 1: SLP Short Term Goal 1 (Week 1): STG=LTGs  Skilled Therapeutic Interventions: Skilled treatment session focused on cognitive goals. Patient was Mod I for recall of her current medications and their functions but required Max A multimodal cues for organization and problem solving with organizing a BID pill box. Task will be completed at next scheduled session. Patient left upright in bed with all needs within reach. Continue with current plan of care.      Function:  Cognition Comprehension Comprehension assist level: Understands basic 75 - 89% of the time/ requires cueing 10 - 24% of the time  Expression   Expression assist level: Expresses basic 90% of the time/requires cueing < 10% of the time.  Social Interaction Social Interaction assist level: Interacts appropriately 75 - 89% of the time - Needs redirection for appropriate language or to initiate interaction.  Problem Solving Problem solving assist level: Solves basic 50 - 74% of the time/requires cueing 25 - 49% of the time  Memory Memory assist level: Recognizes or recalls 50 - 74% of the time/requires cueing 25 - 49% of the time    Pain Pain Assessment Pain Assessment: No/denies pain Pain Score: 0-No pain  Therapy/Group: Individual Therapy  Kellsie Grindle 01/28/2017, 11:55 AM

## 2017-01-29 ENCOUNTER — Encounter (HOSPITAL_COMMUNITY): Payer: Self-pay

## 2017-01-29 ENCOUNTER — Inpatient Hospital Stay (HOSPITAL_COMMUNITY): Payer: Medicare Other | Admitting: Occupational Therapy

## 2017-01-29 DIAGNOSIS — Z515 Encounter for palliative care: Secondary | ICD-10-CM

## 2017-01-29 NOTE — Progress Notes (Signed)
Daily Progress Note   Patient Name: Kelly Stuart       Date: 01/29/2017 DOB: 1945-07-02  Age: 71 y.o. MRN#: [id number] Attending Physician: Domenic Polite, MD Primary Care Physician: Jonathon Jordan, MD Admit Date: 01/28/2017  Reason for Consultation/Follow-up: Non pain symptom management, Pain control and Psychosocial/spiritual support  Subjective: Chart reviewed. Pt seen. Pt is a/o x 3. Her son and ex-husband were at the bedside. Son visibly upset.Pt has not utilized any Fentanyl. Using Tylenol for pain. 1 PRN ativan noted for anxiety/sleep. Did provide education on opioids for dual usage of both pain but as well as dyspnea. She reports that she asked for oxygen last night. Also discussed that anxiety is often precipitated by hypoxia/dyspnea as well as steroids.  Length of Stay: 1  Current Medications: Scheduled Meds:  . dexamethasone  4 mg Oral Q12H  . escitalopram  20 mg Oral Daily  . levETIRAcetam  1,000 mg Oral BID  . pantoprazole  40 mg Oral BID  . protein supplement shake  11 oz Oral Q24H    Continuous Infusions: . sodium chloride      PRN Meds: acetaminophen **OR** acetaminophen, fentaNYL (SUBLIMAZE) injection, LORazepam, ondansetron **OR** ondansetron (ZOFRAN) IV  Physical Exam  Constitutional: She is oriented to person, place, and time. She appears well-developed and well-nourished.  Neck: Normal range of motion.  Cardiovascular: Normal rate.   Pulmonary/Chest:  Mild increased work of breathing at rest  Musculoskeletal: Normal range of motion.  Neurological: She is alert and oriented to person, place, and time.  Skin: Skin is warm and dry.  Psychiatric:  anxious  Nursing note and vitals reviewed.           Vital Signs: BP (!) 92/53 (BP Location: Right Arm)    Pulse 78   Temp 98.4 F (36.9 C) (Oral)   Resp 20   Ht 5\' 10"  (1.778 m)   Wt 65.8 kg (145 lb)   LMP 08/23/2008   SpO2 95%   BMI 20.81 kg/m  SpO2: SpO2: 95 % O2 Device: O2 Device: Nasal Cannula O2 Flow Rate: O2 Flow Rate (L/min): 2 L/min  Intake/output summary:  Intake/Output Summary (Last 24 hours) at 01/29/17 1349 Last data filed at 01/29/17 0347  Gross per 24 hour  Intake  720 ml  Output             2000 ml  Net            -1280 ml   LBM:   Baseline Weight: Weight: 65.8 kg (145 lb) Most recent weight: Weight: 65.8 kg (145 lb)       Palliative Assessment/Data:      Patient Active Problem List   Diagnosis Date Noted  . Acute saddle pulmonary embolism ([place]) 01/28/2017  . PE (pulmonary thromboembolism) ([place]) 01/28/2017  . Encounter for palliative care   . Goals of care, counseling/discussion   . Anxiety disorder due to known physiological condition   . Metastatic cancer to brain ([place]) 01/18/2017  . Malnutrition of moderate degree 01/17/2017  . Hemorrhage, intracerebral ([place]) 01/16/2017  . 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 Left hemiplegia ([place]) 01/15/2017  . Acute left-sided weakness 01/15/2017  . 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 Fatigue 01/06/2017  . Focal seizures ([place]) 12/30/2016  . 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 Brain metastases ([place]) 12/13/2016  . Metastatic lung cancer (metastasis from lung to other site), unspecified laterality ([place]) 11/28/2016  . Lung nodules 11/14/2016  . Primary cancer of upper outer quadrant of left female breast ([place]) 05/05/2011    Palliative Care Assessment & Plan   Patient Profile: 71 year old lady with a past medical history significant for metastatic lung cancer, noted to have metastatic disease to the brain, was initially hospitalized at [facility] long hospital for focal seizures and generalized weakness, was  started on Keppra and Decadron, noted to have hemorrhagic conversion of her metastatic brain disease, was transferred to Inpatient rehabilitation, was continued on steroids and antiepileptic drugs. A few days ago, she was diagnosed with DVT lower extremity, she underwent IVC placement.  On 01-27-17 CT scan of the chest was ordered by her primary medical oncologist Dr. Payton Mccallum as part of restaging. It has shown massive saddle pulmonary embolism, evidence of right heart strain, in comparison with imaging that was obtained in July and August 2018, the CT scan of the chest shows worsening metastatic disease throughout the thoracic skeleton, increased size of right lower lobe mass.  Hence, the patient is to be transitioned out of inpatient rehabilitation to a medical telemetry floor-stepdown unit. A palliative consultation has been requested for symptom management, goals of care discussions, to provide an additional layer of support for the patient and family.  Patient and family are awaiting further discussions with medical oncology. They do however understand that the patient has recent hemorrhagic transformation of her brain metastases, likely not a candidate for extensive oral anticoagulation. Heart he has IVC filter placed.   Recommendations/Plan:  Pain/dyspnea: Continue with low-dose fentanyl 12.7mcg PRN as well as supplemental 02 as needed; tylenol PRN and decadron  Anxiety: Cont with ativan PRN but consider if hypoxia underlying cause of anxiety with trial of fentnayl   Code Status:    Code Status Orders        Start     Ordered   01/28/17 1549  Do not attempt resuscitation (DNR)  Continuous    Question Answer Comment  In the event of cardiac or respiratory ARREST Do not call a "code blue"   In the event of cardiac or respiratory ARREST Do not perform Intubation, CPR, defibrillation or ACLS   In the event of cardiac or respiratory ARREST Use medication by any route, position, wound  care, and other measures to relive pain and suffering. May use oxygen, suction and manual treatment of airway obstruction as needed for comfort.      01/28/17 1549    Code Status History    Date Active Date Inactive Code Status Order ID Comments User Context   01/28/2017  2:08 PM 01/28/2017  3:44 PM DNR [id number]  Domenic Polite, MD Inpatient   01/18/2017  5:35 PM 01/18/2017  5:35 PM Full Code [id number]  Bary Leriche, PA-C Inpatient   01/18/2017  5:35 PM 01/28/2017  2:08 PM Full Code [id number]  Bary Leriche, PA-C Inpatient   01/15/2017  5:52 PM 01/18/2017  5:17 PM Full Code [id number]  Louellen Molder, MD Inpatient   12/30/2016 11:45 PM 12/31/2016  6:52 PM Full Code [id number]  Etta Quill, DO ED       Prognosis:  Grim prognosis. Pt at high risk for cardiopulmonary failure 2/2 massive saddle PE in setting of breast cancer to metastatic disease to lung, brain, bone lymph, adrenals, h/o DVT ( IVC)  Discharge Planning:  Anticipated Hospital Death vs residential hospice  Care plan was discussed with Dr. Rowe Pavy  Thank you for allowing the Palliative Medicine Team to assist in the care of this patient.   Time In: 1330 Time Out: 1400 Total Time 30 min Prolonged Time Billed  no       Greater than 50%  of this time was spent counseling and coordinating care related to the above assessment and plan.  Dory Horn, NP  Please contact Palliative Medicine Team phone at [phone] for questions and concerns.

## 2017-01-29 NOTE — Progress Notes (Addendum)
PROGRESS NOTE    Kelly Stuart  KGY:[id number] DOB: [birth year] DOA: 01/28/2017 PCP: Jonathon Jordan, MD  Brief Narrative:Kelly Stuart is a 71 y.o. female with medical history significant of [name] a 71 y.o.female prior h/o breast CA in 2013 s/p chemofollowed by lumpectomy. In July 2018 was diagnosed with metastatic Lung Ca and in 11/2016 found to have brain mets was hospitalized for focal seizures and generalized weakness, started on Keppra and Decadron, then had radiation to the brain 9/10 onwards, after this was admitted with L sided weakness on 9/23, found to have 4.5cm R fronto-parietal hemorrhagic metastasis-decadron dose increased and keppra increased, then discahrged to Rehab 9/26. She was progressing slowly with PT and had a CT chest was ordered by Kelly Stuart for staging, which 10/6 afternoon noted massive Saddle pulmonary embolism. TR[facility] was called by covering Rehab MD to admit to hospital 10/6 afternoon, Palliative, Oncology consulted, case discussed with PCCM and Kelly Stuart neuro-oncology   Assessment & Plan:   Acute saddle pulmonary embolism ([name]) -she is critically ill with very grave prognosis  -s/p IVC filter 10/4 -unfortunately unable to anticoagulate due to hemorrhagic brain mets and not candidate for any TPA Rx -Yesterday I called and discussed her case with Dr. Cecil Stuart with Neuro oncology who follows the patient he recommended against anticoagulation,  Recommended palliative care discussions, I also called and discussed case with Dr. Roselie Stuart with pulmonary critical care on-call, he also agreed that unfortunately with hemorrhagic metastasis and saddle PE and already having an IVC filter, nothing else medically to offer her at this time -Oncology Dr. 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 -anticipate hospital death, at some point she will become symptomatic     Metastatic lung cancer (metastasis from lung to other site), unspecified laterality [name]) -Oncology consulted, poor prognosis, see above discussion    DVT  -s/p IVC filter 10/4    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 -not anticoagulation candidate due to this -continue Keppra and Decadron  DVT prophylaxis: SCDs Code Status: DNR  Family Communication: d/w sisters at bedside  Disposition Plan: anticipate hospital death  Consultants : Palliative, Oncology, d/w PCCM and Kelly Stuart-Neuro/Onc    Procedures:   Antimicrobials:    Subjective: Feels ok, remains tearful and anxious otherwise, denies dyspnea or chest pain  Objective: Vitals:   01/28/17 1547 01/28/17 1959 01/29/17 0012 01/29/17 0347  BP: 102/61 106/68 (!) 109/91 (!) 92/53  Pulse:  78 78 78  Resp: (!) 25 17 (!) 24 20  Temp: 97.8 F (36.6 C) 98.4 F (36.9 C) 98 F (36.7 C) 98.4 F (36.9 C)  TempSrc: Oral Oral Oral Oral  SpO2: 96% 93% 94% 95%  Weight: 65.8 kg (145 lb)     Height: 5\' 10"  (1.778 m)       Intake/Output Summary (Last 24 hours) at 01/29/17 1055 Last data filed at 01/29/17 0347  Gross per 24 hour  Intake              720 ml  Output             2000 ml  Net            -1280 ml  Filed Weights   01/28/17 1547  Weight: 65.8 kg (145 lb)    Examination:  General exam: frail tearful, anxious Respiratory system: Clear to auscultation. Respiratory effort normal. Cardiovascular system: S1 & S2 heard,   Gastrointestinal system: Abdomen is nondistended, soft and nontender.Normal bowel sounds heard. Central nervous system: L hemiparesis Extremities: no edema Skin: No rashes, lesions or ulcers Psychiatry: tearful and sad    Data Reviewed:    CBC:  Recent Labs Lab 01/25/17 0652 01/27/17 0440  WBC 5.0 6.1  NEUTROABS 3.3 4.6  HGB 10.0* 10.0*  HCT 30.6* 31.2*  MCV 87.2 87.4  PLT 120* 762*   Basic Metabolic Panel:  Recent Labs Lab 01/25/17 0652 01/27/17 0440  NA 133* 133*  K 3.6 3.4*  CL 101 106  CO2 23 23  GLUCOSE 104* 111*  BUN 13 8  CREATININE 0.60 0.53  CALCIUM 8.4* 8.1*   GFR: Estimated Creatinine Clearance: 66 mL/min (by C-G formula based on SCr of 0.53 mg/dL). Liver Function Tests: No results for input(s): AST, ALT, ALKPHOS, BILITOT, PROT, ALBUMIN in the last 168 hours. No results for input(s): LIPASE, AMYLASE in the last 168 hours. No results for input(s): AMMONIA in the last 168 hours. Coagulation Profile: No results for input(s): INR, PROTIME in the last 168 hours. Cardiac Enzymes: No results for input(s): CKTOTAL, CKMB, CKMBINDEX, TROPONINI in the last 168 hours. BNP (last 3 results) No results for input(s): PROBNP in the last 8760 hours. HbA1C: No results for input(s): HGBA1C in the last 72 hours. CBG:  Recent Labs Lab 01/23/17 0656 01/25/17 0641 01/26/17 0545 01/27/17 0706 01/28/17 0610  GLUCAP 106* 110* 114* 103* 91   Lipid Profile: No results for input(s): CHOL, HDL, LDLCALC, TRIG, CHOLHDL, LDLDIRECT in the last 72 hours. Thyroid Function Tests: No results for input(s): TSH, T4TOTAL, FREET4, T3FREE, THYROIDAB in the last 72 hours. Anemia Panel: No results for input(s): VITAMINB12, FOLATE, FERRITIN, TIBC, IRON, RETICCTPCT in the last 72 hours. Urine analysis:    Component Value Date/Time   COLORURINE YELLOW 01/26/2017 [place] 01/26/2017 1443   LABSPEC 1.005 01/26/2017 1443   PHURINE 8.0 01/26/2017 1443   GLUCOSEU NEGATIVE 01/26/2017 1443   HGBUR NEGATIVE 01/26/2017 1443   BILIRUBINUR NEGATIVE 01/26/2017 1443   BILIRUBINUR n 09/11/2014 0903   KETONESUR NEGATIVE 01/26/2017 1443   PROTEINUR NEGATIVE 01/26/2017 1443   UROBILINOGEN negative 09/11/2014 0903    NITRITE NEGATIVE 01/26/2017 1443   LEUKOCYTESUR NEGATIVE 01/26/2017 1443   Sepsis Labs: @LABRCNTIP (procalcitonin:4,lacticidven:4)  ) Recent Results (from the past 240 hour(s))  Culture, Urine     Status: None   Collection Time: 01/26/17  2:43 PM  Result Value Ref Range Status   Specimen Description URINE, CATHETERIZED  Final   Special Requests NONE  Final   Culture NO GROWTH  Final   Report Status 01/27/2017 FINAL  Final         Radiology Studies: Ct Chest W Contrast  Result Date: 01/28/2017 CLINICAL DATA:  71 year-old female with history of lung cancer about to start chemotherapy. Baseline CT examination. EXAM: CT CHEST, ABDOMEN, AND PELVIS WITH CONTRAST TECHNIQUE: Multidetector CT imaging of the chest, abdomen and pelvis was performed following the standard protocol during bolus administration of intravenous contrast. CONTRAST:  170mL ISOVUE-300 IOPAMIDOL (ISOVUE-300) INJECTION 61% COMPARISON:  Chest CT 11/02/2016.  PET-CT 11/23/2016. FINDINGS: CT CHEST FINDINGS Cardiovascular: Large filling defects in pulmonary artery branches throughout both lungs, including a large saddle embolus, as well as lobar, segmental and subsegmental  sized emboli. Some of these appear occlusive (right lower lobe) while most are nonocclusive. Right ventricle appears mildly dilated when compared with the (RV diameter of approximately 5.2 cm versus LV diameter approximately 4.3 cm) resulting in an elevated RV to LV ratio of 1.21, concerning for right heart strain. Pulmonic trunk is not dilated at this time. There is no significant pericardial fluid, thickening or pericardial calcification. Aortic atherosclerosis. No definite coronary artery calcifications. Mediastinum/Nodes: Extensive nodal tissue is again noted throughout the right hilar region, with individual lymph nodes measuring up to at least 1 cm in short axis. No other mediastinal or left hilar lymphadenopathy is noted. Esophagus is unremarkable in  appearance. No axillary lymphadenopathy. Lungs/Pleura: Previously noted mass in the superior segment of the right lower lobe currently measures 1.9 x 2.6 x 4.0 cm (axial image 64 of series 4 and coronal image 114 of series 6). Compared a prior examinations there is again an abundant infrahilar soft tissue adjacent to the right lower lobe bronchus, with increasing narrowing of the bronchus on today's study best appreciated on axial image 28 of series 3), and increasing postobstructive changes in the basal segments of the right lower lobe common most of which appear impacted with secretions. More distally in the basal segments of the right lower lobe there is some peribronchovascular micronodularity, likely reflective of postobstructive changes. No other definite suspicious appearing pulmonary nodules or masses are noted in the left lung. No pleural effusions. Musculoskeletal: Multiple lytic lesions are noted throughout the visualized axial and appendicular skeleton, including a lesion in the posterior aspect of the T7 vertebral body, lesion in the posterior aspect of the T8 vertebral body on the left side which also involves the adjacent medial aspect of the left eighth rib which is expansile, lytic and fractured in multiple places (pathologic fracture) with extension into the overlying soft tissues (best appreciated on axial image 32 of series 3), lesion in the posterior aspect of T12 on the right side extending into the transverse process, and large lytic lesion in the T12 spinous process. All of these osseous changes appear progressive compared to the prior study from 11/02/2016. Surgical clips in the left breast, presumably from prior lumpectomy. CT ABDOMEN PELVIS FINDINGS Hepatobiliary: When compared to prior examinations, the previously noted right adrenal mass has enlarged, and is now invasive into the medial aspect of the right lobe of the liver occupying predominantly segment 7, where the intrahepatic  portion of the mass measures up to 8.1 x 6.0 x 3.4 cm (axial image 49 of series 3 and coronal image 99 of series 6). No other hepatic lesions are confidently identified. No intra or extrahepatic biliary ductal dilatation. Gallbladder is normal in appearance. Pancreas: No pancreatic mass. No pancreatic ductal dilatation. No pancreatic or peripancreatic fluid or inflammatory changes. Spleen: Unremarkable. Adrenals/Urinary Tract: Bilateral adrenal masses are again noted, but significantly increased in size compared to the prior study, with the right adrenal mass now invading the right lobe of the liver (discussed above). The overall dimensions of the right adrenal mass (including the exclusively intrahepatic portion which was reported above) are approximately 6.2 x 6.0 x 8.1 cm (coronal image 100 of series 6 and axial image 49 of series 3). The dimensions of the left adrenal mass are currently 6.7 x 3.4 x 3.7 cm (axial image 53 of series 3 and coronal image 94 of series 6). Left kidney is normal in appearance. The previously described right adrenal mass is now causing distortion of upper pole the  right kidney such that early renal invasion is not entirely excluded (best appreciated on coronal image 105 of series 6). No hydroureteronephrosis. Urinary bladder is normal in appearance. Stomach/Bowel: The appearance of the stomach is normal. There is no pathologic dilatation of small bowel or colon. Numerous colonic diverticulae are noted, particularly in the sigmoid colon, without surrounding inflammatory changes to suggest an acute diverticulitis at this time. Normal appendix. Vascular/Lymphatic: Aortic atherosclerosis (mild), without evidence of aneurysm or dissection in the abdominal or pelvic vasculature. IVC filter noted, with tip terminating just below the level of the renal veins. Notably, there does not appear to be significant clot burden associated with the IVC filter at this time. Additionally, pelvic veins  appear widely patent. Extensive retroperitoneal lymphadenopathy noted, with the largest lymph nodes measuring up to 1.7 cm in the left para-aortic nodal station (axial image 67 of series 3). Multiple other borderline enlarged upper abdominal ligament lymph nodes are also noted, nonspecific but suspicious. In the subcutaneous fat immediately anterior to the lower anterior abdominal wall in the pelvic region (axial image 109 of series 3) there is a soft tissue attenuation lesion that measures 1.5 cm in short axis (axial image 109 of series 3), new compared to the prior study, likely to represent enlarged lymph node. Reproductive: Probable fibroid uterus with multiple lesions, the largest of which measures 5.4 cm in the fundus of the uterus and is partially calcified. Retroverted uterus. Ovaries are unremarkable in appearance. Other: No significant volume of ascites. No pneumoperitoneum. Musculoskeletal: There are no aggressive appearing lytic or blastic lesions noted in the visualized portions of the skeleton. IMPRESSION: 1. Massive saddle pulmonary embolism with additional clot burden extending throughout both lungs involving central, lobar, segmental and subsegmental sized vessels, with both occlusive and nonocclusive thrombus and evidence of right-sided heart strain (RV to LV ratio 1.21) consistent with at least submassive (intermediate risk) PE. The presence of right heart strain has been associated with an increased risk of morbidity and mortality. Please activate Code PE by paging [phone]. 2. Although the previously described mass in the superior segment of the right lower lobe appears relatively similar to prior examinations, there is clear progression of disease on today's study, with worsening postobstructive changes in the right lower lobe, worsening metastatic disease throughout the thoracic skeleton, enlarging bilateral adrenal metastasis (with direct invasion of the liver and potentially the superior  aspect of the right kidney from the right adrenal mass), and worsening pelvic and retroperitoneal lymphadenopathy, as detailed above. 3. IVC filter appears appropriately positioned. Notably, there is no significant burden of clot associated with the filter at this time, and no evidence of deep venous thrombosis in the pelvic veins. 4. Extensive colonic diverticulosis without evidence of acute diverticulitis at this time. 5. Aortic atherosclerosis (mild). 6. Additional incidental findings, as above. Critical Value/emergent results were called by telephone at the time of interpretation on 01/28/2017 at 1:01 pm to Dr. Alvy Bimler and Dr. Nehemiah Massed (for Dr. Lindi Adie), who verbally acknowledged these results. Aortic Atherosclerosis (ICD10-I70.0). Electronically Signed   By: Vinnie Langton M.D.   On: 01/28/2017 13:40   Ct Abdomen Pelvis W Contrast  Result Date: 01/28/2017 CLINICAL DATA:  71 year-old female with history of lung cancer about to start chemotherapy. Baseline CT examination. EXAM: CT CHEST, ABDOMEN, AND PELVIS WITH CONTRAST TECHNIQUE: Multidetector CT imaging of the chest, abdomen and pelvis was performed following the standard protocol during bolus administration of intravenous contrast. CONTRAST:  182mL ISOVUE-300 IOPAMIDOL (ISOVUE-300) INJECTION 61% COMPARISON:  Chest  CT 11/02/2016.  PET-CT 11/23/2016. FINDINGS: CT CHEST FINDINGS Cardiovascular: Large filling defects in pulmonary artery branches throughout both lungs, including a large saddle embolus, as well as lobar, segmental and subsegmental sized emboli. Some of these appear occlusive (right lower lobe) while most are nonocclusive. Right ventricle appears mildly dilated when compared with the (RV diameter of approximately 5.2 cm versus LV diameter approximately 4.3 cm) resulting in an elevated RV to LV ratio of 1.21, concerning for right heart strain. Pulmonic trunk is not dilated at this time. There is no significant pericardial fluid, thickening or  pericardial calcification. Aortic atherosclerosis. No definite coronary artery calcifications. Mediastinum/Nodes: Extensive nodal tissue is again noted throughout the right hilar region, with individual lymph nodes measuring up to at least 1 cm in short axis. No other mediastinal or left hilar lymphadenopathy is noted. Esophagus is unremarkable in appearance. No axillary lymphadenopathy. Lungs/Pleura: Previously noted mass in the superior segment of the right lower lobe currently measures 1.9 x 2.6 x 4.0 cm (axial image 64 of series 4 and coronal image 114 of series 6). Compared a prior examinations there is again an abundant infrahilar soft tissue adjacent to the right lower lobe bronchus, with increasing narrowing of the bronchus on today's study best appreciated on axial image 28 of series 3), and increasing postobstructive changes in the basal segments of the right lower lobe common most of which appear impacted with secretions. More distally in the basal segments of the right lower lobe there is some peribronchovascular micronodularity, likely reflective of postobstructive changes. No other definite suspicious appearing pulmonary nodules or masses are noted in the left lung. No pleural effusions. Musculoskeletal: Multiple lytic lesions are noted throughout the visualized axial and appendicular skeleton, including a lesion in the posterior aspect of the T7 vertebral body, lesion in the posterior aspect of the T8 vertebral body on the left side which also involves the adjacent medial aspect of the left eighth rib which is expansile, lytic and fractured in multiple places (pathologic fracture) with extension into the overlying soft tissues (best appreciated on axial image 32 of series 3), lesion in the posterior aspect of T12 on the right side extending into the transverse process, and large lytic lesion in the T12 spinous process. All of these osseous changes appear progressive compared to the prior study from  11/02/2016. Surgical clips in the left breast, presumably from prior lumpectomy. CT ABDOMEN PELVIS FINDINGS Hepatobiliary: When compared to prior examinations, the previously noted right adrenal mass has enlarged, and is now invasive into the medial aspect of the right lobe of the liver occupying predominantly segment 7, where the intrahepatic portion of the mass measures up to 8.1 x 6.0 x 3.4 cm (axial image 49 of series 3 and coronal image 99 of series 6). No other hepatic lesions are confidently identified. No intra or extrahepatic biliary ductal dilatation. Gallbladder is normal in appearance. Pancreas: No pancreatic mass. No pancreatic ductal dilatation. No pancreatic or peripancreatic fluid or inflammatory changes. Spleen: Unremarkable. Adrenals/Urinary Tract: Bilateral adrenal masses are again noted, but significantly increased in size compared to the prior study, with the right adrenal mass now invading the right lobe of the liver (discussed above). The overall dimensions of the right adrenal mass (including the exclusively intrahepatic portion which was reported above) are approximately 6.2 x 6.0 x 8.1 cm (coronal image 100 of series 6 and axial image 49 of series 3). The dimensions of the left adrenal mass are currently 6.7 x 3.4 x 3.7 cm (axial  image 53 of series 3 and coronal image 94 of series 6). Left kidney is normal in appearance. The previously described right adrenal mass is now causing distortion of upper pole the right kidney such that early renal invasion is not entirely excluded (best appreciated on coronal image 105 of series 6). No hydroureteronephrosis. Urinary bladder is normal in appearance. Stomach/Bowel: The appearance of the stomach is normal. There is no pathologic dilatation of small bowel or colon. Numerous colonic diverticulae are noted, particularly in the sigmoid colon, without surrounding inflammatory changes to suggest an acute diverticulitis at this time. Normal appendix.  Vascular/Lymphatic: Aortic atherosclerosis (mild), without evidence of aneurysm or dissection in the abdominal or pelvic vasculature. IVC filter noted, with tip terminating just below the level of the renal veins. Notably, there does not appear to be significant clot burden associated with the IVC filter at this time. Additionally, pelvic veins appear widely patent. Extensive retroperitoneal lymphadenopathy noted, with the largest lymph nodes measuring up to 1.7 cm in the left para-aortic nodal station (axial image 67 of series 3). Multiple other borderline enlarged upper abdominal ligament lymph nodes are also noted, nonspecific but suspicious. In the subcutaneous fat immediately anterior to the lower anterior abdominal wall in the pelvic region (axial image 109 of series 3) there is a soft tissue attenuation lesion that measures 1.5 cm in short axis (axial image 109 of series 3), new compared to the prior study, likely to represent enlarged lymph node. Reproductive: Probable fibroid uterus with multiple lesions, the largest of which measures 5.4 cm in the fundus of the uterus and is partially calcified. Retroverted uterus. Ovaries are unremarkable in appearance. Other: No significant volume of ascites. No pneumoperitoneum. Musculoskeletal: There are no aggressive appearing lytic or blastic lesions noted in the visualized portions of the skeleton. IMPRESSION: 1. Massive saddle pulmonary embolism with additional clot burden extending throughout both lungs involving central, lobar, segmental and subsegmental sized vessels, with both occlusive and nonocclusive thrombus and evidence of right-sided heart strain (RV to LV ratio 1.21) consistent with at least submassive (intermediate risk) PE. The presence of right heart strain has been associated with an increased risk of morbidity and mortality. Please activate Code PE by paging [phone]. 2. Although the previously described mass in the superior segment of the right  lower lobe appears relatively similar to prior examinations, there is clear progression of disease on today's study, with worsening postobstructive changes in the right lower lobe, worsening metastatic disease throughout the thoracic skeleton, enlarging bilateral adrenal metastasis (with direct invasion of the liver and potentially the superior aspect of the right kidney from the right adrenal mass), and worsening pelvic and retroperitoneal lymphadenopathy, as detailed above. 3. IVC filter appears appropriately positioned. Notably, there is no significant burden of clot associated with the filter at this time, and no evidence of deep venous thrombosis in the pelvic veins. 4. Extensive colonic diverticulosis without evidence of acute diverticulitis at this time. 5. Aortic atherosclerosis (mild). 6. Additional incidental findings, as above. Critical Value/emergent results were called by telephone at the time of interpretation on 01/28/2017 at 1:01 pm to Dr. Alvy Bimler and Dr. Nehemiah Massed (for Dr. Lindi Adie), who verbally acknowledged these results. Aortic Atherosclerosis (ICD10-I70.0). Electronically Signed   By: Vinnie Langton M.D.   On: 01/28/2017 13:40        Scheduled Meds: . dexamethasone  4 mg Oral Q12H  . escitalopram  20 mg Oral Daily  . levETIRAcetam  1,000 mg Oral BID  . pantoprazole  40 mg  Oral BID  . protein supplement shake  11 oz Oral Q24H   Continuous Infusions: . sodium chloride       LOS: 1 day    Time spent: 2min    Domenic Polite, MD [facility] Page via [email or web address], password [email or web address] After 7PM please contact night-coverage  01/29/2017, 10:55 AM

## 2017-01-29 NOTE — Progress Notes (Signed)
Pt is a DNR and requesting that no new IV status be reinstated, as previous IV was infiltrated.

## 2017-01-30 ENCOUNTER — Inpatient Hospital Stay (HOSPITAL_COMMUNITY): Payer: Medicare Other

## 2017-01-30 ENCOUNTER — Ambulatory Visit: Payer: Medicare Other | Admitting: Internal Medicine

## 2017-01-30 ENCOUNTER — Inpatient Hospital Stay (HOSPITAL_COMMUNITY): Payer: Medicare Other | Admitting: Physical Therapy

## 2017-01-30 ENCOUNTER — Other Ambulatory Visit: Payer: Medicare Other

## 2017-01-30 ENCOUNTER — Inpatient Hospital Stay (HOSPITAL_COMMUNITY): Payer: Medicare Other | Admitting: Occupational Therapy

## 2017-01-30 ENCOUNTER — Encounter (HOSPITAL_COMMUNITY): Payer: Medicare Other | Admitting: Psychology

## 2017-01-30 ENCOUNTER — Ambulatory Visit: Payer: Medicare Other | Admitting: Hematology and Oncology

## 2017-01-30 DIAGNOSIS — C7931 Secondary malignant neoplasm of brain: Secondary | ICD-10-CM

## 2017-01-30 DIAGNOSIS — C7970 Secondary malignant neoplasm of unspecified adrenal gland: Secondary | ICD-10-CM

## 2017-01-30 DIAGNOSIS — B009 Herpesviral infection, unspecified: Secondary | ICD-10-CM

## 2017-01-30 DIAGNOSIS — C772 Secondary and unspecified malignant neoplasm of intra-abdominal lymph nodes: Secondary | ICD-10-CM

## 2017-01-30 DIAGNOSIS — C7951 Secondary malignant neoplasm of bone: Secondary | ICD-10-CM

## 2017-01-30 NOTE — Progress Notes (Signed)
PROGRESS NOTE    Kelly Stuart  HBZ:[id number] DOB: April 14, 1946 DOA: 01/28/2017 PCP: Jonathon Jordan, MD  Brief Narrative:Kelly Stuart is a 71 y.o. female with medical history significant of Kelly Stuart a 71 y.o.female prior h/o breast CA in 2013 s/p chemofollowed by lumpectomy. In July 2018 was diagnosed with metastatic Lung Ca and in 11/2016 found to have brain mets was hospitalized for focal seizures and generalized weakness, started on Keppra and Decadron, then had radiation to the brain 9/10 onwards, after this was admitted with L sided weakness on 9/23, found to have 4.5cm R fronto-parietal hemorrhagic metastasis-decadron dose increased and keppra increased, then discahrged to Rehab 9/26. She was progressing slowly with PT and had a CT chest was ordered by Dr.Gudena for staging, which 10/6 afternoon noted massive Saddle pulmonary embolism. [facility] was called by covering Rehab MD to admit to hospital 10/6 afternoon, Palliative, Oncology consulted, case discussed with PCCM and Dr.Vaslow neuro-oncology   Assessment & Plan:   Acute massive saddle pulmonary embolism ([place]) 10/6 -she is critically ill with very grave prognosis , likely acute on chronic -due to relative lack of symptoms -s/p IVC filter 10/4 for DVT -placed while in Rehab by IR -unfortunately unable to anticoagulate due to 4.5cm hemorrhagic brain mets and not candidate for any TPA Rx -10/6, I called and discussed her case with Dr. Cecil Cobbs with Neuro oncology who follows the patient he recommended against anticoagulation,  Recommended palliative care discussions, I also called and discussed case with Dr. Roselie Awkward (pulmonary critical care on-call) 10/6, he also agreed that unfortunately with hemorrhagic metastasis and saddle PE and already having an IVC filter, she has a contraindication to anticoagulation and nothing else medically to offer her at this time -Seen by Oncology Dr. Alvy Bimler over weekend -Our  recommendations are for Comfort care, symptom management etc -Palliative med following -Patient and sister continue to have a very difficult time gripping with this diagnosis and this is a huge shock to them as they were expecting to start Chemo soon, they want to discuss further with Dr.Gudena, I will contact Dr.Gudena today -she is DNR, still not really symptomatic since she is bed bound and likely acute on chronic PE -I continue to anticipate hospital death, sooner or later she will become symptomatic     Metastatic lung cancer (metastasis from lung to other site), unspecified laterality [name] -Oncology consulted, poor prognosis, see above discussion    DVT  -s/p IVC filter 10/4    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 -not anticoagulation candidate due to this -continue Keppra and Decadron  DVT prophylaxis: SCDs Code Status: DNR  Family Communication: d/w sisters at bedside 10/6 and 10/7 Disposition Plan: anticipate hospital death  Consultants : Palliative,  -Oncology,  -d/w PCCM and Dr.Vaslow-Neuro/Onc    Procedures:   Antimicrobials:    Subjective: No complaints, remains anxious but otherwise without symptoms  Objective: Vitals:   01/29/17 1942 01/30/17 0001 01/30/17 0358 01/30/17 0748  BP: 100/67 (!) 98/59 99/62 99/79   Pulse: 89 72 75 90  Resp: (!) 21 18 20 18   Temp: (!) 97.5 F (36.4 C) 97.8 F (36.6 C) (!) 97.5 F (36.4 C) 98.7 F (37.1 C)  TempSrc: Oral Oral Oral Oral  SpO2: 96% 96% 92% 95%  Weight:      Height:        Intake/Output Summary (Last 24 hours) at 01/30/17 1056 Last data filed at 01/30/17 0600  Gross per 24 hour  Intake  240 ml  Output             2350 ml  Net            -2110 ml   Filed Weights   01/28/17 1547  Weight: 65.8 kg (145 lb)    Examination:  Gen: frail, anxious, tearful, alert, oriented, no respiratory distress HEENT: PERRLA, Neck supple, no JVD Lungs: diminished breath sounds at  the bases CVS: S1-S2 tachycardic Abd: soft, Non tender, non distended, BS present Extremities: No Cyanosis, Clubbing or edema Skin: no new rashes Neuro, left hemiparesis Psych: anxious and visibly upset    Data Reviewed:   CBC:  Recent Labs Lab 01/25/17 0652 01/27/17 0440  WBC 5.0 6.1  NEUTROABS 3.3 4.6  HGB 10.0* 10.0*  HCT 30.6* 31.2*  MCV 87.2 87.4  PLT 120* 400*   Basic Metabolic Panel:  Recent Labs Lab 01/25/17 0652 01/27/17 0440  NA 133* 133*  K 3.6 3.4*  CL 101 106  CO2 23 23  GLUCOSE 104* 111*  BUN 13 8  CREATININE 0.60 0.53  CALCIUM 8.4* 8.1*   GFR: Estimated Creatinine Clearance: 66 mL/min (by C-G formula based on SCr of 0.53 mg/dL). Liver Function Tests: No results for input(s): AST, ALT, ALKPHOS, BILITOT, PROT, ALBUMIN in the last 168 hours. No results for input(s): LIPASE, AMYLASE in the last 168 hours. No results for input(s): AMMONIA in the last 168 hours. Coagulation Profile: No results for input(s): INR, PROTIME in the last 168 hours. Cardiac Enzymes: No results for input(s): CKTOTAL, CKMB, CKMBINDEX, TROPONINI in the last 168 hours. BNP (last 3 results) No results for input(s): PROBNP in the last 8760 hours. HbA1C: No results for input(s): HGBA1C in the last 72 hours. CBG:  Recent Labs Lab 01/25/17 0641 01/26/17 0545 01/27/17 0706 01/28/17 0610  GLUCAP 110* 114* 103* 91   Lipid Profile: No results for input(s): CHOL, HDL, LDLCALC, TRIG, CHOLHDL, LDLDIRECT in the last 72 hours. Thyroid Function Tests: No results for input(s): TSH, T4TOTAL, FREET4, T3FREE, THYROIDAB in the last 72 hours. Anemia Panel: No results for input(s): VITAMINB12, FOLATE, FERRITIN, TIBC, IRON, RETICCTPCT in the last 72 hours. Urine analysis:    Component Value Date/Time   COLORURINE YELLOW 01/26/2017 [place] 01/26/2017 1443   LABSPEC 1.005 01/26/2017 1443   PHURINE 8.0 01/26/2017 1443   GLUCOSEU NEGATIVE 01/26/2017 1443   HGBUR  NEGATIVE 01/26/2017 1443   BILIRUBINUR NEGATIVE 01/26/2017 1443   BILIRUBINUR n 09/11/2014 0903   KETONESUR NEGATIVE 01/26/2017 1443   PROTEINUR NEGATIVE 01/26/2017 1443   UROBILINOGEN negative 09/11/2014 0903   NITRITE NEGATIVE 01/26/2017 1443   LEUKOCYTESUR NEGATIVE 01/26/2017 1443   Sepsis Labs: @LABRCNTIP (procalcitonin:4,lacticidven:4)  ) Recent Results (from the past 240 hour(s))  Culture, Urine     Status: None   Collection Time: 01/26/17  2:43 PM  Result Value Ref Range Status   Specimen Description URINE, CATHETERIZED  Final   Special Requests NONE  Final   Culture NO GROWTH  Final   Report Status 01/27/2017 FINAL  Final         Radiology Studies: No results found.      Scheduled Meds: . dexamethasone  4 mg Oral Q12H  . escitalopram  20 mg Oral Daily  . levETIRAcetam  1,000 mg Oral BID  . pantoprazole  40 mg Oral BID  . protein supplement shake  11 oz Oral Q24H   Continuous Infusions: . sodium chloride       LOS: 2  days    Time spent: 25min    Domenic Polite, MD [facility] Page via [email or web address], password [email or web address] After 7PM please contact night-coverage  01/30/2017, 10:56 AM

## 2017-01-30 NOTE — Progress Notes (Signed)
Hematology-Oncology  S: Lying in bed with Left paralysis Vitals:   01/30/17 0748 01/30/17 1422  BP: 99/79 98/63  Pulse: 90 89  Resp: 18   Temp: 98.7 F (37.1 C) 97.9 F (36.6 C)  SpO2: 95% 95%    Exam: Significant for left-sided hemiparalysis Lungs: Clear to auscultation Abdomen nondistended but slightly tender  Assessment and plan 1. Metastatic lung cancer with brain, bone, lung, retroperitoneal lymph nodes, adrenal metastases in the right adrenal met is eroding into her right liver 2. In addition to this there is massive saddle pulmonary embolism I discussed with the patient that there has been a dramatic progression of disease since the last time we scanned her. I spent 60 minutes going over her prognosis and multiple treatment options that we have previously discussed. Unfortunately given the rapidity of herpes of her disease and the extremely poor performance status related to this recent bleed in her brain in addition to the saddle pulmonary embolus which cannot be anticoagulated, I did not recommend continuation of aggressive therapy.  I recommend hospice care. I spent another 30 minutes discussing the same plan with Judson Roch who is her primary caregiver.

## 2017-01-30 NOTE — Progress Notes (Signed)
Patient has order for IVF but does not have IV access.  Patient refuses to have access and states she will not take IVF because she drinks a lot of water.  Patient states she will wait to talk with her regular MD. (oncology)Pt resting with call bell within reach.  Will continue to monitor. Payton Emerald, RN

## 2017-01-30 NOTE — Progress Notes (Signed)
PMT no charge note.   Patient seen briefly this am, resting in bed, no family at the bedside, in no distress, is resting in bed with eyes closed, does not respond when her name is called, work of  breathing does not appear to be labored at the moment  BP 99/79 (BP Location: Right Arm)   Pulse 90   Temp 98.7 F (37.1 C) (Oral)   Resp 18   Ht '5\' 10"'  (1.778 m)   Wt 65.8 kg (145 lb)   LMP 08/23/2008   SpO2 95%   BMI 20.81 kg/m   Labs and imaging noted  A/P: Extensive saddle PE History of breast ca Met lugn ca to brain bone bilateral adrenals and lymph node Mild dyspnea and anxiety Goals of care discussions.   PLAN: Continue current scope of treatment PRN medications used noted, has fentanyl IV PRN available.  PMT will continue to follow hospital course, provide an extra layer of support, assist with symptom management and appropriate disposition arrangements.   Loistine Chance MD [facility] health palliative medicine team [phone]

## 2017-01-30 NOTE — Progress Notes (Signed)
Patient family to desk requesting assistance for pt c/o foley discomfort after repositioning - this RN to bedside, repositioned foley bag and tubing - also applied foley leg anchor for patient comfort. Family at bedside - patient reports relief of discomfort and no other requests at this time. A&Ox4

## 2017-01-30 NOTE — Care Management Note (Signed)
Case Management Note Marvetta Gibbons RN, BSN Unit 4E-Case Manager [phone]  Patient Details  Name: Kelly Stuart MRN: [id number] Date of Birth: [birth year]  Subjective/Objective:   Pt admitted with Acute massive saddle pulmonary embolism ([place]) 10/6- hx Metastatic lung cancer                 Action/Plan: PTA pt was at IP rehab- however per MD note prognosis grim- PC consulted- anticipate hospital death vs residential hospice-  CM and CSW will follow.   Expected Discharge Date:                  Expected Discharge Plan:     In-House Referral:     Discharge planning Services  CM Consult  Post Acute Care Choice:    Choice offered to:     DME Arranged:    DME Agency:     HH Arranged:    HH Agency:     Status of Service:  In process, will continue to follow  If discussed at Long Length of Stay Meetings, dates discussed:    Discharge Disposition:   Additional Comments:  Dawayne Patricia, RN 01/30/2017, 2:00 PM

## 2017-01-30 NOTE — Progress Notes (Signed)
Initial Nutrition Assessment  DOCUMENTATION CODES:   Severe malnutrition in context of chronic illness  INTERVENTION:    Patient declines nutrition interventions at this time.  NUTRITION DIAGNOSIS:   Malnutrition (severe) related to chronic illness (cancer) as evidenced by severe depletion of muscle mass, severe depletion of body fat, percent weight loss (13% weight loss in 1 month).  GOAL:   Patient will meet greater than or equal to 90% of their needs  MONITOR:   PO intake, Weight trends  REASON FOR ASSESSMENT:   Malnutrition Screening Tool    ASSESSMENT:   71 yo female with PMH of breast CA 2013, metastatic lung cancer with brain mets, radiation to brain starting 9/10, seizures who was recently d/c to rehab unit on 9/23. Found to have a massive saddle pulmonary embolism on CT and was transferred to acute hospital on 10/6.  Patient reports that she has been eating very well and her weight loss has stopped. She declines all supplements, reporting that they cause her diarrhea. She also declines snacks. Family at bedside reports that patient has lost ~26 lbs. 13% weight loss within the past month is significant.  Per review of progress notes, patient has a very poor prognosis and hospital death is expected. Palliative Care team has been following and discussing goals of care with family.  Nutrition-Focused physical exam completed. Findings are mild/moderate and severe fat depletion, mild/moderate and severe muscle depletion, and no edema.   Labs and medications reviewed.  Diet Order:  Diet Heart Room service appropriate? Yes; Fluid consistency: Thin  Skin:  Reviewed, no issues  Last BM:  10/8  Height:   Ht Readings from Last 1 Encounters:  01/28/17 5\' 10"  (1.778 m)    Weight:   Wt Readings from Last 1 Encounters:  01/28/17 145 lb (65.8 kg)    Ideal Body Weight:  68.2 kg  BMI:  Body mass index is 20.81 kg/m.  Estimated Nutritional Needs:   Kcal:   1610-9604  Protein:  105-120 gm  Fluid:  2 L  EDUCATION NEEDS:   No education needs identified at this time  Molli Barrows, [place], [place], [place] Pager [phone] After Hours Pager [phone]

## 2017-01-31 ENCOUNTER — Ambulatory Visit: Payer: Medicare Other | Admitting: Physical Therapy

## 2017-01-31 DIAGNOSIS — I82409 Acute embolism and thrombosis of unspecified deep veins of unspecified lower extremity: Secondary | ICD-10-CM | POA: Insufficient documentation

## 2017-01-31 DIAGNOSIS — E43 Unspecified severe protein-calorie malnutrition: Secondary | ICD-10-CM | POA: Insufficient documentation

## 2017-01-31 LAB — GLUCOSE, CAPILLARY: Glucose-Capillary: 139 mg/dL — ABNORMAL HIGH (ref 65–99)

## 2017-01-31 MED ORDER — FENTANYL CITRATE (PF) 100 MCG/2ML IJ SOLN: 12.5000 ug | INTRAMUSCULAR | Status: DC | PRN

## 2017-01-31 MED ORDER — MORPHINE SULFATE (CONCENTRATE) 10 MG /0.5 ML PO SOLN: 5.0000 mg | ORAL | 0 refills | Status: AC | PRN

## 2017-01-31 MED ORDER — LORAZEPAM 1 MG PO TABS: 0.5000 mg | ORAL_TABLET | ORAL | 0 refills | Status: AC | PRN

## 2017-01-31 NOTE — Clinical Social Work Note (Signed)
Clinical Social Work Assessment  Patient Details  Name: Kelly Stuart MRN: [id number] Date of Birth: 1946/03/01  Date of referral:  01/31/17               Reason for consult:  Discharge Planning, End of Life/Hospice                Permission sought to share information with:  Family Supports Permission granted to share information::  Yes, Verbal Permission Granted  Name::     Luciana Axe  Agency::  [facility]  Relationship::  sister  Contact Information:  [phone]  Housing/Transportation Living arrangements for the past 2 months:  Single Family Home Source of Information:  Patient Patient Interpreter Needed:  None Criminal Activity/Legal Involvement Pertinent to Current Situation/Hospitalization:  No - Comment as needed Significant Relationships:  Adult Children, Siblings, Other Family Members Lives with:  Self Do you feel safe going back to the place where you live?  Yes Need for family participation in patient care:  Yes (Comment)  Care giving concerns:  No family at bedside   Social Worker assessment / plan:  CSW met patient at bedside to discuss disposition plan. Patient has met with palliative yesterday and had agreed to residential hospice. Patient choice [facility]. CSW stated that unfortunately [facility] does not have a bed available for patient. Patient stated she will talk to sister and see if she would want another facility. Patient seemed overwhelmed with wanting to make a decision. CSW stated she will give patient some time to talk to family before moving forward.  CSW was contacted by RN. RN stated that patient would only like [facility]. CSW contacted Harmon Pier from [facility] and made a referral on behalf of patient   Employment status:  Retired Forensic scientist:  Medicare PT Recommendations:  Not assessed at this time Information / Referral to community resources:   (residential hospice)  Patient/Family's Response to care: Patient is agreeable to  discharge to residential hospice. Patient has family support  Patient/Family's Understanding of and Emotional Response to Diagnosis, Current Treatment, and Prognosis: Patient has clear understanding of recent hospitalization  Emotional Assessment Appearance:  Appears stated age Attitude/Demeanor/Rapport:  Other (appropriate) Affect (typically observed):  Stable, Pleasant Orientation:  Oriented to Self, Oriented to Place, Oriented to  Time, Oriented to Situation Alcohol / Substance use:  Not Applicable Psych involvement (Current and /or in the community):  No (Comment)  Discharge Needs  Concerns to be addressed:  No discharge needs identified Readmission within the last 30 days:  No Current discharge risk:  Terminally ill Barriers to Discharge:  No Barriers Identified   Wende Neighbors, LCSW 01/31/2017, 11:52 AM

## 2017-01-31 NOTE — Progress Notes (Signed)
Palliative medicine progress note  Reason for visit: goals of care/symptom management  I met today with patient and 2 of her sisters.  She reports that she was able to meet with Amy from [facility] and they discussed her desire for transition to [facility] for end of life care.    She reports that she is currently not in pain, and she is not short of breath.  We discussed use of prn medications if need arises.   BP 103/72 (BP Location: Right Arm)   Pulse 89   Temp 98.1 F (36.7 C) (Oral)   Resp (!) 22   Ht '5\' 10"'  (1.778 m)   Wt 65.8 kg (145 lb)   LMP 08/23/2008   SpO2 95%   BMI 20.81 kg/m   Labs and imaging reviewed  General: Alert, awake, in no acute distress.  HEENT: No bruits, no goiter, no JVD Heart: Regular rate and rhythm.  Lungs: Diminished air movement Abdomen: Soft, nontender, nondistended  Skin: Warm and dry Neuro: Grossly intact, nonfocal.  A/P: Extensive saddle PE History of breast ca Met lung ca to brain bone bilateral adrenals and lymph node Mild dyspnea and anxiety Goals of care discussions.   PLAN: - Patient seen by Dr. Lindi Adie with recommendation for hospice for end of life care.  Family has been in discussion and are hopeful for [facility] for EOL care.   - She has a known massive saddle PE with no plan for anticoagulation (has hemorrhagic mets).  While she reports currently feeling well, I believe it is only a matter time (likely < 2 weeks) before she becomes symptomatic and ultimately dies from this. - PRN medications used noted, has fentanyl IV PRN available.  While she has not used PRN fentanyl, I did increase frequency of PRN fentanyl in case she needs to begin to utilize it.   - PMT will continue to follow to provide an extra layer of support and assist with symptom management.  Total time: 20 minutes Greater than 50%  of this time was spent counseling and coordinating care related to the above assessment and plan.   Micheline Rough, MD [place] Team [phone]

## 2017-01-31 NOTE — Discharge Summary (Signed)
Physician Discharge Summary  Patient ID: Kelly Stuart MRN: [id number] DOB/AGE: 1945/05/18 71 y.o.  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 Former smoker. EXAM: CHEST  2 VIEW COMPARISON:  Chest x-ray of July 2nd 2018 FINDINGS: The right hemidiaphragm is elevated as compared to the left. There is persistent abnormal density in the right perihilar region. On the left there is no focal infiltrate or evidence of a parenchymal mass. The heart is top-normal in size. The pulmonary vascularity is normal. The mediastinum is normal in width. IMPRESSION: New elevation of the right hemidiaphragm. Stable parenchymal density in the right mid lung consistent with known metastatic disease. No CHF nor pneumonia. Electronically Signed   By: David  Martinique M.D.   On: 01/26/2017 13:30   Ct Head Wo Contrast  Result Date: 01/15/2017 CLINICAL DATA:  Metastatic  cancer.  Increased left-sided weakness EXAM: CT HEAD WITHOUT CONTRAST TECHNIQUE: Contiguous axial images were obtained from the base of the skull through the vertex without intravenous contrast. COMPARISON:  CT head 12/30/2016, MRI 12/20/2016 and 12/31/2016. FINDINGS: Brain: Hemorrhagic metastatic deposit in the right frontal parietal lobe has increased in size. There has been further hemorrhage since the prior studies. The lesion  now measures 4.5 x 4.3 cm with surrounding edema. Local mass-effect without shift of the midline structures. Enhancing metastatic deposit in the left parietal lobe is not identified on CT without contrast but is seen on the prior MRI. No acute infarct. No other areas of hemorrhage. Negative for hydrocephalus or shift of the midline structures. Vascular: Negative for hyperdense vessel Skull: Negative Sinuses/Orbits: Negative Other: None IMPRESSION: 4.3 x 4.5 cm hemorrhagic metastatic deposit in the right frontal parietal lobe has increased in size since the prior studies. There has been further hemorrhage in the lesion. No shift of the midline structures. Additional metastatic deposit left parietal lobe not identified on CT. These results were called by telephone at the time of interpretation on 01/15/2017 at 2:21 pm to Dr. Davonna Belling , who verbally acknowledged these results. Electronically Signed   By: Franchot Gallo M.D.   On: 01/15/2017 14:18   Ct Chest W Contrast  Result Date: 01/28/2017 CLINICAL DATA:  71 year-old female with history of lung cancer about to start chemotherapy. Baseline CT examination. EXAM: CT CHEST, ABDOMEN, AND PELVIS WITH CONTRAST TECHNIQUE: Multidetector CT imaging of the chest, abdomen and pelvis was performed following the standard protocol during bolus administration of intravenous contrast. CONTRAST:  174mL ISOVUE-300 IOPAMIDOL (ISOVUE-300) INJECTION 61% COMPARISON:  Chest CT 11/02/2016.  PET-CT 11/23/2016. FINDINGS: CT CHEST FINDINGS Cardiovascular: Large filling defects in pulmonary artery branches throughout both lungs, including a large saddle embolus, as well as lobar, segmental and subsegmental sized emboli. Some of these appear occlusive (right lower lobe) while most are nonocclusive. Right ventricle appears mildly dilated when compared with the (RV diameter of approximately 5.2 cm versus LV diameter approximately 4.3 cm) resulting in  an elevated RV to LV ratio of 1.21,  concerning for right heart strain. Pulmonic trunk is not dilated at this time. There is no significant pericardial fluid, thickening or pericardial calcification. Aortic atherosclerosis. No definite coronary artery calcifications. Mediastinum/Nodes: Extensive nodal tissue is again noted throughout the right hilar region, with individual lymph nodes measuring up to at least 1 cm in short axis. No other mediastinal or left hilar lymphadenopathy is noted. Esophagus is unremarkable in appearance. No axillary lymphadenopathy. Lungs/Pleura: Previously noted mass in the superior segment of the right lower lobe currently measures 1.9 x 2.6 x 4.0 cm (axial image 64 of series 4 and coronal image 114 of series 6). Compared a prior examinations there is again an abundant infrahilar soft tissue adjacent to the right lower lobe bronchus, with increasing narrowing of the bronchus on today's study best appreciated on axial image 28 of series 3), and increasing postobstructive changes in the basal segments of the right lower lobe common most of which appear impacted with secretions. More distally in the basal segments of the right lower lobe there is some peribronchovascular micronodularity, likely reflective of postobstructive changes. No other definite suspicious appearing pulmonary nodules or masses are noted in the left lung. No pleural effusions. Musculoskeletal: Multiple lytic lesions are noted throughout the visualized axial and appendicular skeleton, including a lesion in the posterior aspect of the T7 vertebral body, lesion in the posterior aspect of the T8 vertebral body on the left side which also involves the adjacent medial aspect of the left eighth rib which is expansile, lytic and fractured in multiple places (pathologic fracture) with extension into the overlying soft tissues (best appreciated on axial image 32 of series 3), lesion in the posterior aspect of T12 on the right side extending into the transverse  process, and large lytic lesion in the T12 spinous process. All of these osseous changes appear progressive compared to the prior study from 11/02/2016. Surgical clips in the left breast, presumably from prior lumpectomy. CT ABDOMEN PELVIS FINDINGS Hepatobiliary: When compared to prior examinations, the previously noted right adrenal mass has enlarged, and is now invasive into the medial aspect of the right lobe of the liver occupying predominantly segment 7, where the intrahepatic portion of the mass measures up to 8.1 x 6.0 x 3.4 cm (axial image 49 of series 3 and coronal image 99 of series 6). No other hepatic lesions are confidently identified. No intra or extrahepatic biliary ductal dilatation. Gallbladder is normal in appearance. Pancreas: No pancreatic mass. No pancreatic ductal dilatation. No pancreatic or peripancreatic fluid or inflammatory changes. Spleen: Unremarkable. Adrenals/Urinary Tract: Bilateral adrenal masses are again noted, but significantly increased in size compared to the prior study, with the right adrenal mass now invading the right lobe of the liver (discussed above). The overall dimensions of the right adrenal mass (including the exclusively intrahepatic portion which was reported above) are approximately 6.2 x 6.0 x 8.1 cm (coronal image 100 of series 6 and axial image 49 of series 3). The dimensions of the left adrenal mass are currently 6.7 x 3.4 x 3.7 cm (axial image 53 of series 3 and coronal image 94 of series 6). Left kidney is normal in appearance. The previously described right adrenal mass is now causing distortion of upper pole the right kidney such that early renal invasion is not entirely excluded (best appreciated on coronal image 105 of series 6). No hydroureteronephrosis. Urinary bladder is normal in appearance. Stomach/Bowel: The appearance of the stomach is normal. There  is no pathologic dilatation of small bowel or colon. Numerous colonic diverticulae are noted,  particularly in the sigmoid colon, without surrounding inflammatory changes to suggest an acute diverticulitis at this time. Normal appendix. Vascular/Lymphatic: Aortic atherosclerosis (mild), without evidence of aneurysm or dissection in the abdominal or pelvic vasculature. IVC filter noted, with tip terminating just below the level of the renal veins. Notably, there does not appear to be significant clot burden associated with the IVC filter at this time. Additionally, pelvic veins appear widely patent. Extensive retroperitoneal lymphadenopathy noted, with the largest lymph nodes measuring up to 1.7 cm in the left para-aortic nodal station (axial image 67 of series 3). Multiple other borderline enlarged upper abdominal ligament lymph nodes are also noted, nonspecific but suspicious. In the subcutaneous fat immediately anterior to the lower anterior abdominal wall in the pelvic region (axial image 109 of series 3) there is a soft tissue attenuation lesion that measures 1.5 cm in short axis (axial image 109 of series 3), new compared to the prior study, likely to represent enlarged lymph node. Reproductive: Probable fibroid uterus with multiple lesions, the largest of which measures 5.4 cm in the fundus of the uterus and is partially calcified. Retroverted uterus. Ovaries are unremarkable in appearance. Other: No significant volume of ascites. No pneumoperitoneum. Musculoskeletal: There are no aggressive appearing lytic or blastic lesions noted in the visualized portions of the skeleton. IMPRESSION: 1. Massive saddle pulmonary embolism with additional clot burden extending throughout both lungs involving central, lobar, segmental and subsegmental sized vessels, with both occlusive and nonocclusive thrombus and evidence of right-sided heart strain (RV to LV ratio 1.21) consistent with at least submassive (intermediate risk) PE. The presence of right heart strain has been associated with an increased risk of morbidity  and mortality. Please activate Code PE by paging [phone]. 2. Although the previously described mass in the superior segment of the right lower lobe appears relatively similar to prior examinations, there is clear progression of disease on today's study, with worsening postobstructive changes in the right lower lobe, worsening metastatic disease throughout the thoracic skeleton, enlarging bilateral adrenal metastasis (with direct invasion of the liver and potentially the superior aspect of the right kidney from the right adrenal mass), and worsening pelvic and retroperitoneal lymphadenopathy, as detailed above. 3. IVC filter appears appropriately positioned. Notably, there is no significant burden of clot associated with the filter at this time, and no evidence of deep venous thrombosis in the pelvic veins. 4. Extensive colonic diverticulosis without evidence of acute diverticulitis at this time. 5. Aortic atherosclerosis (mild). 6. Additional incidental findings, as above. Critical Value/emergent results were called by telephone at the time of interpretation on 01/28/2017 at 1:01 pm to Dr. Alvy Bimler and Dr. Nehemiah Massed (for Dr. Lindi Adie), who verbally acknowledged these results. Aortic Atherosclerosis (ICD10-I70.0). Electronically Signed   By: Vinnie Langton M.D.   On: 01/28/2017 13:40    Ir Ivc Filter Plmt / S&i /img Guid/mod Sed  Result Date: 01/27/2017 INDICATION: Acute femoral popliteal DVT, patient cannot be anticoagulated, hemorrhagic intracranial metastatic disease EXAM: ULTRASOUND GUIDANCE FOR VASCULAR ACCESS IVC CATHETERIZATION AND VENOGRAM IVC FILTER INSERTION Date:  10/4/201810/07/2016 5:22 pm Radiologist:  M. Daryll Brod, MD Guidance:  Ultrasound and fluoroscopic CONTRAST:  65 cc Isovue-300 MEDICATIONS: 1% lidocaine local ANESTHESIA/SEDATION: Moderate Sedation Time: None. The patient was continuously monitored during the procedure by the interventional radiology nurse under my direct supervision.  FLUOROSCOPY TIME:  Fluoroscopy Time: 3 minutes 24 seconds (25 mGy). COMPLICATIONS: None immediate. PROCEDURE: Informed consent  was obtained from the patient following explanation of the procedure, risks, benefits and alternatives. The patient understands, agrees and consents for the procedure. All questions were addressed. A time out was performed. Maximal barrier sterile technique utilized including caps, mask, sterile gowns, sterile gloves, large sterile drape, hand hygiene, and betadine prep. Under sterile condition and local anesthesia, right internal jugular venous access was performed with ultrasound. Over a guide wire, the IVC filter delivery sheath and inner dilator were advanced into the IVC just above the IVC bifurcation. Contrast injection was performed for an IVC venogram. IVC VENOGRAM: The IVC is patent. No evidence of thrombus, stenosis, or occlusion. No variant venous anatomy. The renal veins are identified at L1. IVC FILTER INSERTION: Through the delivery sheath, the bard Denali IVC filter was deployed in the infrarenal IVC at the L2-3 level just below the renal veins and above the IVC bifurcation. Contrast injection confirmed position. There is good apposition of the filter against the IVC. The delivery sheath was removed and hemostasis was obtained with compression for 5 minutes. The patient tolerated the procedure well. No immediate complications. IMPRESSION: Ultrasound and fluoroscopically guided infrarenal IVC filter insertion. PLAN: Due to patient related comorbidities and/or clinical necessity, this IVC filter should be considered a permanent device. This patient will not be actively followed for future filter retrieval. Electronically Signed   By: Jerilynn Mages.  Shick M.D.   On: 01/27/2017 07:46   Dg Shoulder Left  Result Date: 01/25/2017 CLINICAL DATA:  History of left shoulder subluxation. 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 EXAM: LEFT SHOULDER - 2+  VIEW COMPARISON:  Chest x-ray dated October 24, 2016 which included portions of the left shoulder. FINDINGS: The bones are subjectively adequately mineralized. The glenohumeral joint space is well maintained. The subacromial subdeltoid space is normal. As best as can be determined the acromioclavicular joint is also normal. There is no evidence of acute or old fracture. IMPRESSION: There is no acute or significant chronic bony abnormality of the left shoulder. Electronically Signed   By: David  Martinique M.D.   On: 01/25/2017 16:20    Labs:  Basic Metabolic Panel:  Recent Labs Lab 01/25/17 0652 01/27/17 0440  NA 133* 133*  K 3.6 3.4*  CL 101 106  CO2 23 23  GLUCOSE 104* 111*  BUN 13 8  CREATININE 0.60 0.53  CALCIUM 8.4* 8.1*    CBC:  Recent Labs Lab 01/25/17 0652 01/27/17 0440  WBC 5.0 6.1  NEUTROABS 3.3 4.6  HGB 10.0* 10.0*  HCT 30.6* 31.2*  MCV 87.2 87.4  PLT 120* 124*    CBG:  Recent Labs Lab 01/25/17 0641 01/26/17 0545 01/27/17 0706 01/28/17 0610 01/31/17 0618  GLUCAP 110* 114* 103* 91 139*    Brief HPI:   Kelly R Lee71 y.o.femalewith history of breast cancer s/p lumpectomy 02/2012 with relapse 10/2016 and work up revealing metastasis to RUL, B-adrenals, ribs, thoracic spine and soft tissue left lateral chest, shoulder and left thigh and hemorrhagic deposit right frontoparietal lobe and left medial parietal lobe. She underwent stereotactic radiosurgery right frontal lobe by Dr. Rita Ohara and has completed XRT to brain. She was diagnosed with focal motor seizures affecting LUE/LLE and was started on Keppra as well as steroids on 9/9. She started developing progressive left sided weakness on 9/21 that progressed to diffuse weakness and left inattention and did not improve with decrease in [place]. She was admitted to [facility] on 9/23 for work up  which revealed increase in size of hemorrhagic deposit right frontal lobe with further hemorrhage in lesion. She was started on IV  decadron 4 mg bid with improvement. Dr. Mickeal Skinner evaluated patient and recommended continuing steroids bid as edema and hemorrhagic conversion felt to be likely be due to NSAIDs and recent radiotherapy. No further work up needed and plans for systemic therapy in the future. Therapy recommended by MD and rehab team due to significant deficits in mobility and ability to carry out ADL tasks.    Hospital Course: Kelly Stuart was admitted to rehab 01/18/2017 for inpatient therapies to consist of PT, ST and OT at least three hours five days a week. Past admission physiatrist, therapy team and rehab RN have worked together to provide customized collaborative inpatient rehab. Protein supplements were added to help with protein calorie malnutrition and family has been supplementing with food from home. Follow up Lytes showed renal status to be WNL and steroids were decreased to daily on 9/27. Bowel program was augmented to help manage constipation.  BLE Dopplers done on 9/28 revealed small focal segment of intramuscular thrombus in left gastrocnemius vein of left calf.  LLE was not edematous and no pain/symptoms reported therefore repeat dopplers ordered in a week for monitoring. Norvasc was discontinued on 10/2 due to issues with low blood pressures and she was treated with IVF X 24 hours.  Dr. Sima Matas (neuropsychologist) has followed for evaluation and felt that patient was dealing well with adjustment issues and she reported good support system.    Follow up CBC showed H/H as well as thrombocytopenia but no signs of bleeding noted. Repeat dopplers done 10/4 showed extensive DVT thorough "left femoral, popliteal, posterior tibial, and peroneal veins. Intramuscular thrombosis noted in the left gastroc and soleal veins". Dr. Lindi Adie was consulted for input as patient felt too  high risk for anticoagulation. He agreed with IVC filter for treatment and this was placed by radiology on 10/4 pm. Therapies were resumed on  10/5 am and patient reported to be feeling good. Dr. Lindi Adie followed up for input on chemo as patient was getting stronger. CT chest/abdomen/pelvis was ordered for staging and showed submassive saddle PE. She was not an anticoagulation candidate due to hemorrhagic brain mets and comfort measures with Palliative Care was recommended by heme/Onc. Patient was discharged to acute floor on 10/6 for bed rest and  for closer monitoring due to her guarded condition.     Rehab course: During patient's stay in rehab weekly team conferences were held to monitor patient's progress, set goals and discuss barriers to discharge. At admission, patient required max assist with ADL tasks and max +2 assist for mobility. She was making progress during her rehab stay but was limited by left inattention as well as dense left hemiparesis. She had shown improvement in activity tolerance, balance, postural control, as well as ability to compensate for deficits. She was able to perform ADL tasks with min assist for bathing and dressing. She was able to perform squat pivot transfer with mod Assist + 2,     Disposition: acute hospital  Diet: Regular diet.    Discharge Medication List as of 01/28/2017  3:04 PM    CONTINUE these medications which have NOT CHANGED   Details  acetaminophen (TYLENOL) 325 MG tablet Take 2 tablets (650 mg total) by mouth every 6 (six) hours as needed for mild pain (or Fever >/= 101)., Starting Wed 01/18/2017, Normal    dexamethasone (DECADRON) 4 MG tablet Take 1  tablet (4 mg total) by mouth every 12 (twelve) hours., Starting Wed 01/18/2017, Normal    escitalopram (LEXAPRO) 20 MG tablet Take 20 mg by mouth daily., Historical Med    levETIRAcetam (KEPPRA) 1000 MG tablet Take 1 tablet (1,000 mg total) by mouth 2 (two) times daily., Starting Wed 01/18/2017, Normal    pantoprazole (PROTONIX) 40 MG tablet Take 1 tablet (40 mg total) by mouth 2 (two) times daily., Starting Wed 01/18/2017, Normal     protein supplement shake (PREMIER PROTEIN) LIQD Take 325 mLs (11 oz total) by mouth daily., Starting Wed 01/18/2017, Normal    amLODipine (NORVASC) 5 MG tablet Take 5 mg by mouth daily., Starting Fri 11/04/2016, Historical Med    ibuprofen (ADVIL,MOTRIN) 200 MG tablet Take 200 mg by mouth 2 (two) times daily as needed for headache or moderate pain., Historical Med       Follow-up Information    Kirsteins, Luanna Salk, MD Follow up.   Specialty:  Physical Medicine and Rehabilitation Contact information: [place] [address] [phone]        Nicholas Lose, MD Follow up.   Specialty:  Hematology and Oncology Contact information: [place] [address] [phone]        Jovita Gamma, MD Follow up.   Specialty:  Neurosurgery Contact information: [address] [place] [address] [phone]        Eppie Gibson, MD Follow up.   Specialty:  Radiation Oncology Contact information: [address] [address] [phone]           Signed: Bary Leriche 01/31/2017, 9:31 AM

## 2017-01-31 NOTE — Consult Note (Signed)
   [facility] CM Inpatient Consult   01/31/2017  Kelly Stuart [birth year] [id number]   Chart reviewed for re-admission in the Medicare ACO.  Review of the chart reveals the patient is not for Hospice Care/Comfort Care. Spoke with inpatient RNCM.  No [facility] Care Management needs noted at this time.  For questions, please contact:  Natividad Brood, RN BSN [facility] Liaison  [phone] business mobile phone Toll free office [phone]

## 2017-01-31 NOTE — Progress Notes (Signed)
   01/31/17 1157  Clinical Encounter Type  Visited With Patient and family together  Visit Type Follow-up  Referral From Nurse  Consult/Referral To Chaplain  Spiritual Encounters  Spiritual Needs Emotional;Prayer  Stress Factors  Family Stress Factors Health changes   Following up on [place].  Actually met this patient at [facility].  Amazing person.  Patient and family are living into the new diagnosis and what is next.  Patient expresses being at peace with where she is and is supported by her sisters.  Offered emotional support and a listening compassionate ear.  Prayer was extended.  Will follow as needed Chaplain Katherene Ponto

## 2017-01-31 NOTE — Discharge Summary (Signed)
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 In July 2018 was diagnosed with metastatic Lung Ca and in 11/2016 found to have brain mets was hospitalized for focal seizures and generalized weakness, started on Keppra and Decadron, then had radiation to the brain 9/10 onwards, after this was admitted with L sided weakness on 9/23, found to have 4.5cm R fronto-parietal hemorrhagic metastasis-decadron dose increased and keppra increased, then discahrged to Rehab 9/26. She was progressing slowly with PT and had a CT chest was ordered by Dr.Gudena for staging, which 10/6 afternoon noted massive Saddle pulmonary embolism. TRH was called by covering Rehab MD to admit to hospital 10/6 afternoon  Hospital Course:  Acute massive saddle pulmonary embolism ([place]) 10/6 -she is critically ill with very grave prognosis ,  likely acute on chronic -due to relative lack of symptoms and stability of vital signs. -s/p IVC filter 10/4 for DVT -placed while in Rehab by IR -unfortunately unable to anticoagulate due to 4.5cm hemorrhagic brain mets and not candidate for any TPA Rx -10/6, I called and discussed her case with Dr. Cecil Cobbs with Neuro oncology who follows the patient he recommended against anticoagulation, Recommended palliative care discussions, I also called and discussed case with Dr. Roselie Awkward (pulmonary critical care on-call) 10/6, he also agreed that unfortunately with hemorrhagic metastasis and saddle PE and already having an IVC filter, she has a contraindication to anticoagulation and nothing else medically to offer her at this time -Seen by Oncology Dr. Alvy Bimler over weekend and Dr.Gudena yesterday -Patient and sister had a very difficult time gripping with this diagnosis and this is a huge shock to them as they were expecting to start Chemo soon, now they understand the situation and agreeable to comfort focused care. 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 Qty: 15 mL, Refills: 0      CONTINUE these medications which have CHANGED   Details  LORazepam (ATIVAN) 1 MG tablet Take 0.5 tablets (0.5 mg total) by mouth every 4 (four) hours as needed for anxiety. Qty: 30 tablet, Refills: 0      CONTINUE these medications which have NOT CHANGED   Details  acetaminophen (TYLENOL) 325 MG tablet Take 2 tablets (650 mg total) by mouth every 6 (six) hours as needed for mild pain (or Fever >/= 101). Qty: 30 tablet, Refills: 0    dexamethasone (DECADRON) 4 MG tablet Take 1 tablet (4 mg total) by mouth every 12 (twelve) hours. Qty: 60 tablet, Refills: 0    escitalopram (LEXAPRO) 20 MG tablet Take 20 mg by mouth daily.    levETIRAcetam (KEPPRA) 1000 MG tablet Take 1 tablet (1,000 mg total) by mouth 2 (two) times daily. Qty: 60 tablet, Refills: 0    pantoprazole (PROTONIX) 40 MG tablet Take 1 tablet (40 mg total) by mouth 2 (two) times daily. Qty: 60 tablet, Refills: 0      STOP taking these medications     protein supplement shake (PREMIER PROTEIN) LIQD        Allergies  Allergen Reactions  . Codeine Nausea And Vomiting  . Penicillins Rash    Has patient had a PCN reaction causing immediate rash, facial/tongue/throat swelling, SOB or lightheadedness with hypotension: Yes Has patient had a PCN reaction causing severe rash involving mucus membranes or skin necrosis: No Has patient had a PCN reaction that required hospitalization: Yes Has patient had a PCN reaction occurring within the last 10 years: No If all of the above answers are "NO", then may proceed with Cephalosporin use.   . Sulfa Antibiotics Rash  . Tetracyclines & Related Rash    Patient states Acromycin      The results of significant diagnostics from this hospitalization (including imaging, microbiology,  ancillary and laboratory) are listed below for reference.    Significant Diagnostic Studies: Dg Chest 2 View  Result Date: 01/26/2017 CLINICAL DATA:  Lethargy today. 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 Former smoker. EXAM: CHEST  2 VIEW COMPARISON:  Chest x-ray of July 2nd 2018 FINDINGS: The right hemidiaphragm is elevated as compared to the left. There is persistent abnormal density in the right perihilar region. On the left there is no focal infiltrate or evidence of a parenchymal mass. The heart is top-normal in size. The pulmonary vascularity is normal. The mediastinum is normal in width. IMPRESSION: New elevation of the right hemidiaphragm. Stable parenchymal density in the right mid lung consistent with known metastatic disease. No CHF nor pneumonia. Electronically Signed   By: David  Martinique M.D.   On: 01/26/2017 13:30   Ct Head Wo Contrast  Result Date: 01/15/2017 CLINICAL DATA:  Metastatic  cancer.  Increased left-sided weakness EXAM: CT HEAD WITHOUT CONTRAST TECHNIQUE: Contiguous axial images were obtained from the base of the skull through the vertex without intravenous contrast. COMPARISON:  CT head 12/30/2016, MRI 12/20/2016 and 12/31/2016. FINDINGS: Brain: Hemorrhagic metastatic deposit in the right frontal parietal lobe has increased in size. There has been further hemorrhage since the prior  studies. The lesion now measures 4.5 x 4.3 cm with surrounding edema. Local mass-effect without shift of the midline structures. Enhancing metastatic deposit in the left parietal lobe is not identified on CT without contrast but is seen on the prior MRI. No acute infarct. No other areas of hemorrhage. Negative for hydrocephalus or shift of the midline structures. Vascular: Negative for hyperdense vessel Skull: Negative Sinuses/Orbits: Negative Other: None IMPRESSION: 4.3 x 4.5 cm hemorrhagic metastatic deposit in the right frontal parietal lobe has increased in size  since the prior studies. There has been further hemorrhage in the lesion. No shift of the midline structures. Additional metastatic deposit left parietal lobe not identified on CT. These results were called by telephone at the time of interpretation on 01/15/2017 at 2:21 pm to Dr. Davonna Belling , who verbally acknowledged these results. Electronically Signed   By: Franchot Gallo M.D.   On: 01/15/2017 14:18   Ct Chest W Contrast  Result Date: 01/28/2017 CLINICAL DATA:  71 year-old female with history of lung cancer about to start chemotherapy. Baseline CT examination. EXAM: CT CHEST, ABDOMEN, AND PELVIS WITH CONTRAST TECHNIQUE: Multidetector CT imaging of the chest, abdomen and pelvis was performed following the standard protocol during bolus administration of intravenous contrast. CONTRAST:  171mL ISOVUE-300 IOPAMIDOL (ISOVUE-300) INJECTION 61% COMPARISON:  Chest CT 11/02/2016.  PET-CT 11/23/2016. FINDINGS: CT CHEST FINDINGS Cardiovascular: Large filling defects in pulmonary artery branches throughout both lungs, including a large saddle embolus, as well as lobar, segmental and subsegmental sized emboli. Some of these appear occlusive (right lower lobe) while most are nonocclusive. Right ventricle appears mildly dilated when compared with the (RV diameter of approximately 5.2 cm versus LV diameter approximately 4.3 cm) resulting in an elevated RV to LV ratio of 1.21, concerning for right heart strain. Pulmonic trunk is not dilated at this time. There is no significant pericardial fluid, thickening or pericardial calcification. Aortic atherosclerosis. No definite coronary artery calcifications. Mediastinum/Nodes: Extensive nodal tissue is again noted throughout the right hilar region, with individual lymph nodes measuring up to at least 1 cm in short axis. No other mediastinal or left hilar lymphadenopathy is noted. Esophagus is unremarkable in appearance. No axillary lymphadenopathy. Lungs/Pleura: Previously  noted mass in the superior segment of the right lower lobe currently measures 1.9 x 2.6 x 4.0 cm (axial image 64 of series 4 and coronal image 114 of series 6). Compared a prior examinations there is again an abundant infrahilar soft tissue adjacent to the right lower lobe bronchus, with increasing narrowing of the bronchus on today's study best appreciated on axial image 28 of series 3), and increasing postobstructive changes in the basal segments of the right lower lobe common most of which appear impacted with secretions. More distally in the basal segments of the right lower lobe there is some peribronchovascular micronodularity, likely reflective of postobstructive changes. No other definite suspicious appearing pulmonary nodules or masses are noted in the left lung. No pleural effusions. Musculoskeletal: Multiple lytic lesions are noted throughout the visualized axial and appendicular skeleton, including a lesion in the posterior aspect of the T7 vertebral body, lesion in the posterior aspect of the T8 vertebral body on the left side which also involves the adjacent medial aspect of the left eighth rib which is expansile, lytic and fractured in multiple places (pathologic fracture) with extension into the overlying soft tissues (best appreciated on axial image 32 of series 3), lesion in the posterior aspect of T12 on the right side extending into the  transverse process, and large lytic lesion in the T12 spinous process. All of these osseous changes appear progressive compared to the prior study from 11/02/2016. Surgical clips in the left breast, presumably from prior lumpectomy. CT ABDOMEN PELVIS FINDINGS Hepatobiliary: When compared to prior examinations, the previously noted right adrenal mass has enlarged, and is now invasive into the medial aspect of the right lobe of the liver occupying predominantly segment 7, where the intrahepatic portion of the mass measures up to 8.1 x 6.0 x 3.4 cm (axial image 49 of  series 3 and coronal image 99 of series 6). No other hepatic lesions are confidently identified. No intra or extrahepatic biliary ductal dilatation. Gallbladder is normal in appearance. Pancreas: No pancreatic mass. No pancreatic ductal dilatation. No pancreatic or peripancreatic fluid or inflammatory changes. Spleen: Unremarkable. Adrenals/Urinary Tract: Bilateral adrenal masses are again noted, but significantly increased in size compared to the prior study, with the right adrenal mass now invading the right lobe of the liver (discussed above). The overall dimensions of the right adrenal mass (including the exclusively intrahepatic portion which was reported above) are approximately 6.2 x 6.0 x 8.1 cm (coronal image 100 of series 6 and axial image 49 of series 3). The dimensions of the left adrenal mass are currently 6.7 x 3.4 x 3.7 cm (axial image 53 of series 3 and coronal image 94 of series 6). Left kidney is normal in appearance. The previously described right adrenal mass is now causing distortion of upper pole the right kidney such that early renal invasion is not entirely excluded (best appreciated on coronal image 105 of series 6). No hydroureteronephrosis. Urinary bladder is normal in appearance. Stomach/Bowel: The appearance of the stomach is normal. There is no pathologic dilatation of small bowel or colon. Numerous colonic diverticulae are noted, particularly in the sigmoid colon, without surrounding inflammatory changes to suggest an acute diverticulitis at this time. Normal appendix. Vascular/Lymphatic: Aortic atherosclerosis (mild), without evidence of aneurysm or dissection in the abdominal or pelvic vasculature. IVC filter noted, with tip terminating just below the level of the renal veins. Notably, there does not appear to be significant clot burden associated with the IVC filter at this time. Additionally, pelvic veins appear widely patent. Extensive retroperitoneal lymphadenopathy noted, with  the largest lymph nodes measuring up to 1.7 cm in the left para-aortic nodal station (axial image 67 of series 3). Multiple other borderline enlarged upper abdominal ligament lymph nodes are also noted, nonspecific but suspicious. In the subcutaneous fat immediately anterior to the lower anterior abdominal wall in the pelvic region (axial image 109 of series 3) there is a soft tissue attenuation lesion that measures 1.5 cm in short axis (axial image 109 of series 3), new compared to the prior study, likely to represent enlarged lymph node. Reproductive: Probable fibroid uterus with multiple lesions, the largest of which measures 5.4 cm in the fundus of the uterus and is partially calcified. Retroverted uterus. Ovaries are unremarkable in appearance. Other: No significant volume of ascites. No pneumoperitoneum. Musculoskeletal: There are no aggressive appearing lytic or blastic lesions noted in the visualized portions of the skeleton. IMPRESSION: 1. Massive saddle pulmonary embolism with additional clot burden extending throughout both lungs involving central, lobar, segmental and subsegmental sized vessels, with both occlusive and nonocclusive thrombus and evidence of right-sided heart strain (RV to LV ratio 1.21) consistent with at least submassive (intermediate risk) PE. The presence of right heart strain has been associated with an increased risk of morbidity and mortality. Please  activate Code PE by paging [phone]. 2. Although the previously described mass in the superior segment of the right lower lobe appears relatively similar to prior examinations, there is clear progression of disease on today's study, with worsening postobstructive changes in the right lower lobe, worsening metastatic disease throughout the thoracic skeleton, enlarging bilateral adrenal metastasis (with direct invasion of the liver and potentially the superior aspect of the right kidney from the right adrenal mass), and worsening  pelvic and retroperitoneal lymphadenopathy, as detailed above. 3. IVC filter appears appropriately positioned. Notably, there is no significant burden of clot associated with the filter at this time, and no evidence of deep venous thrombosis in the pelvic veins. 4. Extensive colonic diverticulosis without evidence of acute diverticulitis at this time. 5. Aortic atherosclerosis (mild). 6. Additional incidental findings, as above. Critical Value/emergent results were called by telephone at the time of interpretation on 01/28/2017 at 1:01 pm to Dr. Alvy Bimler and Dr. Nehemiah Massed (for Dr. Lindi Adie), who verbally acknowledged these results. Aortic Atherosclerosis (ICD10-I70.0). Electronically Signed   By: Vinnie Langton M.D.   On: 01/28/2017 13:40   Ct Abdomen Pelvis W Contrast  Result Date: 01/28/2017 CLINICAL DATA:  71 year-old female with history of lung cancer about to start chemotherapy. Baseline CT examination. EXAM: CT CHEST, ABDOMEN, AND PELVIS WITH CONTRAST TECHNIQUE: Multidetector CT imaging of the chest, abdomen and pelvis was performed following the standard protocol during bolus administration of intravenous contrast. CONTRAST:  150mL ISOVUE-300 IOPAMIDOL (ISOVUE-300) INJECTION 61% COMPARISON:  Chest CT 11/02/2016.  PET-CT 11/23/2016. FINDINGS: CT CHEST FINDINGS Cardiovascular: Large filling defects in pulmonary artery branches throughout both lungs, including a large saddle embolus, as well as lobar, segmental and subsegmental sized emboli. Some of these appear occlusive (right lower lobe) while most are nonocclusive. Right ventricle appears mildly dilated when compared with the (RV diameter of approximately 5.2 cm versus LV diameter approximately 4.3 cm) resulting in an elevated RV to LV ratio of 1.21, concerning for right heart strain. Pulmonic trunk is not dilated at this time. There is no significant pericardial fluid, thickening or pericardial calcification. Aortic atherosclerosis. No definite coronary  artery calcifications. Mediastinum/Nodes: Extensive nodal tissue is again noted throughout the right hilar region, with individual lymph nodes measuring up to at least 1 cm in short axis. No other mediastinal or left hilar lymphadenopathy is noted. Esophagus is unremarkable in appearance. No axillary lymphadenopathy. Lungs/Pleura: Previously noted mass in the superior segment of the right lower lobe currently measures 1.9 x 2.6 x 4.0 cm (axial image 64 of series 4 and coronal image 114 of series 6). Compared a prior examinations there is again an abundant infrahilar soft tissue adjacent to the right lower lobe bronchus, with increasing narrowing of the bronchus on today's study best appreciated on axial image 28 of series 3), and increasing postobstructive changes in the basal segments of the right lower lobe common most of which appear impacted with secretions. More distally in the basal segments of the right lower lobe there is some peribronchovascular micronodularity, likely reflective of postobstructive changes. No other definite suspicious appearing pulmonary nodules or masses are noted in the left lung. No pleural effusions. Musculoskeletal: Multiple lytic lesions are noted throughout the visualized axial and appendicular skeleton, including a lesion in the posterior aspect of the T7 vertebral body, lesion in the posterior aspect of the T8 vertebral body on the left side which also involves the adjacent medial aspect of the left eighth rib which is expansile, lytic and fractured in multiple places (  pathologic fracture) with extension into the overlying soft tissues (best appreciated on axial image 32 of series 3), lesion in the posterior aspect of T12 on the right side extending into the transverse process, and large lytic lesion in the T12 spinous process. All of these osseous changes appear progressive compared to the prior study from 11/02/2016. Surgical clips in the left breast, presumably from prior  lumpectomy. CT ABDOMEN PELVIS FINDINGS Hepatobiliary: When compared to prior examinations, the previously noted right adrenal mass has enlarged, and is now invasive into the medial aspect of the right lobe of the liver occupying predominantly segment 7, where the intrahepatic portion of the mass measures up to 8.1 x 6.0 x 3.4 cm (axial image 49 of series 3 and coronal image 99 of series 6). No other hepatic lesions are confidently identified. No intra or extrahepatic biliary ductal dilatation. Gallbladder is normal in appearance. Pancreas: No pancreatic mass. No pancreatic ductal dilatation. No pancreatic or peripancreatic fluid or inflammatory changes. Spleen: Unremarkable. Adrenals/Urinary Tract: Bilateral adrenal masses are again noted, but significantly increased in size compared to the prior study, with the right adrenal mass now invading the right lobe of the liver (discussed above). The overall dimensions of the right adrenal mass (including the exclusively intrahepatic portion which was reported above) are approximately 6.2 x 6.0 x 8.1 cm (coronal image 100 of series 6 and axial image 49 of series 3). The dimensions of the left adrenal mass are currently 6.7 x 3.4 x 3.7 cm (axial image 53 of series 3 and coronal image 94 of series 6). Left kidney is normal in appearance. The previously described right adrenal mass is now causing distortion of upper pole the right kidney such that early renal invasion is not entirely excluded (best appreciated on coronal image 105 of series 6). No hydroureteronephrosis. Urinary bladder is normal in appearance. Stomach/Bowel: The appearance of the stomach is normal. There is no pathologic dilatation of small bowel or colon. Numerous colonic diverticulae are noted, particularly in the sigmoid colon, without surrounding inflammatory changes to suggest an acute diverticulitis at this time. Normal appendix. Vascular/Lymphatic: Aortic atherosclerosis (mild), without evidence of  aneurysm or dissection in the abdominal or pelvic vasculature. IVC filter noted, with tip terminating just below the level of the renal veins. Notably, there does not appear to be significant clot burden associated with the IVC filter at this time. Additionally, pelvic veins appear widely patent. Extensive retroperitoneal lymphadenopathy noted, with the largest lymph nodes measuring up to 1.7 cm in the left para-aortic nodal station (axial image 67 of series 3). Multiple other borderline enlarged upper abdominal ligament lymph nodes are also noted, nonspecific but suspicious. In the subcutaneous fat immediately anterior to the lower anterior abdominal wall in the pelvic region (axial image 109 of series 3) there is a soft tissue attenuation lesion that measures 1.5 cm in short axis (axial image 109 of series 3), new compared to the prior study, likely to represent enlarged lymph node. Reproductive: Probable fibroid uterus with multiple lesions, the largest of which measures 5.4 cm in the fundus of the uterus and is partially calcified. Retroverted uterus. Ovaries are unremarkable in appearance. Other: No significant volume of ascites. No pneumoperitoneum. Musculoskeletal: There are no aggressive appearing lytic or blastic lesions noted in the visualized portions of the skeleton. IMPRESSION: 1. Massive saddle pulmonary embolism with additional clot burden extending throughout both lungs involving central, lobar, segmental and subsegmental sized vessels, with both occlusive and nonocclusive thrombus and evidence of right-sided heart  strain (RV to LV ratio 1.21) consistent with at least submassive (intermediate risk) PE. The presence of right heart strain has been associated with an increased risk of morbidity and mortality. Please activate Code PE by paging [phone]. 2. Although the previously described mass in the superior segment of the right lower lobe appears relatively similar to prior examinations, there is  clear progression of disease on today's study, with worsening postobstructive changes in the right lower lobe, worsening metastatic disease throughout the thoracic skeleton, enlarging bilateral adrenal metastasis (with direct invasion of the liver and potentially the superior aspect of the right kidney from the right adrenal mass), and worsening pelvic and retroperitoneal lymphadenopathy, as detailed above. 3. IVC filter appears appropriately positioned. Notably, there is no significant burden of clot associated with the filter at this time, and no evidence of deep venous thrombosis in the pelvic veins. 4. Extensive colonic diverticulosis without evidence of acute diverticulitis at this time. 5. Aortic atherosclerosis (mild). 6. Additional incidental findings, as above. Critical Value/emergent results were called by telephone at the time of interpretation on 01/28/2017 at 1:01 pm to Dr. Alvy Bimler and Dr. Nehemiah Massed (for Dr. Lindi Adie), who verbally acknowledged these results. Aortic Atherosclerosis (ICD10-I70.0). Electronically Signed   By: Vinnie Langton M.D.   On: 01/28/2017 13:40   Ir Ivc Filter Plmt / S&i /img Guid/mod Sed  Result Date: 01/27/2017 INDICATION: Acute femoral popliteal DVT, patient cannot be anticoagulated, hemorrhagic intracranial metastatic disease EXAM: ULTRASOUND GUIDANCE FOR VASCULAR ACCESS IVC CATHETERIZATION AND VENOGRAM IVC FILTER INSERTION Date:  10/4/201810/07/2016 5:22 pm Radiologist:  M. Daryll Brod, MD Guidance:  Ultrasound and fluoroscopic CONTRAST:  65 cc Isovue-300 MEDICATIONS: 1% lidocaine local ANESTHESIA/SEDATION: Moderate Sedation Time: None. The patient was continuously monitored during the procedure by the interventional radiology nurse under my direct supervision. FLUOROSCOPY TIME:  Fluoroscopy Time: 3 minutes 24 seconds (25 mGy). COMPLICATIONS: None immediate. PROCEDURE: Informed consent was obtained from the patient following explanation of the procedure, risks, benefits and  alternatives. The patient understands, agrees and consents for the procedure. All questions were addressed. A time out was performed. Maximal barrier sterile technique utilized including caps, mask, sterile gowns, sterile gloves, large sterile drape, hand hygiene, and betadine prep. Under sterile condition and local anesthesia, right internal jugular venous access was performed with ultrasound. Over a guide wire, the IVC filter delivery sheath and inner dilator were advanced into the IVC just above the IVC bifurcation. Contrast injection was performed for an IVC venogram. IVC VENOGRAM: The IVC is patent. No evidence of thrombus, stenosis, or occlusion. No variant venous anatomy. The renal veins are identified at L1. IVC FILTER INSERTION: Through the delivery sheath, the bard Denali IVC filter was deployed in the infrarenal IVC at the L2-3 level just below the renal veins and above the IVC bifurcation. Contrast injection confirmed position. There is good apposition of the filter against the IVC. The delivery sheath was removed and hemostasis was obtained with compression for 5 minutes. The patient tolerated the procedure well. No immediate complications. IMPRESSION: Ultrasound and fluoroscopically guided infrarenal IVC filter insertion. PLAN: Due to patient related comorbidities and/or clinical necessity, this IVC filter should be considered a permanent device. This patient will not be actively followed for future filter retrieval. Electronically Signed   By: Jerilynn Mages.  Shick M.D.   On: 01/27/2017 07:46   Dg Shoulder Left  Result Date: 01/25/2017 CLINICAL DATA:  History of left shoulder subluxation. 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 EXAM: LEFT SHOULDER - 2+ VIEW COMPARISON:  Chest x-ray dated October 24, 2016 which included portions of the left shoulder. FINDINGS: The bones are subjectively adequately mineralized. The glenohumeral joint space is well maintained. The  subacromial subdeltoid space is normal. As best as can be determined the acromioclavicular joint is also normal. There is no evidence of acute or old fracture. IMPRESSION: There is no acute or significant chronic bony abnormality of the left shoulder. Electronically Signed   By: David  Martinique M.D.   On: 01/25/2017 16:20    Microbiology: Recent Results (from the past 240 hour(s))  Culture, Urine     Status: None   Collection Time: 01/26/17  2:43 PM  Result Value Ref Range Status   Specimen Description URINE, CATHETERIZED  Final   Special Requests NONE  Final   Culture NO GROWTH  Final   Report Status 01/27/2017 FINAL  Final     Labs: Basic Metabolic Panel:  Recent Labs Lab 01/25/17 0652 01/27/17 0440  NA 133* 133*  K 3.6 3.4*  CL 101 106  CO2 23 23  GLUCOSE 104* 111*  BUN 13 8  CREATININE 0.60 0.53  CALCIUM 8.4* 8.1*   Liver Function Tests: No results for input(s): AST, ALT, ALKPHOS, BILITOT, PROT, ALBUMIN in the last 168 hours. No results for input(s): LIPASE, AMYLASE in the last 168 hours. No results for input(s): AMMONIA in the last 168 hours. CBC:  Recent Labs Lab 01/25/17 0652 01/27/17 0440  WBC 5.0 6.1  NEUTROABS 3.3 4.6  HGB 10.0* 10.0*  HCT 30.6* 31.2*  MCV 87.2 87.4  PLT 120* 124*   Cardiac Enzymes: No results for input(s): CKTOTAL, CKMB, CKMBINDEX, TROPONINI in the last 168 hours. BNP: BNP (last 3 results) No results for input(s): BNP in the last 8760 hours.  ProBNP (last 3 results) No results for input(s): PROBNP in the last 8760 hours.  CBG:  Recent Labs Lab 01/25/17 0641 01/26/17 0545 01/27/17 0706 01/28/17 0610 01/31/17 0618  GLUCAP 110* 114* 103* 91 139*       SignedDomenic Polite MD.  [facility] 01/31/2017, 2:16 PM

## 2017-01-31 NOTE — Progress Notes (Signed)
Respondd to [facility] Consult to visit with patient. Patient is being bathed at the time of my visit. Family at bedside assisting staff. Per family not good time.  Chaplain available as needed.  Jaclynn Major, [place], [facility], Pager [phone]

## 2017-01-31 NOTE — Progress Notes (Signed)
[facility] Liaison:  RN   Received request from Ottis Stain, for patient/family interest in [facility].  Chart reviewed and spoke with patient/family to acknowledge referral.  Unfortunately [facility] is not able to offer a room today.  Patient/family and LCSW are aware [facility] liaison will follow up with LCSW and patient/family tomorrow or sooner if room becomes available.  Please do not hesitate to call with questions.    Thank you for the referral.  Edyth Gunnels, RN, BSN [facility] Liaison [phone]  All hospital liaisons are now on [place].

## 2017-02-01 LAB — GLUCOSE, CAPILLARY: GLUCOSE-CAPILLARY: 143 mg/dL — AB (ref 65–99)

## 2017-02-01 NOTE — Discharge Summary (Signed)
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  In July 2018 was diagnosed with metastatic Lung Ca and in 11/2016 found to have brain mets was hospitalized for focal seizures and generalized weakness, started on Keppra and Decadron, then had radiation to the brain 9/10 onwards, after this was admitted with L sided weakness on 9/23, found to have 4.5cm R fronto-parietal hemorrhagic metastasis-decadron dose increased and keppra increased, then discahrged to Rehab 9/26. She was progressing slowly with PT and had a CT chest was ordered by Dr.Gudena for staging, which 10/6 afternoon noted massive Saddle pulmonary embolism. TRH was called by covering Rehab MD to admit to hospital 10/6 afternoon  Hospital Course:   Acute massive saddle pulmonary embolism ([place]) 10/6 -she is critically ill with very grave  prognosis , likely acute on chronic -due to relative lack of symptoms and stability of vital signs. -s/p IVC filter 10/4 for DVT -placed while in Rehab by IR -unfortunately unable to anticoagulate due to 4.5cm hemorrhagic brain mets and not candidate for any TPA Rx -10/6, Dr. Broadus John called and discussed her case with Dr. Cecil Cobbs with Neuro oncology who follows the patient he recommended against anticoagulation, Recommended palliative care discussions. Dr. Broadus John also called and discussed case with Dr. Roselie Awkward (pulmonary critical care on-call) 10/6, he also agreed that unfortunately with hemorrhagic metastasis and saddle PE and already having an IVC filter, she has a contraindication to anticoagulation and nothing else medically to offer her at this time -Seen by Oncology Dr. Alvy Bimler over weekend and Dr.Gudena  -Patient and sister had a very difficult time gripping with this diagnosis and this is a huge shock to them as they were expecting to start Chemo soon, now they understand the situation and agreeable to comfort focused care. 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 Qty: 15 mL, Refills: 0      CONTINUE these medications which have CHANGED   Details  LORazepam (ATIVAN) 1 MG tablet Take 0.5 tablets (0.5 mg total) by mouth every 4 (four) hours as needed for anxiety. Qty: 30 tablet, Refills: 0      CONTINUE these medications which have NOT CHANGED   Details  acetaminophen (TYLENOL) 325 MG tablet Take 2 tablets (650 mg total) by mouth every 6 (six) hours as needed for mild pain (or Fever >/= 101). Qty: 30 tablet, Refills: 0    dexamethasone (DECADRON) 4 MG tablet Take 1 tablet (4 mg total) by mouth every 12 (twelve) hours. Qty: 60 tablet, Refills: 0    escitalopram (LEXAPRO) 20 MG tablet Take 20 mg by mouth daily.    levETIRAcetam (KEPPRA) 1000 MG tablet Take 1 tablet (1,000 mg total) by mouth 2 (two) times daily. Qty: 60 tablet, Refills: 0    pantoprazole (PROTONIX) 40 MG tablet Take 1 tablet (40 mg total) by mouth 2 (two) times daily. Qty: 60 tablet, Refills: 0      STOP taking these medications     protein supplement shake (PREMIER PROTEIN) LIQD        Allergies  Allergen Reactions  . Codeine Nausea And Vomiting  . Penicillins Rash    Has patient had a PCN reaction causing immediate rash, facial/tongue/throat swelling, SOB or lightheadedness with hypotension: Yes Has patient had a PCN reaction causing severe rash involving mucus membranes or skin necrosis: No Has patient had a PCN reaction that required hospitalization: Yes Has patient had a PCN reaction occurring within the last 10 years: No If all of the above answers are "NO", then may proceed with Cephalosporin use.   . Sulfa Antibiotics Rash  . Tetracyclines & Related Rash    Patient states Acromycin      The results of significant diagnostics from this hospitalization (including imaging, microbiology, ancillary and laboratory) are listed below  for reference.    Significant Diagnostic Studies: Dg Chest 2 View  Result Date: 01/26/2017 CLINICAL DATA:  Lethargy today. 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 Former smoker. EXAM: CHEST  2 VIEW COMPARISON:  Chest x-ray of July 2nd 2018 FINDINGS: The right hemidiaphragm is elevated as compared to the left. There is persistent abnormal density in the right perihilar region. On the left there is no focal infiltrate or evidence of a parenchymal mass. The heart is top-normal in size. The pulmonary vascularity is normal. The mediastinum is normal in width. IMPRESSION: New elevation of the right hemidiaphragm. Stable parenchymal density in the right mid lung consistent with known metastatic disease. No CHF nor pneumonia. Electronically Signed   By: David  Martinique M.D.   On: 01/26/2017 13:30   Ct Head Wo Contrast  Result Date: 01/15/2017 CLINICAL DATA:  Metastatic  cancer.  Increased left-sided weakness EXAM: CT HEAD WITHOUT CONTRAST TECHNIQUE: Contiguous axial images were obtained from the base of the skull through the vertex without intravenous contrast. COMPARISON:  CT head 12/30/2016, MRI 12/20/2016 and 12/31/2016. FINDINGS: Brain: Hemorrhagic metastatic deposit in the right frontal parietal lobe has increased in size. There has been further hemorrhage since the prior studies. The lesion now  measures 4.5 x 4.3 cm with surrounding edema. Local mass-effect without shift of the midline structures. Enhancing metastatic deposit in the left parietal lobe is not identified on CT without contrast but is seen on the prior MRI. No acute infarct. No other areas of hemorrhage. Negative for hydrocephalus or shift of the midline structures. Vascular: Negative for hyperdense vessel Skull: Negative Sinuses/Orbits: Negative Other: None IMPRESSION: 4.3 x 4.5 cm hemorrhagic metastatic deposit in the right frontal parietal lobe has increased in size since the prior studies. There has been  further hemorrhage in the lesion. No shift of the midline structures. Additional metastatic deposit left parietal lobe not identified on CT. These results were called by telephone at the time of interpretation on 01/15/2017 at 2:21 pm to Dr. Davonna Belling , who verbally acknowledged these results. Electronically Signed   By: Franchot Gallo M.D.   On: 01/15/2017 14:18   Ct Chest W Contrast  Result Date: 01/28/2017 CLINICAL DATA:  71 year-old female with history of lung cancer about to start chemotherapy. Baseline CT examination. EXAM: CT CHEST, ABDOMEN, AND PELVIS WITH CONTRAST TECHNIQUE: Multidetector CT imaging of the chest, abdomen and pelvis was performed following the standard protocol during bolus administration of intravenous contrast. CONTRAST:  196mL ISOVUE-300 IOPAMIDOL (ISOVUE-300) INJECTION 61% COMPARISON:  Chest CT 11/02/2016.  PET-CT 11/23/2016. FINDINGS: CT CHEST FINDINGS Cardiovascular: Large filling defects in pulmonary artery branches throughout both lungs, including a large saddle embolus, as well as lobar, segmental and subsegmental sized emboli. Some of these appear occlusive (right lower lobe) while most are nonocclusive. Right ventricle appears mildly dilated when compared with the (RV diameter of approximately 5.2 cm versus LV diameter approximately 4.3 cm) resulting in an elevated RV to LV ratio of 1.21, concerning for right heart strain. Pulmonic trunk is not dilated at this time. There is no significant pericardial fluid, thickening or pericardial calcification. Aortic atherosclerosis. No definite coronary artery calcifications. Mediastinum/Nodes: Extensive nodal tissue is again noted throughout the right hilar region, with individual lymph nodes measuring up to at least 1 cm in short axis. No other mediastinal or left hilar lymphadenopathy is noted. Esophagus is unremarkable in appearance. No axillary lymphadenopathy. Lungs/Pleura: Previously noted mass in the superior segment of the  right lower lobe currently measures 1.9 x 2.6 x 4.0 cm (axial image 64 of series 4 and coronal image 114 of series 6). Compared a prior examinations there is again an abundant infrahilar soft tissue adjacent to the right lower lobe bronchus, with increasing narrowing of the bronchus on today's study best appreciated on axial image 28 of series 3), and increasing postobstructive changes in the basal segments of the right lower lobe common most of which appear impacted with secretions. More distally in the basal segments of the right lower lobe there is some peribronchovascular micronodularity, likely reflective of postobstructive changes. No other definite suspicious appearing pulmonary nodules or masses are noted in the left lung. No pleural effusions. Musculoskeletal: Multiple lytic lesions are noted throughout the visualized axial and appendicular skeleton, including a lesion in the posterior aspect of the T7 vertebral body, lesion in the posterior aspect of the T8 vertebral body on the left side which also involves the adjacent medial aspect of the left eighth rib which is expansile, lytic and fractured in multiple places (pathologic fracture) with extension into the overlying soft tissues (best appreciated on axial image 32 of series 3), lesion in the posterior aspect of T12 on the right side extending into the transverse process, and large  lytic lesion in the T12 spinous process. All of these osseous changes appear progressive compared to the prior study from 11/02/2016. Surgical clips in the left breast, presumably from prior lumpectomy. CT ABDOMEN PELVIS FINDINGS Hepatobiliary: When compared to prior examinations, the previously noted right adrenal mass has enlarged, and is now invasive into the medial aspect of the right lobe of the liver occupying predominantly segment 7, where the intrahepatic portion of the mass measures up to 8.1 x 6.0 x 3.4 cm (axial image 49 of series 3 and coronal image 99 of series  6). No other hepatic lesions are confidently identified. No intra or extrahepatic biliary ductal dilatation. Gallbladder is normal in appearance. Pancreas: No pancreatic mass. No pancreatic ductal dilatation. No pancreatic or peripancreatic fluid or inflammatory changes. Spleen: Unremarkable. Adrenals/Urinary Tract: Bilateral adrenal masses are again noted, but significantly increased in size compared to the prior study, with the right adrenal mass now invading the right lobe of the liver (discussed above). The overall dimensions of the right adrenal mass (including the exclusively intrahepatic portion which was reported above) are approximately 6.2 x 6.0 x 8.1 cm (coronal image 100 of series 6 and axial image 49 of series 3). The dimensions of the left adrenal mass are currently 6.7 x 3.4 x 3.7 cm (axial image 53 of series 3 and coronal image 94 of series 6). Left kidney is normal in appearance. The previously described right adrenal mass is now causing distortion of upper pole the right kidney such that early renal invasion is not entirely excluded (best appreciated on coronal image 105 of series 6). No hydroureteronephrosis. Urinary bladder is normal in appearance. Stomach/Bowel: The appearance of the stomach is normal. There is no pathologic dilatation of small bowel or colon. Numerous colonic diverticulae are noted, particularly in the sigmoid colon, without surrounding inflammatory changes to suggest an acute diverticulitis at this time. Normal appendix. Vascular/Lymphatic: Aortic atherosclerosis (mild), without evidence of aneurysm or dissection in the abdominal or pelvic vasculature. IVC filter noted, with tip terminating just below the level of the renal veins. Notably, there does not appear to be significant clot burden associated with the IVC filter at this time. Additionally, pelvic veins appear widely patent. Extensive retroperitoneal lymphadenopathy noted, with the largest lymph nodes measuring up to  1.7 cm in the left para-aortic nodal station (axial image 67 of series 3). Multiple other borderline enlarged upper abdominal ligament lymph nodes are also noted, nonspecific but suspicious. In the subcutaneous fat immediately anterior to the lower anterior abdominal wall in the pelvic region (axial image 109 of series 3) there is a soft tissue attenuation lesion that measures 1.5 cm in short axis (axial image 109 of series 3), new compared to the prior study, likely to represent enlarged lymph node. Reproductive: Probable fibroid uterus with multiple lesions, the largest of which measures 5.4 cm in the fundus of the uterus and is partially calcified. Retroverted uterus. Ovaries are unremarkable in appearance. Other: No significant volume of ascites. No pneumoperitoneum. Musculoskeletal: There are no aggressive appearing lytic or blastic lesions noted in the visualized portions of the skeleton. IMPRESSION: 1. Massive saddle pulmonary embolism with additional clot burden extending throughout both lungs involving central, lobar, segmental and subsegmental sized vessels, with both occlusive and nonocclusive thrombus and evidence of right-sided heart strain (RV to LV ratio 1.21) consistent with at least submassive (intermediate risk) PE. The presence of right heart strain has been associated with an increased risk of morbidity and mortality. Please activate Code PE by  paging [phone]. 2. Although the previously described mass in the superior segment of the right lower lobe appears relatively similar to prior examinations, there is clear progression of disease on today's study, with worsening postobstructive changes in the right lower lobe, worsening metastatic disease throughout the thoracic skeleton, enlarging bilateral adrenal metastasis (with direct invasion of the liver and potentially the superior aspect of the right kidney from the right adrenal mass), and worsening pelvic and retroperitoneal lymphadenopathy,  as detailed above. 3. IVC filter appears appropriately positioned. Notably, there is no significant burden of clot associated with the filter at this time, and no evidence of deep venous thrombosis in the pelvic veins. 4. Extensive colonic diverticulosis without evidence of acute diverticulitis at this time. 5. Aortic atherosclerosis (mild). 6. Additional incidental findings, as above. Critical Value/emergent results were called by telephone at the time of interpretation on 01/28/2017 at 1:01 pm to Dr. Alvy Bimler and Dr. Nehemiah Massed (for Dr. Lindi Adie), who verbally acknowledged these results. Aortic Atherosclerosis (ICD10-I70.0). Electronically Signed   By: Vinnie Langton M.D.   On: 01/28/2017 13:40   Ct Abdomen Pelvis W Contrast  Result Date: 01/28/2017 CLINICAL DATA:  71 year-old female with history of lung cancer about to start chemotherapy. Baseline CT examination. EXAM: CT CHEST, ABDOMEN, AND PELVIS WITH CONTRAST TECHNIQUE: Multidetector CT imaging of the chest, abdomen and pelvis was performed following the standard protocol during bolus administration of intravenous contrast. CONTRAST:  13mL ISOVUE-300 IOPAMIDOL (ISOVUE-300) INJECTION 61% COMPARISON:  Chest CT 11/02/2016.  PET-CT 11/23/2016. FINDINGS: CT CHEST FINDINGS Cardiovascular: Large filling defects in pulmonary artery branches throughout both lungs, including a large saddle embolus, as well as lobar, segmental and subsegmental sized emboli. Some of these appear occlusive (right lower lobe) while most are nonocclusive. Right ventricle appears mildly dilated when compared with the (RV diameter of approximately 5.2 cm versus LV diameter approximately 4.3 cm) resulting in an elevated RV to LV ratio of 1.21, concerning for right heart strain. Pulmonic trunk is not dilated at this time. There is no significant pericardial fluid, thickening or pericardial calcification. Aortic atherosclerosis. No definite coronary artery calcifications. Mediastinum/Nodes:  Extensive nodal tissue is again noted throughout the right hilar region, with individual lymph nodes measuring up to at least 1 cm in short axis. No other mediastinal or left hilar lymphadenopathy is noted. Esophagus is unremarkable in appearance. No axillary lymphadenopathy. Lungs/Pleura: Previously noted mass in the superior segment of the right lower lobe currently measures 1.9 x 2.6 x 4.0 cm (axial image 64 of series 4 and coronal image 114 of series 6). Compared a prior examinations there is again an abundant infrahilar soft tissue adjacent to the right lower lobe bronchus, with increasing narrowing of the bronchus on today's study best appreciated on axial image 28 of series 3), and increasing postobstructive changes in the basal segments of the right lower lobe common most of which appear impacted with secretions. More distally in the basal segments of the right lower lobe there is some peribronchovascular micronodularity, likely reflective of postobstructive changes. No other definite suspicious appearing pulmonary nodules or masses are noted in the left lung. No pleural effusions. Musculoskeletal: Multiple lytic lesions are noted throughout the visualized axial and appendicular skeleton, including a lesion in the posterior aspect of the T7 vertebral body, lesion in the posterior aspect of the T8 vertebral body on the left side which also involves the adjacent medial aspect of the left eighth rib which is expansile, lytic and fractured in multiple places (pathologic fracture) with extension  into the overlying soft tissues (best appreciated on axial image 32 of series 3), lesion in the posterior aspect of T12 on the right side extending into the transverse process, and large lytic lesion in the T12 spinous process. All of these osseous changes appear progressive compared to the prior study from 11/02/2016. Surgical clips in the left breast, presumably from prior lumpectomy. CT ABDOMEN PELVIS FINDINGS  Hepatobiliary: When compared to prior examinations, the previously noted right adrenal mass has enlarged, and is now invasive into the medial aspect of the right lobe of the liver occupying predominantly segment 7, where the intrahepatic portion of the mass measures up to 8.1 x 6.0 x 3.4 cm (axial image 49 of series 3 and coronal image 99 of series 6). No other hepatic lesions are confidently identified. No intra or extrahepatic biliary ductal dilatation. Gallbladder is normal in appearance. Pancreas: No pancreatic mass. No pancreatic ductal dilatation. No pancreatic or peripancreatic fluid or inflammatory changes. Spleen: Unremarkable. Adrenals/Urinary Tract: Bilateral adrenal masses are again noted, but significantly increased in size compared to the prior study, with the right adrenal mass now invading the right lobe of the liver (discussed above). The overall dimensions of the right adrenal mass (including the exclusively intrahepatic portion which was reported above) are approximately 6.2 x 6.0 x 8.1 cm (coronal image 100 of series 6 and axial image 49 of series 3). The dimensions of the left adrenal mass are currently 6.7 x 3.4 x 3.7 cm (axial image 53 of series 3 and coronal image 94 of series 6). Left kidney is normal in appearance. The previously described right adrenal mass is now causing distortion of upper pole the right kidney such that early renal invasion is not entirely excluded (best appreciated on coronal image 105 of series 6). No hydroureteronephrosis. Urinary bladder is normal in appearance. Stomach/Bowel: The appearance of the stomach is normal. There is no pathologic dilatation of small bowel or colon. Numerous colonic diverticulae are noted, particularly in the sigmoid colon, without surrounding inflammatory changes to suggest an acute diverticulitis at this time. Normal appendix. Vascular/Lymphatic: Aortic atherosclerosis (mild), without evidence of aneurysm or dissection in the abdominal or  pelvic vasculature. IVC filter noted, with tip terminating just below the level of the renal veins. Notably, there does not appear to be significant clot burden associated with the IVC filter at this time. Additionally, pelvic veins appear widely patent. Extensive retroperitoneal lymphadenopathy noted, with the largest lymph nodes measuring up to 1.7 cm in the left para-aortic nodal station (axial image 67 of series 3). Multiple other borderline enlarged upper abdominal ligament lymph nodes are also noted, nonspecific but suspicious. In the subcutaneous fat immediately anterior to the lower anterior abdominal wall in the pelvic region (axial image 109 of series 3) there is a soft tissue attenuation lesion that measures 1.5 cm in short axis (axial image 109 of series 3), new compared to the prior study, likely to represent enlarged lymph node. Reproductive: Probable fibroid uterus with multiple lesions, the largest of which measures 5.4 cm in the fundus of the uterus and is partially calcified. Retroverted uterus. Ovaries are unremarkable in appearance. Other: No significant volume of ascites. No pneumoperitoneum. Musculoskeletal: There are no aggressive appearing lytic or blastic lesions noted in the visualized portions of the skeleton. IMPRESSION: 1. Massive saddle pulmonary embolism with additional clot burden extending throughout both lungs involving central, lobar, segmental and subsegmental sized vessels, with both occlusive and nonocclusive thrombus and evidence of right-sided heart strain (RV to LV  ratio 1.21) consistent with at least submassive (intermediate risk) PE. The presence of right heart strain has been associated with an increased risk of morbidity and mortality. Please activate Code PE by paging [phone]. 2. Although the previously described mass in the superior segment of the right lower lobe appears relatively similar to prior examinations, there is clear progression of disease on today's  study, with worsening postobstructive changes in the right lower lobe, worsening metastatic disease throughout the thoracic skeleton, enlarging bilateral adrenal metastasis (with direct invasion of the liver and potentially the superior aspect of the right kidney from the right adrenal mass), and worsening pelvic and retroperitoneal lymphadenopathy, as detailed above. 3. IVC filter appears appropriately positioned. Notably, there is no significant burden of clot associated with the filter at this time, and no evidence of deep venous thrombosis in the pelvic veins. 4. Extensive colonic diverticulosis without evidence of acute diverticulitis at this time. 5. Aortic atherosclerosis (mild). 6. Additional incidental findings, as above. Critical Value/emergent results were called by telephone at the time of interpretation on 01/28/2017 at 1:01 pm to Dr. Alvy Bimler and Dr. Nehemiah Massed (for Dr. Lindi Adie), who verbally acknowledged these results. Aortic Atherosclerosis (ICD10-I70.0). Electronically Signed   By: Vinnie Langton M.D.   On: 01/28/2017 13:40   Ir Ivc Filter Plmt / S&i /img Guid/mod Sed  Result Date: 01/27/2017 INDICATION: Acute femoral popliteal DVT, patient cannot be anticoagulated, hemorrhagic intracranial metastatic disease EXAM: ULTRASOUND GUIDANCE FOR VASCULAR ACCESS IVC CATHETERIZATION AND VENOGRAM IVC FILTER INSERTION Date:  10/4/201810/07/2016 5:22 pm Radiologist:  M. Daryll Brod, MD Guidance:  Ultrasound and fluoroscopic CONTRAST:  65 cc Isovue-300 MEDICATIONS: 1% lidocaine local ANESTHESIA/SEDATION: Moderate Sedation Time: None. The patient was continuously monitored during the procedure by the interventional radiology nurse under my direct supervision. FLUOROSCOPY TIME:  Fluoroscopy Time: 3 minutes 24 seconds (25 mGy). COMPLICATIONS: None immediate. PROCEDURE: Informed consent was obtained from the patient following explanation of the procedure, risks, benefits and alternatives. The patient understands,  agrees and consents for the procedure. All questions were addressed. A time out was performed. Maximal barrier sterile technique utilized including caps, mask, sterile gowns, sterile gloves, large sterile drape, hand hygiene, and betadine prep. Under sterile condition and local anesthesia, right internal jugular venous access was performed with ultrasound. Over a guide wire, the IVC filter delivery sheath and inner dilator were advanced into the IVC just above the IVC bifurcation. Contrast injection was performed for an IVC venogram. IVC VENOGRAM: The IVC is patent. No evidence of thrombus, stenosis, or occlusion. No variant venous anatomy. The renal veins are identified at L1. IVC FILTER INSERTION: Through the delivery sheath, the bard Denali IVC filter was deployed in the infrarenal IVC at the L2-3 level just below the renal veins and above the IVC bifurcation. Contrast injection confirmed position. There is good apposition of the filter against the IVC. The delivery sheath was removed and hemostasis was obtained with compression for 5 minutes. The patient tolerated the procedure well. No immediate complications. IMPRESSION: Ultrasound and fluoroscopically guided infrarenal IVC filter insertion. PLAN: Due to patient related comorbidities and/or clinical necessity, this IVC filter should be considered a permanent device. This patient will not be actively followed for future filter retrieval. Electronically Signed   By: Jerilynn Mages.  Shick M.D.   On: 01/27/2017 07:46   Dg Shoulder Left  Result Date: 01/25/2017 CLINICAL DATA:  History of left shoulder subluxation. 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 EXAM: LEFT  SHOULDER - 2+ VIEW COMPARISON:  Chest x-ray dated October 24, 2016 which included portions of the left shoulder. FINDINGS: The bones are subjectively adequately mineralized. The glenohumeral joint space is well maintained. The subacromial subdeltoid space is normal. As best  as can be determined the acromioclavicular joint is also normal. There is no evidence of acute or old fracture. IMPRESSION: There is no acute or significant chronic bony abnormality of the left shoulder. Electronically Signed   By: David  Martinique M.D.   On: 01/25/2017 16:20    Microbiology: Recent Results (from the past 240 hour(s))  Culture, Urine     Status: None   Collection Time: 01/26/17  2:43 PM  Result Value Ref Range Status   Specimen Description URINE, CATHETERIZED  Final   Special Requests NONE  Final   Culture NO GROWTH  Final   Report Status 01/27/2017 FINAL  Final     Labs: Basic Metabolic Panel:  Recent Labs Lab 01/27/17 0440  NA 133*  K 3.4*  CL 106  CO2 23  GLUCOSE 111*  BUN 8  CREATININE 0.53  CALCIUM 8.1*   Liver Function Tests: No results for input(s): AST, ALT, ALKPHOS, BILITOT, PROT, ALBUMIN in the last 168 hours. No results for input(s): LIPASE, AMYLASE in the last 168 hours. No results for input(s): AMMONIA in the last 168 hours. CBC:  Recent Labs Lab 01/27/17 0440  WBC 6.1  NEUTROABS 4.6  HGB 10.0*  HCT 31.2*  MCV 87.4  PLT 124*   Cardiac Enzymes: No results for input(s): CKTOTAL, CKMB, CKMBINDEX, TROPONINI in the last 168 hours. BNP: BNP (last 3 results) No results for input(s): BNP in the last 8760 hours.  ProBNP (last 3 results) No results for input(s): PROBNP in the last 8760 hours.  CBG:  Recent Labs Lab 01/26/17 0545 01/27/17 0706 01/28/17 0610 01/31/17 0618 02/01/17 0419  GLUCAP 114* 103* 91 139* 143*       Signed:  Dessa Phi D.O.  [facility] 02/01/2017, 10:09 AM

## 2017-02-01 NOTE — Progress Notes (Signed)
Clinical Social Worker facilitated patient discharge including contacting patient family and facility to confirm patient discharge plans.  Clinical information faxed to facility and family agreeable with plan.  CSW arranged ambulance transport via [facility] to [facility] .  RN to call [phone] report prior to discharge.  Clinical Social Worker will sign off for now as social work intervention is no longer needed. Please consult [place] again if new need arises.  Rhea Pink, MSW, [place]

## 2017-02-01 NOTE — Care Management Important Message (Signed)
Important Message  Patient Details  Name: CAMRYNN MCCLINTIC MRN: [id number] Date of Birth: 1945/08/21   Medicare Important Message Given:  Yes    Nathen May 02/01/2017, 9:58 AM

## 2017-02-01 NOTE — Progress Notes (Signed)
Patient requested and received ativan as ordered. Foley emptied, report called to [facility]ospice Waiting for [facility] to transport

## 2017-02-01 NOTE — Care Management Note (Signed)
Case Management Note Marvetta Gibbons RN, BSN Unit 4E-Case Manager [phone]  Patient Details  Name: Kelly Stuart MRN: [id number] Date of Birth: Feb 27, 1946  Subjective/Objective:   Pt admitted with Acute massive saddle pulmonary embolism ([place]) 10/6- hx Metastatic lung cancer                 Action/Plan: PTA pt was at IP rehab- however per MD note prognosis grim- PC consulted- anticipate hospital death vs residential hospice-  CM and CSW will follow.   Expected Discharge Date:  02/01/17               Expected Discharge Plan:  [place]  In-House Referral:  Clinical Social Work  Discharge planning Services  CM Consult  Post Acute Care Choice:  NA Choice offered to:  NA  DME Arranged:    DME Agency:     HH Arranged:    [place] Agency:     Status of Service:  Completed, signed off  If discussed at [employer] [facility] [employer], dates discussed:    Discharge Disposition: [place]   Additional Comments:  02/01/17- Stuart, CM- CSW following for plan to tx pt to [facility]-  They have a bed available to day- and pt to d/c to [place] following for d/c needs.   Dawayne Patricia, RN 02/01/2017, 10:57 AM

## 2017-02-01 NOTE — Progress Notes (Signed)
[facility] Liaison:  RN visit  Received request from Ottis Stain, for family interest in [facility] with request for transfer today.  Chart reviewed.  Met with patient and sisters to confirm interest and explain services.  Patient/family agreeable to transfer today.  Caryl Pina, LCSW, aware.  Registration paperwork completed today at 1030am.  Dr. Orpah Melter to assume care per patient request.  Please fax discharge summary to [phone].  RN, please call report to [phone].  Please arrange transport for patient to arrive as soon as possible.   Thank you for this referral.  Edyth Gunnels, RN, [facility] Liaison [phone]  All hospital liaisons are now on [place].

## 2017-02-01 NOTE — Progress Notes (Signed)
   02/01/17 1028  Clinical Encounter Type  Visited With Patient  Visit Type Follow-up  Spiritual Encounters  Spiritual Needs Prayer;Emotional   Follow up after visit yesterday in response to [facility].  Patient was also know to Jovita Kussmaul at [facility]. Lattie Haw suggested I present patient with prayer shawl and I did.  She was very Patent attorney.  She had an opportunity to unpack what is on her heart and mind during this part of her journey.  We prayed together and will follow as needed. Chaplain Katherene Ponto

## 2017-02-03 ENCOUNTER — Ambulatory Visit: Payer: Medicare Other | Admitting: Rehabilitative and Restorative Service Providers"

## 2017-02-06 ENCOUNTER — Other Ambulatory Visit: Payer: Medicare Other

## 2017-02-07 ENCOUNTER — Telehealth: Payer: Self-pay | Admitting: *Deleted

## 2017-02-07 ENCOUNTER — Ambulatory Visit: Payer: Medicare Other | Admitting: Rehabilitative and Restorative Service Providers"

## 2017-02-07 NOTE — Telephone Encounter (Signed)
Spoke with [facility] on 02/06/2017 and confirmed patient is inpatient at their facility.  Her comfort is being managed by their facility.

## 2017-02-09 ENCOUNTER — Ambulatory Visit: Payer: Medicare Other | Admitting: Rehabilitative and Restorative Service Providers"

## 2017-02-10 ENCOUNTER — Ambulatory Visit: Admission: RE | Admit: 2017-02-10 | Payer: Medicare Other | Source: Ambulatory Visit | Admitting: Radiation Oncology

## 2017-02-13 ENCOUNTER — Encounter: Payer: Medicare Other | Admitting: Nutrition

## 2017-02-13 ENCOUNTER — Other Ambulatory Visit: Payer: Medicare Other

## 2017-02-13 ENCOUNTER — Ambulatory Visit: Payer: Medicare Other

## 2017-02-13 ENCOUNTER — Ambulatory Visit: Payer: Medicare Other | Admitting: Hematology and Oncology

## 2017-02-15 ENCOUNTER — Telehealth: Payer: Self-pay

## 2017-02-15 NOTE — Telephone Encounter (Signed)
Received call from pt sister, Judson Roch, to seek advise on what she needs to do about her sister. Judson Roch states that pt had her 2 week assessment at [facility], and pt condition at this time is stable and improved. Pt not in pain, no sob, but remains on bedrest. Pt sister was told that she may not be ready for [facility] and will need to think of transporting pt back to home vs snf. Judson Roch would like to know if there was any way for pt to have scans to make sure her clot is still there. Discussed with Dr.Gudena and he is suggesting that pt goes to a snf at this time, but sister wants to take her home, which is okay with MD. He doesn't feel scans are necessary at this time with hospice. If pt declines rather quickly when she returns home, then she may come back to [facility] for hospice management. Pt sister is feeling stressed but she states that she has support of her other sister and her husband. She had verbalized understanding and feels very overwhelmed at this time. Told pt to call if she has any other questions or concerns at this time.

## 2017-02-16 DIAGNOSIS — C3490 Malignant neoplasm of unspecified part of unspecified bronchus or lung: Secondary | ICD-10-CM | POA: Diagnosis not present

## 2017-03-06 ENCOUNTER — Ambulatory Visit: Payer: Medicare Other | Admitting: Hematology and Oncology

## 2017-03-06 ENCOUNTER — Other Ambulatory Visit: Payer: Medicare Other

## 2017-03-06 ENCOUNTER — Ambulatory Visit: Payer: Medicare Other

## 2017-03-07 ENCOUNTER — Telehealth: Payer: Self-pay

## 2017-03-07 NOTE — Telephone Encounter (Signed)
Received a call from [facility] that pt will be discharge to home on 03/09/17 and pt would like for Dr.Gudena to be her attending MD. Damaris Schooner with [facility] and lvm to let her know that was okay with Dr.Gudena.

## 2017-03-10 NOTE — Progress Notes (Signed)
Received a call from [place], hospice nurse that pt was sent back home from [facility] yesterday 03/09/17. Pt was in good spirits and is still bed bound. Nurse wanting to find out if pt to continue on 4mg  decadron BID and if pt is able to ambulate.  Told nurse that per Dr.Gudena, ok to continue decadron as ordered and to keep pt bedrest due to recent massive PE and location. Told nurse that pt may do some light exercise for strength while in bed. Verbalized understanding.

## 2017-03-13 ENCOUNTER — Other Ambulatory Visit: Payer: Self-pay

## 2017-03-13 ENCOUNTER — Telehealth: Payer: Self-pay

## 2017-03-13 DIAGNOSIS — C50412 Malignant neoplasm of upper-outer quadrant of left female breast: Secondary | ICD-10-CM

## 2017-03-13 MED ORDER — OXYCODONE-ACETAMINOPHEN 5-325 MG PO TABS: 1.0000 | ORAL_TABLET | Freq: Four times a day (QID) | ORAL | 0 refills | Status: DC | PRN

## 2017-03-13 MED ORDER — HYDROCODONE-ACETAMINOPHEN 5-325 MG PO TABS: 1.0000 | ORAL_TABLET | Freq: Four times a day (QID) | ORAL | Status: DC | PRN

## 2017-03-13 MED ORDER — OXYCODONE-ACETAMINOPHEN 5-325 MG PO TABS: 1.0000 | ORAL_TABLET | Freq: Four times a day (QID) | ORAL | 0 refills | Status: AC | PRN

## 2017-03-13 NOTE — Telephone Encounter (Signed)
Hospice RN requested pain medication for pt. Script will be faxed to patients pharmacy.  Cyndia Bent RN

## 2017-03-13 NOTE — Telephone Encounter (Signed)
Left VM for Hospice Triage RN regarding pain medication for Kelly Stuart. Verbal ok per Dr. Lindi Adie given. Direct line given.  Cyndia Bent RN

## 2017-03-20 ENCOUNTER — Telehealth: Payer: Self-pay

## 2017-03-20 NOTE — Telephone Encounter (Signed)
Sherry with hospice of [facility] called that pt died 07-Apr-2017 at 0410 am.

## 2017-03-25 DEATH — deceased

## 2017-03-27 ENCOUNTER — Other Ambulatory Visit: Payer: Medicare Other

## 2017-03-27 ENCOUNTER — Ambulatory Visit: Payer: Medicare Other | Admitting: Hematology and Oncology

## 2017-03-27 ENCOUNTER — Ambulatory Visit: Payer: Medicare Other

## 2017-03-29 ENCOUNTER — Other Ambulatory Visit: Payer: Self-pay | Admitting: Nurse Practitioner

## 2018-11-16 IMAGING — US US BIOPSY
1 series · 10 of 10 positions shown · non-contrast
Comparison: none

INDICATION: 74-year-old female with history of breast carcinoma.

[Series 1: us biopsy · 0.06mm/px · 10 of 10 slices shown]
[im 1/10]
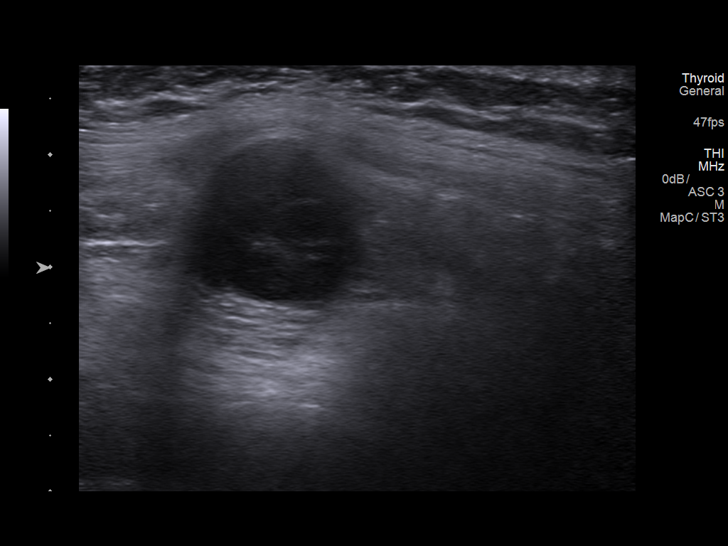
[im 2/10]
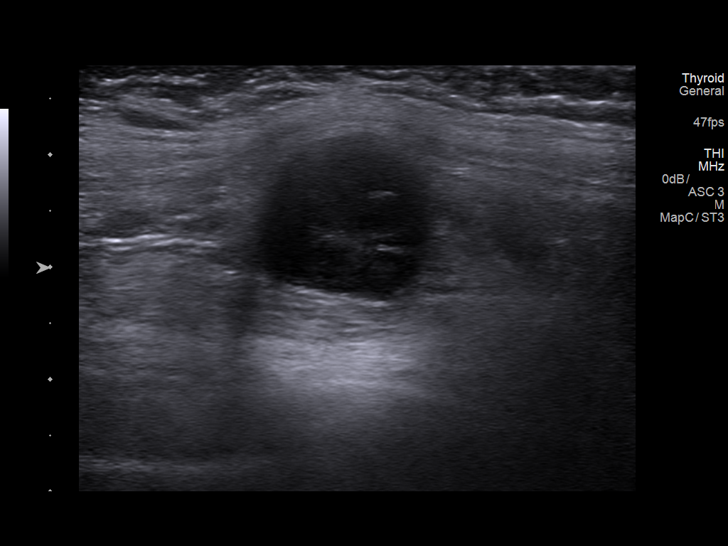
[im 3/10]
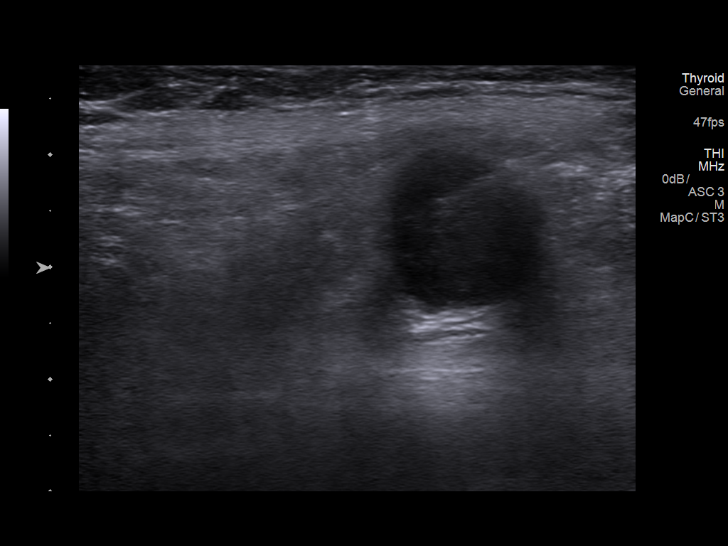
[im 4/10]
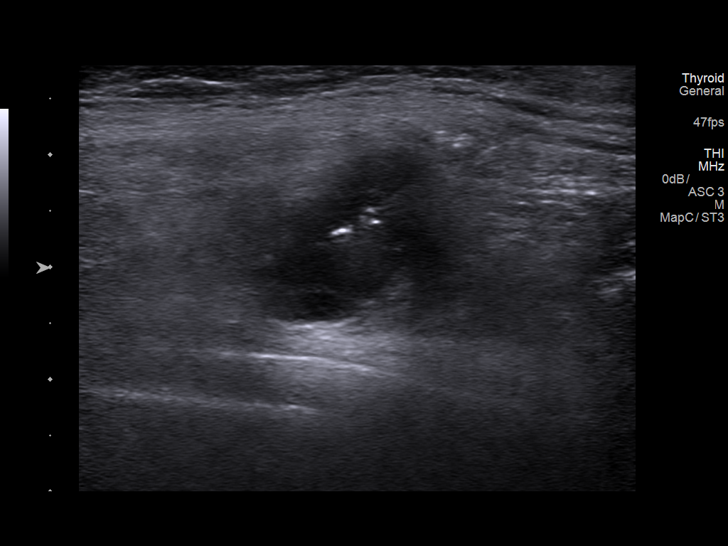
[im 5/10]
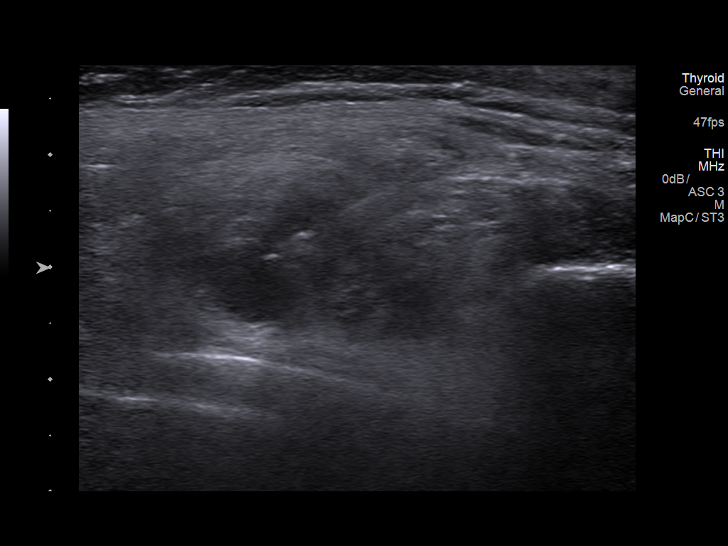
[im 6/10]
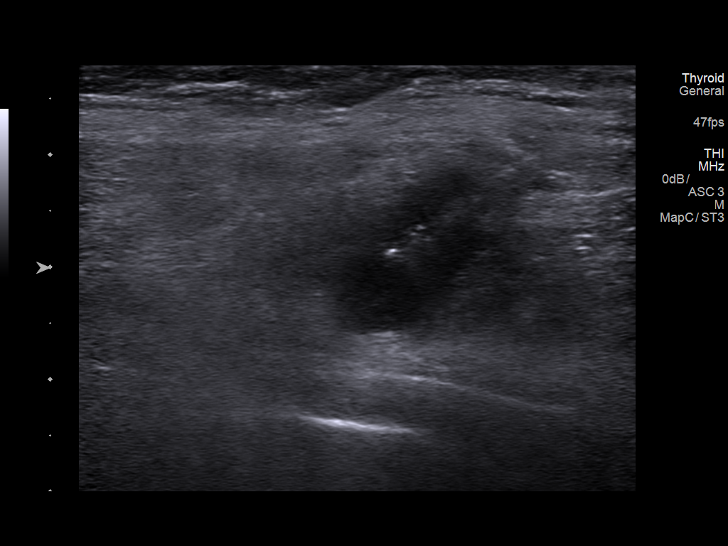
[im 7/10]
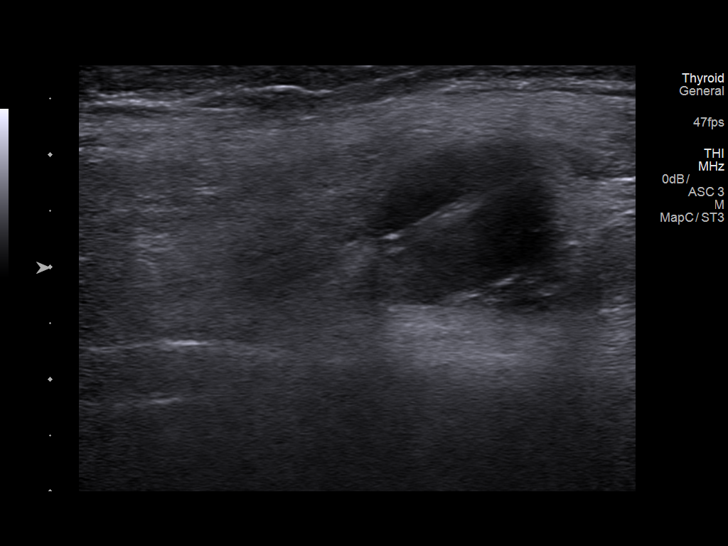
[im 8/10]
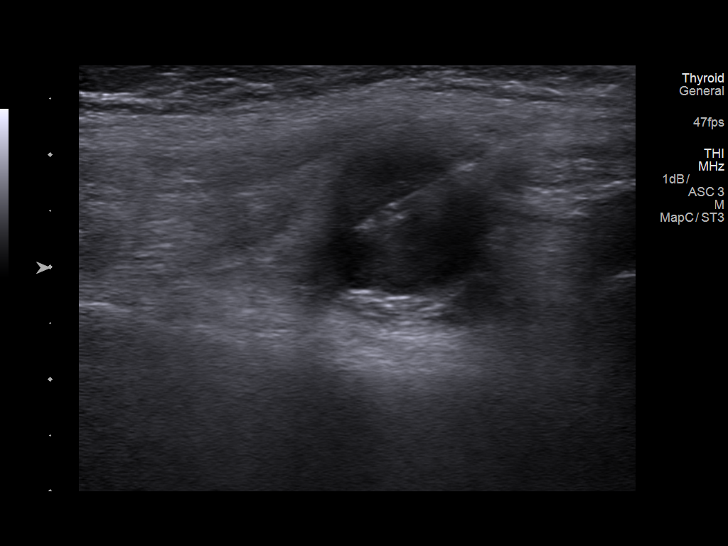
[im 9/10]
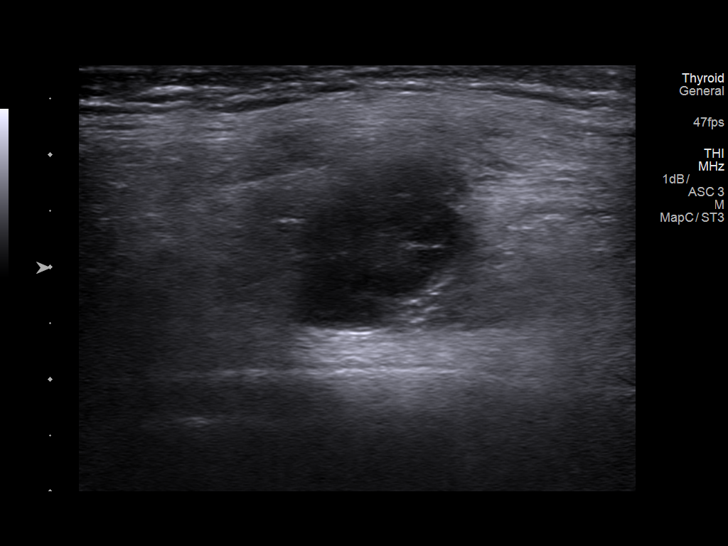
[im 10/10]
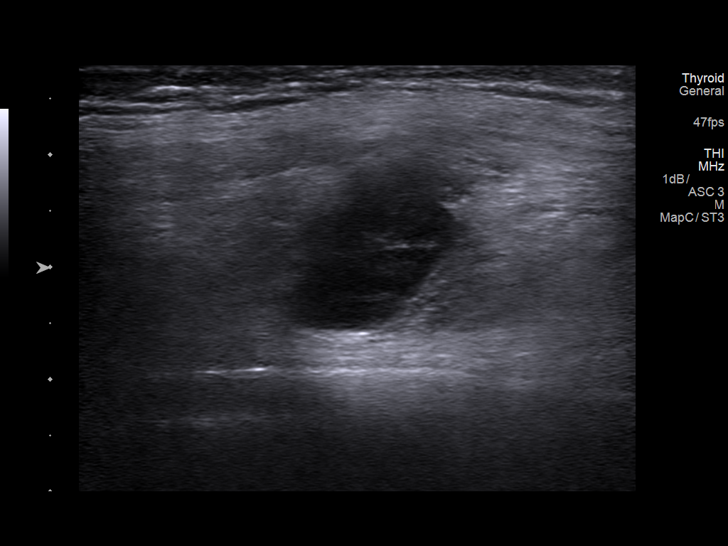

[10 of 10 positions shown; findings below may reference images not displayed]

FDG avid nodule of the left chest wall, with concern for metastases.

EXAM:
ULTRASOUND-GUIDED BIOPSY LEFT CHEST WALL NODULE

MEDICATIONS:
None.

ANESTHESIA/SEDATION:
None

FLUOROSCOPY TIME:  None

COMPLICATIONS:
None

PROCEDURE:
Informed written consent was obtained from the patient after a
thorough discussion of the procedural risks, benefits and
alternatives. All questions were addressed. Maximal Sterile Barrier
Technique was utilized including caps, mask, sterile gowns, sterile
gloves, sterile drape, hand hygiene and skin antiseptic. A timeout
was performed prior to the initiation of the procedure.

Patient positioned right decubitus on the ultrasound table.
Ultrasound images of left chest wall or performed with images stored
and sent to PACs.

The patient was then prepped and draped in the usual sterile
fashion. The skin and subcutaneous tissues were generously
infiltrated 1% lidocaine for local anesthesia. Using ultrasound
guidance, multiple 18 gauge core biopsy of chest wall lesion or
acquired. These are placed into formalin solution.

Final images were stored.

Patient tolerated the procedure well and remained hemodynamically
stable throughout.

No complications were encountered and no significant blood loss.
IMPRESSION: Status post ultrasound-guided biopsy of left chest wall lesion.
Tissue specimen sent to pathology for complete histopathologic
analysis.

## 2018-12-25 IMAGING — CT CT CHEST W/ CM
2 of 3 series · 14 of 30 positions shown, 17 images · IV contrast (75CC ISOVUE 300)
Comparison: Radiograph 10/24/2016.  No prior chest CT.

CLINICAL DATA: Pulmonary nodule on radiograph. Cough for 4 months.
History of breast cancer.

EXAM:
CT CHEST WITH CONTRAST
TECHNIQUE: Multidetector CT imaging of the chest was performed during
intravenous contrast administration.
CONTRAST:  75mL [id number] IOPAMIDOL ([id number]) INJECTION 61%
Creatinine was obtained on site at [HOSPITAL] at [REDACTED].
Results: Creatinine 0.6 mg/dL.

[Series 3: chest with · axial · 0.70mm/px · z∈[-264,+12]mm · 11 of 133 slices shown, 14 images]
[im 12/133  mediastinal]
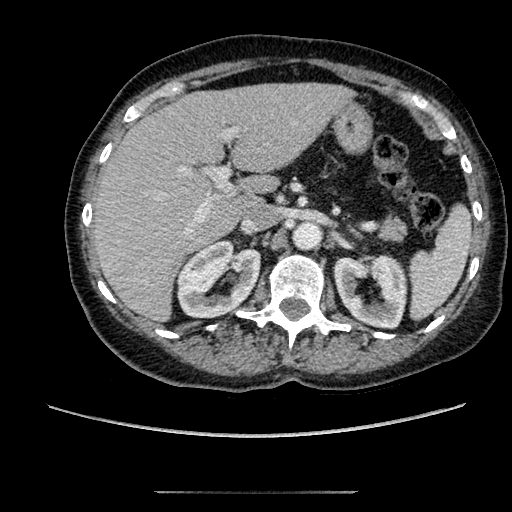
[im 12/133  lung]
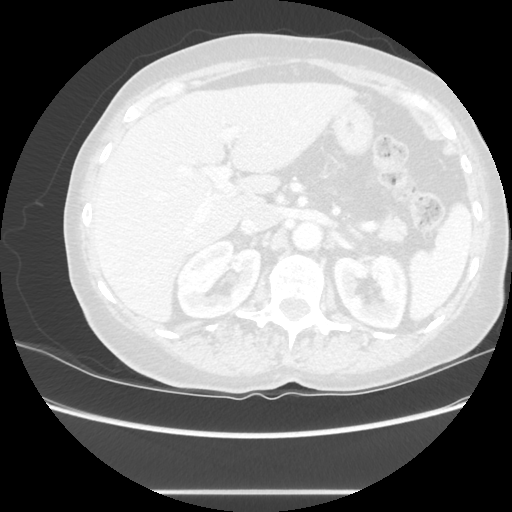
[im 23/133  lung]
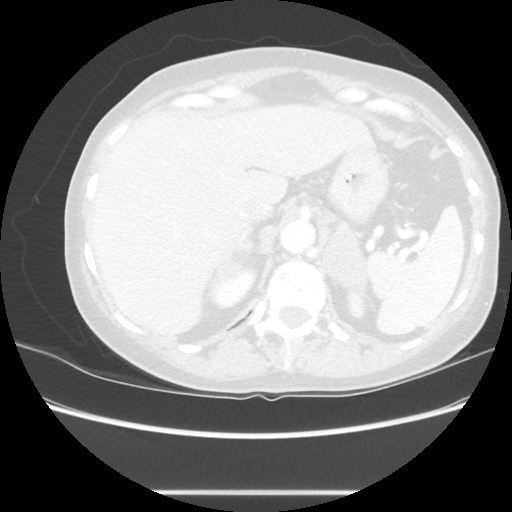
[im 34/133  lung]
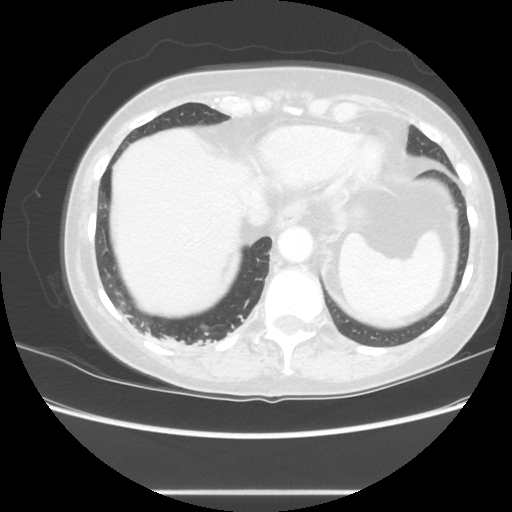
[im 45/133  lung]
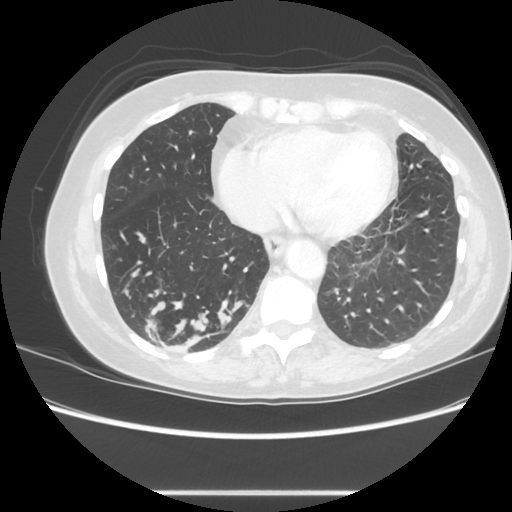
[im 56/133  mediastinal]
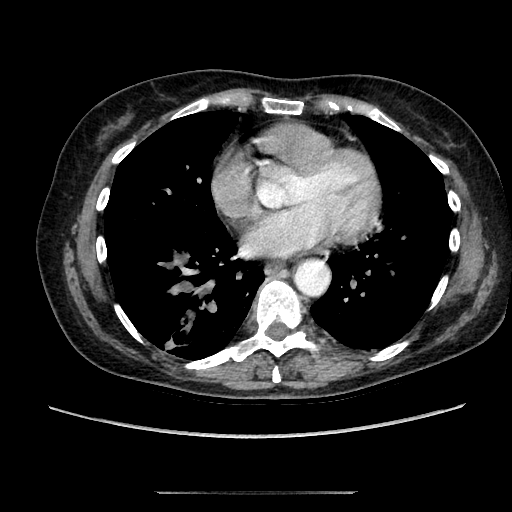
[im 56/133  lung]
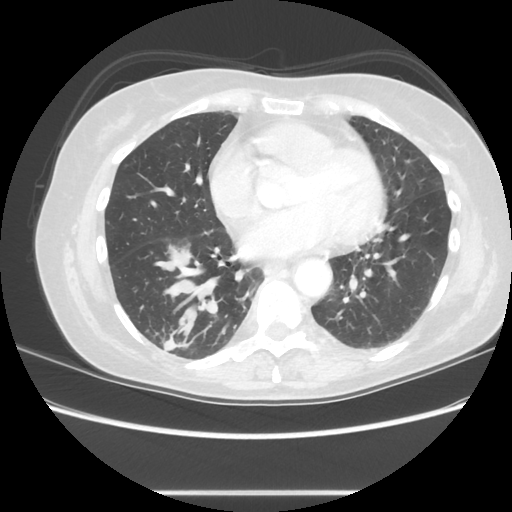
[im 67/133  lung]
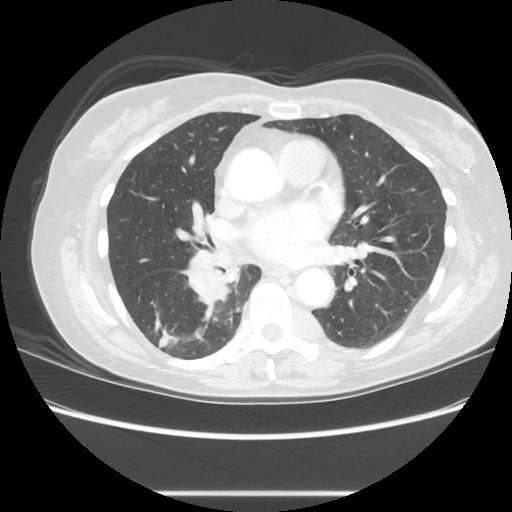
[im 78/133  lung]
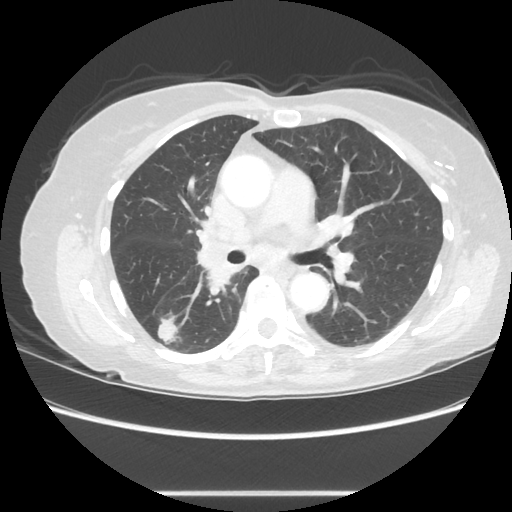
[im 89/133  lung]
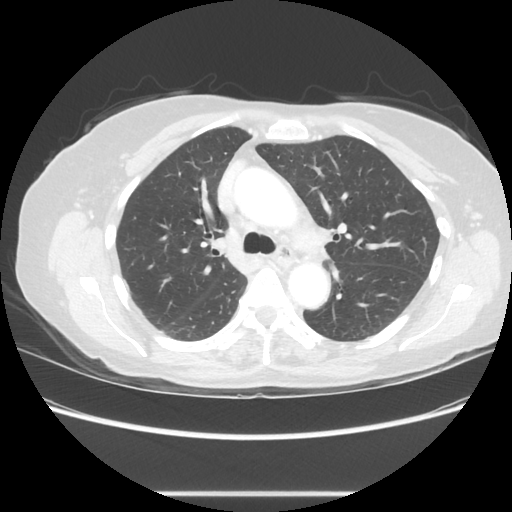
[im 100/133  mediastinal]
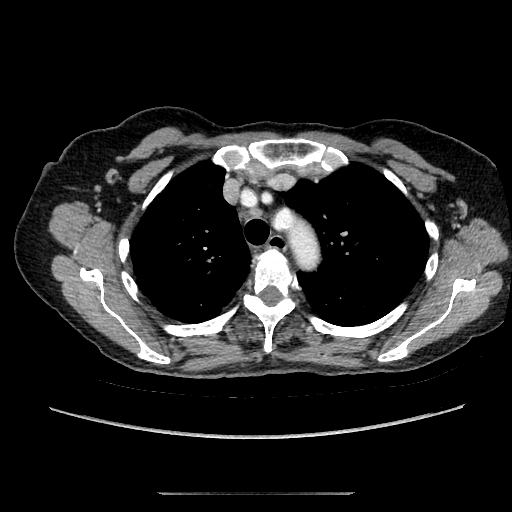
[im 100/133  lung]
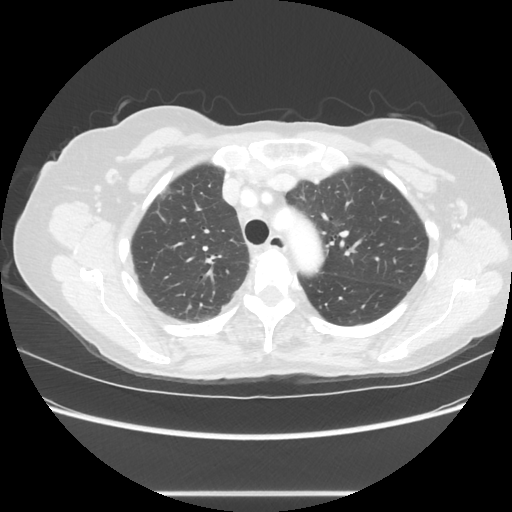
[im 111/133  lung]
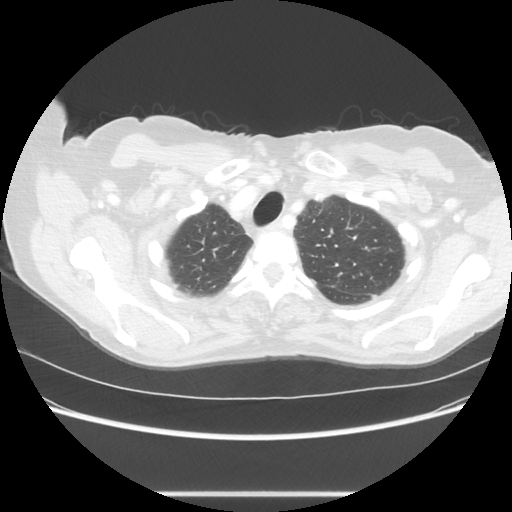
[im 122/133  lung]
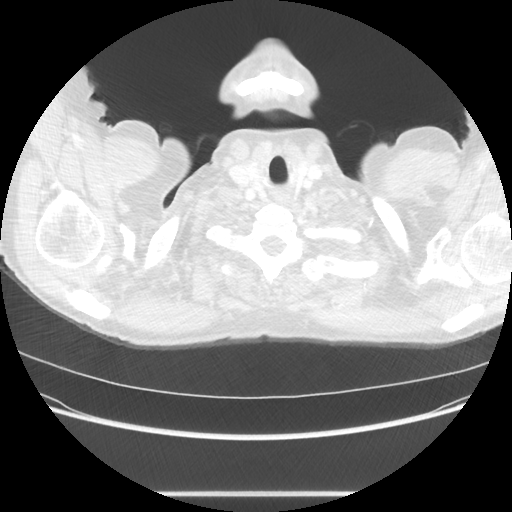

[Series 602: sagittal body · sagittal · 0.70mm/px · 3 of 145 slices shown]
[im 11/145  mediastinal]
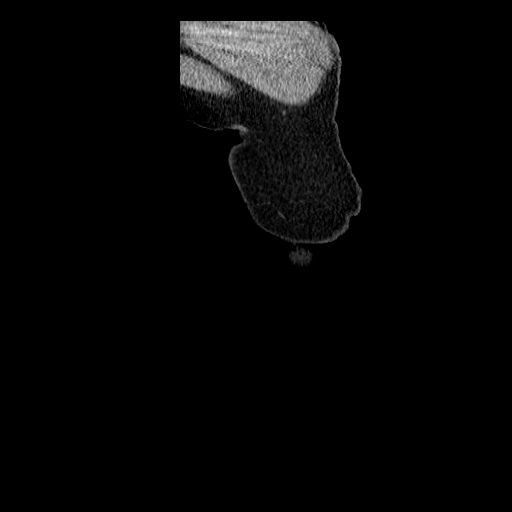
[im 31/145  mediastinal]
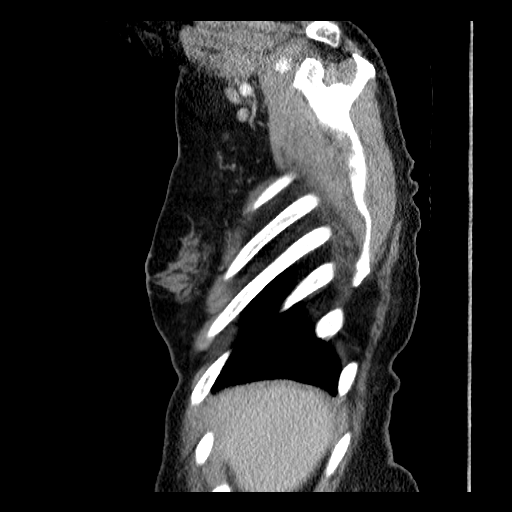
[im 52/145  mediastinal]
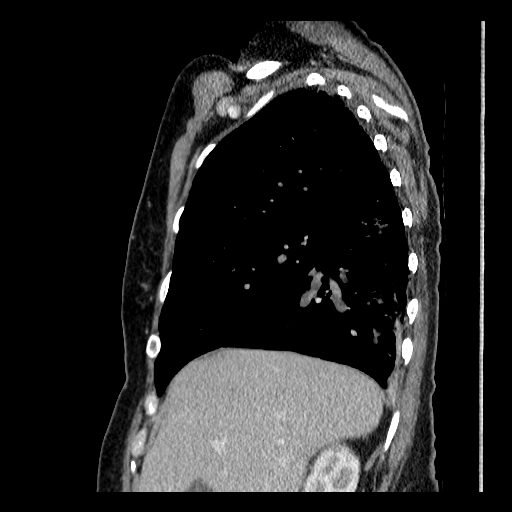

[14 of 30 positions shown; findings below may reference images not displayed]

FINDINGS: Cardiovascular: Mild ectasia of the ascending aorta, maximal
dimension 3.7 cm. There is aortic tortuosity. Heart is normal in
size.

Mediastinum/Nodes: Nodular soft tissue density at the right hilum
most consistent with adenopathy, versus central lung lesion. This
measures 4.1 x 2.7 cm inferiorly, 2.4 x 2.2 cm superiorly. There is
subcarinal adenopathy with nodes measuring 13 mm and 15mm short
axis. Additional smaller nodes at multiple mediastinal stations, for
example pretracheal node measures 9 mm, upper paratracheal node
measures 7 mm. No left hilar adenopathy. There is no axillary
adenopathy. Minimal symmetric soft tissue density about the upper
internal mammary vessels likely related to sternoclavicular joint
capsule. Surgical clips in the left axilla/lateral left breast.

Lungs/Pleura: Right perihilar soft tissue density causes mass effect
and partial occlusion of the right lower lobe bronchus with
bronchial narrowing. Tubular soft tissue density tracks in the
lateral right lower lobe filling the lower lobe bronchi. Nodular/
tubular soft tissue density extends to the pleural surface
posteriorly. There is a separate spiculated nodule in the superior
segment of the right lower lobe measuring 18 x 18 mm image 59 series
4. Irregular spiculations extend to the pleura posteriorly and
inferiorly to the diaphragmatic surface. Right hilar soft tissue
density also has mild mass-effect on the right middle lobe bronchus.
Small nodular and ground-glass opacities track along the tubuarl
densities in the right lower lobe. Ground-glass nodularity in the
right upper lobe anteriorly measuring 9 mm image 35 series 4.
Additional tiny nodules in the right upper lobe, for example image
40 and 42. No nodules in the left lung. There is no pleural fluid.

Upper Abdomen: Heterogeneous right adrenal mass measures 2.3 x
cm. Heterogeneous left adrenal mass measures 4.0 x 2.8 cm. Small
lymph nodes in the retroperitoneum, for example in 10 mm periaortic
node on the left. No focal hepatic lesion is seen.

Musculoskeletal: Lytic lesion involving the spinous process of T12,
does not extend to the spinal canal. Expansile lytic lesion
involving left posterior eighth rib at the costovertebral junction.
Suspect lytic lesion in left posterior third rib, not well-defined
by CT. No evidence of pathologic fracture.
IMPRESSION: 1. Spiculated 1.8 cm nodule in the superior segment of the right
lower lobe. Right hilar and mediastinal adenopathy. Right hilar soft
tissue density causes mass effect Awa the right lower and middle
lobe bronchi, with irregular tubular soft tissue density filling the
right lower lobe bronchi. Additional faint ground-glass nodularity
in punctate nodules in the right upper lobe. Findings are suspicious
for primary bronchogenic malignancy versus metastatic disease given
history of breast cancer.
2. Bilateral adrenal masses most consistent with metastatic disease.
3. Lytic lesions involving spinous process of T12, left post eighth
rib at the costovertebral junction and possibly left posterior third
rib consistent with metastatic disease.

These results will be called to the ordering clinician or
representative by the Radiologist Assistant, and communication
documented in the PACS or zVision Dashboard.
